# Patient Record
Sex: Male | Born: 1940
Health system: Southern US, Community
[De-identification: ages and names within clinical notes are randomized; demographics above are authoritative.]

## PROBLEM LIST (undated history)

## (undated) DIAGNOSIS — I219 Acute myocardial infarction, unspecified: Secondary | ICD-10-CM

## (undated) DIAGNOSIS — I639 Cerebral infarction, unspecified: Secondary | ICD-10-CM

## (undated) DIAGNOSIS — M109 Gout, unspecified: Secondary | ICD-10-CM

## (undated) DIAGNOSIS — N189 Chronic kidney disease, unspecified: Secondary | ICD-10-CM

## (undated) DIAGNOSIS — N3281 Overactive bladder: Secondary | ICD-10-CM

## (undated) DIAGNOSIS — G4730 Sleep apnea, unspecified: Secondary | ICD-10-CM

## (undated) DIAGNOSIS — I2510 Atherosclerotic heart disease of native coronary artery without angina pectoris: Secondary | ICD-10-CM

## (undated) DIAGNOSIS — I10 Essential (primary) hypertension: Secondary | ICD-10-CM

## (undated) DIAGNOSIS — E119 Type 2 diabetes mellitus without complications: Secondary | ICD-10-CM

## (undated) DIAGNOSIS — E785 Hyperlipidemia, unspecified: Secondary | ICD-10-CM

## (undated) DIAGNOSIS — M1990 Unspecified osteoarthritis, unspecified site: Secondary | ICD-10-CM

## (undated) DIAGNOSIS — I4892 Unspecified atrial flutter: Secondary | ICD-10-CM

## (undated) DIAGNOSIS — T8859XA Other complications of anesthesia, initial encounter: Secondary | ICD-10-CM

## (undated) DIAGNOSIS — F419 Anxiety disorder, unspecified: Secondary | ICD-10-CM

## (undated) DIAGNOSIS — C801 Malignant (primary) neoplasm, unspecified: Secondary | ICD-10-CM

## (undated) DIAGNOSIS — K598 Other specified functional intestinal disorders: Secondary | ICD-10-CM

## (undated) HISTORY — DX: Other specified functional intestinal disorders: K59.8

## (undated) HISTORY — PX: COLOSTOMY: SHX63

## (undated) HISTORY — DX: Overactive bladder: N32.81

## (undated) HISTORY — DX: Cerebral infarction, unspecified: I63.9

## (undated) HISTORY — DX: Essential (primary) hypertension: I10

## (undated) HISTORY — DX: Type 2 diabetes mellitus without complications: E11.9

## (undated) HISTORY — PX: CHOLECYSTECTOMY: SHX55

## (undated) HISTORY — DX: Hyperlipidemia, unspecified: E78.5

## (undated) HISTORY — DX: Sleep apnea, unspecified: G47.30

## (undated) HISTORY — DX: Unspecified osteoarthritis, unspecified site: M19.90

## (undated) HISTORY — DX: Gout, unspecified: M10.9

## (undated) HISTORY — DX: Malignant (primary) neoplasm, unspecified: C80.1

## (undated) HISTORY — DX: Anxiety disorder, unspecified: F41.9

## (undated) HISTORY — PX: PROSTATE SURGERY: SHX751

---

## 2016-03-05 DIAGNOSIS — M7061 Trochanteric bursitis, right hip: Secondary | ICD-10-CM | POA: Diagnosis not present

## 2016-03-05 DIAGNOSIS — M25551 Pain in right hip: Secondary | ICD-10-CM | POA: Diagnosis not present

## 2016-03-05 DIAGNOSIS — M25552 Pain in left hip: Secondary | ICD-10-CM | POA: Diagnosis not present

## 2016-03-05 DIAGNOSIS — Z96641 Presence of right artificial hip joint: Secondary | ICD-10-CM | POA: Diagnosis not present

## 2016-03-25 DIAGNOSIS — R05 Cough: Secondary | ICD-10-CM | POA: Diagnosis not present

## 2016-03-25 DIAGNOSIS — J209 Acute bronchitis, unspecified: Secondary | ICD-10-CM | POA: Diagnosis not present

## 2016-03-27 DIAGNOSIS — M25551 Pain in right hip: Secondary | ICD-10-CM | POA: Diagnosis not present

## 2016-03-27 DIAGNOSIS — M7062 Trochanteric bursitis, left hip: Secondary | ICD-10-CM | POA: Diagnosis not present

## 2016-03-27 DIAGNOSIS — M5441 Lumbago with sciatica, right side: Secondary | ICD-10-CM | POA: Diagnosis not present

## 2016-03-27 DIAGNOSIS — M25552 Pain in left hip: Secondary | ICD-10-CM | POA: Diagnosis not present

## 2016-04-10 DIAGNOSIS — E782 Mixed hyperlipidemia: Secondary | ICD-10-CM | POA: Diagnosis not present

## 2016-04-10 DIAGNOSIS — Z23 Encounter for immunization: Secondary | ICD-10-CM | POA: Diagnosis not present

## 2016-04-10 DIAGNOSIS — E119 Type 2 diabetes mellitus without complications: Secondary | ICD-10-CM | POA: Diagnosis not present

## 2016-04-10 DIAGNOSIS — M25552 Pain in left hip: Secondary | ICD-10-CM | POA: Diagnosis not present

## 2016-04-10 DIAGNOSIS — I10 Essential (primary) hypertension: Secondary | ICD-10-CM | POA: Diagnosis not present

## 2016-04-16 DIAGNOSIS — H2000 Unspecified acute and subacute iridocyclitis: Secondary | ICD-10-CM | POA: Diagnosis not present

## 2016-04-24 DIAGNOSIS — H2000 Unspecified acute and subacute iridocyclitis: Secondary | ICD-10-CM | POA: Diagnosis not present

## 2016-05-15 DIAGNOSIS — H2000 Unspecified acute and subacute iridocyclitis: Secondary | ICD-10-CM | POA: Diagnosis not present

## 2016-05-27 DIAGNOSIS — M1990 Unspecified osteoarthritis, unspecified site: Secondary | ICD-10-CM | POA: Diagnosis not present

## 2016-05-27 DIAGNOSIS — R609 Edema, unspecified: Secondary | ICD-10-CM | POA: Diagnosis not present

## 2016-05-28 DIAGNOSIS — R5383 Other fatigue: Secondary | ICD-10-CM | POA: Diagnosis not present

## 2016-05-28 DIAGNOSIS — D649 Anemia, unspecified: Secondary | ICD-10-CM | POA: Diagnosis not present

## 2016-05-29 DIAGNOSIS — H2000 Unspecified acute and subacute iridocyclitis: Secondary | ICD-10-CM | POA: Diagnosis not present

## 2016-06-04 DIAGNOSIS — M7061 Trochanteric bursitis, right hip: Secondary | ICD-10-CM | POA: Diagnosis not present

## 2016-06-04 DIAGNOSIS — M5441 Lumbago with sciatica, right side: Secondary | ICD-10-CM | POA: Diagnosis not present

## 2016-06-04 DIAGNOSIS — M25551 Pain in right hip: Secondary | ICD-10-CM | POA: Diagnosis not present

## 2016-06-04 DIAGNOSIS — G894 Chronic pain syndrome: Secondary | ICD-10-CM | POA: Diagnosis not present

## 2016-06-04 DIAGNOSIS — Z981 Arthrodesis status: Secondary | ICD-10-CM | POA: Diagnosis not present

## 2016-09-08 DIAGNOSIS — Z96641 Presence of right artificial hip joint: Secondary | ICD-10-CM | POA: Diagnosis not present

## 2016-09-08 DIAGNOSIS — M7062 Trochanteric bursitis, left hip: Secondary | ICD-10-CM | POA: Diagnosis not present

## 2016-09-08 DIAGNOSIS — Z96642 Presence of left artificial hip joint: Secondary | ICD-10-CM | POA: Diagnosis not present

## 2016-09-08 DIAGNOSIS — M25551 Pain in right hip: Secondary | ICD-10-CM | POA: Diagnosis not present

## 2016-09-08 DIAGNOSIS — M545 Low back pain: Secondary | ICD-10-CM | POA: Diagnosis not present

## 2016-09-08 DIAGNOSIS — G894 Chronic pain syndrome: Secondary | ICD-10-CM | POA: Diagnosis not present

## 2016-09-08 DIAGNOSIS — M25552 Pain in left hip: Secondary | ICD-10-CM | POA: Diagnosis not present

## 2016-09-08 DIAGNOSIS — M7061 Trochanteric bursitis, right hip: Secondary | ICD-10-CM | POA: Diagnosis not present

## 2016-10-10 DIAGNOSIS — M25552 Pain in left hip: Secondary | ICD-10-CM | POA: Diagnosis not present

## 2016-10-10 DIAGNOSIS — Z6830 Body mass index (BMI) 30.0-30.9, adult: Secondary | ICD-10-CM | POA: Diagnosis not present

## 2016-10-10 DIAGNOSIS — Z79899 Other long term (current) drug therapy: Secondary | ICD-10-CM | POA: Diagnosis not present

## 2016-10-10 DIAGNOSIS — M25551 Pain in right hip: Secondary | ICD-10-CM | POA: Diagnosis not present

## 2016-10-10 DIAGNOSIS — Z0000 Encounter for general adult medical examination without abnormal findings: Secondary | ICD-10-CM | POA: Diagnosis not present

## 2016-10-10 DIAGNOSIS — E876 Hypokalemia: Secondary | ICD-10-CM | POA: Diagnosis not present

## 2016-10-10 DIAGNOSIS — K219 Gastro-esophageal reflux disease without esophagitis: Secondary | ICD-10-CM | POA: Diagnosis not present

## 2016-10-10 DIAGNOSIS — M109 Gout, unspecified: Secondary | ICD-10-CM | POA: Diagnosis not present

## 2016-10-10 DIAGNOSIS — D508 Other iron deficiency anemias: Secondary | ICD-10-CM | POA: Diagnosis not present

## 2016-10-10 DIAGNOSIS — I10 Essential (primary) hypertension: Secondary | ICD-10-CM | POA: Diagnosis not present

## 2016-10-10 DIAGNOSIS — N3281 Overactive bladder: Secondary | ICD-10-CM | POA: Diagnosis not present

## 2016-10-10 DIAGNOSIS — Z125 Encounter for screening for malignant neoplasm of prostate: Secondary | ICD-10-CM | POA: Diagnosis not present

## 2016-10-10 DIAGNOSIS — E119 Type 2 diabetes mellitus without complications: Secondary | ICD-10-CM | POA: Diagnosis not present

## 2016-10-20 DIAGNOSIS — E785 Hyperlipidemia, unspecified: Secondary | ICD-10-CM | POA: Diagnosis not present

## 2016-10-20 DIAGNOSIS — R0602 Shortness of breath: Secondary | ICD-10-CM | POA: Diagnosis not present

## 2016-10-20 DIAGNOSIS — I10 Essential (primary) hypertension: Secondary | ICD-10-CM | POA: Diagnosis not present

## 2016-10-20 DIAGNOSIS — I6789 Other cerebrovascular disease: Secondary | ICD-10-CM | POA: Diagnosis not present

## 2016-11-04 DIAGNOSIS — Z933 Colostomy status: Secondary | ICD-10-CM | POA: Diagnosis not present

## 2016-11-04 DIAGNOSIS — Z1211 Encounter for screening for malignant neoplasm of colon: Secondary | ICD-10-CM | POA: Diagnosis not present

## 2016-11-04 DIAGNOSIS — R634 Abnormal weight loss: Secondary | ICD-10-CM | POA: Diagnosis not present

## 2016-11-04 DIAGNOSIS — I519 Heart disease, unspecified: Secondary | ICD-10-CM | POA: Diagnosis not present

## 2016-11-04 DIAGNOSIS — K219 Gastro-esophageal reflux disease without esophagitis: Secondary | ICD-10-CM | POA: Diagnosis not present

## 2016-11-04 DIAGNOSIS — Z8673 Personal history of transient ischemic attack (TIA), and cerebral infarction without residual deficits: Secondary | ICD-10-CM | POA: Diagnosis not present

## 2016-11-11 DIAGNOSIS — E785 Hyperlipidemia, unspecified: Secondary | ICD-10-CM | POA: Diagnosis not present

## 2016-11-11 DIAGNOSIS — Z01810 Encounter for preprocedural cardiovascular examination: Secondary | ICD-10-CM | POA: Diagnosis not present

## 2016-11-11 DIAGNOSIS — I6789 Other cerebrovascular disease: Secondary | ICD-10-CM | POA: Diagnosis not present

## 2016-11-11 DIAGNOSIS — I10 Essential (primary) hypertension: Secondary | ICD-10-CM | POA: Diagnosis not present

## 2016-11-18 DIAGNOSIS — Z79891 Long term (current) use of opiate analgesic: Secondary | ICD-10-CM | POA: Diagnosis not present

## 2016-11-25 DIAGNOSIS — I10 Essential (primary) hypertension: Secondary | ICD-10-CM | POA: Diagnosis not present

## 2016-11-25 DIAGNOSIS — E119 Type 2 diabetes mellitus without complications: Secondary | ICD-10-CM | POA: Diagnosis not present

## 2016-11-26 DIAGNOSIS — M4727 Other spondylosis with radiculopathy, lumbosacral region: Secondary | ICD-10-CM | POA: Diagnosis not present

## 2016-11-26 DIAGNOSIS — M48061 Spinal stenosis, lumbar region without neurogenic claudication: Secondary | ICD-10-CM | POA: Diagnosis not present

## 2016-11-26 DIAGNOSIS — M4316 Spondylolisthesis, lumbar region: Secondary | ICD-10-CM | POA: Diagnosis not present

## 2016-11-26 DIAGNOSIS — M4807 Spinal stenosis, lumbosacral region: Secondary | ICD-10-CM | POA: Diagnosis not present

## 2016-11-26 DIAGNOSIS — M5416 Radiculopathy, lumbar region: Secondary | ICD-10-CM | POA: Diagnosis not present

## 2016-11-26 DIAGNOSIS — M5127 Other intervertebral disc displacement, lumbosacral region: Secondary | ICD-10-CM | POA: Diagnosis not present

## 2016-11-28 DIAGNOSIS — Z23 Encounter for immunization: Secondary | ICD-10-CM | POA: Diagnosis not present

## 2016-12-09 DIAGNOSIS — C61 Malignant neoplasm of prostate: Secondary | ICD-10-CM | POA: Diagnosis not present

## 2016-12-09 DIAGNOSIS — M549 Dorsalgia, unspecified: Secondary | ICD-10-CM | POA: Diagnosis not present

## 2016-12-09 DIAGNOSIS — E119 Type 2 diabetes mellitus without complications: Secondary | ICD-10-CM | POA: Diagnosis not present

## 2016-12-09 DIAGNOSIS — M961 Postlaminectomy syndrome, not elsewhere classified: Secondary | ICD-10-CM | POA: Diagnosis not present

## 2016-12-09 DIAGNOSIS — M5416 Radiculopathy, lumbar region: Secondary | ICD-10-CM | POA: Diagnosis not present

## 2016-12-09 DIAGNOSIS — R030 Elevated blood-pressure reading, without diagnosis of hypertension: Secondary | ICD-10-CM | POA: Diagnosis not present

## 2016-12-11 DIAGNOSIS — I10 Essential (primary) hypertension: Secondary | ICD-10-CM | POA: Diagnosis not present

## 2016-12-11 DIAGNOSIS — E119 Type 2 diabetes mellitus without complications: Secondary | ICD-10-CM | POA: Diagnosis not present

## 2017-01-07 DIAGNOSIS — M4800 Spinal stenosis, site unspecified: Secondary | ICD-10-CM | POA: Diagnosis not present

## 2017-01-07 DIAGNOSIS — M47816 Spondylosis without myelopathy or radiculopathy, lumbar region: Secondary | ICD-10-CM | POA: Diagnosis not present

## 2017-01-07 DIAGNOSIS — Z981 Arthrodesis status: Secondary | ICD-10-CM | POA: Diagnosis not present

## 2017-01-12 DIAGNOSIS — E119 Type 2 diabetes mellitus without complications: Secondary | ICD-10-CM | POA: Diagnosis not present

## 2017-01-12 DIAGNOSIS — I10 Essential (primary) hypertension: Secondary | ICD-10-CM | POA: Diagnosis not present

## 2017-01-22 DIAGNOSIS — M25512 Pain in left shoulder: Secondary | ICD-10-CM | POA: Diagnosis not present

## 2017-01-22 DIAGNOSIS — F419 Anxiety disorder, unspecified: Secondary | ICD-10-CM | POA: Diagnosis not present

## 2017-01-22 DIAGNOSIS — R350 Frequency of micturition: Secondary | ICD-10-CM | POA: Diagnosis not present

## 2017-01-22 DIAGNOSIS — Z6830 Body mass index (BMI) 30.0-30.9, adult: Secondary | ICD-10-CM | POA: Diagnosis not present

## 2017-01-22 DIAGNOSIS — N3281 Overactive bladder: Secondary | ICD-10-CM | POA: Diagnosis not present

## 2017-01-22 DIAGNOSIS — M25531 Pain in right wrist: Secondary | ICD-10-CM | POA: Diagnosis not present

## 2017-01-22 DIAGNOSIS — M79602 Pain in left arm: Secondary | ICD-10-CM | POA: Diagnosis not present

## 2017-01-22 DIAGNOSIS — Y92410 Unspecified street and highway as the place of occurrence of the external cause: Secondary | ICD-10-CM | POA: Diagnosis not present

## 2017-01-23 DIAGNOSIS — E785 Hyperlipidemia, unspecified: Secondary | ICD-10-CM | POA: Diagnosis not present

## 2017-01-23 DIAGNOSIS — I10 Essential (primary) hypertension: Secondary | ICD-10-CM | POA: Diagnosis not present

## 2017-01-23 DIAGNOSIS — Z01810 Encounter for preprocedural cardiovascular examination: Secondary | ICD-10-CM | POA: Diagnosis not present

## 2017-01-23 DIAGNOSIS — I6789 Other cerebrovascular disease: Secondary | ICD-10-CM | POA: Diagnosis not present

## 2017-02-18 DIAGNOSIS — E119 Type 2 diabetes mellitus without complications: Secondary | ICD-10-CM | POA: Diagnosis not present

## 2017-02-18 DIAGNOSIS — I10 Essential (primary) hypertension: Secondary | ICD-10-CM | POA: Diagnosis not present

## 2017-03-17 DIAGNOSIS — R030 Elevated blood-pressure reading, without diagnosis of hypertension: Secondary | ICD-10-CM | POA: Diagnosis not present

## 2017-03-17 DIAGNOSIS — M47816 Spondylosis without myelopathy or radiculopathy, lumbar region: Secondary | ICD-10-CM | POA: Diagnosis not present

## 2017-03-17 DIAGNOSIS — E119 Type 2 diabetes mellitus without complications: Secondary | ICD-10-CM | POA: Diagnosis not present

## 2017-03-17 DIAGNOSIS — C61 Malignant neoplasm of prostate: Secondary | ICD-10-CM | POA: Diagnosis not present

## 2017-04-01 DIAGNOSIS — E119 Type 2 diabetes mellitus without complications: Secondary | ICD-10-CM | POA: Diagnosis not present

## 2017-04-01 DIAGNOSIS — I10 Essential (primary) hypertension: Secondary | ICD-10-CM | POA: Diagnosis not present

## 2017-04-02 DIAGNOSIS — M1712 Unilateral primary osteoarthritis, left knee: Secondary | ICD-10-CM | POA: Diagnosis not present

## 2017-04-02 DIAGNOSIS — M25561 Pain in right knee: Secondary | ICD-10-CM | POA: Diagnosis not present

## 2017-04-02 DIAGNOSIS — M47816 Spondylosis without myelopathy or radiculopathy, lumbar region: Secondary | ICD-10-CM | POA: Diagnosis not present

## 2017-04-02 DIAGNOSIS — I10 Essential (primary) hypertension: Secondary | ICD-10-CM | POA: Diagnosis not present

## 2017-04-02 DIAGNOSIS — E119 Type 2 diabetes mellitus without complications: Secondary | ICD-10-CM | POA: Diagnosis not present

## 2017-04-02 DIAGNOSIS — M1990 Unspecified osteoarthritis, unspecified site: Secondary | ICD-10-CM | POA: Diagnosis not present

## 2017-04-02 DIAGNOSIS — M25562 Pain in left knee: Secondary | ICD-10-CM | POA: Diagnosis not present

## 2017-04-16 DIAGNOSIS — E119 Type 2 diabetes mellitus without complications: Secondary | ICD-10-CM | POA: Diagnosis not present

## 2017-04-16 DIAGNOSIS — M1990 Unspecified osteoarthritis, unspecified site: Secondary | ICD-10-CM | POA: Diagnosis not present

## 2017-04-16 DIAGNOSIS — M47817 Spondylosis without myelopathy or radiculopathy, lumbosacral region: Secondary | ICD-10-CM | POA: Diagnosis not present

## 2017-04-16 DIAGNOSIS — I639 Cerebral infarction, unspecified: Secondary | ICD-10-CM | POA: Diagnosis not present

## 2017-04-16 DIAGNOSIS — M47816 Spondylosis without myelopathy or radiculopathy, lumbar region: Secondary | ICD-10-CM | POA: Diagnosis not present

## 2017-04-17 DIAGNOSIS — M1A09X0 Idiopathic chronic gout, multiple sites, without tophus (tophi): Secondary | ICD-10-CM | POA: Diagnosis not present

## 2017-04-17 DIAGNOSIS — K219 Gastro-esophageal reflux disease without esophagitis: Secondary | ICD-10-CM | POA: Diagnosis not present

## 2017-04-17 DIAGNOSIS — I7090 Unspecified atherosclerosis: Secondary | ICD-10-CM | POA: Diagnosis not present

## 2017-04-17 DIAGNOSIS — E782 Mixed hyperlipidemia: Secondary | ICD-10-CM | POA: Diagnosis not present

## 2017-04-17 DIAGNOSIS — Z7902 Long term (current) use of antithrombotics/antiplatelets: Secondary | ICD-10-CM | POA: Diagnosis not present

## 2017-04-17 DIAGNOSIS — D508 Other iron deficiency anemias: Secondary | ICD-10-CM | POA: Diagnosis not present

## 2017-04-17 DIAGNOSIS — G629 Polyneuropathy, unspecified: Secondary | ICD-10-CM | POA: Diagnosis not present

## 2017-04-17 DIAGNOSIS — Z6830 Body mass index (BMI) 30.0-30.9, adult: Secondary | ICD-10-CM | POA: Diagnosis not present

## 2017-04-17 DIAGNOSIS — I25118 Atherosclerotic heart disease of native coronary artery with other forms of angina pectoris: Secondary | ICD-10-CM | POA: Diagnosis not present

## 2017-04-17 DIAGNOSIS — E119 Type 2 diabetes mellitus without complications: Secondary | ICD-10-CM | POA: Diagnosis not present

## 2017-04-17 DIAGNOSIS — I10 Essential (primary) hypertension: Secondary | ICD-10-CM | POA: Diagnosis not present

## 2017-04-17 DIAGNOSIS — N3281 Overactive bladder: Secondary | ICD-10-CM | POA: Diagnosis not present

## 2017-04-22 DIAGNOSIS — E119 Type 2 diabetes mellitus without complications: Secondary | ICD-10-CM | POA: Diagnosis not present

## 2017-04-22 DIAGNOSIS — I10 Essential (primary) hypertension: Secondary | ICD-10-CM | POA: Diagnosis not present

## 2017-04-24 DIAGNOSIS — E1165 Type 2 diabetes mellitus with hyperglycemia: Secondary | ICD-10-CM | POA: Diagnosis not present

## 2017-04-24 DIAGNOSIS — I10 Essential (primary) hypertension: Secondary | ICD-10-CM | POA: Diagnosis not present

## 2017-04-24 DIAGNOSIS — I25118 Atherosclerotic heart disease of native coronary artery with other forms of angina pectoris: Secondary | ICD-10-CM | POA: Diagnosis not present

## 2017-04-24 DIAGNOSIS — D508 Other iron deficiency anemias: Secondary | ICD-10-CM | POA: Diagnosis not present

## 2017-04-24 DIAGNOSIS — E1142 Type 2 diabetes mellitus with diabetic polyneuropathy: Secondary | ICD-10-CM | POA: Diagnosis not present

## 2017-04-24 DIAGNOSIS — N182 Chronic kidney disease, stage 2 (mild): Secondary | ICD-10-CM | POA: Diagnosis not present

## 2017-04-24 DIAGNOSIS — Z13820 Encounter for screening for osteoporosis: Secondary | ICD-10-CM | POA: Diagnosis not present

## 2017-04-24 DIAGNOSIS — Z6831 Body mass index (BMI) 31.0-31.9, adult: Secondary | ICD-10-CM | POA: Diagnosis not present

## 2017-04-24 DIAGNOSIS — M109 Gout, unspecified: Secondary | ICD-10-CM | POA: Diagnosis not present

## 2017-04-24 DIAGNOSIS — E1159 Type 2 diabetes mellitus with other circulatory complications: Secondary | ICD-10-CM | POA: Diagnosis not present

## 2017-04-24 DIAGNOSIS — F419 Anxiety disorder, unspecified: Secondary | ICD-10-CM | POA: Diagnosis not present

## 2017-04-24 DIAGNOSIS — E1122 Type 2 diabetes mellitus with diabetic chronic kidney disease: Secondary | ICD-10-CM | POA: Diagnosis not present

## 2017-05-05 DIAGNOSIS — E1165 Type 2 diabetes mellitus with hyperglycemia: Secondary | ICD-10-CM | POA: Diagnosis not present

## 2017-05-06 DIAGNOSIS — Z96643 Presence of artificial hip joint, bilateral: Secondary | ICD-10-CM | POA: Diagnosis not present

## 2017-05-06 DIAGNOSIS — M25551 Pain in right hip: Secondary | ICD-10-CM | POA: Diagnosis not present

## 2017-05-06 DIAGNOSIS — M25552 Pain in left hip: Secondary | ICD-10-CM | POA: Diagnosis not present

## 2017-05-15 DIAGNOSIS — I10 Essential (primary) hypertension: Secondary | ICD-10-CM | POA: Diagnosis not present

## 2017-05-15 DIAGNOSIS — E119 Type 2 diabetes mellitus without complications: Secondary | ICD-10-CM | POA: Diagnosis not present

## 2017-05-20 DIAGNOSIS — M79642 Pain in left hand: Secondary | ICD-10-CM | POA: Diagnosis not present

## 2017-05-20 DIAGNOSIS — I7090 Unspecified atherosclerosis: Secondary | ICD-10-CM | POA: Diagnosis not present

## 2017-05-20 DIAGNOSIS — Z6831 Body mass index (BMI) 31.0-31.9, adult: Secondary | ICD-10-CM | POA: Diagnosis not present

## 2017-05-20 DIAGNOSIS — M79605 Pain in left leg: Secondary | ICD-10-CM | POA: Diagnosis not present

## 2017-05-20 DIAGNOSIS — Z96643 Presence of artificial hip joint, bilateral: Secondary | ICD-10-CM | POA: Diagnosis not present

## 2017-05-20 DIAGNOSIS — M79604 Pain in right leg: Secondary | ICD-10-CM | POA: Diagnosis not present

## 2017-05-20 DIAGNOSIS — R2243 Localized swelling, mass and lump, lower limb, bilateral: Secondary | ICD-10-CM | POA: Diagnosis not present

## 2017-05-20 DIAGNOSIS — E1122 Type 2 diabetes mellitus with diabetic chronic kidney disease: Secondary | ICD-10-CM | POA: Diagnosis not present

## 2017-06-01 DIAGNOSIS — R2243 Localized swelling, mass and lump, lower limb, bilateral: Secondary | ICD-10-CM | POA: Diagnosis not present

## 2017-06-05 DIAGNOSIS — M79662 Pain in left lower leg: Secondary | ICD-10-CM | POA: Diagnosis not present

## 2017-06-05 DIAGNOSIS — R2243 Localized swelling, mass and lump, lower limb, bilateral: Secondary | ICD-10-CM | POA: Diagnosis not present

## 2017-06-16 DIAGNOSIS — R600 Localized edema: Secondary | ICD-10-CM | POA: Diagnosis not present

## 2017-06-17 DIAGNOSIS — E119 Type 2 diabetes mellitus without complications: Secondary | ICD-10-CM | POA: Diagnosis not present

## 2017-06-17 DIAGNOSIS — I10 Essential (primary) hypertension: Secondary | ICD-10-CM | POA: Diagnosis not present

## 2017-06-18 DIAGNOSIS — R2243 Localized swelling, mass and lump, lower limb, bilateral: Secondary | ICD-10-CM | POA: Diagnosis not present

## 2017-06-18 DIAGNOSIS — M79662 Pain in left lower leg: Secondary | ICD-10-CM | POA: Diagnosis not present

## 2017-06-18 DIAGNOSIS — I872 Venous insufficiency (chronic) (peripheral): Secondary | ICD-10-CM | POA: Diagnosis not present

## 2017-06-22 ENCOUNTER — Encounter: Payer: Self-pay | Admitting: Physician Assistant

## 2017-06-22 DIAGNOSIS — Z135 Encounter for screening for eye and ear disorders: Secondary | ICD-10-CM | POA: Diagnosis not present

## 2017-06-22 DIAGNOSIS — E119 Type 2 diabetes mellitus without complications: Secondary | ICD-10-CM | POA: Diagnosis not present

## 2017-06-22 DIAGNOSIS — Z13820 Encounter for screening for osteoporosis: Secondary | ICD-10-CM | POA: Diagnosis not present

## 2017-06-30 DIAGNOSIS — M1711 Unilateral primary osteoarthritis, right knee: Secondary | ICD-10-CM | POA: Diagnosis not present

## 2017-07-01 DIAGNOSIS — Z96642 Presence of left artificial hip joint: Secondary | ICD-10-CM | POA: Diagnosis not present

## 2017-07-01 DIAGNOSIS — M25561 Pain in right knee: Secondary | ICD-10-CM | POA: Diagnosis not present

## 2017-07-01 DIAGNOSIS — M25562 Pain in left knee: Secondary | ICD-10-CM | POA: Diagnosis not present

## 2017-07-01 DIAGNOSIS — Z96641 Presence of right artificial hip joint: Secondary | ICD-10-CM | POA: Diagnosis not present

## 2017-07-07 DIAGNOSIS — M1712 Unilateral primary osteoarthritis, left knee: Secondary | ICD-10-CM | POA: Diagnosis not present

## 2017-07-14 DIAGNOSIS — M1711 Unilateral primary osteoarthritis, right knee: Secondary | ICD-10-CM | POA: Diagnosis not present

## 2017-07-21 DIAGNOSIS — E119 Type 2 diabetes mellitus without complications: Secondary | ICD-10-CM | POA: Diagnosis not present

## 2017-07-21 DIAGNOSIS — I10 Essential (primary) hypertension: Secondary | ICD-10-CM | POA: Diagnosis not present

## 2017-07-28 DIAGNOSIS — M1712 Unilateral primary osteoarthritis, left knee: Secondary | ICD-10-CM | POA: Diagnosis not present

## 2017-07-31 DIAGNOSIS — E785 Hyperlipidemia, unspecified: Secondary | ICD-10-CM | POA: Diagnosis not present

## 2017-07-31 DIAGNOSIS — I10 Essential (primary) hypertension: Secondary | ICD-10-CM | POA: Diagnosis not present

## 2017-07-31 DIAGNOSIS — I872 Venous insufficiency (chronic) (peripheral): Secondary | ICD-10-CM | POA: Diagnosis not present

## 2017-07-31 DIAGNOSIS — I6789 Other cerebrovascular disease: Secondary | ICD-10-CM | POA: Diagnosis not present

## 2017-08-04 DIAGNOSIS — M1711 Unilateral primary osteoarthritis, right knee: Secondary | ICD-10-CM | POA: Diagnosis not present

## 2017-08-17 DIAGNOSIS — M1712 Unilateral primary osteoarthritis, left knee: Secondary | ICD-10-CM | POA: Diagnosis not present

## 2017-08-18 DIAGNOSIS — I10 Essential (primary) hypertension: Secondary | ICD-10-CM | POA: Diagnosis not present

## 2017-08-18 DIAGNOSIS — E119 Type 2 diabetes mellitus without complications: Secondary | ICD-10-CM | POA: Diagnosis not present

## 2017-08-24 DIAGNOSIS — M1712 Unilateral primary osteoarthritis, left knee: Secondary | ICD-10-CM | POA: Diagnosis not present

## 2017-08-31 DIAGNOSIS — M1712 Unilateral primary osteoarthritis, left knee: Secondary | ICD-10-CM | POA: Diagnosis not present

## 2017-09-15 DIAGNOSIS — M1712 Unilateral primary osteoarthritis, left knee: Secondary | ICD-10-CM | POA: Diagnosis not present

## 2017-09-22 DIAGNOSIS — M1712 Unilateral primary osteoarthritis, left knee: Secondary | ICD-10-CM | POA: Diagnosis not present

## 2017-09-25 DIAGNOSIS — I10 Essential (primary) hypertension: Secondary | ICD-10-CM | POA: Diagnosis not present

## 2017-09-25 DIAGNOSIS — E119 Type 2 diabetes mellitus without complications: Secondary | ICD-10-CM | POA: Diagnosis not present

## 2017-10-08 DIAGNOSIS — E119 Type 2 diabetes mellitus without complications: Secondary | ICD-10-CM | POA: Diagnosis not present

## 2017-10-08 DIAGNOSIS — M5417 Radiculopathy, lumbosacral region: Secondary | ICD-10-CM | POA: Diagnosis not present

## 2017-10-08 DIAGNOSIS — M5416 Radiculopathy, lumbar region: Secondary | ICD-10-CM | POA: Diagnosis not present

## 2017-10-08 DIAGNOSIS — M961 Postlaminectomy syndrome, not elsewhere classified: Secondary | ICD-10-CM | POA: Diagnosis not present

## 2017-10-14 DIAGNOSIS — Z23 Encounter for immunization: Secondary | ICD-10-CM | POA: Diagnosis not present

## 2017-10-19 DIAGNOSIS — Z6830 Body mass index (BMI) 30.0-30.9, adult: Secondary | ICD-10-CM | POA: Diagnosis not present

## 2017-10-19 DIAGNOSIS — I10 Essential (primary) hypertension: Secondary | ICD-10-CM | POA: Diagnosis not present

## 2017-10-19 DIAGNOSIS — M62830 Muscle spasm of back: Secondary | ICD-10-CM | POA: Diagnosis not present

## 2017-10-19 DIAGNOSIS — Z0000 Encounter for general adult medical examination without abnormal findings: Secondary | ICD-10-CM | POA: Diagnosis not present

## 2017-10-19 DIAGNOSIS — M1A09X0 Idiopathic chronic gout, multiple sites, without tophus (tophi): Secondary | ICD-10-CM | POA: Diagnosis not present

## 2017-10-19 DIAGNOSIS — Z1211 Encounter for screening for malignant neoplasm of colon: Secondary | ICD-10-CM | POA: Diagnosis not present

## 2017-10-19 DIAGNOSIS — G629 Polyneuropathy, unspecified: Secondary | ICD-10-CM | POA: Diagnosis not present

## 2017-10-19 DIAGNOSIS — K219 Gastro-esophageal reflux disease without esophagitis: Secondary | ICD-10-CM | POA: Diagnosis not present

## 2017-10-19 DIAGNOSIS — Z79899 Other long term (current) drug therapy: Secondary | ICD-10-CM | POA: Diagnosis not present

## 2017-10-19 DIAGNOSIS — E1165 Type 2 diabetes mellitus with hyperglycemia: Secondary | ICD-10-CM | POA: Diagnosis not present

## 2017-10-19 DIAGNOSIS — D508 Other iron deficiency anemias: Secondary | ICD-10-CM | POA: Diagnosis not present

## 2017-10-19 DIAGNOSIS — E782 Mixed hyperlipidemia: Secondary | ICD-10-CM | POA: Diagnosis not present

## 2017-10-20 DIAGNOSIS — E669 Obesity, unspecified: Secondary | ICD-10-CM | POA: Diagnosis not present

## 2017-10-20 DIAGNOSIS — M533 Sacrococcygeal disorders, not elsewhere classified: Secondary | ICD-10-CM | POA: Diagnosis not present

## 2017-10-20 DIAGNOSIS — E119 Type 2 diabetes mellitus without complications: Secondary | ICD-10-CM | POA: Diagnosis not present

## 2017-10-20 DIAGNOSIS — M461 Sacroiliitis, not elsewhere classified: Secondary | ICD-10-CM | POA: Diagnosis not present

## 2017-10-30 DIAGNOSIS — I10 Essential (primary) hypertension: Secondary | ICD-10-CM | POA: Diagnosis not present

## 2017-10-30 DIAGNOSIS — E119 Type 2 diabetes mellitus without complications: Secondary | ICD-10-CM | POA: Diagnosis not present

## 2017-11-06 DIAGNOSIS — M47817 Spondylosis without myelopathy or radiculopathy, lumbosacral region: Secondary | ICD-10-CM | POA: Diagnosis not present

## 2017-11-06 DIAGNOSIS — M47816 Spondylosis without myelopathy or radiculopathy, lumbar region: Secondary | ICD-10-CM | POA: Diagnosis not present

## 2017-11-20 DIAGNOSIS — M47816 Spondylosis without myelopathy or radiculopathy, lumbar region: Secondary | ICD-10-CM | POA: Diagnosis not present

## 2017-11-20 DIAGNOSIS — M47817 Spondylosis without myelopathy or radiculopathy, lumbosacral region: Secondary | ICD-10-CM | POA: Diagnosis not present

## 2017-11-26 DIAGNOSIS — E119 Type 2 diabetes mellitus without complications: Secondary | ICD-10-CM | POA: Diagnosis not present

## 2017-11-26 DIAGNOSIS — I10 Essential (primary) hypertension: Secondary | ICD-10-CM | POA: Diagnosis not present

## 2017-12-15 ENCOUNTER — Ambulatory Visit (INDEPENDENT_AMBULATORY_CARE_PROVIDER_SITE_OTHER): Payer: Medicare Other

## 2017-12-15 ENCOUNTER — Encounter: Payer: Self-pay | Admitting: Sports Medicine

## 2017-12-15 ENCOUNTER — Ambulatory Visit (INDEPENDENT_AMBULATORY_CARE_PROVIDER_SITE_OTHER): Payer: Medicare Other | Admitting: Sports Medicine

## 2017-12-15 DIAGNOSIS — M48062 Spinal stenosis, lumbar region with neurogenic claudication: Secondary | ICD-10-CM | POA: Insufficient documentation

## 2017-12-15 DIAGNOSIS — I10 Essential (primary) hypertension: Secondary | ICD-10-CM | POA: Diagnosis not present

## 2017-12-15 DIAGNOSIS — Z8673 Personal history of transient ischemic attack (TIA), and cerebral infarction without residual deficits: Secondary | ICD-10-CM

## 2017-12-15 DIAGNOSIS — M109 Gout, unspecified: Secondary | ICD-10-CM | POA: Diagnosis not present

## 2017-12-15 DIAGNOSIS — E785 Hyperlipidemia, unspecified: Secondary | ICD-10-CM | POA: Diagnosis not present

## 2017-12-15 DIAGNOSIS — M545 Low back pain: Secondary | ICD-10-CM | POA: Diagnosis not present

## 2017-12-15 DIAGNOSIS — Z933 Colostomy status: Secondary | ICD-10-CM

## 2017-12-15 DIAGNOSIS — M48061 Spinal stenosis, lumbar region without neurogenic claudication: Secondary | ICD-10-CM | POA: Insufficient documentation

## 2017-12-15 DIAGNOSIS — E1159 Type 2 diabetes mellitus with other circulatory complications: Secondary | ICD-10-CM | POA: Insufficient documentation

## 2017-12-15 DIAGNOSIS — Z932 Ileostomy status: Secondary | ICD-10-CM | POA: Insufficient documentation

## 2017-12-15 DIAGNOSIS — I152 Hypertension secondary to endocrine disorders: Secondary | ICD-10-CM | POA: Insufficient documentation

## 2017-12-15 MED ORDER — PREDNISONE 50 MG PO TABS: ORAL_TABLET | ORAL | 0 refills | Status: DC

## 2017-12-15 MED ORDER — DIAZEPAM 5 MG PO TABS: ORAL_TABLET | ORAL | 0 refills | Status: DC

## 2017-12-15 NOTE — Assessment & Plan Note (Signed)
Referral to general surgery to discuss colostomy reversal. It sounds that this was due to diverticulitis with obstruction.

## 2017-12-15 NOTE — Progress Notes (Addendum)
Subjective:    CC: New patient leg weakness  HPI:  This is a pleasant 77 year old male, he has a very extensive [place], starting with an L4-L5 posterior fusion.  He did well but had progressive weakness, ultimately a new MRI showed severe stenosis from L2-L4.  I do not have any records of what procedure was performed but he sounds like he is continuing to have progressive weakness in his legs.  He has also had L2-L5 facet medial branch blocks with good temporary relief each time but has never proceeded with RFA.  Colostomy: History of diverticulitis with what sounds to be obstruction and thus colostomy, he has moved out of state from his original surgeon and needs a new general surgeon to discuss colostomy reversal.  I reviewed the past medical history, family history, social history, surgical history, and allergies today and no changes were needed.  Please see the problem list section below in epic for further details.  Past Medical History: Past Medical History:  Diagnosis Date  . Arthritis   . Cancer ([place])   . Diabetes mellitus without complication ([place])   . Gout   . Hyperlipidemia   . Hypertension   . Overactive bladder   . Sleep apnea   . Stroke [facility])    Past Surgical History: Past Surgical History:  Procedure Laterality Date  . CHOLECYSTECTOMY    . COLOSTOMY    . PROSTATE SURGERY     Social History: Social History   Socioeconomic History  . Marital status: Married    Spouse name: Not on file  . Number of children: Not on file  . Years of education: Not on file  . Highest education level: Not on file  Occupational History  . Not on file  Social Needs  . Financial resource strain: Not on file  . Food insecurity:    Worry: Not on file    Inability: Not on file  . Transportation needs:    Medical: Not on file    Non-medical: Not on file  Tobacco Use  . Smoking status: Former Research scientist (life sciences)  . Smokeless tobacco: Never Used  Substance and  Sexual Activity  . Alcohol use: Not Currently  . Drug use: Never  . Sexual activity: Not Currently  Lifestyle  . Physical activity:    Days per week: Not on file    Minutes per session: Not on file  . Stress: Not on file  Relationships  . Social connections:    Talks on phone: Not on file    Gets together: Not on file    Attends religious service: Not on file    Active member of club or organization: Not on file    Attends meetings of clubs or organizations: Not on file    Relationship status: Not on file  Other Topics Concern  . Not on file  Social History Narrative  . Not on file   Family History: Family History  Family history unknown: Yes   Allergies: Allergies  Allergen Reactions  . Aspirin Swelling  . Shellfish Allergy    Medications: See med rec.  Review of Systems: No headache, visual changes, nausea, vomiting, diarrhea, constipation, dizziness, abdominal pain, skin rash, fevers, chills, night sweats, swollen lymph nodes, weight loss, chest pain, body aches, joint swelling, muscle aches, shortness of breath, mood changes, visual or auditory hallucinations.  Objective:    General: Well Developed, well nourished, and in no acute distress.  Neuro: Alert and oriented x3, extra-ocular muscles  intact, sensation grossly intact.  HEENT: Normocephalic, atraumatic, pupils equal round reactive to light, neck supple, no masses, no lymphadenopathy, thyroid nonpalpable.  Skin: Warm and dry, no rashes noted.  Cardiac: Regular rate and rhythm, no murmurs rubs or gallops.  Respiratory: Clear to auscultation bilaterally. Not using accessory muscles, speaking in full sentences.  Abdominal: Soft, nontender, nondistended, positive bowel sounds, no masses, no organomegaly.  Musculoskeletal: Shoulder, elbow, wrist, hip, knee, ankle stable, and with full range of motion.  Weakness in the legs bilaterally.  Impression and Recommendations:    The patient was counselled, risk factors  were discussed, anticipatory guidance given.  Colostomy status ([place]) Referral to general surgery to discuss colostomy reversal. It sounds that this was due to diverticulitis with obstruction.  Lumbar spinal stenosis Severe lumbar spinal stenosis, the only records I have discussed an L4-L5 posterior lumbar fusion as well as severe central stenosis from L2-L4. He has also had bilateral L2-L5 medial branch blocks with good temporary relief but has not yet proceeded for radiofrequency ablation. We will continue 5 days of prednisone, continue walker, adding a new MRI with and without contrast for further evaluation of his spinal anatomy. I do anticipate facet radiofrequency ablation and referral for surgical decompression of the spinal stenosis. Continue tramadol for pain, I can refill this when needed, he should use MiraLAX to prevent the constipation that can be associated with it.  Patient requesting Valium for preprocedural anxiolysis.  Severe spinal stenosis at L2-L3 and L3-L4, considering weakness in the legs I do think he needs surgical intervention, referral to Dr. Lynann Bologna.  Gout Rechecking uric acid levels  History of CVA (cerebrovascular accident) Currently on Plavix, further evaluation of medical problems when he establishes with Nelson Chimes, PA-C. ___________________________________________ Gwen Her. Dianah Field, M.D., ABFM., CAQSM. Primary Care and Sports Medicine [facility]  Adjunct Professor of [place] [employer]

## 2017-12-15 NOTE — Addendum Note (Signed)
Addended by: Silverio Decamp on: 12/15/2017 04:31 PM   Modules accepted: Orders

## 2017-12-15 NOTE — Assessment & Plan Note (Signed)
Currently on Plavix, further evaluation of medical problems when he establishes with Nelson Chimes, PA-C.

## 2017-12-15 NOTE — Assessment & Plan Note (Signed)
Rechecking uric acid levels. 

## 2017-12-15 NOTE — Assessment & Plan Note (Addendum)
Severe lumbar spinal stenosis, the only records I have discussed an L4-L5 posterior lumbar fusion as well as severe central stenosis from L2-L4. He has also had bilateral L2-L5 medial branch blocks with good temporary relief but has not yet proceeded for radiofrequency ablation. We will continue 5 days of prednisone, continue walker, adding a new MRI with and without contrast for further evaluation of his spinal anatomy. I do anticipate facet radiofrequency ablation and referral for surgical decompression of the spinal stenosis. Continue tramadol for pain, I can refill this when needed, he should use MiraLAX to prevent the constipation that can be associated with it.  Patient requesting Valium for preprocedural anxiolysis.  Severe spinal stenosis at L2-L3 and L3-L4, considering weakness in the legs I do think he needs surgical intervention, referral to Dr. Lynann Bologna.

## 2017-12-16 LAB — CBC
HCT: 36.8 % — ABNORMAL LOW (ref 38.5–50.0)
Hemoglobin: 11.4 g/dL — ABNORMAL LOW (ref 13.2–17.1)
MCH: 23.9 pg — ABNORMAL LOW (ref 27.0–33.0)
MCHC: 31 g/dL — ABNORMAL LOW (ref 32.0–36.0)
MCV: 77.3 fL — ABNORMAL LOW (ref 80.0–100.0)
Platelets: 185 Thousand/uL (ref 140–400)
RBC: 4.76 Million/uL (ref 4.20–5.80)
RDW: 16.5 % — ABNORMAL HIGH (ref 11.0–15.0)
WBC: 8.6 Thousand/uL (ref 3.8–10.8)

## 2017-12-16 LAB — COMPREHENSIVE METABOLIC PANEL
AG Ratio: 1.6 (calc) (ref 1.0–2.5)
Albumin: 4 g/dL (ref 3.6–5.1)
BUN/Creatinine Ratio: 14 (calc) (ref 6–22)
BUN: 21 mg/dL (ref 7–25)
Chloride: 109 mmol/L (ref 98–110)
Creat: 1.5 mg/dL — ABNORMAL HIGH (ref 0.70–1.18)
Globulin: 2.5 g/dL (calc) (ref 1.9–3.7)
Glucose, Bld: 84 mg/dL (ref 65–99)
Potassium: 5.5 mmol/L — ABNORMAL HIGH (ref 3.5–5.3)
Sodium: 139 mmol/L (ref 135–146)
Total Protein: 6.5 g/dL (ref 6.1–8.1)

## 2017-12-16 LAB — COMPREHENSIVE METABOLIC PANEL WITH GFR
ALT: 14 U/L (ref 9–46)
AST: 16 U/L (ref 10–35)
Alkaline phosphatase (APISO): 122 U/L — ABNORMAL HIGH (ref 40–115)
CO2: 24 mmol/L (ref 20–32)
Calcium: 9.6 mg/dL (ref 8.6–10.3)
Total Bilirubin: 0.5 mg/dL (ref 0.2–1.2)

## 2017-12-16 LAB — LIPID PANEL W/REFLEX DIRECT LDL
Cholesterol: 117 mg/dL (ref <200)
HDL: 43 mg/dL (ref >40)
LDL Cholesterol (Calc): 61 mg/dL (calc)
Non-HDL Cholesterol (Calc): 74 mg/dL (calc) (ref <130)
Total CHOL/HDL Ratio: 2.7 (calc) (ref <5.0)
Triglycerides: 58 mg/dL (ref <150)

## 2017-12-16 LAB — TSH: TSH: 2.32 mIU/L (ref 0.40–4.50)

## 2017-12-16 LAB — URIC ACID: Uric Acid, Serum: 3.3 mg/dL — ABNORMAL LOW (ref 4.0–8.0)

## 2017-12-19 ENCOUNTER — Ambulatory Visit (HOSPITAL_BASED_OUTPATIENT_CLINIC_OR_DEPARTMENT_OTHER)
Admission: RE | Admit: 2017-12-19 | Discharge: 2017-12-19 | Disposition: A | Discharge status: Home / Self Care | Payer: Medicare Other | Source: Ambulatory Visit | Attending: Sports Medicine | Admitting: Sports Medicine

## 2017-12-19 DIAGNOSIS — M48061 Spinal stenosis, lumbar region without neurogenic claudication: Secondary | ICD-10-CM | POA: Diagnosis not present

## 2017-12-19 DIAGNOSIS — M48062 Spinal stenosis, lumbar region with neurogenic claudication: Secondary | ICD-10-CM | POA: Diagnosis not present

## 2017-12-19 DIAGNOSIS — C61 Malignant neoplasm of prostate: Secondary | ICD-10-CM | POA: Diagnosis not present

## 2017-12-19 DIAGNOSIS — Z981 Arthrodesis status: Secondary | ICD-10-CM | POA: Diagnosis not present

## 2017-12-19 DIAGNOSIS — M5126 Other intervertebral disc displacement, lumbar region: Secondary | ICD-10-CM | POA: Insufficient documentation

## 2017-12-19 DIAGNOSIS — M5127 Other intervertebral disc displacement, lumbosacral region: Secondary | ICD-10-CM | POA: Diagnosis not present

## 2017-12-19 MED ORDER — GADOBENATE DIMEGLUMINE 529 MG/ML IV SOLN
20.0000 mL | Freq: Once | INTRAVENOUS | Status: AC | PRN
  Administered 2017-12-19: 18 mL via INTRAVENOUS

## 2017-12-21 NOTE — Addendum Note (Signed)
Addended by: Silverio Decamp on: 12/21/2017 08:44 AM   Modules accepted: Orders

## 2017-12-29 ENCOUNTER — Ambulatory Visit (INDEPENDENT_AMBULATORY_CARE_PROVIDER_SITE_OTHER): Payer: Medicare Other | Admitting: Physician Assistant

## 2017-12-29 ENCOUNTER — Encounter: Payer: Self-pay | Admitting: Physician Assistant

## 2017-12-29 VITALS — BP 120/62 | HR 76 | Resp 14 | Ht 69.0 in | Wt 204.0 lb

## 2017-12-29 DIAGNOSIS — E1159 Type 2 diabetes mellitus with other circulatory complications: Secondary | ICD-10-CM

## 2017-12-29 DIAGNOSIS — F418 Other specified anxiety disorders: Secondary | ICD-10-CM

## 2017-12-29 DIAGNOSIS — Z8673 Personal history of transient ischemic attack (TIA), and cerebral infarction without residual deficits: Secondary | ICD-10-CM

## 2017-12-29 DIAGNOSIS — R7989 Other specified abnormal findings of blood chemistry: Secondary | ICD-10-CM | POA: Diagnosis not present

## 2017-12-29 DIAGNOSIS — N3281 Overactive bladder: Secondary | ICD-10-CM

## 2017-12-29 DIAGNOSIS — F321 Major depressive disorder, single episode, moderate: Secondary | ICD-10-CM | POA: Diagnosis not present

## 2017-12-29 DIAGNOSIS — R1013 Epigastric pain: Secondary | ICD-10-CM | POA: Diagnosis not present

## 2017-12-29 DIAGNOSIS — Z8546 Personal history of malignant neoplasm of prostate: Secondary | ICD-10-CM

## 2017-12-29 DIAGNOSIS — Z7689 Persons encountering health services in other specified circumstances: Secondary | ICD-10-CM

## 2017-12-29 DIAGNOSIS — I10 Essential (primary) hypertension: Secondary | ICD-10-CM

## 2017-12-29 DIAGNOSIS — E1169 Type 2 diabetes mellitus with other specified complication: Secondary | ICD-10-CM | POA: Diagnosis not present

## 2017-12-29 DIAGNOSIS — Z8739 Personal history of other diseases of the musculoskeletal system and connective tissue: Secondary | ICD-10-CM | POA: Diagnosis not present

## 2017-12-29 DIAGNOSIS — E1122 Type 2 diabetes mellitus with diabetic chronic kidney disease: Secondary | ICD-10-CM

## 2017-12-29 DIAGNOSIS — Z933 Colostomy status: Secondary | ICD-10-CM | POA: Diagnosis not present

## 2017-12-29 DIAGNOSIS — I152 Hypertension secondary to endocrine disorders: Secondary | ICD-10-CM

## 2017-12-29 DIAGNOSIS — E785 Hyperlipidemia, unspecified: Secondary | ICD-10-CM

## 2017-12-29 DIAGNOSIS — M48062 Spinal stenosis, lumbar region with neurogenic claudication: Secondary | ICD-10-CM

## 2017-12-29 DIAGNOSIS — E875 Hyperkalemia: Secondary | ICD-10-CM | POA: Diagnosis not present

## 2017-12-29 MED ORDER — ALLOPURINOL 300 MG PO TABS: 300.0000 mg | ORAL_TABLET | Freq: Every day | ORAL | 1 refills | Status: DC

## 2017-12-29 MED ORDER — AMLODIPINE BESYLATE 5 MG PO TABS: 5.0000 mg | ORAL_TABLET | Freq: Every day | ORAL | 1 refills | Status: DC

## 2017-12-29 MED ORDER — LINAGLIPTIN 5 MG PO TABS: 5.0000 mg | ORAL_TABLET | Freq: Every day | ORAL | 1 refills | Status: DC

## 2017-12-29 MED ORDER — OMEPRAZOLE 20 MG PO CPDR: 20.0000 mg | DELAYED_RELEASE_CAPSULE | Freq: Every day | ORAL | 1 refills | Status: DC

## 2017-12-29 MED ORDER — ATORVASTATIN CALCIUM 20 MG PO TABS: 20.0000 mg | ORAL_TABLET | Freq: Every day | ORAL | 1 refills | Status: DC

## 2017-12-29 MED ORDER — GLIMEPIRIDE 2 MG PO TABS: 2.0000 mg | ORAL_TABLET | Freq: Every day | ORAL | 1 refills | Status: DC

## 2017-12-29 MED ORDER — METOPROLOL TARTRATE 25 MG PO TABS: 25.0000 mg | ORAL_TABLET | Freq: Two times a day (BID) | ORAL | 1 refills | Status: DC

## 2017-12-29 MED ORDER — SERTRALINE HCL 50 MG PO TABS: ORAL_TABLET | ORAL | 3 refills | Status: DC

## 2017-12-29 MED ORDER — OXYBUTYNIN CHLORIDE 5 MG PO TABS: 5.0000 mg | ORAL_TABLET | Freq: Two times a day (BID) | ORAL | 1 refills | Status: DC

## 2017-12-29 MED ORDER — METHOCARBAMOL 500 MG PO TABS: 500.0000 mg | ORAL_TABLET | Freq: Two times a day (BID) | ORAL | 3 refills | Status: DC | PRN

## 2017-12-29 MED ORDER — VALSARTAN 160 MG PO TABS: 160.0000 mg | ORAL_TABLET | Freq: Every day | ORAL | 1 refills | Status: DC

## 2017-12-29 NOTE — Progress Notes (Signed)
HPI:                                                                Damon Acosta is a 77 y.o. male who presents to [facility]: Primary Care [facility] Medicine today to establish care   Current concerns: requesting medication refills  Recently relocated from [place] with his wife.  Reports 4-5 years ago he had a colostomy with surgeon Dr. Gwyndolyn Kaufman in [place], [place].  He really is not sure of the reason for his colostomy.  He thinks it may have been due to chronic constipation.  He denies any history of inflammatory bowel disease but really is not certain.  He reports today that he is really distressed by his colostomy.  He feels that this interferes with his quality of life.  He feels that if he had this reversed his mood would improve.  Requesting a referral to discuss reversal.   In speaking with him further, he confides that he has in fact felt depressed for some time now.  He really cites his multiple chronic health conditions as the reason for his depressed mood. He isn't able to do the things he used to enjoy. His wife also has some medical problems and he states he does not want to bother her by talking about his own problems.  Reports he often feels like "just giving up."  He admits to recurring thoughts of being better off dead.  However he states he is a man of faith and that he would never self-harm or attempt suicide.  Requesting for medication refills.  He also would like his losartan switch to an alternative medication due to the recall.  Fall Risk  12/29/2017  Falls in the past year? 1  Number falls in past yr: 1  Injury with Fall? 0  Risk for fall due to : Impaired balance/gait;History of fall(s);Impaired mobility     Depression screen PHQ 2/9 12/29/2017  Decreased Interest 3  Down, Depressed, Hopeless 3  PHQ - 2 Score 6  Altered sleeping 3  Tired, decreased energy 1  Change in appetite 0  Feeling bad or failure about yourself  3  Trouble  concentrating 0  Moving slowly or fidgety/restless 1  Suicidal thoughts 2  PHQ-9 Score 16  Difficult doing work/chores Somewhat difficult    GAD 7 : Generalized Anxiety Score 01/06/2018  Nervous, Anxious, on Edge 2  Control/stop worrying 3  Worry too much - different things 3  Trouble relaxing 3  Restless 2  Easily annoyed or irritable 3  Afraid - awful might happen 3  Total GAD 7 Score 19  Anxiety Difficulty Very difficult      Past Medical History:  Diagnosis Date  . Anxiety   . Arthritis   . Cancer ([place])   . Diabetes mellitus without complication ([place])   . Gout   . Hyperlipidemia   . Hypertension   . Overactive bladder   . Sleep apnea   . Stroke [facility])    Past Surgical History:  Procedure Laterality Date  . CHOLECYSTECTOMY    . COLOSTOMY    . PROSTATE SURGERY     Social History   Tobacco Use  . Smoking status: Former Research scientist (life sciences)  . Smokeless tobacco: Never Used  Substance Use Topics  . Alcohol use: Not Currently   Family history is unknown by patient.    ROS: Review of Systems  Respiratory: Positive for cough.   Musculoskeletal: Positive for back pain, falls, joint pain and myalgias.  Psychiatric/Behavioral: Positive for depression. The patient is nervous/anxious.      Medications: Current Outpatient Medications  Medication Sig Dispense Refill  . allopurinol (ZYLOPRIM) 300 MG tablet Take 1 tablet (300 mg total) by mouth daily. 90 tablet 1  . amLODipine (NORVASC) 5 MG tablet Take 1 tablet (5 mg total) by mouth daily. 90 tablet 1  . atorvastatin (LIPITOR) 20 MG tablet Take 1 tablet (20 mg total) by mouth at bedtime. 90 tablet 1  . clopidogrel (PLAVIX) 75 MG tablet Take 75 mg by mouth daily.    Marland Kitchen glimepiride (AMARYL) 2 MG tablet Take 1 tablet (2 mg total) by mouth daily with breakfast. 90 tablet 1  . linagliptin (TRADJENTA) 5 MG TABS tablet Take 1 tablet (5 mg total) by mouth daily. 90 tablet 1  . metoprolol tartrate (LOPRESSOR) 25 MG tablet Take 1 tablet  (25 mg total) by mouth 2 (two) times daily. 180 tablet 1  . nitroGLYCERIN (NITROSTAT) 0.6 MG SL tablet Place 0.6 mg under the tongue every 5 (five) minutes as needed for chest pain.    Marland Kitchen omeprazole (PRILOSEC) 20 MG capsule Take 1 capsule (20 mg total) by mouth daily. 90 capsule 1  . oxybutynin (DITROPAN) 5 MG tablet Take 1 tablet (5 mg total) by mouth 2 (two) times daily. 180 tablet 1  . potassium chloride (MICRO-K) 10 MEQ CR capsule Take 10 mEq by mouth daily.    . irbesartan (AVAPRO) 300 MG tablet Take 1 tablet (300 mg total) by mouth daily. 90 tablet 1  . methocarbamol (ROBAXIN) 500 MG tablet Take 1 tablet (500 mg total) by mouth 2 (two) times daily as needed for muscle spasms. 60 tablet 3  . sertraline (ZOLOFT) 50 MG tablet Take 0.5 tablets (25 mg total) by mouth at bedtime for 3 days, THEN 1 tablet (50 mg total) at bedtime for 27 days. 30 tablet 3   No current facility-administered medications for this visit.    Allergies  Allergen Reactions  . Aspirin Swelling  . Shellfish Allergy        Objective:  BP 120/62   Pulse 76   Wt 204 lb (92.5 kg)  Gen:  alert, not ill-appearing, no distress, appropriate for age 54: head normocephalic without obvious abnormality, conjunctiva and cornea clear, trachea midline Pulm: Normal work of breathing, normal phonation, clear to auscultation bilaterally, no wheezes, rales or rhonchi CV: Normal rate, regular rhythm, s1 and s2 distinct, no murmurs, clicks or rubs  Neuro: alert and oriented x 3, no tremor MSK: extremities atraumatic, slow gait, ambulating with standing walker Skin: intact, no rashes on exposed skin, no jaundice, no cyanosis Psych: well-groomed, cooperative, good eye contact, depressed mood, affect mood-congruent, speech is articulate, and thought processes clear and goal-directed  Lab Results  Component Value Date   CREATININE 1.24 (H) 12/29/2017   BUN 18 12/29/2017   NA 140 12/29/2017   K 5.0 12/29/2017   CL 108 12/29/2017    CO2 28 12/29/2017    Lab Results  Component Value Date   HGBA1C 6.3 (H) 12/29/2017   Lab Results  Component Value Date   CHOL 117 12/15/2017   HDL 43 12/15/2017   LDLCALC 61 12/15/2017   TRIG 58 12/15/2017   CHOLHDL 2.7 12/15/2017  Lab Results  Component Value Date   WBC 8.6 12/15/2017   HGB 11.4 (L) 12/15/2017   HCT 36.8 (L) 12/15/2017   MCV 77.3 (L) 12/15/2017   PLT 185 12/15/2017    No results found for this or any previous visit (from the past 72 hour(s)). No results found.    Assessment and Plan: 77 y.o. male with   .Damon Acosta was seen today for establish care.  Diagnoses and all orders for this visit:  Encounter to establish care  Elevated serum creatinine -     Renal Profile with Estimated GFR  Hyperkalemia -     Renal Profile with Estimated GFR  Type 2 diabetes mellitus with chronic kidney disease, without long-term current use of insulin, unspecified CKD stage ([name]) -     glimepiride (AMARYL) 2 MG tablet; Take 1 tablet (2 mg total) by mouth daily with breakfast. -     linagliptin (TRADJENTA) 5 MG TABS tablet; Take 1 tablet (5 mg total) by mouth daily. -     Hemoglobin A1c  Colostomy status ([place]) -     Ambulatory referral to General Surgery  Current moderate episode of major depressive disorder without prior episode ([name]) -     sertraline (ZOLOFT) 50 MG tablet; Take 0.5 tablets (25 mg total) by mouth at bedtime for 3 days, THEN 1 tablet (50 mg total) at bedtime for 27 days.  Spinal stenosis of lumbar region with neurogenic claudication -     methocarbamol (ROBAXIN) 500 MG tablet; Take 1 tablet (500 mg total) by mouth 2 (two) times daily as needed for muscle spasms.  History of gout -     allopurinol (ZYLOPRIM) 300 MG tablet; Take 1 tablet (300 mg total) by mouth daily.  Hypertension associated with diabetes ([place]) -     amLODipine (NORVASC) 5 MG tablet; Take 1 tablet (5 mg total) by mouth daily. -     metoprolol tartrate (LOPRESSOR) 25 MG  tablet; Take 1 tablet (25 mg total) by mouth 2 (two) times daily.  History of CVA (cerebrovascular accident) -     atorvastatin (LIPITOR) 20 MG tablet; Take 1 tablet (20 mg total) by mouth at bedtime.  Dyslipidemia associated with type 2 diabetes mellitus ([name]) -     atorvastatin (LIPITOR) 20 MG tablet; Take 1 tablet (20 mg total) by mouth at bedtime.  Dyspepsia -     omeprazole (PRILOSEC) 20 MG capsule; Take 1 capsule (20 mg total) by mouth daily.  OAB (overactive bladder) -     oxybutynin (DITROPAN) 5 MG tablet; Take 1 tablet (5 mg total) by mouth 2 (two) times daily.  History of prostate cancer  Anxiety about health  Other orders -     Discontinue: valsartan (DIOVAN) 160 MG tablet; Take 1 tablet (160 mg total) by mouth daily.   - Personally reviewed PMH, PSH, PFH, medications, allergies, HM - Personally reviewed labs ordered prior to this visit. Noted mild hyperkalemia. Recheck renal function today - Age-appropriate cancer screening: will not prostate cancer survivorship/surveillance - Immunizations unknown, awaiting records - Influenza UTD per patient - PHQ2 positive  Depression:  new diagnosis, PHQ9=16, no acute safety issues, patient was verbally contracted for safety Starting Sertraline Close follow-up in 1 month  Diabetes Well controlled LDL goal <70, cont Atorvastatin BP goal <140/90, cont Amlodipine, switching to valsartan at patient's request  Foot exam deferred today due to patient's other health concerns Counseled on routine preventive care including immunizations and annual eye exam  Patient education and anticipatory  guidance given Patient agrees with treatment plan Follow-up in 1 month or sooner as needed if symptoms worsen or fail to improve  Darlyne Russian PA-C

## 2017-12-29 NOTE — Patient Instructions (Signed)
Living With Depression Everyone experiences occasional disappointment, sadness, and loss in their lives. When you are feeling down, blue, or sad for at least 2 weeks in a row, it may mean that you have depression. Depression can affect your thoughts and feelings, relationships, daily activities, and physical health. It is caused by changes in the way your brain functions. If you receive a diagnosis of depression, your health care provider will tell you which type of depression you have and what treatment options are available to you. If you are living with depression, there are ways to help you recover from it and also ways to prevent it from coming back. How to cope with lifestyle changes Coping with stress Stress is your body's reaction to life changes and events, both good and bad. Stressful situations may include:  Getting married.  The death of a spouse.  Losing a job.  Retiring.  Having a baby.  Stress can last just a few hours or it can be ongoing. Stress can play a major role in depression, so it is important to learn both how to cope with stress and how to think about it differently. Talk with your health care provider or a counselor if you would like to learn more about stress reduction. He or she may suggest some stress reduction techniques, such as:  Music therapy. This can include creating music or listening to music. Choose music that you enjoy and that inspires you.  Mindfulness-based meditation. This kind of meditation can be done while sitting or walking. It involves being aware of your normal breaths, rather than trying to control your breathing.  Centering prayer. This is a kind of meditation that involves focusing on a spiritual word or phrase. Choose a word, phrase, or sacred image that is meaningful to you and that brings you peace.  Deep breathing. To do this, expand your stomach and inhale slowly through your nose. Hold your breath for 3-5 seconds, then exhale  slowly, allowing your stomach muscles to relax.  Muscle relaxation. This involves intentionally tensing muscles then relaxing them.  Choose a stress reduction technique that fits your lifestyle and personality. Stress reduction techniques take time and practice to develop. Set aside 5-15 minutes a day to do them. Therapists can offer training in these techniques. The training may be covered by some insurance plans. Other things you can do to manage stress include:  Keeping a stress diary. This can help you learn what triggers your stress and ways to control your response.  Understanding what your limits are and saying no to requests or events that lead to a schedule that is too full.  Thinking about how you respond to certain situations. You may not be able to control everything, but you can control how you react.  Adding humor to your life by watching funny films or TV shows.  Making time for activities that help you relax and not feeling guilty about spending your time this way.  Medicines Your health care provider may suggest certain medicines if he or she feels that they will help improve your condition. Avoid using alcohol and other substances that may prevent your medicines from working properly (may interact). It is also important to:  Talk with your pharmacist or health care provider about all the medicines that you take, their possible side effects, and what medicines are safe to take together.  Make it your goal to take part in all treatment decisions (shared decision-making). This includes giving input on the side   effects of medicines. It is best if shared decision-making with your health care provider is part of your total treatment plan.  If your health care provider prescribes a medicine, you may not notice the full benefits of it for 4-8 weeks. Most people who are treated for depression need to be on medicine for at least 6-12 months after they feel better. If you are taking  medicines as part of your treatment, do not stop taking medicines without first talking to your health care provider. You may need to have the medicine slowly decreased (tapered) over time to decrease the risk of harmful side effects. Relationships Your health care provider may suggest family therapy along with individual therapy and drug therapy. While there may not be family problems that are causing you to feel depressed, it is still important to make sure your family learns as much as they can about your mental health. Having your family's support can help make your treatment successful. How to recognize changes in your condition Everyone has a different response to treatment for depression. Recovery from major depression happens when you have not had signs of major depression for two months. This may mean that you will start to:  Have more interest in doing activities.  Feel less hopeless than you did 2 months ago.  Have more energy.  Overeat less often, or have better or improving appetite.  Have better concentration.  Your health care provider will work with you to decide the next steps in your recovery. It is also important to recognize when your condition is getting worse. Watch for these signs:  Having fatigue or low energy.  Eating too much or too little.  Sleeping too much or too little.  Feeling restless, agitated, or hopeless.  Having trouble concentrating or making decisions.  Having unexplained physical complaints.  Feeling irritable, angry, or aggressive.  Get help as soon as you or your family members notice these symptoms coming back. How to get support and help from others How to talk with friends and family members about your condition Talking to friends and family members about your condition can provide you with one way to get support and guidance. Reach out to trusted friends or family members, explain your symptoms to them, and let them know that you are  working with a health care provider to treat your depression. Financial resources Not all insurance plans cover mental health care, so it is important to check with your insurance carrier. If paying for co-pays or counseling services is a problem, search for a local or county mental health care center. They may be able to offer public mental health care services at low or no cost when you are not able to see a private health care provider. If you are taking medicine for depression, you may be able to get the generic form, which may be less expensive. Some makers of prescription medicines also offer help to patients who cannot afford the medicines they need. Follow these instructions at home:  Get the right amount and quality of sleep.  Cut down on using caffeine, tobacco, alcohol, and other potentially harmful substances.  Try to exercise, such as walking or lifting small weights.  Take over-the-counter and prescription medicines only as told by your health care provider.  Eat a healthy diet that includes plenty of vegetables, fruits, whole grains, low-fat dairy products, and lean protein. Do not eat a lot of foods that are high in solid fats, added sugars, or salt.    Keep all follow-up visits as told by your health care provider. This is important. Contact a health care provider if:  You stop taking your antidepressant medicines, and you have any of these symptoms: ? Nausea. ? Headache. ? Feeling lightheaded. ? Chills and body aches. ? Not being able to sleep (insomnia).  You or your friends and family think your depression is getting worse. Get help right away if:  You have thoughts of hurting yourself or others. If you ever feel like you may hurt yourself or others, or have thoughts about taking your own life, get help right away. You can go to your nearest emergency department or call:  Your local emergency services (911 in the [place]  A suicide crisis helpline, such as the  National Suicide Prevention Lifeline at 1-800-273-8255. This is open 24-hours a day.  Summary  If you are living with depression, there are ways to help you recover from it and also ways to prevent it from coming back.  Work with your health care team to create a management plan that includes counseling, stress management techniques, and healthy lifestyle habits. This information is not intended to replace advice given to you by your health care provider. Make sure you discuss any questions you have with your health care provider. Document Released: 12/31/2015 Document Revised: 12/31/2015 Document Reviewed: 12/31/2015 Elsevier Interactive Patient Education  2018 Elsevier Inc.  

## 2017-12-30 LAB — RENAL PROFILE WITH ESTIMATED GFR
ALBUMIN MSPROF: 3.8 g/dL (ref 3.6–5.1)
BUN / CREAT RATIO: 15 (calc) (ref 6–22)
BUN: 18 mg/dL (ref 7–25)
CALCIUM: 9.3 mg/dL (ref 8.6–10.3)
CO2: 28 mmol/L (ref 20–32)
CREATININE: 1.24 mg/dL — AB (ref 0.70–1.18)
Chloride: 108 mmol/L (ref 98–110)
GFR, Est African American: 64 mL/min/{1.73_m2} (ref >=60)
GFR, Est Non African American: 55 mL/min/{1.73_m2} — ABNORMAL LOW (ref >=60)
Glucose, Bld: 85 mg/dL (ref 65–99)
Phosphorus: 3.6 mg/dL (ref 2.1–4.3)
Potassium: 5 mmol/L (ref 3.5–5.3)
Sodium: 140 mmol/L (ref 135–146)

## 2017-12-30 LAB — HEMOGLOBIN A1C
Hgb A1c MFr Bld: 6.3 % of total Hgb — ABNORMAL HIGH (ref <5.7)
MEAN PLASMA GLUCOSE: 134 (calc)
eAG (mmol/L): 7.4 (calc)

## 2018-01-01 DIAGNOSIS — M4807 Spinal stenosis, lumbosacral region: Secondary | ICD-10-CM | POA: Diagnosis not present

## 2018-01-05 ENCOUNTER — Other Ambulatory Visit: Payer: Self-pay | Admitting: Physician Assistant

## 2018-01-05 DIAGNOSIS — E1159 Type 2 diabetes mellitus with other circulatory complications: Secondary | ICD-10-CM

## 2018-01-05 DIAGNOSIS — I152 Hypertension secondary to endocrine disorders: Secondary | ICD-10-CM

## 2018-01-05 DIAGNOSIS — I10 Essential (primary) hypertension: Principal | ICD-10-CM

## 2018-01-05 MED ORDER — IRBESARTAN 300 MG PO TABS: 300.0000 mg | ORAL_TABLET | Freq: Every day | ORAL | 1 refills | Status: DC

## 2018-01-06 ENCOUNTER — Encounter: Payer: Self-pay | Admitting: Physician Assistant

## 2018-01-06 DIAGNOSIS — E875 Hyperkalemia: Secondary | ICD-10-CM | POA: Insufficient documentation

## 2018-01-06 DIAGNOSIS — N3281 Overactive bladder: Secondary | ICD-10-CM | POA: Insufficient documentation

## 2018-01-06 DIAGNOSIS — F321 Major depressive disorder, single episode, moderate: Secondary | ICD-10-CM | POA: Insufficient documentation

## 2018-01-06 DIAGNOSIS — R1013 Epigastric pain: Secondary | ICD-10-CM | POA: Insufficient documentation

## 2018-01-06 DIAGNOSIS — E1169 Type 2 diabetes mellitus with other specified complication: Secondary | ICD-10-CM | POA: Insufficient documentation

## 2018-01-06 DIAGNOSIS — F418 Other specified anxiety disorders: Secondary | ICD-10-CM | POA: Insufficient documentation

## 2018-01-06 DIAGNOSIS — E785 Hyperlipidemia, unspecified: Secondary | ICD-10-CM

## 2018-01-06 DIAGNOSIS — Z8739 Personal history of other diseases of the musculoskeletal system and connective tissue: Secondary | ICD-10-CM | POA: Insufficient documentation

## 2018-01-06 DIAGNOSIS — R7989 Other specified abnormal findings of blood chemistry: Secondary | ICD-10-CM | POA: Insufficient documentation

## 2018-01-06 DIAGNOSIS — E1122 Type 2 diabetes mellitus with diabetic chronic kidney disease: Secondary | ICD-10-CM | POA: Insufficient documentation

## 2018-01-06 DIAGNOSIS — Z8546 Personal history of malignant neoplasm of prostate: Secondary | ICD-10-CM | POA: Insufficient documentation

## 2018-01-12 ENCOUNTER — Encounter: Payer: Self-pay | Admitting: Physician Assistant

## 2018-01-12 DIAGNOSIS — K598 Other specified functional intestinal disorders: Secondary | ICD-10-CM

## 2018-01-12 DIAGNOSIS — K5981 Ogilvie syndrome: Secondary | ICD-10-CM

## 2018-01-12 DIAGNOSIS — K5989 Other specified functional intestinal disorders: Secondary | ICD-10-CM | POA: Insufficient documentation

## 2018-01-12 HISTORY — DX: Ogilvie syndrome: K59.81

## 2018-01-12 HISTORY — DX: Other specified functional intestinal disorders: K59.89

## 2018-01-20 ENCOUNTER — Other Ambulatory Visit: Payer: Self-pay | Admitting: Physician Assistant

## 2018-01-20 DIAGNOSIS — F321 Major depressive disorder, single episode, moderate: Secondary | ICD-10-CM

## 2018-01-26 ENCOUNTER — Ambulatory Visit (INDEPENDENT_AMBULATORY_CARE_PROVIDER_SITE_OTHER): Payer: Medicare Other | Admitting: Physician Assistant

## 2018-01-26 ENCOUNTER — Encounter: Payer: Self-pay | Admitting: Physician Assistant

## 2018-01-26 VITALS — BP 109/61 | HR 72 | Wt 205.0 lb

## 2018-01-26 DIAGNOSIS — Z23 Encounter for immunization: Secondary | ICD-10-CM

## 2018-01-26 DIAGNOSIS — F5105 Insomnia due to other mental disorder: Secondary | ICD-10-CM

## 2018-01-26 DIAGNOSIS — L84 Corns and callosities: Secondary | ICD-10-CM | POA: Insufficient documentation

## 2018-01-26 DIAGNOSIS — F321 Major depressive disorder, single episode, moderate: Secondary | ICD-10-CM | POA: Diagnosis not present

## 2018-01-26 DIAGNOSIS — E119 Type 2 diabetes mellitus without complications: Secondary | ICD-10-CM | POA: Diagnosis not present

## 2018-01-26 DIAGNOSIS — F409 Phobic anxiety disorder, unspecified: Secondary | ICD-10-CM

## 2018-01-26 DIAGNOSIS — Z0100 Encounter for examination of eyes and vision without abnormal findings: Secondary | ICD-10-CM | POA: Diagnosis not present

## 2018-01-26 MED ORDER — DOXEPIN HCL 25 MG PO CAPS: 25.0000 mg | ORAL_CAPSULE | Freq: Every day | ORAL | 0 refills | Status: DC

## 2018-01-26 NOTE — Progress Notes (Signed)
HPI:                                                                Damon Acosta is a 77 y.o. male who presents to [facility]: [facility] today for depression/anxiety follow-up  Depression/Anxiety: he has been taking Sertraline 50 mg for about 1 month.Has noted improvement in his mood. Still not sleeping well through the night. Endorses multiple nighttime awakening. Unfortunately he has noted increase in watery stool and having to change colostomy bag every 2-3 hours. Denies symptoms of mania/hypomania. Denies suicidal thinking. Denies auditory/visual hallucinations.    Depression screen [facility] 2/9 01/26/2018 12/29/2017  Decreased Interest 1 3  Down, Depressed, Hopeless 1 3  PHQ - 2 Score 2 6  Altered sleeping 3 3  Tired, decreased energy 2 1  Change in appetite 2 0  Feeling bad or failure about yourself  1 3  Trouble concentrating 0 0  Moving slowly or fidgety/restless 0 1  Suicidal thoughts 0 2  PHQ-9 Score 10 16  Difficult doing work/chores Not difficult at all Somewhat difficult    GAD 7 : Generalized Anxiety Score 01/26/2018 01/06/2018  Nervous, Anxious, on Edge 3 2  Control/stop worrying 2 3  Worry too much - different things 2 3  Trouble relaxing 1 3  Restless 2 2  Easily annoyed or irritable 2 3  Afraid - awful might happen 2 3  Total GAD 7 Score 14 19  Anxiety Difficulty Somewhat difficult Very difficult      Past Medical History:  Diagnosis Date  . Anxiety   . Arthritis   . Cancer ([place])   . 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 Diabetes mellitus without complication ([place])   . Gout   . Hyperlipidemia   . Hypertension   . 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 Overactive bladder   . Sleep apnea   . Stroke [facility])    Past Surgical History:  Procedure Laterality Date  . CHOLECYSTECTOMY    . COLOSTOMY    . PROSTATE SURGERY     Social History   Tobacco Use  . Smoking status: Former Research scientist (life sciences)  . Smokeless  tobacco: Never Used  Substance Use Topics  . Alcohol use: Not Currently   Family history is unknown by patient.    ROS: negative except as noted in the HPI  Medications: Current Outpatient Medications  Medication Sig Dispense Refill  . allopurinol (ZYLOPRIM) 300 MG tablet Take 1 tablet (300 mg total) by mouth daily. 90 tablet 1  . amLODipine (NORVASC) 5 MG tablet Take 1 tablet (5 mg total) by mouth daily. 90 tablet 1  . atorvastatin (LIPITOR) 20 MG tablet Take 1 tablet (20 mg total) by mouth at bedtime. 90 tablet 1  . clopidogrel (PLAVIX) 75 MG tablet Take 75 mg by mouth daily.    Marland Kitchen glimepiride (AMARYL) 2 MG tablet Take 1 tablet (2 mg total) by mouth daily with breakfast. 90 tablet 1  . irbesartan (AVAPRO) 300 MG tablet Take 1 tablet (300 mg total) by mouth daily. 90 tablet 1  . linagliptin (TRADJENTA) 5 MG TABS tablet Take 1 tablet (5 mg total) by mouth daily. 90 tablet 1  . methocarbamol (ROBAXIN) 500 MG tablet Take 1 tablet (500 mg total)  by mouth 2 (two) times daily as needed for muscle spasms. 60 tablet 3  . metoprolol tartrate (LOPRESSOR) 25 MG tablet Take 1 tablet (25 mg total) by mouth 2 (two) times daily. 180 tablet 1  . nitroGLYCERIN (NITROSTAT) 0.6 MG SL tablet Place 0.6 mg under the tongue every 5 (five) minutes as needed for chest pain.    Marland Kitchen omeprazole (PRILOSEC) 20 MG capsule Take 1 capsule (20 mg total) by mouth daily. 90 capsule 1  . oxybutynin (DITROPAN) 5 MG tablet Take 1 tablet (5 mg total) by mouth 2 (two) times daily. 180 tablet 1  . potassium chloride (MICRO-K) 10 MEQ CR capsule Take 10 mEq by mouth daily.    Marland Kitchen doxepin (SINEQUAN) 25 MG capsule Take 1 capsule (25 mg total) by mouth at bedtime. 90 capsule 0   No current facility-administered medications for this visit.    Allergies  Allergen Reactions  . Aspirin Swelling  . Shellfish Allergy   . Sertraline Diarrhea       Objective:  BP 109/61   Pulse 72   Wt 205 lb (93 kg)   BMI 30.27 kg/m  Gen:   alert, not ill-appearing, no distress, appropriate for age 42: head normocephalic without obvious abnormality, conjunctiva and cornea clear, trachea midline Pulm: Normal work of breathing, normal phonation, clear to auscultation bilaterally, no wheezes, rales or rhonchi CV: Normal rate, regular rhythm, s1 and s2 distinct, no murmurs, clicks or rubs  Neuro: alert and oriented x 3, no tremor Psych: well-groomed, cooperative, good eye contact, depressed mood, affect mood-congruent, speech is articulate, insight fair  Diabetic Foot Exam - Simple   Simple Foot Form Diabetic Foot exam was performed with the following findings:  Yes 01/26/2018 11:40 AM  Visual Inspection See comments:  Yes Sensation Testing See comments:  Yes Pulse Check Posterior Tibialis and Dorsalis pulse intact bilaterally:  Yes Comments Bilateral onychomycosis Corn of left 5th MTP, plantar aspect Sensation absent at 1,2,3,4,5,6 bilaterally      No results found for this or any previous visit (from the past 72 hour(s)). No results found.    Assessment and Plan: 77 y.o. male with   .Damon Acosta was seen today for anxiety.  Diagnoses and all orders for this visit:  Current moderate episode of major depressive disorder without prior episode ([name]) -     doxepin (SINEQUAN) 25 MG capsule; Take 1 capsule (25 mg total) by mouth at bedtime.  Insomnia due to anxiety and fear -     doxepin (SINEQUAN) 25 MG capsule; Take 1 capsule (25 mg total) by mouth at bedtime.  Diabetic eye exam ([place]) -     Ambulatory referral to Ophthalmology  Encounter for diabetic foot exam ([place])  Corn of left foot, plantar fifth MTP  Other orders -     Pneumococcal polysaccharide vaccine 23-valent greater than or equal to 2yo subcutaneous/IM   PHQ9=10, no acute safety issues GAD7=partial response to Sertraline Unfortunately intolerance to Sertraline (loose frequent stooling) Switching to Doxepin for depression/anxiety/insomnia and  possible IBS Counseled on sleep hygiene  Reviewed health maintenance Diabetic foot exam performed today Referral placed to Ophth. Pneumovax given today  Corn of left foot Sports Medicine consulted for removal (see note)  Patient education and anticipatory guidance given Patient agrees with treatment plan Follow-up in 1 month or sooner as needed if symptoms worsen or fail to improve  Darlyne Russian PA-C

## 2018-01-26 NOTE — Progress Notes (Signed)
Subjective:    CC: Left foot pain  HPI: For months this pleasant 77 year old male with diabetes has had pain that he localizes on the plantar/lateral aspect of his left fifth MTP, localized without radiation.  Worse with weightbearing.  Severe.  I reviewed the past medical history, family history, social history, surgical history, and allergies today and no changes were needed.  Please see the problem list section below in epic for further details.  Past Medical History: Past Medical History:  Diagnosis Date  . Anxiety   . Arthritis   . Cancer ([place])   . 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 Diabetes mellitus without complication ([place])   . Gout   . Hyperlipidemia   . Hypertension   . 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 Overactive bladder   . Sleep apnea   . Stroke [facility])    Past Surgical History: Past Surgical History:  Procedure Laterality Date  . CHOLECYSTECTOMY    . COLOSTOMY    . PROSTATE SURGERY     Social History: Social History   Socioeconomic History  . Marital status: Married    Spouse name: Not on file  . Number of children: Not on file  . Years of education: Not on file  . Highest education level: Not on file  Occupational History  . Not on file  Social Needs  . Financial resource strain: Not on file  . Food insecurity:    Worry: Not on file    Inability: Not on file  . Transportation needs:    Medical: Not on file    Non-medical: Not on file  Tobacco Use  . Smoking status: Former Research scientist (life sciences)  . Smokeless tobacco: Never Used  Substance and Sexual Activity  . Alcohol use: Not Currently  . Drug use: Never  . Sexual activity: Not Currently  Lifestyle  . Physical activity:    Days per week: Not on file    Minutes per session: Not on file  . Stress: Not on file  Relationships  . Social connections:    Talks on phone: Not on file    Gets together: Not on file    Attends religious service: Not on file    Active member of club or  organization: Not on file    Attends meetings of clubs or organizations: Not on file    Relationship status: Not on file  Other Topics Concern  . Not on file  Social History Narrative  . Not on file   Family History: Family History  Family history unknown: Yes   Allergies: Allergies  Allergen Reactions  . Aspirin Swelling  . Shellfish Allergy   . Sertraline Diarrhea   Medications: See med rec.  Review of Systems: No fevers, chills, night sweats, weight loss, chest pain, or shortness of breath.   Objective:    General: Well Developed, well nourished, and in no acute distress.  Neuro: Alert and oriented x3, extra-ocular muscles intact, sensation grossly intact.  HEENT: Normocephalic, atraumatic, pupils equal round reactive to light, neck supple, no masses, no lymphadenopathy, thyroid nonpalpable.  Skin: Warm and dry, no rashes. Cardiac: Regular rate and rhythm, no murmurs rubs or gallops, no lower extremity edema.  Respiratory: Clear to auscultation bilaterally. Not using accessory muscles, speaking in full sentences. Left foot: No visible erythema or swelling. Range of motion is full in all directions. Strength is 5/5 in all directions. No hallux valgus. No pes cavus or pes planus. There is a corn and abnormal callus on the plantar  fifth MTP, exquisitely tender to palpation. No pain over the navicular prominence, or base of fifth metatarsal. No tenderness to palpation of the calcaneal insertion of plantar fascia. No pain at the Achilles insertion. No pain over the calcaneal bursa. No pain of the retrocalcaneal bursa. No tenderness to palpation over the tarsals, metatarsals, or phalanges. No hallux rigidus or limitus. No tenderness palpation over interphalangeal joints. No pain with compression of the metatarsal heads. Neurovascularly intact distally.  Procedure:  Excision of left fifth plantar MTP corn Risks, benefits, and alternatives explained and consent  obtained. Time out conducted. Surface prepped with alcohol. 3cc lidocaine with epinephine infiltrated in a field block. Adequate anesthesia ensured. Area prepped and draped in a sterile fashion. Excision performed with: Using a #11 blade I made a core excision, I then used hyfrecation for hemostasis. Hemostasis achieved. Pt stable.  Impression and Recommendations:    Corn of left foot, plantar fifth MTP Surgical core excision as above, hyfrecation to achieve hemostasis, return to see me in 3 to 4 weeks. ___________________________________________ Gwen Her. Dianah Field, M.D., ABFM., CAQSM. Primary Care and Sports Medicine [facility]  Adjunct Professor of [employer]

## 2018-01-26 NOTE — Patient Instructions (Addendum)
Stop Sertraline Start Doxepin 1 capsule at bedtime Practice sleep hygiene Follow-up in 1 month  Sleep Hygiene . Limiting daytime naps to 30 minutes . Napping does not make up for inadequate nighttime sleep. However, a short nap of 20-30 minutes can help to improve mood, alertness and performance.  . Avoiding stimulants such as  caffeine and nicotine close to bedtime.  And when it comes to alcohol, moderation is key 4. While alcohol is well-known to help you fall asleep faster, too much close to bedtime can disrupt sleep in the second half of the night as the body begins to process the alcohol.    . Exercising to promote good quality sleep.  As little as 10 minutes of aerobic exercise, such as walking or cycling, can drastically improve nighttime sleep quality.  For the best night's sleep, most people should avoid strenuous workouts close to bedtime. However, the effect of intense nighttime exercise on sleep differs from person to person, so find out what works best for you.   . Steering clear of food that can be disruptive right before sleep.   Heavy or rich foods, fatty or fried meals, spicy dishes, citrus fruits, and carbonated drinks can trigger indigestion for some people. When this occurs close to bedtime, it can lead to painful heartburn that disrupts sleep. . Ensuring adequate exposure to natural light.  This is particularly important for individuals who may not venture outside frequently. Exposure to sunlight during the day, as well as darkness at night, helps to maintain a healthy sleep-wake cycle . Marland Kitchen Establishing a regular relaxing bedtime routine.  A regular nightly routine helps the body recognize that it is bedtime. This could include taking warm shower or bath, reading a book, or light stretches. When possible, try to avoid emotionally upsetting conversations and activities before attempting to sleep. . Making sure that the sleep environment is pleasant.  Mattress and pillows should be  comfortable. The bedroom should be cool - between 60 and 67 degrees - for optimal sleep. Bright light from lamps, cell phone and TV screens can make it difficult to fall asleep4, so turn those light off or adjust them when possible. Consider using blackout curtains, eye shades, ear plugs, "white noise" machines, humidifiers, fans and other devices that can make the bedroom more relaxing. . Meditation. YouTube [address]. There are many smartphone apps as well

## 2018-01-26 NOTE — Assessment & Plan Note (Signed)
Surgical core excision as above, hyfrecation to achieve hemostasis, return to see me in 3 to 4 weeks.

## 2018-01-27 ENCOUNTER — Telehealth: Payer: Self-pay

## 2018-01-27 NOTE — Telephone Encounter (Signed)
Called pharmacy. They confirmed that Rx of doxepin was picked up on 01/26/18.  Left vm for pt advising him what was told to me by pharmacy. -EH/RMA

## 2018-01-27 NOTE — Telephone Encounter (Signed)
PT came in the office today stating that he was supposed to have medication sent to the pharmacy yesterday and it hasn't been sent in yet. He states that one of his medications (he's not sure which one) was going to be changed. Please call pt to advise.

## 2018-01-28 ENCOUNTER — Encounter: Payer: Self-pay | Admitting: Physician Assistant

## 2018-01-29 ENCOUNTER — Telehealth: Payer: Self-pay

## 2018-01-29 DIAGNOSIS — M48061 Spinal stenosis, lumbar region without neurogenic claudication: Secondary | ICD-10-CM | POA: Diagnosis not present

## 2018-01-29 NOTE — Telephone Encounter (Signed)
Branon's wife called and states patient needs something for his toe pain. She states he would like something he can apply to the area.

## 2018-02-01 NOTE — Telephone Encounter (Signed)
Patient advised.

## 2018-02-01 NOTE — Telephone Encounter (Signed)
I don't want him putting anything in or on it other than dressings and maybe neosporin.  Because the procedure is often very painful would he be ok with hydrocodone?

## 2018-02-05 MED ORDER — HYDROCODONE-ACETAMINOPHEN 5-325 MG PO TABS: 1.0000 | ORAL_TABLET | Freq: Three times a day (TID) | ORAL | 0 refills | Status: DC | PRN

## 2018-02-05 NOTE — Addendum Note (Signed)
Addended by: Silverio Decamp on: 02/05/2018 10:57 AM   Modules accepted: Orders

## 2018-02-05 NOTE — Telephone Encounter (Addendum)
I looked at his wound today during the appointment with his wife. Looks good, healing well but still very painful. Hydrocodone sent in.

## 2018-02-09 DIAGNOSIS — Z961 Presence of intraocular lens: Secondary | ICD-10-CM | POA: Diagnosis not present

## 2018-02-09 DIAGNOSIS — H527 Unspecified disorder of refraction: Secondary | ICD-10-CM | POA: Diagnosis not present

## 2018-02-09 DIAGNOSIS — H02839 Dermatochalasis of unspecified eye, unspecified eyelid: Secondary | ICD-10-CM | POA: Diagnosis not present

## 2018-02-09 DIAGNOSIS — H26493 Other secondary cataract, bilateral: Secondary | ICD-10-CM | POA: Diagnosis not present

## 2018-02-09 DIAGNOSIS — E119 Type 2 diabetes mellitus without complications: Secondary | ICD-10-CM | POA: Diagnosis not present

## 2018-02-09 LAB — HM DIABETES EYE EXAM

## 2018-02-17 ENCOUNTER — Other Ambulatory Visit: Payer: Self-pay | Admitting: Physician Assistant

## 2018-02-17 ENCOUNTER — Ambulatory Visit (INDEPENDENT_AMBULATORY_CARE_PROVIDER_SITE_OTHER): Payer: Medicare Other

## 2018-02-17 ENCOUNTER — Ambulatory Visit (INDEPENDENT_AMBULATORY_CARE_PROVIDER_SITE_OTHER): Payer: Medicare Other | Admitting: Sports Medicine

## 2018-02-17 DIAGNOSIS — M170 Bilateral primary osteoarthritis of knee: Secondary | ICD-10-CM

## 2018-02-17 DIAGNOSIS — L84 Corns and callosities: Secondary | ICD-10-CM | POA: Diagnosis not present

## 2018-02-17 DIAGNOSIS — M48062 Spinal stenosis, lumbar region with neurogenic claudication: Secondary | ICD-10-CM | POA: Diagnosis not present

## 2018-02-17 DIAGNOSIS — F321 Major depressive disorder, single episode, moderate: Secondary | ICD-10-CM

## 2018-02-17 DIAGNOSIS — M25861 Other specified joint disorders, right knee: Secondary | ICD-10-CM | POA: Diagnosis not present

## 2018-02-17 DIAGNOSIS — M25562 Pain in left knee: Secondary | ICD-10-CM | POA: Diagnosis not present

## 2018-02-17 MED ORDER — DOXYCYCLINE HYCLATE 100 MG PO TABS: 100.0000 mg | ORAL_TABLET | Freq: Two times a day (BID) | ORAL | 0 refills | Status: AC

## 2018-02-17 MED ORDER — CIPROFLOXACIN HCL 750 MG PO TABS: 750.0000 mg | ORAL_TABLET | Freq: Two times a day (BID) | ORAL | 0 refills | Status: DC

## 2018-02-17 MED ORDER — METRONIDAZOLE 500 MG PO TABS: 500.0000 mg | ORAL_TABLET | Freq: Two times a day (BID) | ORAL | 0 refills | Status: DC

## 2018-02-17 NOTE — Assessment & Plan Note (Addendum)
Surgical excision 3 weeks ago, continues to do well. Still with some pain, adding some doxycycline, Cipro, Flagyl as the standard diabetic below the diaphragm triple coverage, the surgical site is closed but does feel a bit hot and warm.

## 2018-02-17 NOTE — Assessment & Plan Note (Signed)
Injections, x-rays. Rehab exercises given. Return to see me in 1 month.

## 2018-02-17 NOTE — Progress Notes (Signed)
Subjective:    CC: Bilateral knee pain  HPI: This is a very pleasant 78 year old male, he has bilateral knee pain, present for years, localized under the kneecap and at the medial joint line with significant gelling.  Oral analgesics are not helping and he is desiring interventional treatment today.  I excised a left foot corn a few weeks ago, it still hurts but overall doing okay.  Lumbar spinal stenosis: L2 to sacral fusion planned, he is going to get an injection with Dr. Mina Marble but if in sufficient relief Dr. Lynann Bologna will proceed with multilevel fusion and decompression.  I reviewed the past medical history, family history, social history, surgical history, and allergies today and no changes were needed.  Please see the problem list section below in epic for further details.  Past Medical History: Past Medical History:  Diagnosis Date  . Anxiety   . Arthritis   . Cancer ([place])   . 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 Diabetes mellitus without complication ([place])   . Gout   . Hyperlipidemia   . Hypertension   . 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 Overactive bladder   . Sleep apnea   . Stroke [facility]    Past Surgical History: Past Surgical History:  Procedure Laterality Date  . CHOLECYSTECTOMY    . COLOSTOMY    . PROSTATE SURGERY     Social History: Social History   Socioeconomic History  . Marital status: Married    Spouse name: Not on file  . Number of children: Not on file  . Years of education: Not on file  . Highest education level: Not on file  Occupational History  . Not on file  Social Needs  . Financial resource strain: Not on file  . Food insecurity:    Worry: Not on file    Inability: Not on file  . Transportation needs:    Medical: Not on file    Non-medical: Not on file  Tobacco Use  . Smoking status: Former Research scientist (life sciences)  . Smokeless tobacco: Never Used  Substance and Sexual Activity  . Alcohol use: Not Currently  . Drug use: Never  .  Sexual activity: Not Currently  Lifestyle  . Physical activity:    Days per week: Not on file    Minutes per session: Not on file  . Stress: Not on file  Relationships  . Social connections:    Talks on phone: Not on file    Gets together: Not on file    Attends religious service: Not on file    Active member of club or organization: Not on file    Attends meetings of clubs or organizations: Not on file    Relationship status: Not on file  Other Topics Concern  . Not on file  Social History Narrative  . Not on file   Family History: Family History  Family history unknown: Yes   Allergies: Allergies  Allergen Reactions  . Aspirin Swelling  . Shellfish Allergy   . Sertraline Diarrhea   Medications: See med rec.  Review of Systems: No fevers, chills, night sweats, weight loss, chest pain, or shortness of breath.   Objective:    General: Well Developed, well nourished, and in no acute distress.  Neuro: Alert and oriented x3, extra-ocular muscles intact, sensation grossly intact.  HEENT: Normocephalic, atraumatic, pupils equal round reactive to light, neck supple, no masses, no lymphadenopathy, thyroid nonpalpable.  Skin: Warm and dry, no rashes. Cardiac: Regular rate and rhythm, no murmurs rubs  or gallops, no lower extremity edema.  Respiratory: Clear to auscultation bilaterally. Not using accessory muscles, speaking in full sentences. Bilateral knees: Normal to inspection with no erythema or effusion or obvious bony abnormalities. Tender to palpation under the patellar facets and the medial joint lines ROM normal in flexion and extension and lower leg rotation. Ligaments with solid consistent endpoints including ACL, PCL, LCL, MCL. Negative Mcmurray's and provocative meniscal tests. Non painful patellar compression. Patellar and quadriceps tendons unremarkable. Hamstring and quadriceps strength is normal.  Procedure: Real-time Ultrasound Guided Injection of left  knee Device: GE Logiq E  Verbal informed consent obtained.  Time-out conducted.  Noted no overlying erythema, induration, or other signs of local infection.  Skin prepped in a sterile fashion.  Local anesthesia: Topical Ethyl chloride.  With sterile technique and under real time ultrasound guidance: 1 cc Kenalog 40, 2 cc lidocaine, 2 cc bupivacaine injected easily Completed without difficulty  Pain immediately resolved suggesting accurate placement of the medication.  Advised to call if fevers/chills, erythema, induration, drainage, or persistent bleeding.  Images permanently stored and available for review in the ultrasound unit.  Impression: Technically successful ultrasound guided injection.  Procedure: Real-time Ultrasound Guided Injection of right knee Device: GE Logiq E  Verbal informed consent obtained.  Time-out conducted.  Noted no overlying erythema, induration, or other signs of local infection.  Skin prepped in a sterile fashion.  Local anesthesia: Topical Ethyl chloride.  With sterile technique and under real time ultrasound guidance: 1 cc Kenalog 40, 2 cc lidocaine, 2 cc bupivacaine injected easily Completed without difficulty  Pain immediately resolved suggesting accurate placement of the medication.  Advised to call if fevers/chills, erythema, induration, drainage, or persistent bleeding.  Images permanently stored and available for review in the ultrasound unit.  Impression: Technically successful ultrasound guided injection.  Impression and Recommendations:    Primary osteoarthritis of both knees Injections, x-rays. Rehab exercises given. Return to see me in 1 month.  Lumbar spinal stenosis There are spinal stenosis at multiple levels. Currently under the care of Dr. Lynann Bologna. He is going to be getting some epidurals with Dr. Mina Marble, but if insufficient relief Dr. Lynann Bologna plans an L2 to pelvic fusion.  Corn of left foot, plantar fifth MTP Surgical excision 3  weeks ago, continues to do well. Still with some pain, adding some doxycycline, Cipro, Flagyl as the standard diabetic below the diaphragm triple coverage, the surgical site is closed but does feel a bit hot and warm. ___________________________________________ Gwen Her. Dianah Field, M.D., ABFM., CAQSM. Primary Care and Sports Medicine [facility]  Adjunct Professor of [employer]

## 2018-02-17 NOTE — Assessment & Plan Note (Signed)
There are spinal stenosis at multiple levels. Currently under the care of Dr. Lynann Bologna. He is going to be getting some epidurals with Dr. Mina Marble, but if insufficient relief Dr. Lynann Bologna plans an L2 to pelvic fusion.

## 2018-02-19 ENCOUNTER — Encounter: Payer: Self-pay | Admitting: Physician Assistant

## 2018-02-23 ENCOUNTER — Encounter: Payer: Self-pay | Admitting: Physician Assistant

## 2018-02-23 ENCOUNTER — Ambulatory Visit (INDEPENDENT_AMBULATORY_CARE_PROVIDER_SITE_OTHER): Payer: Medicare Other | Admitting: Physician Assistant

## 2018-02-23 VITALS — BP 119/60 | HR 72 | Wt 210.0 lb

## 2018-02-23 DIAGNOSIS — E11628 Type 2 diabetes mellitus with other skin complications: Secondary | ICD-10-CM | POA: Diagnosis not present

## 2018-02-23 DIAGNOSIS — L089 Local infection of the skin and subcutaneous tissue, unspecified: Secondary | ICD-10-CM | POA: Diagnosis not present

## 2018-02-23 DIAGNOSIS — F321 Major depressive disorder, single episode, moderate: Secondary | ICD-10-CM

## 2018-02-23 DIAGNOSIS — F409 Phobic anxiety disorder, unspecified: Secondary | ICD-10-CM

## 2018-02-23 DIAGNOSIS — F5105 Insomnia due to other mental disorder: Secondary | ICD-10-CM | POA: Diagnosis not present

## 2018-02-23 MED ORDER — DOXEPIN HCL 25 MG PO CAPS: 50.0000 mg | ORAL_CAPSULE | Freq: Every day | ORAL | 2 refills | Status: DC

## 2018-02-23 NOTE — Patient Instructions (Signed)
Increase Doxepin to 50 mg at bedtime Cut Sertraline tablets in half for 1 week then discontinue Do not combine with any alcohol or other sedating medications (such as Hydrocodone)  Sleep Hygiene . Limiting daytime naps to 30 minutes . Napping does not make up for inadequate nighttime sleep. However, a short nap of 20-30 minutes can help to improve mood, alertness and performance.  . Avoiding stimulants such as  caffeine and nicotine close to bedtime.  And when it comes to alcohol, moderation is key 4. While alcohol is well-known to help you fall asleep faster, too much close to bedtime can disrupt sleep in the second half of the night as the body begins to process the alcohol.    . Exercising to promote good quality sleep.  As little as 10 minutes of aerobic exercise, such as walking or cycling, can drastically improve nighttime sleep quality.  For the best night's sleep, most people should avoid strenuous workouts close to bedtime. However, the effect of intense nighttime exercise on sleep differs from person to person, so find out what works best for you.   . Steering clear of food that can be disruptive right before sleep.   Heavy or rich foods, fatty or fried meals, spicy dishes, citrus fruits, and carbonated drinks can trigger indigestion for some people. When this occurs close to bedtime, it can lead to painful heartburn that disrupts sleep. . Ensuring adequate exposure to natural light.  This is particularly important for individuals who may not venture outside frequently. Exposure to sunlight during the day, as well as darkness at night, helps to maintain a healthy sleep-wake cycle . Marland Kitchen Establishing a regular relaxing bedtime routine.  A regular nightly routine helps the body recognize that it is bedtime. This could include taking warm shower or bath, reading a book, or light stretches. When possible, try to avoid emotionally upsetting conversations and activities before attempting to  sleep. . Making sure that the sleep environment is pleasant.  Mattress and pillows should be comfortable. The bedroom should be cool - between 60 and 67 degrees - for optimal sleep. Bright light from lamps, cell phone and TV screens can make it difficult to fall asleep4, so turn those light off or adjust them when possible. Consider using blackout curtains, eye shades, ear plugs, "white noise" machines, humidifiers, fans and other devices that can make the bedroom more relaxing. . Meditation. YouTube [address]. There are many smartphone apps as well

## 2018-02-23 NOTE — Progress Notes (Signed)
HPI:                                                                Damon Acosta is a 78 y.o. male who presents to Damon Acosta: Damon Acosta today for depression/anxiety follow-up  Since last OV - switched from Sertraline due to loose stools. He states that the pharmacy gave him a refill of Sertraline and he thinks maybe he is still taking this - he started Doxepin 25 mg in the evening and states he is sleeping much better - he overall feels his mood has improved compared to 2 months ago - continues to endorses feeling like he would be better off dead, but denies suicidal thoughts or self harm  On 02/17/2018 sports medicine started him on triple antibiotic therapy for possible diabetic foot infection related to corn removal. Corn was excised from left forefoot on 01/26/2018. He continues to complain of persistent left foot pain at the sight of the corn removal. It is worse with weight bearing. Pain does not wake him up at night. There has been no drainage from the wound.  Depression screen [facility] 2/9 02/23/2018 01/26/2018 12/29/2017  Decreased Interest 2 1 3   Down, Depressed, Hopeless 1 1 3   PHQ - 2 Score 3 2 6   Altered sleeping 2 3 3   Tired, decreased energy 2 2 1   Change in appetite 1 2 0  Feeling bad or failure about yourself  2 1 3   Trouble concentrating 1 0 0  Moving slowly or fidgety/restless 1 0 1  Suicidal thoughts 2 0 2  PHQ-9 Score 14 10 16   Difficult doing work/chores Not difficult at all Not difficult at all Somewhat difficult    GAD 7 : Generalized Anxiety Score 02/23/2018 01/26/2018 01/06/2018  Nervous, Anxious, on Edge 1 3 2   Control/stop worrying 1 2 3   Worry too much - different things 1 2 3   Trouble relaxing 2 1 3   Restless 1 2 2   Easily annoyed or irritable 2 2 3   Afraid - awful might happen 2 2 3   Total GAD 7 Score 10 14 19   Anxiety Difficulty Somewhat difficult Somewhat difficult Very difficult      Past Medical  History:  Diagnosis Date  . Anxiety   . Arthritis   . Cancer ([place])   . 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 Diabetes mellitus without complication ([place])   . Gout   . Hyperlipidemia   . Hypertension   . 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 Overactive bladder   . Sleep apnea   . Stroke [facility])    Past Surgical History:  Procedure Laterality Date  . CHOLECYSTECTOMY    . COLOSTOMY    . PROSTATE SURGERY     Social History   Tobacco Use  . Smoking status: Former Research scientist (life sciences)  . Smokeless tobacco: Never Used  Substance Use Topics  . Alcohol use: Not Currently   Family history is unknown by patient.    ROS: negative except as noted in the HPI  Medications: Current Outpatient Medications  Medication Sig Dispense Refill  . allopurinol (ZYLOPRIM) 300 MG tablet Take 1 tablet (300 mg total) by mouth daily. 90 tablet 1  . amLODipine (NORVASC) 5 MG tablet Take 1 tablet (5 mg  total) by mouth daily. 90 tablet 1  . atorvastatin (LIPITOR) 20 MG tablet Take 1 tablet (20 mg total) by mouth at bedtime. 90 tablet 1  . clopidogrel (PLAVIX) 75 MG tablet Take 75 mg by mouth daily.    Marland Kitchen doxepin (SINEQUAN) 25 MG capsule Take 2 capsules (50 mg total) by mouth at bedtime. 60 capsule 2  . doxycycline (VIBRA-TABS) 100 MG tablet Take 1 tablet (100 mg total) by mouth 2 (two) times daily for 7 days. 14 tablet 0  . glimepiride (AMARYL) 2 MG tablet Take 1 tablet (2 mg total) by mouth daily with breakfast. 90 tablet 1  . HYDROcodone-acetaminophen (NORCO/VICODIN) 5-325 MG tablet Take 1 tablet by mouth every 8 (eight) hours as needed for moderate pain. 15 tablet 0  . irbesartan (AVAPRO) 300 MG tablet Take 1 tablet (300 mg total) by mouth daily. 90 tablet 1  . linagliptin (TRADJENTA) 5 MG TABS tablet Take 1 tablet (5 mg total) by mouth daily. 90 tablet 1  . methocarbamol (ROBAXIN) 500 MG tablet Take 1 tablet (500 mg total) by mouth 2 (two) times daily as needed for muscle spasms. 60 tablet 3  . metoprolol  tartrate (LOPRESSOR) 25 MG tablet Take 1 tablet (25 mg total) by mouth 2 (two) times daily. 180 tablet 1  . nitroGLYCERIN (NITROSTAT) 0.6 MG SL tablet Place 0.6 mg under the tongue every 5 (five) minutes as needed for chest pain.    Marland Kitchen omeprazole (PRILOSEC) 20 MG capsule Take 1 capsule (20 mg total) by mouth daily. 90 capsule 1  . oxybutynin (DITROPAN) 5 MG tablet Take 1 tablet (5 mg total) by mouth 2 (two) times daily. 180 tablet 1  . potassium chloride (MICRO-K) 10 MEQ CR capsule Take 10 mEq by mouth daily.     No current facility-administered medications for this visit.    Allergies  Allergen Reactions  . Aspirin Swelling  . Shellfish Allergy   . Sertraline Diarrhea       Objective:  BP 119/60   Pulse 72   Wt 210 lb (95.3 kg)   BMI 31.01 kg/m  Gen:  alert, not ill-appearing, no distress, appropriate for age 1: head normocephalic without obvious abnormality, conjunctiva and cornea clear, trachea midline Pulm: Normal work of breathing, normal phonation Neuro: alert and oriented x 3, no tremor MSK: extremities atraumatic, antalgic gait, ambulating with upright walker Skin: thickened callused skin of the plantar aspect of the left forefoot, appears to be a hematoma at the corn excision site, no surrounding redness, no ulceration or drainage Psych: well-groomed, cooperative, good eye contact, depressed mood, affect full range, speech is articulate, and thought processes clear and goal-directed, no active SI    No results found for this or any previous visit (from the past 61 hour(s)). No results found.    Assessment and Plan: 78 y.o. male with   .[name] was seen today for follow-up.  Diagnoses and all orders for this visit:  Diabetic infection of left foot ([place]) -     Ambulatory referral to Podiatry  Current moderate episode of major depressive disorder without prior episode ([name]) -     doxepin (SINEQUAN) 25 MG capsule; Take 2 capsules (50 mg total) by mouth at  bedtime.  Insomnia due to anxiety and fear -     doxepin (SINEQUAN) 25 MG capsule; Take 2 capsules (50 mg total) by mouth at bedtime.   Depression/Insomnia PHQ9=14, no acute safety issues We contacted the pharmacy and they state Doxepin was dispensed and  Sertraline was not I advised patient to doublecheck his prescription bottles and if he has been taking Sertraline he should cut his dose in half for 1 week before discontinuing Increasing Doxepin to 50 mg QHS Counseled on sleep hygiene  Diabetic foot No evidence of infection on exam, I think pain is due to a small hematoma De-escalating antibiotic therapy. D/c Clindamycin and Flagyl. Finish Doxycycline Provided with cushion for offloading and instructed to use OTC corn pads as needed for offloading Referring to podiatry for further management  Patient education and anticipatory guidance given Patient agrees with treatment plan Follow-up in 8 weeks or sooner as needed if symptoms worsen or fail to improve  Darlyne Russian PA-C

## 2018-03-03 DIAGNOSIS — M48061 Spinal stenosis, lumbar region without neurogenic claudication: Secondary | ICD-10-CM | POA: Diagnosis not present

## 2018-03-15 DIAGNOSIS — M79672 Pain in left foot: Secondary | ICD-10-CM | POA: Diagnosis not present

## 2018-03-15 DIAGNOSIS — E1142 Type 2 diabetes mellitus with diabetic polyneuropathy: Secondary | ICD-10-CM | POA: Diagnosis not present

## 2018-03-15 DIAGNOSIS — L97522 Non-pressure chronic ulcer of other part of left foot with fat layer exposed: Secondary | ICD-10-CM | POA: Diagnosis not present

## 2018-03-17 ENCOUNTER — Encounter: Payer: Self-pay | Admitting: Sports Medicine

## 2018-03-17 ENCOUNTER — Ambulatory Visit (INDEPENDENT_AMBULATORY_CARE_PROVIDER_SITE_OTHER): Payer: Medicare Other | Admitting: Sports Medicine

## 2018-03-17 DIAGNOSIS — L84 Corns and callosities: Secondary | ICD-10-CM

## 2018-03-17 DIAGNOSIS — M170 Bilateral primary osteoarthritis of knee: Secondary | ICD-10-CM

## 2018-03-17 MED ORDER — TRAMADOL HCL 50 MG PO TABS: 50.0000 mg | ORAL_TABLET | Freq: Two times a day (BID) | ORAL | 3 refills | Status: DC

## 2018-03-17 NOTE — Assessment & Plan Note (Signed)
Persistent pain after corn excision about a month and a half ago. The wound has closed completely. We did doxy, Cipro, Flagyl at the last visit. Because he does have some pain at the MTP itself I am going to proceed with an MRI to evaluate for osteomyelitis. X-rays negative at the podiatrist office recently. We may also consider ABIs if no evidence of osteomyelitis.

## 2018-03-17 NOTE — Progress Notes (Addendum)
Subjective:    CC: Follow-up  HPI: Bilateral knee pain: Improved after injections at the last visit.  Foot pain: We excised a corn at the plantar left fifth MTP about 6 weeks ago.  He is slowly improving, he did see the podiatrist who did some Betadine wound care.  Overall improving albeit slowly.  He still has significant pain on the plantar aspect, and there is a question and concern for osteomyelitis.  I reviewed the past medical history, family history, social history, surgical history, and allergies today and no changes were needed.  Please see the problem list section below in epic for further details.  Past Medical History: Past Medical History:  Diagnosis Date  . Anxiety   . Arthritis   . Cancer ([place])   . 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 Diabetes mellitus without complication ([place])   . Gout   . Hyperlipidemia   . Hypertension   . 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 Overactive bladder   . Sleep apnea   . Stroke [facility])    Past Surgical History: Past Surgical History:  Procedure Laterality Date  . CHOLECYSTECTOMY    . COLOSTOMY    . PROSTATE SURGERY     Social History: Social History   Socioeconomic History  . Marital status: Married    Spouse name: Not on file  . Number of children: Not on file  . Years of education: Not on file  . Highest education level: Not on file  Occupational History  . Not on file  Social Needs  . Financial resource strain: Not on file  . Food insecurity:    Worry: Not on file    Inability: Not on file  . Transportation needs:    Medical: Not on file    Non-medical: Not on file  Tobacco Use  . Smoking status: Former Research scientist (life sciences)  . Smokeless tobacco: Never Used  Substance and Sexual Activity  . Alcohol use: Not Currently  . Drug use: Never  . Sexual activity: Not Currently  Lifestyle  . Physical activity:    Days per week: Not on file    Minutes per session: Not on file  . Stress: Not on file  Relationships  .  Social connections:    Talks on phone: Not on file    Gets together: Not on file    Attends religious service: Not on file    Active member of club or organization: Not on file    Attends meetings of clubs or organizations: Not on file    Relationship status: Not on file  Other Topics Concern  . Not on file  Social History Narrative  . Not on file   Family History: Family History  Family history unknown: Yes   Allergies: Allergies  Allergen Reactions  . Aspirin Swelling  . Shellfish Allergy   . Sertraline Diarrhea   Medications: See med rec.  Review of Systems: No fevers, chills, night sweats, weight loss, chest pain, or shortness of breath.   Objective:    General: Well Developed, well nourished, and in no acute distress.  Neuro: Alert and oriented x3, extra-ocular muscles intact, sensation grossly intact.  HEENT: Normocephalic, atraumatic, pupils equal round reactive to light, neck supple, no masses, no lymphadenopathy, thyroid nonpalpable.  Skin: Warm and dry, no rashes. Cardiac: Regular rate and rhythm, no murmurs rubs or gallops, no lower extremity edema.  Respiratory: Clear to auscultation bilaterally. Not using accessory muscles, speaking in full sentences. Left foot: Incision is clean, dry, intact  with there is still exquisite tenderness on the plantar fifth MTP.  Impression and Recommendations:    Primary osteoarthritis of both knees Pain-free now after bilateral injections.  Corn of left foot, plantar fifth MTP Persistent pain after corn excision about a month and a half ago. The wound has closed completely. We did doxy, Cipro, Flagyl at the last visit. Because he does have some pain at the MTP itself I am going to proceed with an MRI to evaluate for osteomyelitis. X-rays negative at the podiatrist office recently. We may also consider ABIs if no evidence of osteomyelitis.  ___________________________________________ Gwen Her. Dianah Field, M.D., ABFM.,  CAQSM. Primary Care and Sports Medicine [facility]  Adjunct Professor of [employer]

## 2018-03-17 NOTE — Assessment & Plan Note (Signed)
Pain-free now after bilateral injections.

## 2018-03-18 LAB — BASIC METABOLIC PANEL
BUN/Creatinine Ratio: 17 (calc) (ref 6–22)
BUN: 25 mg/dL (ref 7–25)
CO2: 19 mmol/L — ABNORMAL LOW (ref 20–32)
Calcium: 9 mg/dL (ref 8.6–10.3)
Chloride: 112 mmol/L — ABNORMAL HIGH (ref 98–110)
Creat: 1.45 mg/dL — ABNORMAL HIGH (ref 0.70–1.18)
Glucose, Bld: 69 mg/dL (ref 65–99)
Potassium: 5.7 mmol/L — ABNORMAL HIGH (ref 3.5–5.3)

## 2018-03-18 LAB — SEDIMENTATION RATE: Sed Rate: 11 mm/h (ref 0–20)

## 2018-03-18 LAB — BASIC METABOLIC PANEL WITH GFR: Sodium: 139 mmol/L (ref 135–146)

## 2018-03-19 ENCOUNTER — Ambulatory Visit: Payer: Medicare Other | Admitting: Sports Medicine

## 2018-03-20 ENCOUNTER — Encounter (HOSPITAL_BASED_OUTPATIENT_CLINIC_OR_DEPARTMENT_OTHER): Payer: Self-pay

## 2018-03-20 ENCOUNTER — Ambulatory Visit (HOSPITAL_BASED_OUTPATIENT_CLINIC_OR_DEPARTMENT_OTHER): Admission: RE | Admit: 2018-03-20 | Payer: Medicare Other | Source: Ambulatory Visit

## 2018-03-22 ENCOUNTER — Telehealth: Payer: Self-pay | Admitting: Sports Medicine

## 2018-03-22 MED ORDER — DIAZEPAM 5 MG PO TABS: ORAL_TABLET | ORAL | 0 refills | Status: DC

## 2018-03-22 NOTE — Telephone Encounter (Signed)
Damon Acosta is having complications getting a CT scan for his foot. They told him, "he needs to get medication to calm him down while the scan is going on"   Damon Acosta came in our office requesting the medication. He also wants a conformation by phone when it is sent in.  Please Advise.

## 2018-03-22 NOTE — Telephone Encounter (Signed)
Adding Valium for preprocedural anxiolysis, he needs a driver if he is going to take the Valium.

## 2018-03-22 NOTE — Addendum Note (Signed)
Addended by: Silverio Decamp on: 03/22/2018 11:28 AM   Modules accepted: Orders

## 2018-03-23 ENCOUNTER — Telehealth: Payer: Self-pay | Admitting: Sports Medicine

## 2018-03-23 NOTE — Telephone Encounter (Signed)
Rx was sent  

## 2018-03-23 NOTE — Telephone Encounter (Signed)
-----   Message from Katha Hamming sent at 03/22/2018 10:34 AM EST ----- Regarding: Damon Acosta could not go through his MRI this past Saturday, 03/20/18.  He has requested that some claustrophobia medication be called into his pharmacy.  He has been r/s to 03/27/18 at 12 pm.  Thanks, Hoyle Sauer

## 2018-03-27 ENCOUNTER — Encounter (HOSPITAL_BASED_OUTPATIENT_CLINIC_OR_DEPARTMENT_OTHER): Payer: Self-pay

## 2018-03-27 ENCOUNTER — Ambulatory Visit (HOSPITAL_BASED_OUTPATIENT_CLINIC_OR_DEPARTMENT_OTHER): Admission: RE | Admit: 2018-03-27 | Payer: Medicare Other | Source: Ambulatory Visit

## 2018-03-28 ENCOUNTER — Other Ambulatory Visit: Payer: Self-pay | Admitting: Physician Assistant

## 2018-03-28 DIAGNOSIS — F321 Major depressive disorder, single episode, moderate: Secondary | ICD-10-CM

## 2018-03-28 DIAGNOSIS — F409 Phobic anxiety disorder, unspecified: Secondary | ICD-10-CM

## 2018-03-28 DIAGNOSIS — F5105 Insomnia due to other mental disorder: Secondary | ICD-10-CM

## 2018-03-29 DIAGNOSIS — M48061 Spinal stenosis, lumbar region without neurogenic claudication: Secondary | ICD-10-CM | POA: Diagnosis not present

## 2018-03-30 ENCOUNTER — Telehealth: Payer: Self-pay

## 2018-03-30 NOTE — Telephone Encounter (Signed)
Medication problem

## 2018-03-30 NOTE — Telephone Encounter (Signed)
Patient advised that the new medication for blood pressure is at the pharmacy.

## 2018-04-02 DIAGNOSIS — B351 Tinea unguium: Secondary | ICD-10-CM | POA: Diagnosis not present

## 2018-04-02 DIAGNOSIS — M2041 Other hammer toe(s) (acquired), right foot: Secondary | ICD-10-CM | POA: Diagnosis not present

## 2018-04-02 DIAGNOSIS — L851 Acquired keratosis [keratoderma] palmaris et plantaris: Secondary | ICD-10-CM | POA: Diagnosis not present

## 2018-04-02 DIAGNOSIS — E1142 Type 2 diabetes mellitus with diabetic polyneuropathy: Secondary | ICD-10-CM | POA: Diagnosis not present

## 2018-04-02 DIAGNOSIS — M2042 Other hammer toe(s) (acquired), left foot: Secondary | ICD-10-CM | POA: Diagnosis not present

## 2018-04-02 DIAGNOSIS — M216X9 Other acquired deformities of unspecified foot: Secondary | ICD-10-CM | POA: Diagnosis not present

## 2018-04-12 ENCOUNTER — Telehealth: Payer: Self-pay | Admitting: Physician Assistant

## 2018-04-13 ENCOUNTER — Ambulatory Visit: Payer: Medicare Other | Admitting: Sports Medicine

## 2018-04-13 ENCOUNTER — Other Ambulatory Visit: Payer: Self-pay

## 2018-04-13 MED ORDER — CLOPIDOGREL BISULFATE 75 MG PO TABS: 75.0000 mg | ORAL_TABLET | Freq: Every day | ORAL | 1 refills | Status: DC

## 2018-04-13 NOTE — Telephone Encounter (Signed)
Refill sent -EH/RMA

## 2018-04-14 ENCOUNTER — Encounter: Payer: Self-pay | Admitting: Sports Medicine

## 2018-04-14 ENCOUNTER — Ambulatory Visit (INDEPENDENT_AMBULATORY_CARE_PROVIDER_SITE_OTHER): Payer: Medicare Other | Admitting: Sports Medicine

## 2018-04-14 DIAGNOSIS — L84 Corns and callosities: Secondary | ICD-10-CM

## 2018-04-14 DIAGNOSIS — M170 Bilateral primary osteoarthritis of knee: Secondary | ICD-10-CM | POA: Diagnosis not present

## 2018-04-14 MED ORDER — TRAMADOL HCL 50 MG PO TABS: 50.0000 mg | ORAL_TABLET | Freq: Three times a day (TID) | ORAL | 0 refills | Status: DC | PRN

## 2018-04-14 NOTE — Assessment & Plan Note (Signed)
Partial and temporary improvement after bilateral knee injections 2 months ago. Having a recurrence of pain, adding tramadol, and I am going get him approved for Orthovisc, return to start injections when approved. We will get him approved for both.

## 2018-04-14 NOTE — Assessment & Plan Note (Signed)
He is now pain-free with regards to the corn excision on his foot.

## 2018-04-14 NOTE — Progress Notes (Signed)
Subjective:    CC: Knee pain  HPI: Damon Acosta returns, he is a pleasant 78 year old male, he is now having worsening of knee pain.  I injected him bilaterally about 2 months ago.  We have not yet got him approved for Orthovisc.  Pain is moderate, worsening, localized at the joint lines without radiation, left worse than right.  No mechanical symptoms, no trauma.  Worsening over the past few weeks.  I reviewed the past medical history, family history, social history, surgical history, and allergies today and no changes were needed.  Please see the problem list section below in epic for further details.  Past Medical History: Past Medical History:  Diagnosis Date  . Anxiety   . Arthritis   . Cancer ([place])   . 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 Diabetes mellitus without complication ([place])   . Gout   . Hyperlipidemia   . Hypertension   . 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 Overactive bladder   . Sleep apnea   . Stroke [facility])    Past Surgical History: Past Surgical History:  Procedure Laterality Date  . CHOLECYSTECTOMY    . COLOSTOMY    . PROSTATE SURGERY     Social History: Social History   Socioeconomic History  . Marital status: Married    Spouse name: Not on file  . Number of children: Not on file  . Years of education: Not on file  . Highest education level: Not on file  Occupational History  . Not on file  Social Needs  . Financial resource strain: Not on file  . Food insecurity:    Worry: Not on file    Inability: Not on file  . Transportation needs:    Medical: Not on file    Non-medical: Not on file  Tobacco Use  . Smoking status: Former Research scientist (life sciences)  . Smokeless tobacco: Never Used  Substance and Sexual Activity  . Alcohol use: Not Currently  . Drug use: Never  . Sexual activity: Not Currently  Lifestyle  . Physical activity:    Days per week: Not on file    Minutes per session: Not on file  . Stress: Not on file  Relationships  . Social  connections:    Talks on phone: Not on file    Gets together: Not on file    Attends religious service: Not on file    Active member of club or organization: Not on file    Attends meetings of clubs or organizations: Not on file    Relationship status: Not on file  Other Topics Concern  . Not on file  Social History Narrative  . Not on file   Family History: Family History  Family history unknown: Yes   Allergies: Allergies  Allergen Reactions  . Aspirin Swelling  . Shellfish Allergy   . Sertraline Diarrhea   Medications: See med rec.  Review of Systems: No fevers, chills, night sweats, weight loss, chest pain, or shortness of breath.   Objective:    General: Well Developed, well nourished, and in no acute distress.  Neuro: Alert and oriented x3, extra-ocular muscles intact, sensation grossly intact.  HEENT: Normocephalic, atraumatic, pupils equal round reactive to light, neck supple, no masses, no lymphadenopathy, thyroid nonpalpable.  Skin: Warm and dry, no rashes. Cardiac: Regular rate and rhythm, no murmurs rubs or gallops, no lower extremity edema.  Respiratory: Clear to auscultation bilaterally. Not using accessory muscles, speaking in full sentences. Left knee: Mild swelling, tender to palpation at the  joint lines. ROM normal in flexion and extension and lower leg rotation. Ligaments with solid consistent endpoints including ACL, PCL, LCL, MCL. Negative Mcmurray's and provocative meniscal tests. Non painful patellar compression. Patellar and quadriceps tendons unremarkable. Hamstring and quadriceps strength is normal.  Impression and Recommendations:    Primary osteoarthritis of both knees Partial and temporary improvement after bilateral knee injections 2 months ago. Having a recurrence of pain, adding tramadol, and I am going get him approved for Orthovisc, return to start injections when approved. We will get him approved for both.  Corn of left foot,  plantar fifth MTP He is now pain-free with regards to the corn excision on his foot. ___________________________________________ Gwen Her. Dianah Field, M.D., ABFM., CAQSM. Primary Care and Sports Medicine  [facility]  Adjunct Professor of [place] of [facility] [employer]

## 2018-04-15 ENCOUNTER — Telehealth: Payer: Self-pay | Admitting: Sports Medicine

## 2018-04-15 NOTE — Telephone Encounter (Signed)
-----   Message from Tasia Catchings, [place] sent at 04/14/2018  1:17 PM EST ----- Information has been submitted to Orthovisc and awaiting determination.   ----- Message ----- From: Silverio Decamp, MD Sent: 04/14/2018   9:48 AM EST To: Tasia Catchings, CMA  Orthovisc approval please, both knees ___________________________________________ Gwen Her. Dianah Field, M.D., ABFM., CAQSM. Primary Care and Sports Medicine [place] [facility]  Adjunct Professor of [place] of [facility] of Medicine

## 2018-04-15 NOTE — Telephone Encounter (Signed)
Received a fax from [place] that both knees were covered under the patient plan and patient would have to pay 20% of his insurance and patient would only want to do the left knee at this time. I have made the first appointment for the left knee injection. Patient will think about doing the right knee at a later time. No PA needed for the injections. Patient was agreeable to the estimated cost per insurance was advised that it was just an estimate. I advised him we would bill to insurance and patient did not have any further questions.

## 2018-04-19 ENCOUNTER — Other Ambulatory Visit: Payer: Self-pay | Admitting: Physician Assistant

## 2018-04-19 DIAGNOSIS — F321 Major depressive disorder, single episode, moderate: Secondary | ICD-10-CM

## 2018-04-19 DIAGNOSIS — F409 Phobic anxiety disorder, unspecified: Secondary | ICD-10-CM

## 2018-04-19 DIAGNOSIS — F5105 Insomnia due to other mental disorder: Secondary | ICD-10-CM

## 2018-04-20 ENCOUNTER — Ambulatory Visit (INDEPENDENT_AMBULATORY_CARE_PROVIDER_SITE_OTHER): Payer: Medicare Other | Admitting: Physician Assistant

## 2018-04-20 ENCOUNTER — Encounter: Payer: Self-pay | Admitting: Physician Assistant

## 2018-04-20 VITALS — BP 131/69 | HR 76 | Wt 213.0 lb

## 2018-04-20 DIAGNOSIS — F5105 Insomnia due to other mental disorder: Secondary | ICD-10-CM

## 2018-04-20 DIAGNOSIS — M170 Bilateral primary osteoarthritis of knee: Secondary | ICD-10-CM | POA: Diagnosis not present

## 2018-04-20 DIAGNOSIS — D509 Iron deficiency anemia, unspecified: Secondary | ICD-10-CM

## 2018-04-20 DIAGNOSIS — M48062 Spinal stenosis, lumbar region with neurogenic claudication: Secondary | ICD-10-CM

## 2018-04-20 DIAGNOSIS — F409 Phobic anxiety disorder, unspecified: Secondary | ICD-10-CM

## 2018-04-20 DIAGNOSIS — F418 Other specified anxiety disorders: Secondary | ICD-10-CM

## 2018-04-20 DIAGNOSIS — G3184 Mild cognitive impairment, so stated: Secondary | ICD-10-CM

## 2018-04-20 MED ORDER — TRAMADOL HCL 50 MG PO TABS: 50.0000 mg | ORAL_TABLET | Freq: Four times a day (QID) | ORAL | 0 refills | Status: DC | PRN

## 2018-04-20 MED ORDER — TRAMADOL HCL 50 MG PO TABS: 50.0000 mg | ORAL_TABLET | Freq: Three times a day (TID) | ORAL | 0 refills | Status: DC | PRN

## 2018-04-20 MED ORDER — KETOROLAC TROMETHAMINE 30 MG/ML IJ SOLN: 30.0000 mg | Freq: Once | INTRAMUSCULAR | Status: DC

## 2018-04-20 MED ORDER — ACETAMINOPHEN ER 650 MG PO TBCR: 650.0000 mg | EXTENDED_RELEASE_TABLET | Freq: Three times a day (TID) | ORAL | 3 refills | Status: DC | PRN

## 2018-04-20 MED ORDER — KETOROLAC TROMETHAMINE 15 MG/ML IJ SOLN
15.0000 mg | Freq: Once | INTRAMUSCULAR | Status: AC
  Administered 2018-04-20: 15 mg via INTRAMUSCULAR

## 2018-04-20 NOTE — Progress Notes (Signed)
HPI:                                                                Damon Acosta is a 78 y.o. male who presents to [place]: [place] today for follow-up  Interval Hx 04/20/2018 - he was switched from Sertraline to Doxepin. He is experiencing dry mouth since increasing his dose to 50 mg. He would like to discontinue the medication because the dry mouth is very bothersome  -Reports a fall at [employer] approx 1 week ago. He lost his balance inside the store. Denies any injury or LOC. Remembers the fall. History of frequent falls - he is having a lot of back pain today. Reports he took Tramadol 50 mg x 1 today, but it has not been helpful - reports he accidentally missed his gen surg appt to discuss colostomy reversal. He would like me to re-schedule this for him - states wife is concerned that he has memory difficulties. Would like to be screened for dementia - he was seen by Podiatry on 04/02/18 for diabetic foot care and diabetic shoes  02/23/2018 - switched from Sertraline due to loose stools. He states that the pharmacy gave him a refill of Sertraline and he thinks maybe he is still taking this - he started Doxepin 25 mg in the evening and states he is sleeping much better - he overall feels his mood has improved compared to 2 months ago - continues to endorses feeling like he would be better off dead, but denies suicidal thoughts or self harm  On 02/17/2018 sports medicine started him on triple antibiotic therapy for possible diabetic foot infection related to corn removal. Corn was excised from left forefoot on 01/26/2018. He continues to complain of persistent left foot pain at the sight of the corn removal. It is worse with weight bearing. Pain does not wake him up at night. There has been no drainage from the wound. Depression screen [facility] 2/9 02/23/2018 01/26/2018 12/29/2017  Decreased Interest '2 1 3  '$ Down, Depressed, Hopeless '1 1 3  '$ PHQ - 2  Score '3 2 6  '$ Altered sleeping '2 3 3  '$ Tired, decreased energy '2 2 1  '$ Change in appetite 1 2 0  Feeling bad or failure about yourself  '2 1 3  '$ Trouble concentrating 1 0 0  Moving slowly or fidgety/restless 1 0 1  Suicidal thoughts 2 0 2  PHQ-9 Score '14 10 16  '$ Difficult doing work/chores Not difficult at all Not difficult at all Somewhat difficult    GAD 7 : Generalized Anxiety Score 02/23/2018 01/26/2018 01/06/2018  Nervous, Anxious, on Edge '1 3 2  '$ Control/stop worrying '1 2 3  '$ Worry too much - different things '1 2 3  '$ Trouble relaxing '2 1 3  '$ Restless '1 2 2  '$ Easily annoyed or irritable '2 2 3  '$ Afraid - awful might happen '2 2 3  '$ Total GAD 7 Score '10 14 19  '$ Anxiety Difficulty Somewhat difficult Somewhat difficult Very difficult      Past Medical History:  Diagnosis Date  . Anxiety   . Arthritis   . Cancer ([place])   . 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 Diabetes mellitus without complication ([place])   . Gout   .  Hyperlipidemia   . Hypertension   . 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 Overactive bladder   . Sleep apnea   . Stroke [facility])    Past Surgical History:  Procedure Laterality Date  . CHOLECYSTECTOMY    . COLOSTOMY    . PROSTATE SURGERY     Social History   Tobacco Use  . Smoking status: Former Research scientist (life sciences)  . Smokeless tobacco: Never Used  Substance Use Topics  . Alcohol use: Not Currently   Family history is unknown by patient.    Review of Systems  Musculoskeletal: Positive for back pain and gait problem.     Medications: Current Outpatient Medications  Medication Sig Dispense Refill  . acetaminophen (TYLENOL) 650 MG CR tablet Take 1-2 tablets (650-1,300 mg total) by mouth every 8 (eight) hours as needed for pain. 90 tablet 3  . allopurinol (ZYLOPRIM) 300 MG tablet Take 1 tablet (300 mg total) by mouth daily. 90 tablet 1  . amLODipine (NORVASC) 5 MG tablet Take 1 tablet (5 mg total) by mouth daily. 90 tablet 1  . atorvastatin (LIPITOR) 20 MG tablet  Take 1 tablet (20 mg total) by mouth at bedtime. 90 tablet 1  . clopidogrel (PLAVIX) 75 MG tablet Take 1 tablet (75 mg total) by mouth daily. 90 tablet 1  . glimepiride (AMARYL) 2 MG tablet Take 1 tablet (2 mg total) by mouth daily with breakfast. 90 tablet 1  . irbesartan (AVAPRO) 300 MG tablet Take 1 tablet (300 mg total) by mouth daily. 90 tablet 1  . linagliptin (TRADJENTA) 5 MG TABS tablet Take 1 tablet (5 mg total) by mouth daily. 90 tablet 1  . methocarbamol (ROBAXIN) 500 MG tablet Take 1 tablet (500 mg total) by mouth 2 (two) times daily as needed for muscle spasms. 60 tablet 3  . metoprolol tartrate (LOPRESSOR) 25 MG tablet Take 1 tablet (25 mg total) by mouth 2 (two) times daily. 180 tablet 1  . nitroGLYCERIN (NITROSTAT) 0.6 MG SL tablet Place 0.6 mg under the tongue every 5 (five) minutes as needed for chest pain.    Marland Kitchen omeprazole (PRILOSEC) 20 MG capsule Take 1 capsule (20 mg total) by mouth daily. 90 capsule 1  . oxybutynin (DITROPAN) 5 MG tablet Take 1 tablet (5 mg total) by mouth 2 (two) times daily. 180 tablet 1  . potassium chloride (MICRO-K) 10 MEQ CR capsule Take 10 mEq by mouth daily.    . traMADol (ULTRAM) 50 MG tablet Take 1-1.5 tablets (50-75 mg total) by mouth every 6 (six) hours as needed for moderate pain or severe pain. Maximum 6 tabs per day. 90 tablet 0   No current facility-administered medications for this visit.    Allergies  Allergen Reactions  . Aspirin Swelling  . Shellfish Allergy   . Doxepin Other (See Comments)    Dry mouth  . Sertraline Diarrhea       Objective:  BP 131/69   Pulse 76   Wt 213 lb (96.6 kg)   BMI 31.45 kg/m  Gen:  alert, not ill-appearing, no distress, appropriate for age, obese male HEENT: head normocephalic without obvious abnormality, conjunctiva and cornea clear, trachea midline Pulm: Normal work of breathing, normal phonation Neuro: alert and oriented x 3, no tremor MSK: extremities atraumatic, slowed antalgic gait,  ambulating with upright walker  Psych: well-groomed, cooperative, good eye contact, euthymic mood, affect mood-congruent, speech is articulate, and thought processes clear and goal-directed   Lab Results  Component Value Date   CREATININE 1.45 (H)  03/17/2018   BUN 25 03/17/2018   NA 139 03/17/2018   K 5.7 (H) 03/17/2018   CL 112 (H) 03/17/2018   CO2 19 (L) 03/17/2018   Lab Results  Component Value Date   ALT 14 12/15/2017   AST 16 12/15/2017   BILITOT 0.5 12/15/2017   Lab Results  Component Value Date   WBC 8.6 12/15/2017   HGB 11.4 (L) 12/15/2017   HCT 36.8 (L) 12/15/2017   MCV 77.3 (L) 12/15/2017   PLT 185 12/15/2017    Montreal Cognitive Assessment  04/20/2018  Visuospatial/ Executive (0/5) 2  Naming (0/3) 2  Attention: Read list of digits (0/2) 2  Attention: Read list of letters (0/1) 1  Attention: Serial 7 subtraction starting at 100 (0/3) 3  Language: Repeat phrase (0/2) 2  Language : Fluency (0/1) 1  Abstraction (0/2) 2  Delayed Recall (0/5) 3  Orientation (0/6) 6  Total 24  Adjusted Score (based on education) 24     Assessment and Plan: 78 y.o. male with   .Zalman was seen today for medication management.  Diagnoses and all orders for this visit:  Mild cognitive impairment Comments: 04/20/18 - MOCA=24 Orders: -     Fe+TIBC+Fer -     CBC with Differential/Platelet -     RPR -     B12 and Folate Panel  Spinal stenosis of lumbar region with neurogenic claudication -     Discontinue: ketorolac (TORADOL) 30 MG/ML injection 30 mg -     Discontinue: traMADol (ULTRAM) 50 MG tablet; Take 1-1.5 tablets (50-75 mg total) by mouth every 8 (eight) hours as needed for moderate pain or severe pain. Maximum 6 tabs per day. -     acetaminophen (TYLENOL) 650 MG CR tablet; Take 1-2 tablets (650-1,300 mg total) by mouth every 8 (eight) hours as needed for pain. -     traMADol (ULTRAM) 50 MG tablet; Take 1-1.5 tablets (50-75 mg total) by mouth every 6 (six) hours as  needed for moderate pain or severe pain. Maximum 6 tabs per day. -     ketorolac (TORADOL) 15 MG/ML injection 15 mg  Primary osteoarthritis of both knees -     Discontinue: traMADol (ULTRAM) 50 MG tablet; Take 1-1.5 tablets (50-75 mg total) by mouth every 8 (eight) hours as needed for moderate pain or severe pain. Maximum 6 tabs per day. -     acetaminophen (TYLENOL) 650 MG CR tablet; Take 1-2 tablets (650-1,300 mg total) by mouth every 8 (eight) hours as needed for pain. -     traMADol (ULTRAM) 50 MG tablet; Take 1-1.5 tablets (50-75 mg total) by mouth every 6 (six) hours as needed for moderate pain or severe pain. Maximum 6 tabs per day.  Insomnia due to anxiety and fear  Anxiety about health  Microcytic anemia -     Fe+TIBC+Fer -     CBC with Differential/Platelet -     B12 and Folate Panel     Anxiety/Insomnia - discontinuing Doxepin due to severe dry mouth (likely compounded by Oxybutynin use). Sertraline was also not tolerated due to loose stools. Discussed option to trial a third medication versus active surveillance. Will try to avoid CNS depressants due to polypharmacy, age, and high fall risk. Patient opted to monitor and consider medication if symptoms worsen   Mild Cognitive Impairment - MOCA score=24 today (no baseline for comparison), namely struggled with visuospatial/executive functions - normal ESR, TSH, BMP - Microcytic anemia (Hgb 11.4) noted 4 months ago, repeat  iron panel and CBC today. Will also add RPR  Microcytic anemia - FOBT stool cards provided. If he is unable to perform home stool test, we will do a POC test in the office - iron panel pending  Back pain - Toradol 15 mg IM given in office today - instructed to increase Tramadol to 1.5 tabs (75 mg) every 6 hours as needed - also instructed to start Tylenol arthritis [phone] mg Q8H prn - avoid NSAIDs due to renal insufficiency and Plavix use  Patient education and anticipatory guidance given Patient  agrees with treatment plan Follow-up in 3 months or sooner as needed if symptoms worsen or fail to improve  Darlyne Russian PA-C

## 2018-04-20 NOTE — Patient Instructions (Addendum)
[facility] at [phone] to re-schedule your appointment to discuss colostomy reversal surgery  You can stop Doxepin at bedtime. If you are taking 2 capsules you should decrease to 1 capsule for 1 week and then stop  You can increase your Tramadol to 1.5 tablets every 6 hours as needed for pain You can take Tylenol arthritis strength [phone] mg every 8 hours as needed for pain Keep you follow-up appointment with Dr. Darene Lamer

## 2018-04-21 ENCOUNTER — Ambulatory Visit (INDEPENDENT_AMBULATORY_CARE_PROVIDER_SITE_OTHER): Payer: Medicare Other | Admitting: Sports Medicine

## 2018-04-21 ENCOUNTER — Other Ambulatory Visit: Payer: Self-pay

## 2018-04-21 DIAGNOSIS — G3184 Mild cognitive impairment, so stated: Secondary | ICD-10-CM | POA: Diagnosis not present

## 2018-04-21 DIAGNOSIS — D509 Iron deficiency anemia, unspecified: Secondary | ICD-10-CM | POA: Diagnosis not present

## 2018-04-21 DIAGNOSIS — M170 Bilateral primary osteoarthritis of knee: Secondary | ICD-10-CM | POA: Diagnosis not present

## 2018-04-21 NOTE — Progress Notes (Signed)
   Procedure: Real-time Ultrasound Guided injection of the left knee Device: GE Logiq E  Verbal informed consent obtained.  Time-out conducted.  Noted no overlying erythema, induration, or other signs of local infection.  Skin prepped in a sterile fashion.  Local anesthesia: Topical Ethyl chloride.  With sterile technique and under real time ultrasound guidance:  30 mg/2 mL of OrthoVisc (sodium hyaluronate) in a prefilled syringe was injected easily into the knee through a 22-gauge needle. Completed without difficulty  Pain immediately resolved suggesting accurate placement of the medication.  Advised to call if fevers/chills, erythema, induration, drainage, or persistent bleeding.  Images permanently stored and available for review in the ultrasound unit.  Impression: Technically successful ultrasound guided injection. 

## 2018-04-21 NOTE — Assessment & Plan Note (Signed)
Orthovisc injection #1 of 4 into the left knee, return in 1 week for #2 of 4

## 2018-04-22 LAB — BASIC METABOLIC PANEL WITH GFR
BUN/Creatinine Ratio: 13 (calc) (ref 6–22)
BUN: 23 mg/dL (ref 7–25)
CO2: 16 mmol/L — ABNORMAL LOW (ref 20–32)
Calcium: 9.3 mg/dL (ref 8.6–10.3)
Chloride: 110 mmol/L (ref 98–110)
Creat: 1.72 mg/dL — ABNORMAL HIGH (ref 0.70–1.18)
GFR, Est African American: 43 mL/min/{1.73_m2} — ABNORMAL LOW (ref >=60)
GFR, Est Non African American: 37 mL/min/{1.73_m2} — ABNORMAL LOW (ref >=60)
Glucose, Bld: 93 mg/dL (ref 65–99)
Potassium: 4.8 mmol/L (ref 3.5–5.3)
SODIUM: 140 mmol/L (ref 135–146)

## 2018-04-22 LAB — IRON,TIBC AND FERRITIN PANEL
%SAT: 38 % (ref 20–48)
Ferritin: 234 ng/mL (ref 24–380)
Iron: 76 ug/dL (ref 50–180)
TIBC: 201 mcg/dL (calc) — ABNORMAL LOW (ref 250–425)

## 2018-04-22 LAB — B12 AND FOLATE PANEL
Folate: 15.3 ng/mL
Vitamin B-12: 619 pg/mL (ref 200–1100)

## 2018-04-22 LAB — CBC WITH DIFFERENTIAL/PLATELET
Absolute Monocytes: 581 cells/uL (ref 200–950)
BASOS ABS: 33 {cells}/uL (ref 0–200)
Basophils Relative: 0.5 %
EOS ABS: 290 {cells}/uL (ref 15–500)
Eosinophils Relative: 4.4 %
HCT: 34 % — ABNORMAL LOW (ref 38.5–50.0)
Hemoglobin: 10.4 g/dL — ABNORMAL LOW (ref 13.2–17.1)
Lymphs Abs: 1795 cells/uL (ref 850–3900)
MCH: 24.1 pg — AB (ref 27.0–33.0)
MCHC: 30.6 g/dL — ABNORMAL LOW (ref 32.0–36.0)
MCV: 78.7 fL — ABNORMAL LOW (ref 80.0–100.0)
Monocytes Relative: 8.8 %
Neutro Abs: 3901 cells/uL (ref 1500–7800)
Neutrophils Relative %: 59.1 %
Platelets: 199 10*3/uL (ref 140–400)
RBC: 4.32 10*6/uL (ref 4.20–5.80)
RDW: 15.7 % — ABNORMAL HIGH (ref 11.0–15.0)
Total Lymphocyte: 27.2 %
WBC: 6.6 10*3/uL (ref 3.8–10.8)

## 2018-04-22 LAB — RPR: RPR Ser Ql: NONREACTIVE

## 2018-04-22 LAB — TEST AUTHORIZATION

## 2018-04-23 ENCOUNTER — Other Ambulatory Visit: Payer: Self-pay | Admitting: Physician Assistant

## 2018-04-23 DIAGNOSIS — N183 Chronic kidney disease, stage 3 (moderate): Principal | ICD-10-CM

## 2018-04-23 DIAGNOSIS — N179 Acute kidney failure, unspecified: Secondary | ICD-10-CM

## 2018-04-23 NOTE — Progress Notes (Signed)
Please schedule for appointment next week Anemia has worsened. He will need to return his stool cards to make sure he is not bleeding from his GI tract He should stop his Clopidogrel (Plavix) We will also need to do some additional lab tests Kidney function has also decreased. I am going to refer to Nephrology for this

## 2018-04-27 ENCOUNTER — Encounter: Payer: Self-pay | Admitting: Physician Assistant

## 2018-04-27 ENCOUNTER — Ambulatory Visit (INDEPENDENT_AMBULATORY_CARE_PROVIDER_SITE_OTHER): Payer: Medicare Other | Admitting: Physician Assistant

## 2018-04-27 ENCOUNTER — Other Ambulatory Visit: Payer: Self-pay

## 2018-04-27 VITALS — BP 128/69 | HR 90 | Temp 98.1°F | Resp 14 | Wt 211.0 lb

## 2018-04-27 DIAGNOSIS — E1159 Type 2 diabetes mellitus with other circulatory complications: Secondary | ICD-10-CM | POA: Diagnosis not present

## 2018-04-27 DIAGNOSIS — K921 Melena: Secondary | ICD-10-CM | POA: Diagnosis not present

## 2018-04-27 DIAGNOSIS — D509 Iron deficiency anemia, unspecified: Secondary | ICD-10-CM | POA: Diagnosis not present

## 2018-04-27 DIAGNOSIS — I10 Essential (primary) hypertension: Secondary | ICD-10-CM | POA: Diagnosis not present

## 2018-04-27 DIAGNOSIS — R7989 Other specified abnormal findings of blood chemistry: Secondary | ICD-10-CM

## 2018-04-27 DIAGNOSIS — R1013 Epigastric pain: Secondary | ICD-10-CM

## 2018-04-27 DIAGNOSIS — N183 Chronic kidney disease, stage 3 unspecified: Secondary | ICD-10-CM | POA: Insufficient documentation

## 2018-04-27 DIAGNOSIS — E1122 Type 2 diabetes mellitus with diabetic chronic kidney disease: Secondary | ICD-10-CM | POA: Diagnosis not present

## 2018-04-27 DIAGNOSIS — I152 Hypertension secondary to endocrine disorders: Secondary | ICD-10-CM

## 2018-04-27 LAB — HEMOCCULT GUIAC POC 1CARD (OFFICE)
Card #2 Fecal Occult Blod, POC: NEGATIVE
FECAL OCCULT BLD: NEGATIVE
Fecal Occult Blood, POC: NEGATIVE

## 2018-04-27 MED ORDER — LINAGLIPTIN 5 MG PO TABS: 5.0000 mg | ORAL_TABLET | Freq: Every day | ORAL | 1 refills | Status: DC

## 2018-04-27 MED ORDER — METOPROLOL TARTRATE 25 MG PO TABS: 25.0000 mg | ORAL_TABLET | Freq: Two times a day (BID) | ORAL | 1 refills | Status: DC

## 2018-04-27 MED ORDER — OMEPRAZOLE 20 MG PO CPDR: 20.0000 mg | DELAYED_RELEASE_CAPSULE | Freq: Two times a day (BID) | ORAL | 0 refills | Status: DC

## 2018-04-27 NOTE — Patient Instructions (Addendum)
Continue to avoid taking your Plavix Avoid OTC pain relievers like Ibuprofen, Aleve, Aspirin (Tylenol is OKAY) Increase Omeprazole to 20 mg twice a day  Hemoglobinopathy Evaluation Test Why am I having this test? A hemoglobinopathy evaluation test is a blood test to check for certain blood disorders that are passed down through families (hemoglobinopathies). You may need this test if you:  Are at risk because of your ethnic background or family history.  Want to know if you may pass a gene for a blood disorder onto your child.  Have unexplained symptoms such as weakness, fatigue, yellow skin or eyes (jaundice), or pale skin, and your health care provider suspects a hemoglobinopathy.  Have abnormal results on a complete blood test (CBC) or a blood test that looks at blood cells under a microscope (peripheral smear). This test is also included in newborn screening tests that are done to look for hemoglobin disorders in newborns. What is being tested? Hemoglobin (Hgb) is a type of protein in the blood that carries oxygen. This test measures the amount of normal Hgb in your blood and checks for abnormal forms of Hgb (variants). Normal Hgb includes:  Hgb A1.  Hgb A2.  Hgb F. Common Hgb variants are:  Hgb S.  Hgb C.  Hgb E. Less common variants are:  Hgb F.  Hgb H.  Hgb [place].  Hgb D.  Hgb G.  Hgb J.  Hgb M.  Hgb [place].  Mixed variants. The hemoglobinopathy evaluation test may involve several methods to check a blood sample for hemoglobin abnormalities and variants. These may include:  A hemoglobin solubility test to check for hemoglobin S.  Hemoglobin electrophoresis.  Hemoglobin isoelectric focusing.  High performance liquid chromatography.  Capillary zone electrophoresis.  Mass spectrometry. The method used varies by lab and depends on the type of hemoglobinopathy that you are being tested for. What kind of sample is taken?     A blood  sample is required for this test. It is usually collected by inserting a needle into a blood vessel. In newborns, a blood sample may be collected with a heel stick. Tell a health care provider about:  Any blood transfusions you may have had in the past few months.  Any allergies you have.  All medicines you are taking, including vitamins, herbs, eye drops, creams, and over-the-counter medicines.  Any surgeries you have had.  Any blood disorders you have.  Whether you are pregnant or may be pregnant. How are the results reported? Your test results will report the amounts of normal and abnormal Hgb by percentage. What do the results mean? Normal Hgb results include:  Hgb A1: 95-98%.  Hgb A2: 2-3%.  Hgb F: 2% or less. A hemoglobin evaluation test may show abnormal forms, combinations, or elevations in the percentage of certain types of Hgb that are associated with certain medical conditions. These may include:  Sickle cell disease. Majority increase in Hgb S, no Hgb A1, and increased Hgb F.  Sickle cell trait. Slight decrease in Hgb A1 and about 40% increase in Hgb S.  Hemoglobin C disease. Majority increase in Hgb C and no Hgb A1.  Beta thalassemia major. Majority increase in Hgb F and little or no Hgb A1.  Beta thalassemia minor. Majority increase in Hgb A1, 4-8% increase in Hgb A2, and slightly increased Hgb F.  Hemoglobin H disease (also called alpha thalassemia). Majority increase in Hgb A1 and slight increase in Hgb H.  Hemoglobin E disease. Majority increase in  Hgb E. To diagnose a hemoglobinopathy, your test results will be considered along with other tests including CBC, blood smear, reticulocyte count (the number of immature red blood cells), iron levels, and genetic testing. Your health care provider may refer you to a hematology specialist to discuss what your individual results mean. Questions to ask your health care provider: Ask your health care provider, or the  department that is doing the test:  When will my results be ready?  How will I get my results?  What other tests do I need?  What are my treatment options?  What are my next steps? Summary  A hemoglobinopathy evaluation test is a blood test to check for certain blood disorders that are passed down through families (hemoglobinopathies).  Hemoglobin (Hgb) is a type of protein in the blood that carries oxygen. This test measures the amount of normal Hgb in your blood and checks for abnormal forms of Hgb (variants).  You may need this test if you are at risk of a hemoglobinopathy or if you have unexplained symptoms and your health care provider suspects a hemoglobinopathy. This information is not intended to replace advice given to you by your health care provider. Make sure you discuss any questions you have with your health care provider. Document Released: 03/01/2004 Document Revised: 02/06/2017 Document Reviewed: 02/06/2017 Elsevier Interactive Patient Education  2019 [employer]   Anemia  Anemia is a condition in which you do not have enough red blood cells or hemoglobin. Hemoglobin is a substance in red blood cells that carries oxygen. When you do not have enough red blood cells or hemoglobin (are anemic), your body cannot get enough oxygen and your organs may not work properly. As a result, you may feel very tired or have other problems. What are the causes? Common causes of anemia include:  Excessive bleeding. Anemia can be caused by excessive bleeding inside or outside the body, including bleeding from the intestine or from periods in women.  Poor nutrition.  Long-lasting (chronic) kidney, thyroid, and liver disease.  Bone marrow disorders.  Cancer and treatments for cancer.  HIV (human immunodeficiency virus) and AIDS (acquired immunodeficiency syndrome).  Treatments for HIV and AIDS.  Spleen problems.  Blood disorders.  Infections, medicines, and autoimmune  disorders that destroy red blood cells. What are the signs or symptoms? Symptoms of this condition include:  Minor weakness.  Dizziness.  Headache.  Feeling heartbeats that are irregular or faster than normal (palpitations).  Shortness of breath, especially with exercise.  Paleness.  Cold sensitivity.  Indigestion.  Nausea.  Difficulty sleeping.  Difficulty concentrating. Symptoms may occur suddenly or develop slowly. If your anemia is mild, you may not have symptoms. How is this diagnosed? This condition is diagnosed based on:  Blood tests.  Your medical history.  A physical exam.  Bone marrow biopsy. Your health care provider may also check your stool (feces) for blood and may do additional testing to look for the cause of your bleeding. You may also have other tests, including:  Imaging tests, such as a CT scan or MRI.  Endoscopy.  Colonoscopy. How is this treated? Treatment for this condition depends on the cause. If you continue to lose a lot of blood, you may need to be treated at a hospital. Treatment may include:  Taking supplements of iron, vitamin H41, or folic acid.  Taking a hormone medicine (erythropoietin) that can help to stimulate red blood cell growth.  Having a blood transfusion. This may be needed  if you lose a lot of blood.  Making changes to your diet.  Having surgery to remove your spleen. Follow these instructions at home:  Take over-the-counter and prescription medicines only as told by your health care provider.  Take supplements only as told by your health care provider.  Follow any diet instructions that you were given.  Keep all follow-up visits as told by your health care provider. This is important. Contact a health care provider if:  You develop new bleeding anywhere in the body. Get help right away if:  You are very weak.  You are short of breath.  You have pain in your abdomen or chest.  You are dizzy or feel  faint.  You have trouble concentrating.  You have bloody or black, tarry stools.  You vomit repeatedly or you vomit up blood. Summary  Anemia is a condition in which you do not have enough red blood cells or enough of a substance in your red blood cells that carries oxygen (hemoglobin).  Symptoms may occur suddenly or develop slowly.  If your anemia is mild, you may not have symptoms.  This condition is diagnosed with blood tests as well as a medical history and physical exam. Other tests may be needed.  Treatment for this condition depends on the cause of the anemia. This information is not intended to replace advice given to you by your health care provider. Make sure you discuss any questions you have with your health care provider. Document Released: 03/06/2004 Document Revised: 02/29/2016 Document Reviewed: 02/29/2016 Elsevier Interactive Patient Education  2019 [employer].

## 2018-04-27 NOTE — Progress Notes (Signed)
HPI:                                                                Damon Acosta is a 78 y.o. male who presents to [facility]: [place] today for follow-up anemia  At last OV, noted a 1.0 drop in hemoglobin over a 4 month period. MCV was decreased at 78.7, normal B12, folate, and iron indices. Normal WBC and platelets. Plavix was held He completed FOBT x 3 at home   Reports he has felt "a little more winded" and fatigued than usual. Endorses recurrent melena, intermittent, last episode was 2 weeks ago. Denies hx of anemia but in reviewing his records from prior PCP Hgb was 11.1, MCV 78, ferritin 205 and iron 48 (10/20/2017)  He was also found to have worsening renal function, GFR 39, and referred to Nephrology. Baseline Scr 1.27-1.42, GFR 69-70 12/15/17 Scr 1.50 03/17/18 Scr 1.45 04/21/18 Scr 1.72, GFR 43  Past Medical History:  Diagnosis Date  . Anxiety   . Arthritis   . Cancer ([place])   . 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 Diabetes mellitus without complication ([place])   . Gout   . Hyperlipidemia   . Hypertension   . 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 Overactive bladder   . Sleep apnea   . Stroke [facility])    Past Surgical History:  Procedure Laterality Date  . CHOLECYSTECTOMY    . COLOSTOMY    . PROSTATE SURGERY     Social History   Tobacco Use  . Smoking status: Former Research scientist (life sciences)  . Smokeless tobacco: Never Used  Substance Use Topics  . Alcohol use: Not Currently   Family history is unknown by patient.    ROS: negative except as noted in the HPI  Medications: Current Outpatient Medications  Medication Sig Dispense Refill  . acetaminophen (TYLENOL) 650 MG CR tablet Take 1-2 tablets (650-1,300 mg total) by mouth every 8 (eight) hours as needed for pain. 90 tablet 3  . allopurinol (ZYLOPRIM) 300 MG tablet Take 1 tablet (300 mg total) by mouth daily. 90 tablet 1  . amLODipine (NORVASC) 5 MG tablet Take 1 tablet (5 mg  total) by mouth daily. 90 tablet 1  . atorvastatin (LIPITOR) 20 MG tablet Take 1 tablet (20 mg total) by mouth at bedtime. 90 tablet 1  . glimepiride (AMARYL) 2 MG tablet Take 1 tablet (2 mg total) by mouth daily with breakfast. 90 tablet 1  . irbesartan (AVAPRO) 300 MG tablet Take 1 tablet (300 mg total) by mouth daily. 90 tablet 1  . linagliptin (TRADJENTA) 5 MG TABS tablet Take 1 tablet (5 mg total) by mouth daily. 90 tablet 1  . methocarbamol (ROBAXIN) 500 MG tablet Take 1 tablet (500 mg total) by mouth 2 (two) times daily as needed for muscle spasms. 60 tablet 3  . metoprolol tartrate (LOPRESSOR) 25 MG tablet Take 1 tablet (25 mg total) by mouth 2 (two) times daily. 180 tablet 1  . nitroGLYCERIN (NITROSTAT) 0.6 MG SL tablet Place 0.6 mg under the tongue every 5 (five) minutes as needed for chest pain.    Marland Kitchen omeprazole (PRILOSEC) 20 MG capsule Take 1 capsule (20 mg total) by mouth 2 (two) times daily. 180 capsule 0  .  oxybutynin (DITROPAN) 5 MG tablet Take 1 tablet (5 mg total) by mouth 2 (two) times daily. 180 tablet 1  . potassium chloride (MICRO-K) 10 MEQ CR capsule Take 10 mEq by mouth daily.    . traMADol (ULTRAM) 50 MG tablet Take 1-1.5 tablets (50-75 mg total) by mouth every 6 (six) hours as needed for moderate pain or severe pain. Maximum 6 tabs per day. 90 tablet 0   No current facility-administered medications for this visit.     Allergies  Allergen Reactions  . Aspirin Swelling  . Shellfish Allergy   . Doxepin Other (See Comments)    Dry mouth  . Sertraline Diarrhea       Objective:  BP 128/69   Pulse 90   Temp 98.1 F (36.7 C)   Resp 14   Wt 211 lb (95.7 kg)   SpO2 99%   BMI 31.16 kg/m  Gen:  alert, not ill-appearing, no distress, appropriate for age HEENT: head normocephalic without obvious abnormality, conjunctiva and cornea clear, trachea midline Pulm: Normal work of breathing, normal phonation Neuro: alert and oriented x 3, no tremor MSK: extremities  atraumatic, ambulating in upright walker Skin: intact, no rashes on exposed skin, no jaundice, no cyanosis  Lab Results  Component Value Date   WBC 9.2 04/27/2018   HGB 10.7 (L) 04/27/2018   HCT 33.9 (L) 04/27/2018   MCV 78.1 (L) 04/27/2018   PLT 192 04/27/2018   Lab Results  Component Value Date   IRON 76 04/21/2018   TIBC 201 (L) 04/21/2018   FERRITIN 234 04/21/2018   Lab Results  Component Value Date   CREATININE 1.72 (H) 04/21/2018   BUN 23 04/21/2018   NA 140 04/21/2018   K 4.8 04/21/2018   CL 110 04/21/2018   CO2 16 (L) 04/21/2018   Estimated Creatinine Clearance: 39.8 mL/min (A) (by C-G formula based on SCr of 1.72 mg/dL (H)).   Results for orders placed or performed in visit on 04/27/18 (from the past 72 hour(s))  Reticulocytes     Status: None   Collection Time: 04/27/18 10:37 AM  Result Value Ref Range   Retic Ct Pct 1.0 %   ABS Retic 43,400 25,000 - 9,000 cells/uL  CBC     Status: Abnormal   Collection Time: 04/27/18 10:37 AM  Result Value Ref Range   WBC 9.2 3.8 - 10.8 Thousand/uL   RBC 4.34 4.20 - 5.80 Million/uL   Hemoglobin 10.7 (L) 13.2 - 17.1 g/dL   HCT 33.9 (L) 38.5 - 50.0 %   MCV 78.1 (L) 80.0 - 100.0 fL   MCH 24.7 (L) 27.0 - 33.0 pg   MCHC 31.6 (L) 32.0 - 36.0 g/dL   RDW 15.8 (H) 11.0 - 15.0 %   Platelets 192 140 - 400 Thousand/uL   MPV  7.5 - 12.5 fL    Comment: Due to platelet or RBC variability in size or shape the result cannot be reported accurately.   POCT occult blood stool     Status: Normal   Collection Time: 04/27/18  4:30 PM  Result Value Ref Range   Fecal Occult Blood, POC Negative Negative   Card #1 Date 04/23/2018    Card #2 Fecal Occult Blod, POC Negative    Card #2 Date 04/24/2018    Card #3 Fecal Occult Blood, POC Negative    Card #3 Date 04/25/2018    No results found.    Assessment and Plan: 78 y.o. male with   .Damon Acosta was  seen today for follow-up.  Diagnoses and all orders for this visit:  Microcytic  anemia -     Reticulocytes -     Hemoglobinopathy Evaluation -     CBC -     Cancel: POC Hemoccult Bld/Stl (3-Cd Home Screen); Future -     Cancel: POC Hemoccult Bld/Stl (1-Cd Office Dx) -     Cancel: POCT occult blood stool -     Cancel: POC Hemoccult Bld/Stl (3-Cd Home Screen); Future -     Ambulatory referral to Gastroenterology -     POCT occult blood stool  Melena -     Ambulatory referral to Gastroenterology -     omeprazole (PRILOSEC) 20 MG capsule; Take 1 capsule (20 mg total) by mouth 2 (two) times daily.  Dyspepsia -     omeprazole (PRILOSEC) 20 MG capsule; Take 1 capsule (20 mg total) by mouth 2 (two) times daily.  Type 2 diabetes mellitus with chronic kidney disease, without long-term current use of insulin, unspecified CKD stage ([name]) -     linagliptin (TRADJENTA) 5 MG TABS tablet; Take 1 tablet (5 mg total) by mouth daily.  Hypertension associated with diabetes ([place]) -     metoprolol tartrate (LOPRESSOR) 25 MG tablet; Take 1 tablet (25 mg total) by mouth 2 (two) times daily.  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 No nephrotoxins and no medications that require renal dosing apart from Allopurinol Cont Avapro Follow-up with Nephro  Patient education and anticipatory guidance given Patient agrees with treatment plan Follow-up based on lab results or sooner as needed if symptoms worsen or fail to  improve  Darlyne Russian PA-C

## 2018-04-28 ENCOUNTER — Other Ambulatory Visit: Payer: Self-pay

## 2018-04-28 ENCOUNTER — Ambulatory Visit (INDEPENDENT_AMBULATORY_CARE_PROVIDER_SITE_OTHER): Payer: Medicare Other | Admitting: Sports Medicine

## 2018-04-28 DIAGNOSIS — D509 Iron deficiency anemia, unspecified: Secondary | ICD-10-CM | POA: Diagnosis not present

## 2018-04-28 DIAGNOSIS — N183 Chronic kidney disease, stage 3 (moderate): Secondary | ICD-10-CM | POA: Diagnosis not present

## 2018-04-28 DIAGNOSIS — M170 Bilateral primary osteoarthritis of knee: Secondary | ICD-10-CM

## 2018-04-28 NOTE — Progress Notes (Signed)
   Procedure: Real-time Ultrasound Guided injection of the left knee Device: GE Logiq E  Verbal informed consent obtained.  Time-out conducted.  Noted no overlying erythema, induration, or other signs of local infection.  Skin prepped in a sterile fashion.  Local anesthesia: Topical Ethyl chloride.  With sterile technique and under real time ultrasound guidance:  30 mg/2 mL of OrthoVisc (sodium hyaluronate) in a prefilled syringe was injected easily into the knee through a 22-gauge needle. Completed without difficulty  Pain immediately resolved suggesting accurate placement of the medication.  Advised to call if fevers/chills, erythema, induration, drainage, or persistent bleeding.  Images permanently stored and available for review in the ultrasound unit.  Impression: Technically successful ultrasound guided injection. 

## 2018-04-28 NOTE — Assessment & Plan Note (Signed)
Orthovisc injection #2 of 4 into the left knee. Return in 1 week for #3 of 4.

## 2018-04-30 LAB — CBC
HCT: 33.9 % — ABNORMAL LOW (ref 38.5–50.0)
HEMOGLOBIN: 10.7 g/dL — AB (ref 13.2–17.1)
MCH: 24.7 pg — ABNORMAL LOW (ref 27.0–33.0)
MCHC: 31.6 g/dL — ABNORMAL LOW (ref 32.0–36.0)
MCV: 78.1 fL — ABNORMAL LOW (ref 80.0–100.0)
Platelets: 192 10*3/uL (ref 140–400)
RBC: 4.34 10*6/uL (ref 4.20–5.80)
RDW: 15.8 % — ABNORMAL HIGH (ref 11.0–15.0)
WBC: 9.2 10*3/uL (ref 3.8–10.8)

## 2018-04-30 LAB — HEMOGLOBINOPATHY EVALUATION
Fetal Hemoglobin Testing: <1 % (ref 0.0–1.9)
HEMATOCRIT: 35.6 % — AB (ref 38.5–50.0)
Hemoglobin A2 - HGBRFX: 2.4 % (ref 1.8–3.5)
Hemoglobin: 10.8 g/dL — ABNORMAL LOW (ref 13.2–17.1)
Hgb A: 96.6 % (ref >96.0)
MCH: 24 pg — ABNORMAL LOW (ref 27.0–33.0)
MCV: 79.1 fL — ABNORMAL LOW (ref 80.0–100.0)
RBC: 4.5 10*6/uL (ref 4.20–5.80)
RDW: 16.3 % — ABNORMAL HIGH (ref 11.0–15.0)

## 2018-04-30 LAB — RETICULOCYTES
ABS Retic: 43400 cells/uL (ref 25000–9000)
Retic Ct Pct: 1 %

## 2018-05-05 ENCOUNTER — Other Ambulatory Visit: Payer: Self-pay

## 2018-05-05 ENCOUNTER — Ambulatory Visit (INDEPENDENT_AMBULATORY_CARE_PROVIDER_SITE_OTHER): Payer: Medicare Other | Admitting: Sports Medicine

## 2018-05-05 DIAGNOSIS — M170 Bilateral primary osteoarthritis of knee: Secondary | ICD-10-CM

## 2018-05-05 NOTE — Progress Notes (Signed)
    Procedure: Real-time Ultrasound Guided injection of the left knee Device: GE Logiq E  Verbal informed consent obtained.  Time-out conducted.  Noted no overlying erythema, induration, or other signs of local infection.  Skin prepped in a sterile fashion.  Local anesthesia: Topical Ethyl chloride.  With sterile technique and under real time ultrasound guidance:  30 mg/2 mL of OrthoVisc (sodium hyaluronate) in a prefilled syringe was injected easily into the knee through a 22-gauge needle. Completed without difficulty  Pain immediately resolved suggesting accurate placement of the medication.  Advised to call if fevers/chills, erythema, induration, drainage, or persistent bleeding.  Images permanently stored and available for review in the ultrasound unit.  Impression: Technically successful ultrasound guided injection. 

## 2018-05-05 NOTE — Assessment & Plan Note (Signed)
Orthovisc No. 3 of 4 into the left knee, return in 1 week for #4 of 4. 

## 2018-05-12 ENCOUNTER — Ambulatory Visit (INDEPENDENT_AMBULATORY_CARE_PROVIDER_SITE_OTHER): Payer: Medicare Other | Admitting: Sports Medicine

## 2018-05-12 DIAGNOSIS — M170 Bilateral primary osteoarthritis of knee: Secondary | ICD-10-CM

## 2018-05-12 NOTE — Assessment & Plan Note (Addendum)
Orthovisc No. 4 of 4 into the left knee. We started Orthovisc in the right knee as well, 1 of 4. He needs to return in a week for Orthovisc No. 2 of 4 into the right knee.

## 2018-05-12 NOTE — Progress Notes (Signed)
   Procedure: Real-time Ultrasound Guided injection of the left knee Device: GE Logiq E  Verbal informed consent obtained.  Time-out conducted.  Noted no overlying erythema, induration, or other signs of local infection.  Skin prepped in a sterile fashion.  Local anesthesia: Topical Ethyl chloride.  With sterile technique and under real time ultrasound guidance:  30 mg/2 mL of OrthoVisc (sodium hyaluronate) in a prefilled syringe was injected easily into the knee through a 22-gauge needle. Completed without difficulty  Pain immediately resolved suggesting accurate placement of the medication.  Advised to call if fevers/chills, erythema, induration, drainage, or persistent bleeding.  Images permanently stored and available for review in the ultrasound unit.  Impression: Technically successful ultrasound guided injection.  Procedure: Real-time Ultrasound Guided injection of the right knee Device: GE Logiq E  Verbal informed consent obtained.  Time-out conducted.  Noted no overlying erythema, induration, or other signs of local infection.  Skin prepped in a sterile fashion.  Local anesthesia: Topical Ethyl chloride.  With sterile technique and under real time ultrasound guidance:  30 mg/2 mL of OrthoVisc (sodium hyaluronate) in a prefilled syringe was injected easily into the knee through a 22-gauge needle. Completed without difficulty  Pain immediately resolved suggesting accurate placement of the medication.  Advised to call if fevers/chills, erythema, induration, drainage, or persistent bleeding.  Images permanently stored and available for review in the ultrasound unit.  Impression: Technically successful ultrasound guided injection.

## 2018-05-14 DIAGNOSIS — N183 Chronic kidney disease, stage 3 (moderate): Secondary | ICD-10-CM | POA: Diagnosis not present

## 2018-05-14 DIAGNOSIS — D649 Anemia, unspecified: Secondary | ICD-10-CM | POA: Diagnosis not present

## 2018-05-14 DIAGNOSIS — N3281 Overactive bladder: Secondary | ICD-10-CM | POA: Diagnosis not present

## 2018-05-14 DIAGNOSIS — I129 Hypertensive chronic kidney disease with stage 1 through stage 4 chronic kidney disease, or unspecified chronic kidney disease: Secondary | ICD-10-CM | POA: Diagnosis not present

## 2018-05-14 DIAGNOSIS — M109 Gout, unspecified: Secondary | ICD-10-CM | POA: Diagnosis not present

## 2018-05-14 DIAGNOSIS — E1122 Type 2 diabetes mellitus with diabetic chronic kidney disease: Secondary | ICD-10-CM | POA: Diagnosis not present

## 2018-05-19 ENCOUNTER — Ambulatory Visit (INDEPENDENT_AMBULATORY_CARE_PROVIDER_SITE_OTHER): Payer: Medicare Other | Admitting: Sports Medicine

## 2018-05-19 ENCOUNTER — Other Ambulatory Visit: Payer: Self-pay

## 2018-05-19 DIAGNOSIS — M170 Bilateral primary osteoarthritis of knee: Secondary | ICD-10-CM | POA: Diagnosis not present

## 2018-05-19 NOTE — Assessment & Plan Note (Signed)
Left knee is doing well post Orthovisc, only has a bit of discomfort in the medial joint line. We did the right knee Orthovisc injection #2 of 4 today. Return in 1 week for #3 of 4.

## 2018-05-19 NOTE — Progress Notes (Signed)
   Procedure: Real-time Ultrasound Guided injection of the right knee Device: GE Logiq E  Verbal informed consent obtained.  Time-out conducted.  Noted no overlying erythema, induration, or other signs of local infection.  Skin prepped in a sterile fashion.  Local anesthesia: Topical Ethyl chloride.  With sterile technique and under real time ultrasound guidance:  30 mg/2 mL of OrthoVisc (sodium hyaluronate) in a prefilled syringe was injected easily into the knee through a 22-gauge needle. Completed without difficulty  Pain immediately resolved suggesting accurate placement of the medication.  Advised to call if fevers/chills, erythema, induration, drainage, or persistent bleeding.  Images permanently stored and available for review in the ultrasound unit.  Impression: Technically successful ultrasound guided injection.

## 2018-05-25 ENCOUNTER — Other Ambulatory Visit: Payer: Self-pay | Admitting: Internal Medicine

## 2018-05-25 DIAGNOSIS — N183 Chronic kidney disease, stage 3 unspecified: Secondary | ICD-10-CM

## 2018-05-26 ENCOUNTER — Ambulatory Visit (INDEPENDENT_AMBULATORY_CARE_PROVIDER_SITE_OTHER): Payer: Medicare Other

## 2018-05-26 ENCOUNTER — Other Ambulatory Visit: Payer: Self-pay

## 2018-05-26 ENCOUNTER — Ambulatory Visit (INDEPENDENT_AMBULATORY_CARE_PROVIDER_SITE_OTHER): Payer: Medicare Other | Admitting: Sports Medicine

## 2018-05-26 DIAGNOSIS — M170 Bilateral primary osteoarthritis of knee: Secondary | ICD-10-CM

## 2018-05-26 DIAGNOSIS — N183 Chronic kidney disease, stage 3 unspecified: Secondary | ICD-10-CM

## 2018-05-26 DIAGNOSIS — N189 Chronic kidney disease, unspecified: Secondary | ICD-10-CM | POA: Diagnosis not present

## 2018-05-26 DIAGNOSIS — Z87448 Personal history of other diseases of urinary system: Secondary | ICD-10-CM | POA: Diagnosis not present

## 2018-05-26 NOTE — Progress Notes (Signed)
   Procedure: Real-time Ultrasound Guided injection of the right knee Device: GE Logiq E  Verbal informed consent obtained.  Time-out conducted.  Noted no overlying erythema, induration, or other signs of local infection.  Skin prepped in a sterile fashion.  Local anesthesia: Topical Ethyl chloride.  With sterile technique and under real time ultrasound guidance:  30 mg/2 mL of OrthoVisc (sodium hyaluronate) in a prefilled syringe was injected easily into the knee through a 22-gauge needle. Completed without difficulty  Pain immediately resolved suggesting accurate placement of the medication.  Advised to call if fevers/chills, erythema, induration, drainage, or persistent bleeding.  Images permanently stored and available for review in the ultrasound unit.  Impression: Technically successful ultrasound guided injection.

## 2018-05-26 NOTE — Assessment & Plan Note (Signed)
Orthovisc injection #3 of 4 into the right knee. Return in 1 week for #4 of 4

## 2018-06-02 ENCOUNTER — Ambulatory Visit (INDEPENDENT_AMBULATORY_CARE_PROVIDER_SITE_OTHER): Payer: Medicare Other | Admitting: Sports Medicine

## 2018-06-02 DIAGNOSIS — M170 Bilateral primary osteoarthritis of knee: Secondary | ICD-10-CM

## 2018-06-02 DIAGNOSIS — M48062 Spinal stenosis, lumbar region with neurogenic claudication: Secondary | ICD-10-CM

## 2018-06-02 MED ORDER — HYDROCODONE-ACETAMINOPHEN 5-325 MG PO TABS: 1.0000 | ORAL_TABLET | Freq: Three times a day (TID) | ORAL | 0 refills | Status: DC | PRN

## 2018-06-02 NOTE — Assessment & Plan Note (Signed)
Orthovisc No. 4 of 4 into the right knee, both knees are doing well now post Orthovisc.

## 2018-06-02 NOTE — Assessment & Plan Note (Signed)
Currently under the care of Dr. Lynann Bologna, the plan was epidurals with Dr. Mina Marble but this has not yet been done, plan is L2 to pelvic fusion, essentially extending his L4-L5 fusion. I am going to go ahead and set him up with L2-L3 interlaminar epidural with Dr. Francesco Runner, he has severe stenosis at L2-L4. It sounds as though Dr. Mina Marble is not able to do epidurals just yet.

## 2018-06-02 NOTE — Progress Notes (Signed)
Subjective:    CC: Several issues  HPI: Right knee osteoarthritis: Doing well post Orthovisc series in the left knee, he is here for Orthovisc No. 4 of 4 into his right knee, doing well.  Back pain: Post L4-L5 posterior fusion, severe spinal stenosis from L2-L4 as well as L5-S1.  Epidurals have helped in the past.  Pain is in the back, radiating down the back of the right leg, moderate, persistent, no bowel or bladder dysfunction, saddle numbness, constitutional symptoms.  Worsening.  I reviewed the past medical history, family history, social history, surgical history, and allergies today and no changes were needed.  Please see the problem list section below in epic for further details.  Past Medical History: Past Medical History:  Diagnosis Date  . Anxiety   . Arthritis   . Cancer ([place])   . 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 Diabetes mellitus without complication ([place])   . Gout   . Hyperlipidemia   . Hypertension   . 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 Overactive bladder   . Sleep apnea   . Stroke [facility])    Past Surgical History: Past Surgical History:  Procedure Laterality Date  . CHOLECYSTECTOMY    . COLOSTOMY    . PROSTATE SURGERY     Social History: Social History   Socioeconomic History  . Marital status: Married    Spouse name: Not on file  . Number of children: Not on file  . Years of education: Not on file  . Highest education level: Not on file  Occupational History  . Not on file  Social Needs  . Financial resource strain: Not on file  . Food insecurity:    Worry: Not on file    Inability: Not on file  . Transportation needs:    Medical: Not on file    Non-medical: Not on file  Tobacco Use  . Smoking status: Former Research scientist (life sciences)  . Smokeless tobacco: Never Used  Substance and Sexual Activity  . Alcohol use: Not Currently  . Drug use: Never  . Sexual activity: Not Currently  Lifestyle  . Physical activity:    Days per week: Not on file    Minutes per session: Not on file  . Stress: Not on file  Relationships  . Social connections:    Talks on phone: Not on file    Gets together: Not on file    Attends religious service: Not on file    Active member of club or organization: Not on file    Attends meetings of clubs or organizations: Not on file    Relationship status: Not on file  Other Topics Concern  . Not on file  Social History Narrative  . Not on file   Family History: Family History  Family history unknown: Yes   Allergies: Allergies  Allergen Reactions  . Aspirin Swelling  . Shellfish Allergy   . Doxepin Other (See Comments)    Dry mouth  . Sertraline Diarrhea   Medications: See med rec.  Review of Systems: No fevers, chills, night sweats, weight loss, chest pain, or shortness of breath.   Objective:    General: Well Developed, well nourished, and in no acute distress.  Neuro: Alert and oriented x3, extra-ocular muscles intact, sensation grossly intact.  HEENT: Normocephalic, atraumatic, pupils equal round reactive to light, neck supple, no masses, no lymphadenopathy, thyroid nonpalpable.  Skin: Warm and dry, no rashes. Cardiac: Regular rate and rhythm, no murmurs rubs or gallops, no lower extremity edema.  Respiratory: Clear to auscultation bilaterally. Not using accessory muscles, speaking in full sentences.  Procedure: Real-time Ultrasound Guided injection of the right knee Device: GE Logiq E  Verbal informed consent obtained.  Time-out conducted.  Noted no overlying erythema, induration, or other signs of local infection.  Skin prepped in a sterile fashion.  Local anesthesia: Topical Ethyl chloride.  With sterile technique and under real time ultrasound guidance:  30 mg/2 mL of OrthoVisc (sodium hyaluronate) in a prefilled syringe was injected easily into the knee through a 22-gauge needle. Completed without difficulty  Pain immediately resolved suggesting accurate placement of the  medication.  Advised to call if fevers/chills, erythema, induration, drainage, or persistent bleeding.  Images permanently stored and available for review in the ultrasound unit.  Impression: Technically successful ultrasound guided injection.  Impression and Recommendations:    Primary osteoarthritis of both knees Orthovisc No. 4 of 4 into the right knee, both knees are doing well now post Orthovisc.  Lumbar spinal stenosis Currently under the care of Dr. Lynann Bologna, the plan was epidurals with Dr. Mina Marble but this has not yet been done, plan is L2 to pelvic fusion, essentially extending his L4-L5 fusion. I am going to go ahead and set him up with L2-L3 interlaminar epidural with Dr. Francesco Runner, he has severe stenosis at L2-L4. It sounds as though Dr. Mina Marble is not able to do epidurals just yet.   ___________________________________________ Gwen Her. Dianah Field, M.D., ABFM., CAQSM. Primary Care and Sports Medicine [facility]  Adjunct Professor of [employer]

## 2018-06-03 ENCOUNTER — Other Ambulatory Visit: Payer: Self-pay | Admitting: Physician Assistant

## 2018-06-15 ENCOUNTER — Other Ambulatory Visit: Payer: Self-pay

## 2018-06-15 DIAGNOSIS — R1013 Epigastric pain: Secondary | ICD-10-CM

## 2018-06-15 DIAGNOSIS — E1122 Type 2 diabetes mellitus with diabetic chronic kidney disease: Secondary | ICD-10-CM

## 2018-06-15 DIAGNOSIS — K921 Melena: Secondary | ICD-10-CM

## 2018-06-15 MED ORDER — LINAGLIPTIN 5 MG PO TABS: 5.0000 mg | ORAL_TABLET | Freq: Every day | ORAL | 0 refills | Status: DC

## 2018-06-15 MED ORDER — OMEPRAZOLE 20 MG PO CPDR: 20.0000 mg | DELAYED_RELEASE_CAPSULE | Freq: Two times a day (BID) | ORAL | 0 refills | Status: DC

## 2018-06-15 MED ORDER — POTASSIUM CHLORIDE ER 10 MEQ PO CPCR: 10.0000 meq | ORAL_CAPSULE | Freq: Every day | ORAL | 0 refills | Status: DC

## 2018-06-25 ENCOUNTER — Other Ambulatory Visit: Payer: Self-pay | Admitting: Physician Assistant

## 2018-06-25 DIAGNOSIS — E1159 Type 2 diabetes mellitus with other circulatory complications: Secondary | ICD-10-CM

## 2018-06-25 DIAGNOSIS — I152 Hypertension secondary to endocrine disorders: Secondary | ICD-10-CM

## 2018-06-28 ENCOUNTER — Ambulatory Visit (INDEPENDENT_AMBULATORY_CARE_PROVIDER_SITE_OTHER): Payer: Medicare Other | Admitting: Sports Medicine

## 2018-06-28 ENCOUNTER — Encounter: Payer: Self-pay | Admitting: Sports Medicine

## 2018-06-28 DIAGNOSIS — M48062 Spinal stenosis, lumbar region with neurogenic claudication: Secondary | ICD-10-CM

## 2018-06-28 DIAGNOSIS — M170 Bilateral primary osteoarthritis of knee: Secondary | ICD-10-CM

## 2018-06-28 MED ORDER — HYDROCODONE-ACETAMINOPHEN 5-325 MG PO TABS: 1.0000 | ORAL_TABLET | Freq: Three times a day (TID) | ORAL | 0 refills | Status: DC | PRN

## 2018-06-28 NOTE — Assessment & Plan Note (Signed)
Completely pain-free after Orthovisc in both knees, return as needed for this.

## 2018-06-28 NOTE — Progress Notes (Signed)
Subjective:    CC: Follow-up  HPI: Knee osteoarthritis: Resolved after Orthovisc series.  Back pain: Better with pain medication, hopefully we can hold off on intervention for now.  I reviewed the past medical history, family history, social history, surgical history, and allergies today and no changes were needed.  Please see the problem list section below in epic for further details.  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   Overactive bladder    Sleep apnea    Stroke [facility])    Past Surgical History: Past Surgical History:  Procedure Laterality Date   CHOLECYSTECTOMY     COLOSTOMY     PROSTATE SURGERY     Social History: Social History   Socioeconomic History   Marital status: Married    Spouse name: Not on file   Number of children: Not on file   Years of education: Not on file   Highest education level: Not on file  Occupational History   Not on file  Social Needs   Financial resource strain: Not on file   Food insecurity:    Worry: Not on file    Inability: Not on file   Transportation needs:    Medical: Not on file    Non-medical: Not on file  Tobacco Use   Smoking status: Former Smoker   Smokeless tobacco: Never Used  Substance and Sexual Activity   Alcohol use: Not Currently   Drug use: Never   Sexual activity: Not Currently  Lifestyle   Physical activity:    Days per week: Not on file    Minutes per session: Not on file   Stress: Not on file  Relationships   Social connections:    Talks on phone: Not on file    Gets together: Not on file    Attends religious service: Not on file    Active member of club or organization: Not on file    Attends meetings of clubs or organizations:  Not on file    Relationship status: Not on file  Other Topics Concern   Not on file  Social History Narrative   Not on file   Family History: Family History  Family history unknown: Yes   Allergies: Allergies  Allergen Reactions   Aspirin Swelling   Shellfish Allergy    Doxepin Other (See Comments)    Dry mouth   Sertraline Diarrhea   Medications: See med rec.  Review of Systems: No fevers, chills, night sweats, weight loss, chest pain, or shortness of breath.   Objective:    General: Well Developed, well nourished, and in no acute distress.  Neuro: Alert and oriented x3, extra-ocular muscles intact, sensation grossly intact.  HEENT: Normocephalic, atraumatic, pupils equal round reactive to light, neck supple, no masses, no lymphadenopathy, thyroid nonpalpable.  Skin: Warm and dry, no rashes. Cardiac: Regular rate and rhythm, no murmurs rubs or gallops, no lower extremity edema.  Respiratory: Clear to auscultation bilaterally. Not using accessory muscles, speaking in full sentences.  Impression and Recommendations:    Primary osteoarthritis of both knees Completely pain-free after Orthovisc in both knees, return as needed for this.  Lumbar spinal stenosis Currently under the care of Dr. Lynann Bologna, the plan was epidurals, and if  this fails then an L2 to pelvic fusion. This would be essentially extending his L4-L5 fusion. He did get contacted by Dr. Francesco Runner for an L2-L3 interlaminar epidural but he was not having pain, he has severe stenosis from L2-L4. He is doing okay now, he just needs a refill on the pain medication, he feels as though he can avoid an injection if he uses the medication intermittently.   ___________________________________________ Gwen Her. Dianah Field, M.D., ABFM., CAQSM. Primary Care and Sports Medicine [facility]  Adjunct Professor of [employer]

## 2018-06-28 NOTE — Assessment & Plan Note (Signed)
Currently under the care of Dr. Lynann Bologna, the plan was epidurals, and if this fails then an L2 to pelvic fusion. This would be essentially extending his L4-L5 fusion. He did get contacted by Dr. Francesco Runner for an L2-L3 interlaminar epidural but he was not having pain, he has severe stenosis from L2-L4. He is doing okay now, he just needs a refill on the pain medication, he feels as though he can avoid an injection if he uses the medication intermittently.

## 2018-06-28 NOTE — Patient Instructions (Signed)
Dr. Rayvon Char. Francesco Runner, [facility] [address], [place], [place] [address] Phone: [phone]

## 2018-06-29 ENCOUNTER — Ambulatory Visit: Payer: Medicare Other | Admitting: Sports Medicine

## 2018-06-29 ENCOUNTER — Other Ambulatory Visit: Payer: Self-pay | Admitting: Physician Assistant

## 2018-06-29 DIAGNOSIS — Z8739 Personal history of other diseases of the musculoskeletal system and connective tissue: Secondary | ICD-10-CM

## 2018-07-12 DIAGNOSIS — E1142 Type 2 diabetes mellitus with diabetic polyneuropathy: Secondary | ICD-10-CM | POA: Diagnosis not present

## 2018-07-12 DIAGNOSIS — B351 Tinea unguium: Secondary | ICD-10-CM | POA: Diagnosis not present

## 2018-07-21 ENCOUNTER — Ambulatory Visit: Payer: Medicare Other | Admitting: Physician Assistant

## 2018-07-27 ENCOUNTER — Encounter: Payer: Self-pay | Admitting: Physician Assistant

## 2018-07-27 ENCOUNTER — Ambulatory Visit (INDEPENDENT_AMBULATORY_CARE_PROVIDER_SITE_OTHER): Payer: Medicare Other | Admitting: Physician Assistant

## 2018-07-27 VITALS — BP 126/66 | HR 72 | Temp 98.1°F | Wt 210.0 lb

## 2018-07-27 DIAGNOSIS — N183 Chronic kidney disease, stage 3 unspecified: Secondary | ICD-10-CM

## 2018-07-27 DIAGNOSIS — Z8673 Personal history of transient ischemic attack (TIA), and cerebral infarction without residual deficits: Secondary | ICD-10-CM

## 2018-07-27 DIAGNOSIS — I493 Ventricular premature depolarization: Secondary | ICD-10-CM

## 2018-07-27 DIAGNOSIS — Z8719 Personal history of other diseases of the digestive system: Secondary | ICD-10-CM | POA: Diagnosis not present

## 2018-07-27 DIAGNOSIS — E1159 Type 2 diabetes mellitus with other circulatory complications: Secondary | ICD-10-CM

## 2018-07-27 DIAGNOSIS — I10 Essential (primary) hypertension: Secondary | ICD-10-CM

## 2018-07-27 DIAGNOSIS — R1013 Epigastric pain: Secondary | ICD-10-CM

## 2018-07-27 DIAGNOSIS — I499 Cardiac arrhythmia, unspecified: Secondary | ICD-10-CM

## 2018-07-27 DIAGNOSIS — E1122 Type 2 diabetes mellitus with diabetic chronic kidney disease: Secondary | ICD-10-CM

## 2018-07-27 DIAGNOSIS — I152 Hypertension secondary to endocrine disorders: Secondary | ICD-10-CM

## 2018-07-27 LAB — POCT GLYCOSYLATED HEMOGLOBIN (HGB A1C): HbA1c, POC (prediabetic range): 6 % (ref 5.7–6.4)

## 2018-07-27 MED ORDER — IRBESARTAN 150 MG PO TABS: 150.0000 mg | ORAL_TABLET | Freq: Every day | ORAL | 1 refills | Status: DC

## 2018-07-27 MED ORDER — CLOPIDOGREL BISULFATE 75 MG PO TABS: 75.0000 mg | ORAL_TABLET | Freq: Every day | ORAL | 2 refills | Status: DC

## 2018-07-27 MED ORDER — OMEPRAZOLE 20 MG PO CPDR: 20.0000 mg | DELAYED_RELEASE_CAPSULE | Freq: Every day | ORAL | 0 refills | Status: DC

## 2018-07-27 NOTE — Patient Instructions (Addendum)
MEDICATION CHANGES: Stop Glimeperide (green pill) Reduce your dose of Irbesartan in half from 300 mg to 150 mg (you can take 1/2 tablet of your current pill until you run out and then start the new prescription for the 150 mg tablet OR you can throw away the 300 mg tablets and just start the new prescription) Re-start your Plavix (Clopidogrel ) daily Reduce your Omeprazole dose back to 20 mg ONCE a day  FOR YOUR DIABETES Correct low blood sugar using 15/15 rule - eat or drink 15g of sugar, wait 15 minutes to recheck, if blood sugar is still <70 have another serving Repeat process until glucose>70 If glucose is not coming up, seek medical attention    Food Basics for Chronic Kidney Disease When your kidneys are not working well, they cannot remove waste and excess substances from your blood as effectively as they did before. This can lead to a buildup and imbalance of these substances, which can worsen kidney damage and affect how your body functions. Certain foods lead to a buildup of these substances in the body. By changing your diet as recommended by your diet and nutrition specialist (dietitian) or health care provider, you could help prevent further kidney damage and delay or prevent the need for dialysis. What are tips for following this plan? General instructions   Work with your health care provider and dietitian to develop a meal plan that is right for you. Foods you can eat, limit, or avoid will be different for each person depending on the stage of kidney disease and any other existing health conditions.  Talk with your health care provider about whether you should take a vitamin and mineral supplement.  Use standard measuring cups and spoons to measure servings of foods. Use a kitchen scale to measure portions of protein foods.  If directed by your health care provider, avoid drinking too much fluid. Measure and count all liquids, including water, ice, soups, flavored gelatin,  and frozen desserts such as popsicles or ice cream. Reading food labels  Check the amount of sodium in foods. Choose foods that have less than 300 milligrams (mg) per serving.  Check the ingredient list for phosphorus or potassium-based additives or preservatives.  Check the amount of saturated and trans fat. Limit or avoid these fats as told by your dietitian. Shopping  Avoid buying foods that are: ? Processed, frozen, or prepackaged. ? Calcium-enriched or fortified.  Do not buy foods that have salt or sodium listed among the first five ingredients.  Do not buy canned vegetables. Cooking  Replace animal proteins, such as meat, fish, eggs, or dairy, with plant proteins from beans, nuts, and soy. ? Use soy milk instead of cow's milk. ? Add beans or tofu to soups, casseroles, or pasta dishes instead of meat.  Soak vegetables, such as potatoes, before cooking to reduce potassium. To do this: ? Peel and cut into small pieces. ? Soak in warm water for at least 2 hours. For every 1 cup of vegetables, use 10 cups of water. ? Drain and rinse with warm water. ? Boil for at least 5 minutes. Meal planning  Limit the amount of protein from plant and animal sources you eat each day.  Do not add salt to food when cooking or before eating.  Eat meals and snacks at around the same time each day. If you have diabetes:  If you have diabetes (diabetes mellitus) and chronic kidney disease, it is important to keep your blood glucose in  the target range recommended by your health care provider. Follow your diabetes management plan. This may include: ? Checking your blood glucose regularly. ? Taking oral medicines, insulin, or both. ? Exercising for at least 30 minutes on 5 or more days each week, or as told by your health care provider. ? Tracking how many servings of carbohydrates you eat at each meal.  You may be given specific guidelines on how much of certain foods and nutrients you may eat,  depending on your stage of kidney disease and whether you have high blood pressure (hypertension). Follow your meal plan as told by your dietitian. What nutrients should be limited? The items listed are not a complete list. Talk with your dietitian about what dietary choices are best for you. Potassium Potassium affects how steadily your heart beats. If too much potassium builds up in your blood, it can cause an irregular heartbeat or even a heart attack. You may need to eat less potassium, depending on your blood potassium levels and the stage of kidney disease. Talk to your dietitian about how much potassium you may have each day. You may need to limit or avoid foods that are high in potassium, such as:  Milk and soy milk.  Fruits, such as bananas, papaya, apricots, nectarines, melon, prunes, raisins, kiwi, and oranges.  Vegetables, such as potatoes, sweet potatoes, yams, tomatoes, leafy greens, beets, okra, avocado, pumpkin, and winter squash.  White and lima beans. Phosphorus Phosphorus is a mineral found in your bones. A balance between calcium and phosphorous is needed to build and maintain healthy bones. Too much phosphorus pulls calcium from your bones. This can make your bones weak and more likely to break. Too much phosphorus can also make your skin itch. You may need to eat less phosphorus depending on your blood phosphorus levels and the stage of kidney disease. Talk to your dietitian about how much potassium you may have each day. You may need to take medicine to lower your blood phosphorus levels if diet changes do not help. You may need to limit or avoid foods that are high in phosphorus, such as:  Milk and dairy products.  Dried beans and peas.  Tofu, soy milk, and other soy-based meat replacements.  Colas.  Nuts and peanut butter.  Meat, poultry, and fish.  Bran cereals and oatmeals. Protein Protein helps you to make and keep muscle. It also helps in the repair of  your body's cells and tissues. One of the natural breakdown products of protein is a waste product called urea. When your kidneys are not working properly, they cannot remove wastes, such as urea, like they did before you developed chronic kidney disease. Reducing how much protein you eat can help prevent a buildup of urea in your blood. Depending on your stage of kidney disease, you may need to limit foods that are high in protein. Sources of animal protein include:  Meat (all types).  Fish and seafood.  Poultry.  Eggs.  Dairy. Other protein foods include:  Beans and legumes.  Nuts and nut butter.  Soy and tofu. Sodium Sodium, which is found in salt, helps maintain a healthy balance of fluids in your body. Too much sodium can increase your blood pressure and have a negative effect on the function of your heart and lungs. Too much sodium can also cause your body to retain too much fluid, making your kidneys work harder. Most people should have less than 2,300 milligrams (mg) of sodium each day. If  you have hypertension, you may need to limit your sodium to 1,500 mg each day. Talk to your dietitian about how much sodium you may have each day. You may need to limit or avoid foods that are high in sodium, such as:  Salt seasonings.  Soy sauce.  Cured and processed meats.  Salted crackers and snack foods.  Fast food.  Canned soups and most canned foods.  Pickled foods.  Vegetable juice.  Boxed mixes or ready-to-eat boxed meals and side dishes.  Bottled dressings, sauces, and marinades. Summary  Chronic kidney disease can lead to a buildup and imbalance of waste and excess substances in the body. Certain foods lead to a buildup of these substances. By adjusting your intake of these foods, you could help prevent more kidney damage and delay or prevent the need for dialysis.  Food adjustments are different for each person with chronic kidney disease. Work with a dietitian to  set up nutrient goals and a meal plan that is right for you.  If you have diabetes and chronic kidney disease, it is important to keep your blood glucose in the target range recommended by your health care provider. This information is not intended to replace advice given to you by your health care provider. Make sure you discuss any questions you have with your health care provider. Document Released: 04/19/2002 Document Revised: 01/23/2016 Document Reviewed: 01/23/2016 Elsevier Interactive Patient Education  2019 [employer].

## 2018-07-27 NOTE — Progress Notes (Signed)
HPI:                                                                Damon Acosta is a 78 y.o. male who presents to [facility] today for follow-up anemia  At last OV, noted a 1.0 drop in hemoglobin (11.4--> 10.4) over a 4 month period. MCV was decreased at 78.7, normal B12, folate, and iron indices. Normal WBC and platelets. in reviewing his records from prior PCP Hgb was 11.1, MCV 78, ferritin 205 and iron 48 (10/20/2017) Patient endorsed several episodes of dark tarry stool. Plavix was held. He completed FOBT x 3 at home, which were negative.  He followed up with GI on 04/28/18. Per GI, they did not feel anemia was due to GI bleed or that EGD would be high yield. They instructed patient to continue Prilosec and f/u if he has an overt GI bleeding/hematochezia.  Reports he has felt "a little more winded" and fatigued than usual. Endorses recurrent melena, intermittent, last episode was 2 weeks ago. Denies hx of anemia but   He was also found to have worsening renal function, GFR 39, and referred to Nephrology. Baseline Scr 1.27-1.42, GFR 69-70 12/15/17 Scr 1.50 03/17/18 Scr 1.45 04/21/18 Scr 1.72, GFR 43  He was evaluated by nephrology on 05/14/2018 and dianosed with CKD stage 3 felt to be due to age-related renal decline and arterionephrosclerosis. Recommended home BP monitoring and possibly reducing antihypertensive medication. Recommended avoiding NSAIDs and maintaining euvolemia  He states morning fasting glucose has been on the low side this week (70's-80's). He has not had symptoms or self-corrected.  He endorses dyspnea on mild exertion. Denies chest pain, palpitations, near-syncope.  Past Medical History:  Diagnosis Date  . Anxiety   . Arthritis   . Cancer ([place])   . 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 Diabetes mellitus without complication ([place])   . Gout   . Hyperlipidemia   . Hypertension   . Ogilvie's  syndrome 01/12/2018  . Overactive bladder   . Sleep apnea   . Stroke [facility])    Past Surgical History:  Procedure Laterality Date  . CHOLECYSTECTOMY    . COLOSTOMY    . PROSTATE SURGERY     Social History   Tobacco Use  . Smoking status: Former Research scientist (life sciences)  . Smokeless tobacco: Never Used  Substance Use Topics  . Alcohol use: Not Currently   Family history is unknown by patient.    ROS: negative except as noted in the HPI  Medications: Current Outpatient Medications  Medication Sig Dispense Refill  . acetaminophen (TYLENOL) 650 MG CR tablet Take 1-2 tablets (650-1,300 mg total) by mouth every 8 (eight) hours as needed for pain. 90 tablet 3  . allopurinol (ZYLOPRIM) 300 MG tablet TAKE 1 TABLET BY MOUTH EVERY DAY 90 tablet 1  . HYDROcodone-acetaminophen (NORCO/VICODIN) 5-325 MG tablet Take 1 tablet by mouth every 8 (eight) hours as needed for moderate pain. 90 tablet 0  . irbesartan (AVAPRO) 150 MG tablet Take 1 tablet (150 mg total) by mouth daily. 90 tablet 1  . linagliptin (TRADJENTA) 5 MG TABS tablet Take 1 tablet (5 mg total) by mouth daily. 90 tablet 0  . methocarbamol (ROBAXIN) 500 MG tablet Take  1 tablet (500 mg total) by mouth 2 (two) times daily as needed for muscle spasms. 60 tablet 3  . metoprolol tartrate (LOPRESSOR) 25 MG tablet Take 1 tablet (25 mg total) by mouth 2 (two) times daily. 180 tablet 1  . nitroGLYCERIN (NITROSTAT) 0.6 MG SL tablet Place 0.6 mg under the tongue every 5 (five) minutes as needed for chest pain.    Marland Kitchen omeprazole (PRILOSEC) 20 MG capsule Take 1 capsule (20 mg total) by mouth daily. 180 capsule 0  . oxybutynin (DITROPAN-XL) 10 MG 24 hr tablet Take 10 mg by mouth daily.    . potassium chloride (MICRO-K) 10 MEQ CR capsule Take 1 capsule (10 mEq total) by mouth daily. 90 capsule 0  . traMADol (ULTRAM) 50 MG tablet Take 1-1.5 tablets (50-75 mg total) by mouth every 6 (six) hours as needed for moderate pain or severe pain. Maximum 6 tabs per day. 90 tablet  0  . amLODipine (NORVASC) 2.5 MG tablet Take 1 tablet (2.5 mg total) by mouth daily. 30 tablet 1  . atorvastatin (LIPITOR) 20 MG tablet TAKE 1 TABLET BY MOUTH EVERYDAY AT BEDTIME 90 tablet 0  . clopidogrel (PLAVIX) 75 MG tablet Take 1 tablet (75 mg total) by mouth daily. 90 tablet 2  . mupirocin ointment (BACTROBAN) 2 % Apply 1 application topically 3 (three) times daily. 15 g 0  . oxybutynin (DITROPAN) 5 MG tablet TAKE 1 TABLET BY MOUTH TWICE A DAY 180 tablet 0   No current facility-administered medications for this visit.     Allergies  Allergen Reactions  . Aspirin Swelling  . Shellfish Allergy   . Doxepin Other (See Comments)    Dry mouth  . Sertraline Diarrhea       Objective:  BP 126/66   Pulse 72   Temp 98.1 F (36.7 C) (Oral)   Wt 210 lb (95.3 kg)   BMI 31.01 kg/m  Gen:  alert, not ill-appearing, no distress, appropriate for age HEENT: head normocephalic without obvious abnormality, conjunctiva and cornea clear, trachea midline Pulm: Normal work of breathing, normal phonation CV: rate normal, irregular rhythm, s1 and s2 distinct Neuro: alert and oriented x 3, no tremor MSK: extremities atraumatic, trace peripheral edema, ambulating in upright walker Skin: intact, no rashes on exposed skin, no jaundice, no cyanosis  ECG 07/27/2018 Vent rate 73 bpm PR-I 178 ms QRS 74 ms QT/QTc 392/431 ms Sinus rhythm with occasional premature ventricular complexes  BP Readings from Last 3 Encounters:  08/10/18 (!) 125/40  08/04/18 113/69  07/27/18 126/66   Wt Readings from Last 3 Encounters:  08/10/18 209 lb (94.8 kg)  08/04/18 210 lb (95.3 kg)  07/27/18 210 lb (95.3 kg)    Lab Results  Component Value Date   CREATININE 1.33 (H) 07/27/2018   BUN 23 07/27/2018   NA 144 07/27/2018   K 4.9 07/27/2018   CL 114 (H) 07/27/2018   CO2 23 07/27/2018    Lab Results  Component Value Date   WBC 8.9 07/27/2018   HGB 11.1 (L) 07/27/2018   HCT 35.8 (L) 07/27/2018   MCV 79.9  (L) 07/27/2018   PLT 197 07/27/2018   Lab Results  Component Value Date   IRON 76 04/21/2018   TIBC 201 (L) 04/21/2018   FERRITIN 234 04/21/2018   Lab Results  Component Value Date   CREATININE 1.33 (H) 07/27/2018   BUN 23 07/27/2018   NA 144 07/27/2018   K 4.9 07/27/2018   CL 114 (H) 07/27/2018  CO2 23 07/27/2018   Estimated Creatinine Clearance: 51.3 mL/min (A) (by C-G formula based on SCr of 1.33 mg/dL (H)).   No results found for this or any previous visit (from the past 72 hour(s)). No results found.    Assessment and Plan: 78 y.o. male with   .[place] was seen today for medication management.  Diagnoses and all orders for this visit:  Hypertension associated with diabetes ([place]) -     irbesartan (AVAPRO) 150 MG tablet; Take 1 tablet (150 mg total) by mouth daily.  Type 2 diabetes mellitus with stage 3 chronic kidney disease, without long-term current use of insulin ([name]) -     POCT HgB A1C  CKD (chronic kidney disease) stage 3, GFR 30-59 ml/min ([name]) -     Renal Profile with Estimated GFR -     CBC  Irregular heart rhythm -     EKG 12-Lead  Asymptomatic PVCs  Dyspepsia -     omeprazole (PRILOSEC) 20 MG capsule; Take 1 capsule (20 mg total) by mouth daily.  History of melena -     omeprazole (PRILOSEC) 20 MG capsule; Take 1 capsule (20 mg total) by mouth daily.  History of CVA (cerebrovascular accident) -     clopidogrel (PLAVIX) 75 MG tablet; Take 1 tablet (75 mg total) by mouth daily.    DM2 Lab Results  Component Value Date   HGBA1C 6.0 07/27/2018  well controlled Hypoglycemia in the 70's-80's at home D/c Glimeperide Cont Tradjenta 5 mg Reviewed how to correct for hypoglycemia Follow-up in 2 weeks  Microcytic anemia 2/2 CKD Repeat CBC pending to trend Hgb  HTN, CKD stage 3 On auscultation, rhythm was noted to be irregular. ECG was performed in office and reviewed by myself and supervising physician, Dr. Emeterio Reeve. ECG showed  NSR with occ PVC. Patient is asymptomatic during PVC BP goal <140/90 Reducing Irbesartan from 300 to 150 mg QD  Hx of CVA Resume Plavix 75 mg daily   Patient education and anticipatory guidance given Patient agrees with treatment plan Follow-up in 2 weeks or sooner as needed if symptoms worsen or fail to improve  Darlyne Russian PA-C

## 2018-07-28 LAB — CBC
HCT: 35.8 % — ABNORMAL LOW (ref 38.5–50.0)
Hemoglobin: 11.1 g/dL — ABNORMAL LOW (ref 13.2–17.1)
MCH: 24.8 pg — ABNORMAL LOW (ref 27.0–33.0)
MCHC: 31 g/dL — ABNORMAL LOW (ref 32.0–36.0)
MCV: 79.9 fL — ABNORMAL LOW (ref 80.0–100.0)
Platelets: 197 10*3/uL (ref 140–400)
RBC: 4.48 10*6/uL (ref 4.20–5.80)
RDW: 16.8 % — ABNORMAL HIGH (ref 11.0–15.0)
WBC: 8.9 10*3/uL (ref 3.8–10.8)

## 2018-07-28 LAB — RENAL PROFILE WITH ESTIMATED GFR
Albumin: 4.1 g/dL (ref 3.6–5.1)
BUN/Creatinine Ratio: 17 (calc) (ref 6–22)
BUN: 23 mg/dL (ref 7–25)
CO2: 23 mmol/L (ref 20–32)
Calcium: 9.1 mg/dL (ref 8.6–10.3)
Chloride: 114 mmol/L — ABNORMAL HIGH (ref 98–110)
Creat: 1.33 mg/dL — ABNORMAL HIGH (ref 0.70–1.18)
GFR, Est African American: 59 mL/min/{1.73_m2} — ABNORMAL LOW (ref >=60)
GFR, Est Non African American: 50 mL/min/{1.73_m2} — ABNORMAL LOW (ref >=60)
Glucose, Bld: 75 mg/dL (ref 65–99)
Phosphorus: 3.8 mg/dL (ref 2.1–4.3)
Potassium: 4.9 mmol/L (ref 3.5–5.3)
Sodium: 144 mmol/L (ref 135–146)

## 2018-08-04 ENCOUNTER — Encounter: Payer: Self-pay | Admitting: Emergency Medicine

## 2018-08-04 ENCOUNTER — Other Ambulatory Visit: Payer: Self-pay

## 2018-08-04 ENCOUNTER — Emergency Department (INDEPENDENT_AMBULATORY_CARE_PROVIDER_SITE_OTHER)
Admission: EM | Admit: 2018-08-04 | Discharge: 2018-08-04 | Disposition: A | Discharge status: Home / Self Care | Payer: Medicare Other | Source: Home / Self Care | Attending: Family Medicine | Admitting: Family Medicine

## 2018-08-04 DIAGNOSIS — S60312A Abrasion of left thumb, initial encounter: Secondary | ICD-10-CM | POA: Diagnosis not present

## 2018-08-04 MED ORDER — MUPIROCIN 2 % EX OINT: 1.0000 "application " | TOPICAL_OINTMENT | Freq: Three times a day (TID) | CUTANEOUS | 0 refills | Status: DC

## 2018-08-04 NOTE — Discharge Instructions (Addendum)
Change bandage daily and apply antibiotic ointment until healed.  Keep bandage clean and dry.

## 2018-08-04 NOTE — ED Triage Notes (Signed)
Patient has underlying conditions that make him fall frequently; here with walker today; fell 4 days ago and injured left thumb and right knee. Would like them evaluated. He has not travelled in past 4 weeks.

## 2018-08-04 NOTE — ED Provider Notes (Signed)
[name]    CSN: [id number] Arrival date & time: 08/04/18  1708     History   Chief Complaint Chief Complaint  Patient presents with  . Fall    HPI Damon Acosta is a 78 y.o. male.   Patient states that he fell four days ago, striking his right knee and abrading his left thumb.  He now has no swelling, pain, or skin abrasions on his knee.  His left thumb has a loose flap of skin distally.  Although there is no pain, he is concerned that he may be developing infection in his thumb.     Past Medical History:  Diagnosis Date  . Anxiety   . Arthritis   . Cancer ([place])   . 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 Diabetes mellitus without complication ([place])   . Gout   . Hyperlipidemia   . Hypertension   . 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 Overactive bladder   . Sleep apnea   . Stroke [facility])     Patient Active Problem List   Diagnosis Date Noted  . Asymptomatic PVCs 07/27/2018  . Melena 04/27/2018  . CKD (chronic kidney disease) stage 3, GFR 30-59 ml/min ([name]) 04/27/2018  . Mild cognitive impairment 04/20/2018  . Microcytic anemia 04/20/2018  . Primary osteoarthritis of both knees 02/17/2018  . Insomnia due to anxiety and fear 01/26/2018  . Corn of left foot, plantar fifth MTP 01/26/2018  . 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 Type 2 diabetes mellitus with chronic kidney disease, without long-term current use of insulin ([place]) 01/06/2018  . Hyperkalemia 01/06/2018  . Elevated serum creatinine 01/06/2018  . History of gout 01/06/2018  . Dyslipidemia associated with type 2 diabetes mellitus ([place]) 01/06/2018  . Dyspepsia 01/06/2018  . OAB (overactive bladder) 01/06/2018  . History of prostate cancer 01/06/2018  . Current moderate episode of major depressive disorder without prior episode ([place]) 01/06/2018  . Anxiety about health 01/06/2018  . Lumbar spinal stenosis 12/15/2017  . Colostomy status  ([place]) 12/15/2017  . Hyperlipidemia 12/15/2017  . Hypertension associated with diabetes ([place]) 12/15/2017  . Gout 12/15/2017  . History of CVA (cerebrovascular accident) 12/15/2017    Past Surgical History:  Procedure Laterality Date  . CHOLECYSTECTOMY    . COLOSTOMY    . PROSTATE SURGERY         Home Medications    Prior to Admission medications   Medication Sig Start Date End Date Taking? Authorizing Provider  acetaminophen (TYLENOL) 650 MG CR tablet Take 1-2 tablets (650-1,300 mg total) by mouth every 8 (eight) hours as needed for pain. 04/20/18   Trixie Dredge, PA-C  allopurinol (ZYLOPRIM) 300 MG tablet TAKE 1 TABLET BY MOUTH EVERY DAY 06/29/18   Trixie Dredge, PA-C  amLODipine (NORVASC) 5 MG tablet Take 1 tablet (5 mg total) by mouth daily. 12/29/17   Trixie Dredge, PA-C  atorvastatin (LIPITOR) 20 MG tablet Take 1 tablet (20 mg total) by mouth at bedtime. 12/29/17   Trixie Dredge, PA-C  clopidogrel (PLAVIX) 75 MG tablet Take 1 tablet (75 mg total) by mouth daily. 07/27/18   Trixie Dredge, PA-C  HYDROcodone-acetaminophen (NORCO/VICODIN) 5-325 MG tablet Take 1 tablet by mouth every 8 (eight) hours as needed for moderate pain. 06/28/18   Silverio Decamp, MD  irbesartan (AVAPRO) 150 MG tablet Take 1 tablet (150 mg total) by mouth daily. 07/27/18   Trixie Dredge, PA-C  linagliptin (TRADJENTA) 5 MG  TABS tablet Take 1 tablet (5 mg total) by mouth daily. 06/15/18   Trixie Dredge, PA-C  methocarbamol (ROBAXIN) 500 MG tablet Take 1 tablet (500 mg total) by mouth 2 (two) times daily as needed for muscle spasms. 12/29/17   Trixie Dredge, PA-C  metoprolol tartrate (LOPRESSOR) 25 MG tablet Take 1 tablet (25 mg total) by mouth 2 (two) times daily. 04/27/18   Trixie Dredge, PA-C  mupirocin ointment (BACTROBAN) 2 % Apply 1 application topically 3 (three) times daily. 08/04/18    Kandra Nicolas, MD  nitroGLYCERIN (NITROSTAT) 0.6 MG SL tablet Place 0.6 mg under the tongue every 5 (five) minutes as needed for chest pain.    [provider]  omeprazole (PRILOSEC) 20 MG capsule Take 1 capsule (20 mg total) by mouth daily. 07/27/18   Trixie Dredge, PA-C  oxybutynin (DITROPAN-XL) 10 MG 24 hr tablet Take 10 mg by mouth daily. 05/14/18   [provider]  potassium chloride (MICRO-K) 10 MEQ CR capsule Take 1 capsule (10 mEq total) by mouth daily. 06/15/18   Trixie Dredge, PA-C  traMADol (ULTRAM) 50 MG tablet Take 1-1.5 tablets (50-75 mg total) by mouth every 6 (six) hours as needed for moderate pain or severe pain. Maximum 6 tabs per day. 04/20/18   Trixie Dredge, PA-C    Family History Family History  Family history unknown: Yes    Social History Social History   Tobacco Use  . Smoking status: Former Research scientist (life sciences)  . Smokeless tobacco: Never Used  Substance Use Topics  . Alcohol use: Not Currently  . Drug use: Never     Allergies   Aspirin, Shellfish allergy, Doxepin, and Sertraline   Review of Systems Review of Systems  Constitutional: Negative.   HENT: Negative.   Eyes: Negative.   Respiratory: Negative.   Cardiovascular: Negative.   Gastrointestinal: Negative.   Genitourinary: Negative.   Musculoskeletal: Negative.   Skin: Positive for color change and wound.  Neurological: Negative for headaches.     Physical Exam Triage Vital Signs ED Triage Vitals  Enc Vitals Group     BP 08/04/18 1753 113/69     Pulse Rate 08/04/18 1753 66     Resp 08/04/18 1753 18     Temp 08/04/18 1753 98.3 F (36.8 C)     Temp Source 08/04/18 1753 Oral     SpO2 08/04/18 1753 98 %     Weight 08/04/18 1754 210 lb (95.3 kg)     Height 08/04/18 1754 5\' 9"  (1.753 m)     Head Circumference --      Peak Flow --      Pain Score 08/04/18 1754 6     Pain Loc --      Pain Edu? --      Excl. in [place]? --    No data found.   Updated Vital Signs BP 113/69 (BP Location: Right Arm)   Pulse 66   Temp 98.3 F (36.8 C) (Oral)   Resp 18   Ht 5\' 9"  (1.753 m)   Wt 95.3 kg   SpO2 98%   BMI 31.01 kg/m   Visual Acuity Right Eye Distance:   Left Eye Distance:   Bilateral Distance:    Right Eye Near:   Left Eye Near:    Bilateral Near:     Physical Exam Vitals signs and nursing note reviewed.  Constitutional:      General: He is not in acute distress. HENT:  Head: Normocephalic.     Right Ear: External ear normal.     Left Ear: External ear normal.     Nose: Nose normal.     Mouth/Throat:     Pharynx: Oropharynx is clear.  Eyes:     Conjunctiva/sclera: Conjunctivae normal.     Pupils: Pupils are equal, round, and reactive to light.  Neck:     Musculoskeletal: Normal range of motion.  Cardiovascular:     Rate and Rhythm: Normal rate.  Pulmonary:     Effort: Pulmonary effort is normal.  Musculoskeletal:        General: No tenderness.     Right knee: He exhibits normal range of motion, no swelling, no effusion, no ecchymosis, no deformity and no laceration. No tenderness found.     Right hand: He exhibits normal range of motion, no tenderness, no bony tenderness and no swelling.       Hands:     Comments: Right thumb tip has an 27mm diameter skin avulsion with normal healing tissue beneath.  No swelling, erythema, drainage or tenderness.  Skin:    General: Skin is warm and dry.  Neurological:     Mental Status: He is alert.      UC Treatments / Results  Labs (all labs ordered are listed, but only abnormal results are displayed) Labs Reviewed - No data to display  EKG None  Radiology No results found.  Procedures Procedures (including critical care time)  Medications Ordered in UC Medications - No data to display  Initial Impression / Assessment and Plan / UC Course  I have reviewed the triage vital signs and the nursing notes.  Pertinent labs & imaging results that were  available during my care of the patient were reviewed by me and considered in my medical decision making (see chart for details).    Patient reassured; skin abrasion without evidence cellulitis. As a precaution will Rx Bactroban ointment. Followup with Family Doctor if not improved in one week.    Final Clinical Impressions(s) / UC Diagnoses   Final diagnoses:  Abrasion of left thumb, initial encounter     Discharge Instructions     Change bandage daily and apply antibiotic ointment until healed.  Keep bandage clean and dry.    ED Prescriptions    Medication Sig Dispense Auth. Provider   mupirocin ointment (BACTROBAN) 2 % Apply 1 application topically 3 (three) times daily. 15 g Kandra Nicolas, MD        Kandra Nicolas, MD 08/07/18 [phone]

## 2018-08-10 ENCOUNTER — Ambulatory Visit (INDEPENDENT_AMBULATORY_CARE_PROVIDER_SITE_OTHER): Payer: Medicare Other | Admitting: Physician Assistant

## 2018-08-10 ENCOUNTER — Other Ambulatory Visit: Payer: Self-pay | Admitting: Physician Assistant

## 2018-08-10 VITALS — BP 125/40 | HR 80 | Wt 209.0 lb

## 2018-08-10 DIAGNOSIS — I152 Hypertension secondary to endocrine disorders: Secondary | ICD-10-CM

## 2018-08-10 DIAGNOSIS — I10 Essential (primary) hypertension: Secondary | ICD-10-CM

## 2018-08-10 DIAGNOSIS — Z8673 Personal history of transient ischemic attack (TIA), and cerebral infarction without residual deficits: Secondary | ICD-10-CM

## 2018-08-10 DIAGNOSIS — E1159 Type 2 diabetes mellitus with other circulatory complications: Secondary | ICD-10-CM

## 2018-08-10 DIAGNOSIS — E1169 Type 2 diabetes mellitus with other specified complication: Secondary | ICD-10-CM

## 2018-08-10 DIAGNOSIS — N3281 Overactive bladder: Secondary | ICD-10-CM

## 2018-08-10 MED ORDER — AMLODIPINE BESYLATE 2.5 MG PO TABS: 2.5000 mg | ORAL_TABLET | Freq: Every day | ORAL | 1 refills | Status: DC

## 2018-08-10 NOTE — Patient Instructions (Signed)
Continue the Metoprolol 25 mg twice daily. Take the Irbesartan 150 mg every morning. A new prescription for Amlodipine 2.5 mg has been sent to the pharmacy. Follow up in 2 weeks for a nurse visit blood pressure check. Bring your blood pressure monitor and all your medications.

## 2018-08-10 NOTE — Progress Notes (Signed)
Established Patient Office Visit  Subjective:  Patient ID: Damon Acosta, male    DOB: 12-Jul-1940  Age: 78 y.o. MRN: [id number]  CC:  Chief Complaint  Patient presents with  . Hypertension  . Diabetes    HPI Damon Acosta presents for blood pressure check. Denies chest pain, shortness of breath, dizziness or headaches. He did bring in his glucose and blood pressure log. It will be scanned into the chart.   Fasting Glucose Range 6/17-6/30: 90-137  Home BP range SBP 111-171 DBP 40-89  Past Medical History:  Diagnosis Date  . Anxiety   . Arthritis   . Cancer ([place])   . 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 Diabetes mellitus without complication ([place])   . Gout   . Hyperlipidemia   . Hypertension   . 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 Overactive bladder   . Sleep apnea   . Stroke [facility])     Past Surgical History:  Procedure Laterality Date  . CHOLECYSTECTOMY    . COLOSTOMY    . PROSTATE SURGERY      Family History  Family history unknown: Yes    Social History   Socioeconomic History  . Marital status: Married    Spouse name: Not on file  . Number of children: Not on file  . Years of education: Not on file  . Highest education level: Not on file  Occupational History  . Not on file  Social Needs  . Financial resource strain: Not on file  . Food insecurity    Worry: Not on file    Inability: Not on file  . Transportation needs    Medical: Not on file    Non-medical: Not on file  Tobacco Use  . Smoking status: Former Research scientist (life sciences)  . Smokeless tobacco: Never Used  Substance and Sexual Activity  . Alcohol use: Not Currently  . Drug use: Never  . Sexual activity: Not Currently  Lifestyle  . Physical activity    Days per week: Not on file    Minutes per session: Not on file  . Stress: Not on file  Relationships  . Social Herbalist on phone: Not on file    Gets together: Not on file    Attends religious service: Not on file   Active member of club or organization: Not on file    Attends meetings of clubs or organizations: Not on file    Relationship status: Not on file  . Intimate partner violence    Fear of current or ex partner: Not on file    Emotionally abused: Not on file    Physically abused: Not on file    Forced sexual activity: Not on file  Other Topics Concern  . Not on file  Social History Narrative  . Not on file    Outpatient Medications Prior to Visit  Medication Sig Dispense Refill  . acetaminophen (TYLENOL) 650 MG CR tablet Take 1-2 tablets (650-1,300 mg total) by mouth every 8 (eight) hours as needed for pain. 90 tablet 3  . allopurinol (ZYLOPRIM) 300 MG tablet TAKE 1 TABLET BY MOUTH EVERY DAY 90 tablet 1  . atorvastatin (LIPITOR) 20 MG tablet TAKE 1 TABLET BY MOUTH EVERYDAY AT BEDTIME 90 tablet 0  . clopidogrel (PLAVIX) 75 MG tablet Take 1 tablet (75 mg total) by mouth daily. 90 tablet 2  . HYDROcodone-acetaminophen (NORCO/VICODIN) 5-325 MG tablet Take 1 tablet by mouth every 8 (eight) hours as needed for moderate  pain. 90 tablet 0  . irbesartan (AVAPRO) 150 MG tablet Take 1 tablet (150 mg total) by mouth daily. 90 tablet 1  . linagliptin (TRADJENTA) 5 MG TABS tablet Take 1 tablet (5 mg total) by mouth daily. 90 tablet 0  . methocarbamol (ROBAXIN) 500 MG tablet Take 1 tablet (500 mg total) by mouth 2 (two) times daily as needed for muscle spasms. 60 tablet 3  . metoprolol tartrate (LOPRESSOR) 25 MG tablet Take 1 tablet (25 mg total) by mouth 2 (two) times daily. 180 tablet 1  . mupirocin ointment (BACTROBAN) 2 % Apply 1 application topically 3 (three) times daily. 15 g 0  . nitroGLYCERIN (NITROSTAT) 0.6 MG SL tablet Place 0.6 mg under the tongue every 5 (five) minutes as needed for chest pain.    Marland Kitchen omeprazole (PRILOSEC) 20 MG capsule Take 1 capsule (20 mg total) by mouth daily. 180 capsule 0  . oxybutynin (DITROPAN) 5 MG tablet TAKE 1 TABLET BY MOUTH TWICE A DAY 180 tablet 0  . oxybutynin  (DITROPAN-XL) 10 MG 24 hr tablet Take 10 mg by mouth daily.    . potassium chloride (MICRO-K) 10 MEQ CR capsule Take 1 capsule (10 mEq total) by mouth daily. 90 capsule 0  . traMADol (ULTRAM) 50 MG tablet Take 1-1.5 tablets (50-75 mg total) by mouth every 6 (six) hours as needed for moderate pain or severe pain. Maximum 6 tabs per day. 90 tablet 0  . amLODipine (NORVASC) 5 MG tablet Take 1 tablet (5 mg total) by mouth daily. 90 tablet 1   No facility-administered medications prior to visit.     Allergies  Allergen Reactions  . Aspirin Swelling  . Shellfish Allergy   . Doxepin Other (See Comments)    Dry mouth  . Sertraline Diarrhea    ROS Review of Systems    Objective:    Physical Exam  BP (!) 125/40   Pulse 80   Wt 209 lb (94.8 kg)   SpO2 99%   BMI 30.86 kg/m  Wt Readings from Last 3 Encounters:  08/10/18 209 lb (94.8 kg)  08/04/18 210 lb (95.3 kg)  07/27/18 210 lb (95.3 kg)     There are no preventive care reminders to display for this patient.  There are no preventive care reminders to display for this patient.  Lab Results  Component Value Date   TSH 2.32 12/15/2017   Lab Results  Component Value Date   WBC 8.9 07/27/2018   HGB 11.1 (L) 07/27/2018   HCT 35.8 (L) 07/27/2018   MCV 79.9 (L) 07/27/2018   PLT 197 07/27/2018   Lab Results  Component Value Date   NA 144 07/27/2018   K 4.9 07/27/2018   CO2 23 07/27/2018   GLUCOSE 75 07/27/2018   BUN 23 07/27/2018   CREATININE 1.33 (H) 07/27/2018   BILITOT 0.5 12/15/2017   AST 16 12/15/2017   ALT 14 12/15/2017   PROT 6.5 12/15/2017   CALCIUM 9.1 07/27/2018   Lab Results  Component Value Date   CHOL 117 12/15/2017   Lab Results  Component Value Date   HDL 43 12/15/2017   Lab Results  Component Value Date   LDLCALC 61 12/15/2017   Lab Results  Component Value Date   TRIG 58 12/15/2017   Lab Results  Component Value Date   CHOLHDL 2.7 12/15/2017   Lab Results  Component Value Date    HGBA1C 6.0 07/27/2018      Assessment & Plan:  Blood pressure  check - Per Evlyn Clines, Reduce Amlodipine to 2.5 mg QHS. Continue the Metoprolol 25 mg twice daily. Take the Irbesartan 150 mg every morning. A new prescription for Amlodipine 2.5 mg has been sent to the pharmacy. Follow up in 2 weeks for a nurse visit blood pressure check. Bring your blood pressure monitor and all your medications.   Problem List Items Addressed This Visit    Hypertension associated with diabetes ([place]) - Primary   Relevant Medications   amLODipine (NORVASC) 2.5 MG tablet      Meds ordered this encounter  Medications  . amLODipine (NORVASC) 2.5 MG tablet    Sig: Take 1 tablet (2.5 mg total) by mouth daily.    Dispense:  30 tablet    Refill:  1    Follow-up: Return in about 2 weeks (around 08/24/2018) for nurse blood pressure check. Durene Romans, Monico Blitz, [place]

## 2018-08-15 ENCOUNTER — Encounter: Payer: Self-pay | Admitting: Physician Assistant

## 2018-08-24 ENCOUNTER — Ambulatory Visit (INDEPENDENT_AMBULATORY_CARE_PROVIDER_SITE_OTHER): Payer: Medicare Other | Admitting: Physician Assistant

## 2018-08-24 ENCOUNTER — Other Ambulatory Visit: Payer: Self-pay

## 2018-08-24 DIAGNOSIS — I10 Essential (primary) hypertension: Secondary | ICD-10-CM

## 2018-08-24 DIAGNOSIS — E1159 Type 2 diabetes mellitus with other circulatory complications: Secondary | ICD-10-CM

## 2018-08-24 DIAGNOSIS — I152 Hypertension secondary to endocrine disorders: Secondary | ICD-10-CM

## 2018-08-24 MED ORDER — IRBESARTAN 75 MG PO TABS: 75.0000 mg | ORAL_TABLET | Freq: Every day | ORAL | 0 refills | Status: DC

## 2018-08-24 MED ORDER — AMBULATORY NON FORMULARY MEDICATION: 0 refills | Status: DC

## 2018-08-24 NOTE — Progress Notes (Signed)
Patient presents to office for blood pressure readings. At last OV on 08-10-18 his BP was 125/40.  Today, our machine measure BP at 102/54. Patient brought the machine he has been using and it read 127/69.   RX was printed for patient to take to pharmacy for new blood pressure monitor.  PCP advised to decrease dose of Irbesartan to 75 mg, RX sent.    Pt educated of medication changes and print out of updated med list was given.

## 2018-09-01 ENCOUNTER — Other Ambulatory Visit: Payer: Self-pay | Admitting: Physician Assistant

## 2018-09-10 ENCOUNTER — Other Ambulatory Visit: Payer: Self-pay | Admitting: Physician Assistant

## 2018-09-10 DIAGNOSIS — Z8719 Personal history of other diseases of the digestive system: Secondary | ICD-10-CM

## 2018-09-10 DIAGNOSIS — R1013 Epigastric pain: Secondary | ICD-10-CM

## 2018-09-16 ENCOUNTER — Other Ambulatory Visit: Payer: Self-pay | Admitting: Physician Assistant

## 2018-09-16 DIAGNOSIS — E1159 Type 2 diabetes mellitus with other circulatory complications: Secondary | ICD-10-CM

## 2018-09-16 DIAGNOSIS — I152 Hypertension secondary to endocrine disorders: Secondary | ICD-10-CM

## 2018-09-21 ENCOUNTER — Ambulatory Visit: Payer: Medicare Other

## 2018-09-21 ENCOUNTER — Other Ambulatory Visit: Payer: Self-pay

## 2018-09-21 ENCOUNTER — Ambulatory Visit (INDEPENDENT_AMBULATORY_CARE_PROVIDER_SITE_OTHER): Payer: Medicare Other | Admitting: Sports Medicine

## 2018-09-21 DIAGNOSIS — M48062 Spinal stenosis, lumbar region with neurogenic claudication: Secondary | ICD-10-CM

## 2018-09-21 MED ORDER — PREDNISONE 50 MG PO TABS: ORAL_TABLET | ORAL | 0 refills | Status: DC

## 2018-09-21 NOTE — Assessment & Plan Note (Addendum)
Currently under the care of Dr. Lynann Bologna, the plan was for epidurals, and if this fails an L2 to pelvic fusion, extending his L4-L5 fusion. It does not sound like he had his L2-L3 interlaminar epidural. I am going to reorder his epidurals, bilateral L2-L3 transforaminal. I am also going to go ahead and give him a burst of prednisone. He will need to stop Plavix 5 days before his injection

## 2018-09-21 NOTE — Progress Notes (Signed)
Subjective:    CC: Follow-up  HPI: This is a pleasant 78 year old male with severe L2-L4 spinal stenosis, he has been managed extensively by spine surgery, the current plan was lumbar epidurals and if ineffective extending the fusion from L4-L5 to L2-pelvis.  He is back with severe pain low back, no bowel or bladder dysfunction, no saddle numbness, no constitutional symptoms.  I reviewed the past medical history, family history, social history, surgical history, and allergies today and no changes were needed.  Please see the problem list section below in epic for further details.  Past Medical History: Past Medical History:  Diagnosis Date  . Anxiety   . Arthritis   . Cancer ([place])   . 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 Diabetes mellitus without complication ([place])   . Gout   . Hyperlipidemia   . Hypertension   . 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 Overactive bladder   . Sleep apnea   . Stroke [facility])    Past Surgical History: Past Surgical History:  Procedure Laterality Date  . CHOLECYSTECTOMY    . COLOSTOMY    . PROSTATE SURGERY     Social History: Social History   Socioeconomic History  . Marital status: Married    Spouse name: Not on file  . Number of children: Not on file  . Years of education: Not on file  . Highest education level: Not on file  Occupational History  . Not on file  Social Needs  . Financial resource strain: Not on file  . Food insecurity    Worry: Not on file    Inability: Not on file  . Transportation needs    Medical: Not on file    Non-medical: Not on file  Tobacco Use  . Smoking status: Former Research scientist (life sciences)  . Smokeless tobacco: Never Used  Substance and Sexual Activity  . Alcohol use: Not Currently  . Drug use: Never  . Sexual activity: Not Currently  Lifestyle  . Physical activity    Days per week: Not on file    Minutes per session: Not on file  . Stress: Not on file  Relationships  . Social Herbalist on  phone: Not on file    Gets together: Not on file    Attends religious service: Not on file    Active member of club or organization: Not on file    Attends meetings of clubs or organizations: Not on file    Relationship status: Not on file  Other Topics Concern  . Not on file  Social History Narrative  . Not on file   Family History: Family History  Family history unknown: Yes   Allergies: Allergies  Allergen Reactions  . Aspirin Swelling  . Shellfish Allergy   . Doxepin Other (See Comments)    Dry mouth  . Sertraline Diarrhea   Medications: See med rec.  Review of Systems: No fevers, chills, night sweats, weight loss, chest pain, or shortness of breath.   Objective:    General: Well Developed, well nourished, and in no acute distress.  Neuro: Alert and oriented x3, extra-ocular muscles intact, sensation grossly intact.  HEENT: Normocephalic, atraumatic, pupils equal round reactive to light, neck supple, no masses, no lymphadenopathy, thyroid nonpalpable.  Skin: Warm and dry, no rashes. Cardiac: Regular rate and rhythm, no murmurs rubs or gallops, no lower extremity edema.  Respiratory: Clear to auscultation bilaterally. Not using accessory muscles, speaking in full sentences.  Impression and Recommendations:    Lumbar  spinal stenosis Currently under the care of Dr. Lynann Bologna, the plan was for epidurals, and if this fails an L2 to pelvic fusion, extending his L4-L5 fusion. It does not sound like he had his L2-L3 interlaminar epidural. I am going to reorder his epidurals, bilateral L2-L3 transforaminal. I am also going to go ahead and give him a burst of prednisone. He will need to stop Plavix 5 days before his injection  I spent 25 minutes with this patient, greater than 50% was face-to-face time counseling regarding the above diagnoses.  ___________________________________________ Gwen Her. Dianah Field, M.D., ABFM., CAQSM. Primary Care and Sports Medicine [facility]  Adjunct Professor of [place] [employer] [facility] [employer]

## 2018-09-23 ENCOUNTER — Other Ambulatory Visit: Payer: Self-pay | Admitting: Physician Assistant

## 2018-09-23 DIAGNOSIS — E1159 Type 2 diabetes mellitus with other circulatory complications: Secondary | ICD-10-CM

## 2018-09-23 DIAGNOSIS — I152 Hypertension secondary to endocrine disorders: Secondary | ICD-10-CM

## 2018-10-01 ENCOUNTER — Other Ambulatory Visit: Payer: Self-pay

## 2018-10-01 ENCOUNTER — Ambulatory Visit
Admission: RE | Admit: 2018-10-01 | Discharge: 2018-10-01 | Disposition: A | Discharge status: Home / Self Care | Payer: Medicare Other | Source: Ambulatory Visit | Attending: Sports Medicine | Admitting: Sports Medicine

## 2018-10-01 DIAGNOSIS — M48061 Spinal stenosis, lumbar region without neurogenic claudication: Secondary | ICD-10-CM | POA: Diagnosis not present

## 2018-10-01 MED ORDER — METHYLPREDNISOLONE ACETATE 40 MG/ML INJ SUSP (RADIOLOG
120.0000 mg | Freq: Once | INTRAMUSCULAR | Status: AC
  Administered 2018-10-01: 120 mg via EPIDURAL

## 2018-10-01 MED ORDER — IOPAMIDOL (ISOVUE-M 200) INJECTION 41%
1.0000 mL | Freq: Once | INTRAMUSCULAR | Status: AC
  Administered 2018-10-01: 1 mL via EPIDURAL

## 2018-10-01 NOTE — Discharge Instructions (Signed)

## 2018-10-06 ENCOUNTER — Telehealth: Payer: Self-pay | Admitting: Physician Assistant

## 2018-10-06 NOTE — Telephone Encounter (Signed)
PCP has dropped off a letter from [place] about a lower cost medication. I have left a message for patient to call back.

## 2018-10-07 NOTE — Telephone Encounter (Addendum)
Left brief VM for patient to call back about Doxepin and let me know if he is still taking the medication per PCP recommendation.

## 2018-10-08 NOTE — Telephone Encounter (Signed)
Did not receive a call back. Closing encounter.  ?

## 2018-10-26 ENCOUNTER — Encounter: Payer: Self-pay | Admitting: Sports Medicine

## 2018-10-26 ENCOUNTER — Ambulatory Visit (INDEPENDENT_AMBULATORY_CARE_PROVIDER_SITE_OTHER): Payer: Medicare Other | Admitting: Sports Medicine

## 2018-10-26 ENCOUNTER — Other Ambulatory Visit: Payer: Self-pay

## 2018-10-26 DIAGNOSIS — M48062 Spinal stenosis, lumbar region with neurogenic claudication: Secondary | ICD-10-CM

## 2018-10-26 DIAGNOSIS — Z23 Encounter for immunization: Secondary | ICD-10-CM | POA: Diagnosis not present

## 2018-10-26 NOTE — Progress Notes (Signed)
Subjective:    CC: Follow-up  HPI: Damon Acosta is severe spinal stenosis, he did extremely well with a right L2-L3 transforaminal epidural, still having a slight recurrence of pain.  I reviewed the past medical history, family history, social history, surgical history, and allergies today and no changes were needed.  Please see the problem list section below in epic for further details.  Past Medical History: Past Medical History:  Diagnosis Date  . Anxiety   . Arthritis   . Cancer ([place])   . 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 Diabetes mellitus without complication ([place])   . Gout   . Hyperlipidemia   . Hypertension   . 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 Overactive bladder   . Sleep apnea   . Stroke [facility])    Past Surgical History: Past Surgical History:  Procedure Laterality Date  . CHOLECYSTECTOMY    . COLOSTOMY    . PROSTATE SURGERY     Social History: Social History   Socioeconomic History  . Marital status: Married    Spouse name: Not on file  . Number of children: Not on file  . Years of education: Not on file  . Highest education level: Not on file  Occupational History  . Not on file  Social Needs  . Financial resource strain: Not on file  . Food insecurity    Worry: Not on file    Inability: Not on file  . Transportation needs    Medical: Not on file    Non-medical: Not on file  Tobacco Use  . Smoking status: Former Research scientist (life sciences)  . Smokeless tobacco: Never Used  Substance and Sexual Activity  . Alcohol use: Not Currently  . Drug use: Never  . Sexual activity: Not Currently  Lifestyle  . Physical activity    Days per week: Not on file    Minutes per session: Not on file  . Stress: Not on file  Relationships  . Social Herbalist on phone: Not on file    Gets together: Not on file    Attends religious service: Not on file    Active member of club or organization: Not on file    Attends meetings of clubs or organizations: Not on  file    Relationship status: Not on file  Other Topics Concern  . Not on file  Social History Narrative  . Not on file   Family History: Family History  Family history unknown: Yes   Allergies: Allergies  Allergen Reactions  . Aspirin Swelling  . Shellfish Allergy   . Doxepin Other (See Comments)    Dry mouth  . Sertraline Diarrhea   Medications: See med rec.  Review of Systems: No fevers, chills, night sweats, weight loss, chest pain, or shortness of breath.   Objective:    General: Well Developed, well nourished, and in no acute distress.  Neuro: Alert and oriented x3, extra-ocular muscles intact, sensation grossly intact.  HEENT: Normocephalic, atraumatic, pupils equal round reactive to light, neck supple, no masses, no lymphadenopathy, thyroid nonpalpable.  Skin: Warm and dry, no rashes. Cardiac: Regular rate and rhythm, no murmurs rubs or gallops, no lower extremity edema.  Respiratory: Clear to auscultation bilaterally. Not using accessory muscles, speaking in full sentences.  Impression and Recommendations:    Lumbar spinal stenosis Co-managed with Dr. Lynann Bologna, the plan was L2 to pelvic fusion extending his L4-L5 fusion if epidurals failed however he did have a fantastic response from a right L2-L3 transforaminal epidural.  Still has some discomfort so we are going to order epidural #2 and #3, right L2-L3 transforaminal. Return to see me in about 3 months.   ___________________________________________ Gwen Her. Dianah Field, M.D., ABFM., CAQSM. Primary Care and Sports Medicine [facility]  Adjunct Professor of [place] [employer] [facility] [employer] [facility] [employer]

## 2018-10-26 NOTE — Assessment & Plan Note (Signed)
Co-managed with Dr. Lynann Bologna, the plan was L2 to pelvic fusion extending his L4-L5 fusion if epidurals failed however he did have a fantastic response from a right L2-L3 transforaminal epidural. Still has some discomfort so we are going to order epidural #2 and #3, right L2-L3 transforaminal. Return to see me in about 3 months.

## 2018-11-02 DIAGNOSIS — E1142 Type 2 diabetes mellitus with diabetic polyneuropathy: Secondary | ICD-10-CM | POA: Diagnosis not present

## 2018-11-02 DIAGNOSIS — B351 Tinea unguium: Secondary | ICD-10-CM | POA: Diagnosis not present

## 2018-11-03 ENCOUNTER — Other Ambulatory Visit: Payer: Self-pay | Admitting: Physician Assistant

## 2018-11-03 DIAGNOSIS — E1169 Type 2 diabetes mellitus with other specified complication: Secondary | ICD-10-CM

## 2018-11-03 DIAGNOSIS — N3281 Overactive bladder: Secondary | ICD-10-CM

## 2018-11-03 DIAGNOSIS — Z8673 Personal history of transient ischemic attack (TIA), and cerebral infarction without residual deficits: Secondary | ICD-10-CM

## 2018-11-03 DIAGNOSIS — E785 Hyperlipidemia, unspecified: Secondary | ICD-10-CM

## 2018-11-05 ENCOUNTER — Ambulatory Visit
Admission: RE | Admit: 2018-11-05 | Discharge: 2018-11-05 | Disposition: A | Discharge status: Home / Self Care | Payer: Medicare Other | Source: Ambulatory Visit | Attending: Sports Medicine | Admitting: Sports Medicine

## 2018-11-05 ENCOUNTER — Other Ambulatory Visit: Payer: Self-pay

## 2018-11-05 DIAGNOSIS — M5126 Other intervertebral disc displacement, lumbar region: Secondary | ICD-10-CM | POA: Diagnosis not present

## 2018-11-05 DIAGNOSIS — M48061 Spinal stenosis, lumbar region without neurogenic claudication: Secondary | ICD-10-CM | POA: Diagnosis not present

## 2018-11-05 DIAGNOSIS — M48062 Spinal stenosis, lumbar region with neurogenic claudication: Secondary | ICD-10-CM

## 2018-11-05 MED ORDER — IOPAMIDOL (ISOVUE-M 200) INJECTION 41%
1.0000 mL | Freq: Once | INTRAMUSCULAR | Status: AC
  Administered 2018-11-05: 1 mL via EPIDURAL

## 2018-11-05 MED ORDER — METHYLPREDNISOLONE ACETATE 40 MG/ML INJ SUSP (RADIOLOG
120.0000 mg | Freq: Once | INTRAMUSCULAR | Status: AC
  Administered 2018-11-05: 12:00:00 120 mg via EPIDURAL

## 2018-11-05 NOTE — Discharge Instructions (Signed)

## 2018-11-17 ENCOUNTER — Other Ambulatory Visit: Payer: Self-pay

## 2018-11-17 DIAGNOSIS — E1122 Type 2 diabetes mellitus with diabetic chronic kidney disease: Secondary | ICD-10-CM

## 2018-11-17 MED ORDER — LINAGLIPTIN 5 MG PO TABS: 5.0000 mg | ORAL_TABLET | Freq: Every day | ORAL | 0 refills | Status: DC

## 2018-11-17 NOTE — Telephone Encounter (Signed)
Damon Acosta needs a refill of Tradjenta. He will call back to make an appointment with Dr Sheppard Coil in the near future.

## 2018-12-08 ENCOUNTER — Ambulatory Visit (INDEPENDENT_AMBULATORY_CARE_PROVIDER_SITE_OTHER): Payer: Medicare Other | Admitting: Osteopathic Medicine

## 2018-12-08 ENCOUNTER — Encounter: Payer: Self-pay | Admitting: Osteopathic Medicine

## 2018-12-08 ENCOUNTER — Other Ambulatory Visit: Payer: Self-pay

## 2018-12-08 VITALS — BP 131/79 | HR 91 | Temp 97.8°F | Wt 213.0 lb

## 2018-12-08 DIAGNOSIS — I499 Cardiac arrhythmia, unspecified: Secondary | ICD-10-CM | POA: Diagnosis not present

## 2018-12-08 DIAGNOSIS — D649 Anemia, unspecified: Secondary | ICD-10-CM | POA: Diagnosis not present

## 2018-12-08 DIAGNOSIS — E1121 Type 2 diabetes mellitus with diabetic nephropathy: Secondary | ICD-10-CM | POA: Diagnosis not present

## 2018-12-08 DIAGNOSIS — M48062 Spinal stenosis, lumbar region with neurogenic claudication: Secondary | ICD-10-CM | POA: Diagnosis not present

## 2018-12-08 DIAGNOSIS — N1831 Chronic kidney disease, stage 3a: Secondary | ICD-10-CM

## 2018-12-08 LAB — POCT GLYCOSYLATED HEMOGLOBIN (HGB A1C): Hemoglobin A1C: 6.3 % — AB (ref 4.0–5.6)

## 2018-12-08 MED ORDER — AMLODIPINE BESYLATE 2.5 MG PO TABS: 2.5000 mg | ORAL_TABLET | Freq: Every day | ORAL | 3 refills | Status: DC

## 2018-12-08 MED ORDER — METHOCARBAMOL 500 MG PO TABS: 500.0000 mg | ORAL_TABLET | Freq: Two times a day (BID) | ORAL | 1 refills | Status: DC | PRN

## 2018-12-08 MED ORDER — OXYBUTYNIN CHLORIDE ER 10 MG PO TB24: 10.0000 mg | ORAL_TABLET | Freq: Every day | ORAL | 3 refills | Status: DC

## 2018-12-08 NOTE — Progress Notes (Signed)
HPI: Damon Acosta is a 78 y.o. male who  has a past medical history of Anxiety, Arthritis, Cancer ([place]), Chronic intestinal pseudo-obstruction (01/12/2018), Diabetes mellitus without complication ([place]), Gout, Hyperlipidemia, Hypertension, Ogilvie's syndrome (01/12/2018), Overactive bladder, Sleep apnea, and Stroke ([place]).  he presents to [facility] today, 12/08/18,  for chief complaint of:  DM2 CKD   DIABETES SCREENING/PREVENTIVE CARE: A1C past 3-6 mos: Yes  controlled? Yes   07/27/2018: 6.0  Today 12/08/18:   BP goal <130/80: Yes   BP Readings from Last 3 Encounters:  12/08/18 131/79  11/05/18 (!) 141/94  10/26/18 105/60   LDL goal <70: yes, was 61 in 12/2017   Eye exam annually: done 2019, importance discussed with patient Foot exam: Yes    Microalbuminuria:n/a on ARB   Metformin: No  ACE/ARB: Yes  Antiplatelet if ASCVD Risk >10%: on Plavix Statin: Yes but lower potency - ok for age   Pneumovax: Yes  Immunization History  Administered Date(s) Administered  . Influenza,inj,Quad PF,6+ Mos 10/25/2018  . Pneumococcal Polysaccharide-23 01/26/2018      CKD3 with associated anemia: Last metabolic panel 99991111 showed creatinine 1.72 Last CBC 04/27/2018 with hemoglobin 10.7, hematocrit 33.9, MCV 78 -FOBT x3 was negative, iron studies okay, negative hemoglobinopathy evaluation    At today's visit 12/10/18 ... PMH, PSH, FH reviewed and updated as needed.  Current medication list and allergy/intolerance hx reviewed and updated as needed. (See remainder of HPI, ROS, Phys Exam below)   No results found.  Results for orders placed or performed in visit on 12/08/18 (from the past 72 hour(s))  POCT HgB A1C     Status: Abnormal   Collection Time: 12/08/18  4:49 PM  Result Value Ref Range   Hemoglobin A1C 6.3 (A) 4.0 - 5.6 %   HbA1c POC (<> result, manual entry)     HbA1c, POC (prediabetic range)     HbA1c, POC (controlled diabetic  range)      EKG shows PVCs, no concerns for A. fib      ASSESSMENT/PLAN: The primary encounter diagnosis was Type 2 diabetes mellitus with stage 3a chronic kidney disease, without long-term current use of insulin ([place]). Diagnoses of Irregular cardiac rhythm, Spinal stenosis of lumbar region with neurogenic claudication, and Anemia, unspecified type were also pertinent to this visit.   Patient was very upset when I suggested blood draw today, apparently there was some issues with the lab and billing downstairs.  He declines blood draw at this time, I advised him that we may be missing potential deteriorations in function particularly kidney function which can be asymptomatic.  He voices understanding of risks versus benefits but would like to hold off on getting labs until January  Orders Placed This Encounter  Procedures  . COMPLETE METABOLIC PANEL WITH GFR  . CBC  . POCT HgB A1C  . EKG 12-Lead     Meds ordered this encounter  Medications  . oxybutynin (DITROPAN-XL) 10 MG 24 hr tablet    Sig: Take 1 tablet (10 mg total) by mouth daily.    Dispense:  90 tablet    Refill:  3    Cancel 5 mg thanks  . methocarbamol (ROBAXIN) 500 MG tablet    Sig: Take 1 tablet (500 mg total) by mouth 2 (two) times daily as needed for muscle spasms.    Dispense:  180 tablet    Refill:  1  . amLODipine (NORVASC) 2.5 MG tablet    Sig: Take 1  tablet (2.5 mg total) by mouth daily.    Dispense:  90 tablet    Refill:  3    Patient Instructions  Plan:  Everything looks pretty good today!   Your heart beat sounds a little irregular but it is due to something called Premature Contractions which is when the heart occasionally beats a bit early. This is not a big deal, but we might do EKG's a few times per year to monitor. If you feel heart flutters or chest pain, please let us know!   Sugars are under good control! No problems. I think since sugars and blood pressure have been good, we can monitor  blood work every 4 monthsor so.   I refilled medications as requested.       Follow-up plan: Return in about 3 months (around 03/10/2019) for labs and A1C.                                                 ################################################# ################################################# ################################################# #################################################    Current Meds  Medication Sig  . acetaminophen (TYLENOL) 650 MG CR tablet Take 1-2 tablets (650-1,300 mg total) by mouth every 8 (eight) hours as needed for pain.  Marland Kitchen allopurinol (ZYLOPRIM) 300 MG tablet TAKE 1 TABLET BY MOUTH EVERY DAY  . AMBULATORY NON FORMULARY MEDICATION Blood Pressure Monitor- use to check blood pressures 1-2 times daily  . amLODipine (NORVASC) 2.5 MG tablet Take 1 tablet (2.5 mg total) by mouth daily.  Marland Kitchen atorvastatin (LIPITOR) 20 MG tablet TAKE 1 TABLET BY MOUTH EVERYDAY AT BEDTIME  . clopidogrel (PLAVIX) 75 MG tablet Take 1 tablet (75 mg total) by mouth daily.  Marland Kitchen HYDROcodone-acetaminophen (NORCO/VICODIN) 5-325 MG tablet Take 1 tablet by mouth every 8 (eight) hours as needed for moderate pain.  Marland Kitchen irbesartan (AVAPRO) 75 MG tablet TAKE 1 TABLET BY MOUTH EVERY DAY  . linagliptin (TRADJENTA) 5 MG TABS tablet Take 1 tablet (5 mg total) by mouth daily.  . methocarbamol (ROBAXIN) 500 MG tablet Take 1 tablet (500 mg total) by mouth 2 (two) times daily as needed for muscle spasms.  . metoprolol tartrate (LOPRESSOR) 25 MG tablet Take 1 tablet (25 mg total) by mouth 2 (two) times daily.  . mupirocin ointment (BACTROBAN) 2 % Apply 1 application topically 3 (three) times daily.  . nitroGLYCERIN (NITROSTAT) 0.6 MG SL tablet Place 0.6 mg under the tongue every 5 (five) minutes as needed for chest pain.  Marland Kitchen omeprazole (PRILOSEC) 20 MG capsule TAKE 1 CAPSULE BY MOUTH TWICE A DAY  . oxybutynin (DITROPAN-XL) 10 MG 24 hr tablet Take 1  tablet (10 mg total) by mouth daily.  . potassium chloride (MICRO-K) 10 MEQ CR capsule TAKE 1 CAPSULE BY MOUTH EVERY DAY  . traMADol (ULTRAM) 50 MG tablet Take 1-1.5 tablets (50-75 mg total) by mouth every 6 (six) hours as needed for moderate pain or severe pain. Maximum 6 tabs per day.  . [DISCONTINUED] amLODipine (NORVASC) 2.5 MG tablet TAKE 1 TABLET BY MOUTH EVERY DAY  . [DISCONTINUED] methocarbamol (ROBAXIN) 500 MG tablet Take 1 tablet (500 mg total) by mouth 2 (two) times daily as needed for muscle spasms.  . [DISCONTINUED] oxybutynin (DITROPAN) 5 MG tablet TAKE 1 TABLET BY MOUTH TWICE A DAY  . [DISCONTINUED] oxybutynin (DITROPAN-XL) 10 MG 24 hr tablet Take 10 mg by mouth daily.    Allergies  Allergen Reactions  . Aspirin Swelling  . Shellfish Allergy   . Doxepin Other (See Comments)    Dry mouth  . Sertraline Diarrhea       Review of Systems:  Constitutional: No recent illness  HEENT: No  headache, no vision change  Cardiac: No  chest pain, No  pressure, No palpitations  Respiratory:  No  shortness of breath. No  Cough  Musculoskeletal: No new myalgia/arthralgia  Skin: No  Rash  Neurologic: No  weakness, No  Dizziness  Psychiatric: No  concerns with depression, No  concerns with anxiety  Exam:  BP 131/79 (BP Location: Right Arm, Patient Position: Sitting, Cuff Size: Normal)   Pulse 91   Temp 97.8 F (36.6 C) (Oral)   Wt 213 lb (96.6 kg)   BMI 31.45 kg/m   Constitutional: VS see above. General Appearance: alert, well-developed, well-nourished, NAD  Eyes: Normal lids and conjunctive, non-icteric sclera  Ears, Nose, Mouth, Throat: MMM, Normal external inspection ears/nares/mouth/lips/gums.  Neck: No masses, trachea midline.   Respiratory: Normal respiratory effort. no wheeze, no rhonchi, no rales  Cardiovascular: S1/S2 normal, irreg rhythm ?afib versus sinus arrhythmia vs pvc no rub/gallop auscultated. RRR.   Abdominal: non-tender, non-distended, no  appreciable organomegaly, neg Murphy's, BS WNLx4  Neurological: Normal balance/coordination. No tremor.  Skin: warm, dry, intact.   Psychiatric: Normal judgment/insight. Normal mood and affect. Oriented x3.       Visit summary with medication list and pertinent instructions was printed for patient to review, patient was advised to alert [name] if any updates are needed. All questions at time of visit were answered - patient instructed to contact office with any additional concerns. ER/RTC precautions were reviewed with the patient and understanding verbalized.   Note: Total time spent 25 minutes, greater than 50% of the visit was spent face-to-face counseling and coordinating care for the following: The primary encounter diagnosis was Type 2 diabetes mellitus with stage 3a chronic kidney disease, without long-term current use of insulin ([place]). Diagnoses of Irregular cardiac rhythm, Spinal stenosis of lumbar region with neurogenic claudication, and Anemia, unspecified type were also pertinent to this visit.Marland Kitchen  Please note: voice recognition software was used to produce this document, and typos may escape review. Please contact Dr. Sheppard Coil for any needed clarifications.    Follow up plan: Return in about 3 months (around 03/10/2019) for labs and A1C.

## 2018-12-08 NOTE — Patient Instructions (Signed)
Plan:  Everything looks pretty good today!   Your heart beat sounds a little irregular but it is due to something called Premature Contractions which is when the heart occasionally beats a bit early. This is not a big deal, but we might do EKG's a few times per year to monitor. If you feel heart flutters or chest pain, please let us know!   Sugars are under good control! No problems. I think since sugars and blood pressure have been good, we can monitor blood work every 4 monthsor so.   I refilled medications as requested.

## 2018-12-10 ENCOUNTER — Telehealth: Payer: Self-pay | Admitting: *Deleted

## 2018-12-10 DIAGNOSIS — M48062 Spinal stenosis, lumbar region with neurogenic claudication: Secondary | ICD-10-CM

## 2018-12-10 NOTE — Telephone Encounter (Signed)
Pt left vm stating that he needs another epidural.  He tried to schedule it himself but he needs the order placed.

## 2018-12-10 NOTE — Telephone Encounter (Signed)
Done

## 2018-12-13 NOTE — Telephone Encounter (Signed)
Damon Acosta at [facility] notified of order.

## 2018-12-21 ENCOUNTER — Other Ambulatory Visit: Payer: Self-pay | Admitting: Physician Assistant

## 2018-12-21 DIAGNOSIS — F321 Major depressive disorder, single episode, moderate: Secondary | ICD-10-CM

## 2018-12-21 DIAGNOSIS — F5105 Insomnia due to other mental disorder: Secondary | ICD-10-CM

## 2018-12-21 DIAGNOSIS — F409 Phobic anxiety disorder, unspecified: Secondary | ICD-10-CM

## 2018-12-21 DIAGNOSIS — Z8739 Personal history of other diseases of the musculoskeletal system and connective tissue: Secondary | ICD-10-CM

## 2018-12-24 ENCOUNTER — Ambulatory Visit
Admission: RE | Admit: 2018-12-24 | Discharge: 2018-12-24 | Disposition: A | Discharge status: Home / Self Care | Payer: Medicare Other | Source: Ambulatory Visit | Attending: Sports Medicine | Admitting: Sports Medicine

## 2018-12-24 ENCOUNTER — Other Ambulatory Visit: Payer: Self-pay

## 2018-12-24 DIAGNOSIS — M48061 Spinal stenosis, lumbar region without neurogenic claudication: Secondary | ICD-10-CM | POA: Diagnosis not present

## 2018-12-24 MED ORDER — METHYLPREDNISOLONE ACETATE 40 MG/ML INJ SUSP (RADIOLOG
120.0000 mg | Freq: Once | INTRAMUSCULAR | Status: AC
  Administered 2018-12-24: 120 mg via EPIDURAL

## 2018-12-24 MED ORDER — IOPAMIDOL (ISOVUE-M 200) INJECTION 41%
1.0000 mL | Freq: Once | INTRAMUSCULAR | Status: AC
  Administered 2018-12-24: 1 mL via EPIDURAL

## 2018-12-24 NOTE — Discharge Instructions (Signed)

## 2018-12-27 ENCOUNTER — Telehealth: Payer: Self-pay

## 2018-12-27 NOTE — Telephone Encounter (Signed)
Patient's wife called to let us know he isn't feeling any better after his LNRB #3 on 12/24/18.  I did confirm that he may use a heating pad and take any medications he has that they think might help make him more comfortable.  I did explain that the steroid may take up to a week to be absorbed and show any benefit, so maybe he will feel better in another day or two.  I encouraged her to call Dr. Dianah Field if his pain continues, as we have no more orders for any treatments for him.

## 2019-01-17 ENCOUNTER — Other Ambulatory Visit: Payer: Self-pay

## 2019-01-17 DIAGNOSIS — E785 Hyperlipidemia, unspecified: Secondary | ICD-10-CM

## 2019-01-17 DIAGNOSIS — Z8673 Personal history of transient ischemic attack (TIA), and cerebral infarction without residual deficits: Secondary | ICD-10-CM

## 2019-01-17 DIAGNOSIS — E1169 Type 2 diabetes mellitus with other specified complication: Secondary | ICD-10-CM

## 2019-01-17 MED ORDER — POTASSIUM CHLORIDE ER 10 MEQ PO CPCR: 10.0000 meq | ORAL_CAPSULE | Freq: Every day | ORAL | 0 refills | Status: DC

## 2019-01-17 MED ORDER — ATORVASTATIN CALCIUM 20 MG PO TABS: 20.0000 mg | ORAL_TABLET | Freq: Every day | ORAL | 0 refills | Status: DC

## 2019-01-17 NOTE — Telephone Encounter (Signed)
[facility] requests a refill on Atorvastatin and Potassium. Never prescribed by Dr Sheppard Coil.

## 2019-02-13 ENCOUNTER — Other Ambulatory Visit: Payer: Self-pay | Admitting: Physician Assistant

## 2019-02-13 DIAGNOSIS — I152 Hypertension secondary to endocrine disorders: Secondary | ICD-10-CM

## 2019-02-13 DIAGNOSIS — E1159 Type 2 diabetes mellitus with other circulatory complications: Secondary | ICD-10-CM

## 2019-03-10 ENCOUNTER — Ambulatory Visit (INDEPENDENT_AMBULATORY_CARE_PROVIDER_SITE_OTHER): Payer: Medicare Other | Admitting: Osteopathic Medicine

## 2019-03-10 ENCOUNTER — Encounter: Payer: Self-pay | Admitting: Osteopathic Medicine

## 2019-03-10 VITALS — BP 146/69 | HR 74 | Temp 98.4°F | Wt 211.0 lb

## 2019-03-10 DIAGNOSIS — Z8673 Personal history of transient ischemic attack (TIA), and cerebral infarction without residual deficits: Secondary | ICD-10-CM

## 2019-03-10 DIAGNOSIS — M48062 Spinal stenosis, lumbar region with neurogenic claudication: Secondary | ICD-10-CM

## 2019-03-10 DIAGNOSIS — M170 Bilateral primary osteoarthritis of knee: Secondary | ICD-10-CM | POA: Diagnosis not present

## 2019-03-10 DIAGNOSIS — Z8739 Personal history of other diseases of the musculoskeletal system and connective tissue: Secondary | ICD-10-CM | POA: Diagnosis not present

## 2019-03-10 DIAGNOSIS — E1169 Type 2 diabetes mellitus with other specified complication: Secondary | ICD-10-CM

## 2019-03-10 DIAGNOSIS — E1122 Type 2 diabetes mellitus with diabetic chronic kidney disease: Secondary | ICD-10-CM

## 2019-03-10 DIAGNOSIS — N1831 Chronic kidney disease, stage 3a: Secondary | ICD-10-CM

## 2019-03-10 DIAGNOSIS — E1121 Type 2 diabetes mellitus with diabetic nephropathy: Secondary | ICD-10-CM | POA: Diagnosis not present

## 2019-03-10 DIAGNOSIS — E785 Hyperlipidemia, unspecified: Secondary | ICD-10-CM

## 2019-03-10 DIAGNOSIS — R1013 Epigastric pain: Secondary | ICD-10-CM

## 2019-03-10 DIAGNOSIS — E1159 Type 2 diabetes mellitus with other circulatory complications: Secondary | ICD-10-CM

## 2019-03-10 DIAGNOSIS — J302 Other seasonal allergic rhinitis: Secondary | ICD-10-CM | POA: Diagnosis not present

## 2019-03-10 DIAGNOSIS — I10 Essential (primary) hypertension: Secondary | ICD-10-CM

## 2019-03-10 DIAGNOSIS — I152 Hypertension secondary to endocrine disorders: Secondary | ICD-10-CM

## 2019-03-10 LAB — POCT GLYCOSYLATED HEMOGLOBIN (HGB A1C): Hemoglobin A1C: 6 % — AB (ref 4.0–5.6)

## 2019-03-10 MED ORDER — ATORVASTATIN CALCIUM 20 MG PO TABS: 20.0000 mg | ORAL_TABLET | Freq: Every day | ORAL | 0 refills | Status: DC

## 2019-03-10 MED ORDER — LINAGLIPTIN 5 MG PO TABS: 5.0000 mg | ORAL_TABLET | Freq: Every day | ORAL | 0 refills | Status: DC

## 2019-03-10 MED ORDER — ALLOPURINOL 300 MG PO TABS: 300.0000 mg | ORAL_TABLET | Freq: Every day | ORAL | 3 refills | Status: DC

## 2019-03-10 MED ORDER — FLUTICASONE PROPIONATE 50 MCG/ACT NA SUSP: 2.0000 | Freq: Every day | NASAL | 3 refills | Status: DC

## 2019-03-10 MED ORDER — CLOPIDOGREL BISULFATE 75 MG PO TABS: 75.0000 mg | ORAL_TABLET | Freq: Every day | ORAL | 2 refills | Status: DC

## 2019-03-10 MED ORDER — OMEPRAZOLE 20 MG PO CPDR: 20.0000 mg | DELAYED_RELEASE_CAPSULE | Freq: Two times a day (BID) | ORAL | 1 refills | Status: DC

## 2019-03-10 MED ORDER — ACETAMINOPHEN ER 650 MG PO TBCR: 650.0000 mg | EXTENDED_RELEASE_TABLET | Freq: Three times a day (TID) | ORAL | 3 refills | Status: DC | PRN

## 2019-03-10 MED ORDER — POTASSIUM CHLORIDE ER 10 MEQ PO CPCR: 10.0000 meq | ORAL_CAPSULE | Freq: Every day | ORAL | 0 refills | Status: DC

## 2019-03-10 MED ORDER — IRBESARTAN 75 MG PO TABS: 75.0000 mg | ORAL_TABLET | Freq: Every day | ORAL | 1 refills | Status: DC

## 2019-03-10 MED ORDER — BLOOD GLUCOSE MONITOR KIT: PACK | 99 refills | Status: DC

## 2019-03-10 NOTE — Patient Instructions (Signed)
Even fairly severe allergies can typically be treated successfully with over-the-counter medications. I usually will recommend a combination of: . steroid nasal spray such as Flonase or Nasonex or one of their generic equivalents, PLUS... . an antihistamine such as Allegra, Zyrtec, or Claritin or one of their generic equivalents.  . Can also add a decongestant, either Sudafed on its own OR any of the antihistamines with the "-D" in the name that he has to show your ID to the pharmacist to buy.  . I recommend avoiding Afrin nasal spray unless absolutely needed, and then using it only for 2 days to prevent rebound congestion when the medicine is stopped.  If the combination isn't helping after about 2 weeks, let me know and I can send in a prescription antihistamine nasal spray to use with the oral medications as well. The above medicines will work for most people, though.    COVID vaccine:   Our providers are recommending the vaccine to all our patients, especially if you are a patient who has medical conditions that make you high-risk for serious COVID disease. As of right now there is NO known contraindication to getting the COVID vaccine other than allergy to the vaccine, which we would only know after your first dose! Meaning: everyone should be able to get this vaccine, even if they have other medical conditions or are pregnant. Side effects are always possible, but as with all vaccines, we believe the benefits outweigh the risks.   Important to remember: when people are having "reactions" to the vaccine, these reactions are almost always NORMAL immune system responses. OK to take Benadryl/diphenhydramine or Motrin/ibuprofen for these symptoms. If having any trouble breathing (signs of anaphylaxis) go to UC or ED. Very few people are having immune responses so strong they need to go to ED, but many people are not feeling well for 1 to 2 days after the vaccine is given.   We currently do not have  any information about whether our office will be distributing the COVID vaccine. You can contact your local Health Department or go online to  [email or web address] For questions related to vaccine distribution or appointments, please email [email or web address] or call [phone].

## 2019-03-10 NOTE — Progress Notes (Signed)
Damon Acosta is a 79 y.o. male who presents to  [place] [facility]  today, 03/10/19, seeking care for the following:  The primary encounter diagnosis was Type 2 diabetes mellitus with stage 3a chronic kidney disease, without long-term current use of insulin ([place]). Diagnoses of Spinal stenosis of lumbar region with neurogenic claudication, Primary osteoarthritis of both knees, History of gout, History of CVA (cerebrovascular accident), Dyslipidemia associated with type 2 diabetes mellitus ([place]), Hypertension associated with diabetes ([place]), Type 2 diabetes mellitus with chronic kidney disease, without long-term current use of insulin, unspecified CKD stage ([place]), and Dyspepsia were also pertinent to this visit.    ASSESSMENT & PLAN with other pertinent history/findings:  1. Type 2 diabetes mellitus with stage 3a chronic kidney disease, without long-term current use of insulin ([name])  DIABETES SCREENING/PREVENTIVE CARE: A1C past 3-6 mos: Yes  controlled? Yes   07/27/2018: 6.0  12/08/18: 6.3  Today 03/10/19: 6.0  BP goal <130/80: No       BP Readings  03/10/19  146/69  12/08/18 131/79  11/05/18 (!) 141/94  10/26/18 105/60   LDL goal <70: was 61 in 12/2017   Eye exam annually: done 2019, importance discussed with patient Foot exam: Yes    Microalbuminuria:n/a on ARB   Metformin: No  ACE/ARB: Yes  Antiplatelet if ASCVD Risk >10%: on Plavix Statin: Yes but lower potency - ok for age   Pneumovax: Yes      Immunization History  Administered Date(s) Administered  . Influenza,inj,Quad PF,6+ Mos 10/25/2018  . Pneumococcal Polysaccharide-23 01/26/2018      2. Spinal stenosis of lumbar region with neurogenic claudication 3. Primary osteoarthritis of both knees Follow w/ sports med / ortho as directed  4. History of gout continue allopurinol   5. History of CVA (cerebrovascular accident) Plavix, has declined  statin   6. Dyslipidemia associated with type 2 diabetes mellitus ([name]) Checking lipids   7. Hypertension associated with diabetes ([place]) Not at goal, pt reports home BP 130/80 or less  BP Readings from Last 3 Encounters:  03/10/19 (!) 146/69  12/24/18 (!) 136/101  12/08/18 131/79   8. Seasonal allergies See pt instructions        Orders Placed This Encounter  Procedures  . POCT HgB A1C    Meds ordered this encounter  Medications  . acetaminophen (TYLENOL) 650 MG CR tablet    Sig: Take 1-2 tablets (650-1,300 mg total) by mouth every 8 (eight) hours as needed for pain.    Dispense:  90 tablet    Refill:  3  . allopurinol (ZYLOPRIM) 300 MG tablet    Sig: Take 1 tablet (300 mg total) by mouth daily.    Dispense:  90 tablet    Refill:  3  . atorvastatin (LIPITOR) 20 MG tablet    Sig: Take 1 tablet (20 mg total) by mouth daily at 6 PM.    Dispense:  90 tablet    Refill:  0  . clopidogrel (PLAVIX) 75 MG tablet    Sig: Take 1 tablet (75 mg total) by mouth daily.    Dispense:  90 tablet    Refill:  2  . irbesartan (AVAPRO) 75 MG tablet    Sig: Take 1 tablet (75 mg total) by mouth daily.    Dispense:  90 tablet    Refill:  1  . linagliptin (TRADJENTA) 5 MG TABS tablet    Sig: Take 1 tablet (5 mg total) by  mouth daily.    Dispense:  90 tablet    Refill:  0  . omeprazole (PRILOSEC) 20 MG capsule    Sig: Take 1 capsule (20 mg total) by mouth 2 (two) times daily.    Dispense:  180 capsule    Refill:  1  . potassium chloride (MICRO-K) 10 MEQ CR capsule    Sig: Take 1 capsule (10 mEq total) by mouth daily.    Dispense:  90 capsule    Refill:  0  . blood glucose meter kit and supplies KIT    Sig: Dispense based on patient and insurance preference. Use up to four times daily as directed. Please include lancets, test strips, control solution.    Dispense:  1 each    Refill:  99    Order Specific Question:   Number of strips    Answer:   100    Order Specific Question:    Number of lancets    Answer:   100  . fluticasone (FLONASE) 50 MCG/ACT nasal spray    Sig: Place 2 sprays into both nostrils daily.    Dispense:  48 g    Refill:  3    Patient Instructions  Even fairly severe allergies can typically be treated successfully with over-the-counter medications. I usually will recommend a combination of: . steroid nasal spray such as Flonase or Nasonex or one of their generic equivalents, PLUS... . an antihistamine such as Allegra, Zyrtec, or Claritin or one of their generic equivalents.  . Can also add a decongestant, either Sudafed on its own OR any of the antihistamines with the "-D" in the name that he has to show your ID to the pharmacist to buy.  . I recommend avoiding Afrin nasal spray unless absolutely needed, and then using it only for 2 days to prevent rebound congestion when the medicine is stopped.  If the combination isn't helping after about 2 weeks, let me know and I can send in a prescription antihistamine nasal spray to use with the oral medications as well. The above medicines will work for most people, though.    COVID vaccine:   Our providers are recommending the vaccine to all our patients, especially if you are a patient who has medical conditions that make you high-risk for serious COVID disease. As of right now there is NO known contraindication to getting the COVID vaccine other than allergy to the vaccine, which we would only know after your first dose! Meaning: everyone should be able to get this vaccine, even if they have other medical conditions or are pregnant. Side effects are always possible, but as with all vaccines, we believe the benefits outweigh the risks.   Important to remember: when people are having "reactions" to the vaccine, these reactions are almost always NORMAL immune system responses. OK to take Benadryl/diphenhydramine or Motrin/ibuprofen for these symptoms. If having any trouble breathing (signs of anaphylaxis) go to  UC or ED. Very few people are having immune responses so strong they need to go to ED, but many people are not feeling well for 1 to 2 days after the vaccine is given.   We currently do not have any information about whether our office will be distributing the COVID vaccine. You can contact your local Health Department or go online to  [email or web address] For questions related to vaccine distribution or appointments, please email [email or web address] or call [phone].       Follow-up instructions: Return in about 4 months (around 07/08/2019)  for recheck A1C and BP .        There were no vitals taken for this visit.  No outpatient medications have been marked as taking for the 03/10/19 encounter (Office Visit) with Emeterio Reeve, DO.    No results found for this or any previous visit (from the past 72 hour(s)).  No results found.  Depression screen [facility] 2/9 02/23/2018 01/26/2018 12/29/2017  Decreased Interest '2 1 3  '$ Down, Depressed, Hopeless '1 1 3  '$ PHQ - 2 Score '3 2 6  '$ Altered sleeping '2 3 3  '$ Tired, decreased energy '2 2 1  '$ Change in appetite 1 2 0  Feeling bad or failure about yourself  '2 1 3  '$ Trouble concentrating 1 0 0  Moving slowly or fidgety/restless 1 0 1  Suicidal thoughts 2 0 2  PHQ-9 Score '14 10 16  '$ Difficult doing work/chores Not difficult at all Not difficult at all Somewhat difficult    GAD 7 : Generalized Anxiety Score 02/23/2018 01/26/2018 01/06/2018  Nervous, Anxious, on Edge '1 3 2  '$ Control/stop worrying '1 2 3  '$ Worry too much - different things '1 2 3  '$ Trouble relaxing '2 1 3  '$ Restless '1 2 2  '$ Easily annoyed or irritable '2 2 3  '$ Afraid - awful might happen '2 2 3  '$ Total GAD 7 Score '10 14 19  '$ Anxiety Difficulty Somewhat difficult Somewhat difficult Very difficult      All questions at time of visit were answered - patient instructed to contact office with any additional concerns or updates.   ER/RTC precautions were reviewed with the patient.  Please note: voice recognition software was used to produce this document, and typos may escape review. Please contact Dr. Sheppard Coil for any needed clarifications.

## 2019-03-10 NOTE — Telephone Encounter (Signed)
This encounter was created in error - please disregard.

## 2019-03-10 NOTE — Telephone Encounter (Signed)
Pt states he was told during his OV today that a Rx nasal spray was going to be sent in for him. I told him that the office note states he should try Flonase or Nasonex that is available OTC. Pt states he wants it sent in as a Rx to see if his insurance will pay for it.

## 2019-03-11 LAB — CBC
HCT: 41.4 % (ref 38.5–50.0)
Hemoglobin: 12.7 g/dL — ABNORMAL LOW (ref 13.2–17.1)
MCH: 24.9 pg — ABNORMAL LOW (ref 27.0–33.0)
MCHC: 30.7 g/dL — ABNORMAL LOW (ref 32.0–36.0)
MCV: 81 fL (ref 80.0–100.0)
Platelets: 188 10*3/uL (ref 140–400)
RBC: 5.11 10*6/uL (ref 4.20–5.80)
RDW: 15.6 % — ABNORMAL HIGH (ref 11.0–15.0)
WBC: 8.9 10*3/uL (ref 3.8–10.8)

## 2019-03-11 LAB — COMPLETE METABOLIC PANEL WITH GFR
AG Ratio: 1.4 (calc) (ref 1.0–2.5)
ALT: 11 U/L (ref 9–46)
AST: 15 U/L (ref 10–35)
Albumin: 3.9 g/dL (ref 3.6–5.1)
Alkaline phosphatase (APISO): 121 U/L (ref 35–144)
BUN/Creatinine Ratio: 11 (calc) (ref 6–22)
BUN: 15 mg/dL (ref 7–25)
CO2: 23 mmol/L (ref 20–32)
Calcium: 9.5 mg/dL (ref 8.6–10.3)
Chloride: 109 mmol/L (ref 98–110)
Creat: 1.33 mg/dL — ABNORMAL HIGH (ref 0.70–1.11)
GFR, Est African American: 58 mL/min/{1.73_m2} — ABNORMAL LOW (ref >=60)
GFR, Est Non African American: 50 mL/min/{1.73_m2} — ABNORMAL LOW (ref >=60)
Globulin: 2.8 g/dL (calc) (ref 1.9–3.7)
Glucose, Bld: 102 mg/dL — ABNORMAL HIGH (ref 65–99)
Potassium: 4.5 mmol/L (ref 3.5–5.3)
Sodium: 141 mmol/L (ref 135–146)
Total Bilirubin: 0.7 mg/dL (ref 0.2–1.2)
Total Protein: 6.7 g/dL (ref 6.1–8.1)

## 2019-03-11 LAB — LIPID PANEL
Cholesterol: 114 mg/dL (ref <200)
HDL: 42 mg/dL (ref >=40)
LDL Cholesterol (Calc): 58 mg/dL (calc)
Non-HDL Cholesterol (Calc): 72 mg/dL (calc) (ref <130)
Total CHOL/HDL Ratio: 2.7 (calc) (ref <5.0)
Triglycerides: 64 mg/dL (ref <150)

## 2019-03-22 DIAGNOSIS — E1142 Type 2 diabetes mellitus with diabetic polyneuropathy: Secondary | ICD-10-CM | POA: Diagnosis not present

## 2019-03-22 DIAGNOSIS — B351 Tinea unguium: Secondary | ICD-10-CM | POA: Diagnosis not present

## 2019-03-25 DIAGNOSIS — Z23 Encounter for immunization: Secondary | ICD-10-CM | POA: Diagnosis not present

## 2019-04-14 ENCOUNTER — Other Ambulatory Visit: Payer: Self-pay

## 2019-04-14 ENCOUNTER — Ambulatory Visit (INDEPENDENT_AMBULATORY_CARE_PROVIDER_SITE_OTHER): Payer: Medicare Other | Admitting: Sports Medicine

## 2019-04-14 ENCOUNTER — Encounter: Payer: Self-pay | Admitting: Sports Medicine

## 2019-04-14 DIAGNOSIS — M170 Bilateral primary osteoarthritis of knee: Secondary | ICD-10-CM

## 2019-04-14 MED ORDER — TRAMADOL HCL 50 MG PO TABS: 50.0000 mg | ORAL_TABLET | Freq: Three times a day (TID) | ORAL | 1 refills | Status: DC | PRN

## 2019-04-14 NOTE — Progress Notes (Signed)
    Procedures performed today:    None.  Independent interpretation of notes and tests performed by another provider:   None.  Impression and Recommendations:    Primary osteoarthritis of both knees Damon Acosta did extremely well after Orthovisc done approximately a year ago. He is having recurrence of pain. Adding tramadol, I like to see him back 1 month after his second COVID-19 injection and we can do steroid injections and start Orthovisc as well, it looks like he has red-white and blue Medicare so we should not need prior approval.    ___________________________________________ Gwen Her. Dianah Field, M.D., ABFM., CAQSM. Primary Care and [place] Instructor of [place] of [facility] of Medicine

## 2019-04-14 NOTE — Assessment & Plan Note (Addendum)
Damon Acosta did extremely well after Orthovisc done approximately a year ago. He is having recurrence of pain. Adding tramadol, I like to see him back 1 month after his second COVID-19 injection and we can do steroid injections and start Orthovisc as well, it looks like he has red-white and blue Medicare so we should not need prior approval.

## 2019-04-19 ENCOUNTER — Other Ambulatory Visit: Payer: Self-pay

## 2019-04-19 DIAGNOSIS — E1122 Type 2 diabetes mellitus with diabetic chronic kidney disease: Secondary | ICD-10-CM

## 2019-04-19 MED ORDER — POTASSIUM CHLORIDE ER 10 MEQ PO CPCR: 10.0000 meq | ORAL_CAPSULE | Freq: Every day | ORAL | 0 refills | Status: DC

## 2019-04-19 MED ORDER — LINAGLIPTIN 5 MG PO TABS: 5.0000 mg | ORAL_TABLET | Freq: Every day | ORAL | 0 refills | Status: DC

## 2019-04-22 ENCOUNTER — Other Ambulatory Visit: Payer: Self-pay

## 2019-04-22 DIAGNOSIS — Z8673 Personal history of transient ischemic attack (TIA), and cerebral infarction without residual deficits: Secondary | ICD-10-CM

## 2019-04-22 DIAGNOSIS — E1169 Type 2 diabetes mellitus with other specified complication: Secondary | ICD-10-CM

## 2019-04-22 MED ORDER — ATORVASTATIN CALCIUM 20 MG PO TABS: 20.0000 mg | ORAL_TABLET | Freq: Every day | ORAL | 0 refills | Status: DC

## 2019-04-23 DIAGNOSIS — Z23 Encounter for immunization: Secondary | ICD-10-CM | POA: Diagnosis not present

## 2019-04-27 DIAGNOSIS — Z03818 Encounter for observation for suspected exposure to other biological agents ruled out: Secondary | ICD-10-CM | POA: Diagnosis not present

## 2019-04-27 DIAGNOSIS — Z20828 Contact with and (suspected) exposure to other viral communicable diseases: Secondary | ICD-10-CM | POA: Diagnosis not present

## 2019-05-26 ENCOUNTER — Other Ambulatory Visit: Payer: Self-pay | Admitting: Osteopathic Medicine

## 2019-05-26 ENCOUNTER — Other Ambulatory Visit: Payer: Self-pay

## 2019-05-26 ENCOUNTER — Ambulatory Visit (INDEPENDENT_AMBULATORY_CARE_PROVIDER_SITE_OTHER): Payer: Medicare Other | Admitting: Sports Medicine

## 2019-05-26 DIAGNOSIS — M170 Bilateral primary osteoarthritis of knee: Secondary | ICD-10-CM

## 2019-05-26 DIAGNOSIS — M48062 Spinal stenosis, lumbar region with neurogenic claudication: Secondary | ICD-10-CM

## 2019-05-26 NOTE — Assessment & Plan Note (Signed)
Koichi returns, his spine is comanaged with Dr. Lynann Bologna, the initial plan was an L2 to pelvic fusion extending his prior L4-L5 fusion if epidurals failed but he initially had a good response to a right L2-L3 transforaminal epidural. His last epidural was approximately 7 months ago, so we are going to proceed with another epidural, right L2-L3 transforaminal, we can discuss his response at a future visit.

## 2019-05-26 NOTE — Assessment & Plan Note (Signed)
Savonte returns, he did extremely well after Orthovisc about a year ago, today we are starting the series again, we did steroid and Orthovisc injections into both knees, return in 1 week for Orthovisc No. 2 of 4 into both knees. He does have red-white and blue Medicare.

## 2019-05-26 NOTE — Progress Notes (Signed)
    Procedures performed today:    Procedure: Real-time Ultrasound Guided injection of the left knee Device: Samsung HS60  Verbal informed consent obtained.  Time-out conducted.  Noted no overlying erythema, induration, or other signs of local infection.  Skin prepped in a sterile fashion.  Local anesthesia: Topical Ethyl chloride.  With sterile technique and under real time ultrasound guidance:  30 mg/2 mL of OrthoVisc (sodium hyaluronate) in a prefilled syringe was injected easily into the knee through a 22-gauge needle, syringe switched and 1 cc Kenalog 40, 2 cc lidocaine, 2 cc bupivacaine injected easily Completed without difficulty  Pain immediately resolved suggesting accurate placement of the medication.  Advised to call if fevers/chills, erythema, induration, drainage, or persistent bleeding.  Images permanently stored and available for review in the ultrasound unit.  Impression: Technically successful ultrasound guided injection.  Procedure: Real-time Ultrasound Guided injection of the right knee Device: Samsung HS60  Verbal informed consent obtained.  Time-out conducted.  Noted no overlying erythema, induration, or other signs of local infection.  Skin prepped in a sterile fashion.  Local anesthesia: Topical Ethyl chloride.  With sterile technique and under real time ultrasound guidance:  30 mg/2 mL of OrthoVisc (sodium hyaluronate) in a prefilled syringe was injected easily into the knee through a 22-gauge needle, syringe switched and 1 cc Kenalog 40, 2 cc lidocaine, 2 cc bupivacaine injected easily Completed without difficulty  Pain immediately resolved suggesting accurate placement of the medication.  Advised to call if fevers/chills, erythema, induration, drainage, or persistent bleeding.  Images permanently stored and available for review in the ultrasound unit.  Impression: Technically successful ultrasound guided injection.  Independent interpretation of notes and  tests performed by another provider:   None.  Brief History, Exam, Impression, and Recommendations:    Primary osteoarthritis of both knees Anquan returns, he did extremely well after Orthovisc about a year ago, today we are starting the series again, we did steroid and Orthovisc injections into both knees, return in 1 week for Orthovisc No. 2 of 4 into both knees. He does have red-white and blue Medicare.  Lumbar spinal stenosis Kaiea returns, his spine is comanaged with Dr. Lynann Bologna, the initial plan was an L2 to pelvic fusion extending his prior L4-L5 fusion if epidurals failed but he initially had a good response to a right L2-L3 transforaminal epidural. His last epidural was approximately 7 months ago, so we are going to proceed with another epidural, right L2-L3 transforaminal, we can discuss his response at a future visit.    ___________________________________________ Gwen Her. Dianah Field, M.D., ABFM., CAQSM. Primary Care and [place] Instructor of [place] of [facility] of Medicine

## 2019-05-27 ENCOUNTER — Other Ambulatory Visit: Payer: Self-pay | Admitting: Sports Medicine

## 2019-05-27 DIAGNOSIS — M48062 Spinal stenosis, lumbar region with neurogenic claudication: Secondary | ICD-10-CM

## 2019-06-02 ENCOUNTER — Ambulatory Visit (INDEPENDENT_AMBULATORY_CARE_PROVIDER_SITE_OTHER): Payer: Medicare Other | Admitting: Sports Medicine

## 2019-06-02 ENCOUNTER — Encounter: Payer: Self-pay | Admitting: Sports Medicine

## 2019-06-02 ENCOUNTER — Other Ambulatory Visit: Payer: Self-pay

## 2019-06-02 DIAGNOSIS — M170 Bilateral primary osteoarthritis of knee: Secondary | ICD-10-CM | POA: Diagnosis not present

## 2019-06-02 NOTE — Assessment & Plan Note (Signed)
Orthovisc No. 2 of 4 into both knees, return in 1 week for #3 of 4. 

## 2019-06-02 NOTE — Progress Notes (Signed)
    Procedures performed today:   ° °Procedure: Real-time Ultrasound Guided injection of the left knee °Device: Samsung HS60  °Verbal informed consent obtained.  °Time-out conducted.  °Noted no overlying erythema, induration, or other signs of local infection.  °Skin prepped in a sterile fashion.  °Local anesthesia: Topical Ethyl chloride.  °With sterile technique and under real time ultrasound guidance:  30 mg/2 mL of OrthoVisc (sodium hyaluronate) in a prefilled syringe was injected easily into the knee through a 22-gauge needle.  Completed without difficulty  °Pain immediately resolved suggesting accurate placement of the medication.  °Advised to call if fevers/chills, erythema, induration, drainage, or persistent bleeding.  °Images permanently stored and available for review in the ultrasound unit.  °Impression: Technically successful ultrasound guided injection. °  °Procedure: Real-time Ultrasound Guided injection of the right knee °Device: Samsung HS60  °Verbal informed consent obtained.  °Time-out conducted.  °Noted no overlying erythema, induration, or other signs of local infection.  °Skin prepped in a sterile fashion.  °Local anesthesia: Topical Ethyl chloride.  °With sterile technique and under real time ultrasound guidance:  30 mg/2 mL of OrthoVisc (sodium hyaluronate) in a prefilled syringe was injected easily into the knee through a 22-gauge needle.  Completed without difficulty  °Pain immediately resolved suggesting accurate placement of the medication.  °Advised to call if fevers/chills, erythema, induration, drainage, or persistent bleeding.  °Images permanently stored and available for review in the ultrasound unit.  °Impression: Technically successful ultrasound guided injection. ° °Independent interpretation of notes and tests performed by another provider:  ° °None. ° °Brief History, Exam, Impression, and Recommendations:   ° °Primary osteoarthritis of both knees °Orthovisc No. 2 of 4 into  both knees, return in 1 week for #3 of 4. ° ° ° °___________________________________________ °Samya Siciliano J. Colleene Swarthout, M.D., ABFM., CAQSM. °Primary Care and Sports Medicine °[facility] ° °Adjunct Instructor of Family Medicine  °[employer] °

## 2019-06-07 ENCOUNTER — Other Ambulatory Visit: Payer: Self-pay | Admitting: Osteopathic Medicine

## 2019-06-07 DIAGNOSIS — E1159 Type 2 diabetes mellitus with other circulatory complications: Secondary | ICD-10-CM

## 2019-06-07 DIAGNOSIS — I152 Hypertension secondary to endocrine disorders: Secondary | ICD-10-CM

## 2019-06-09 ENCOUNTER — Other Ambulatory Visit: Payer: Self-pay

## 2019-06-09 ENCOUNTER — Ambulatory Visit (INDEPENDENT_AMBULATORY_CARE_PROVIDER_SITE_OTHER): Payer: Medicare Other | Admitting: Sports Medicine

## 2019-06-09 DIAGNOSIS — M170 Bilateral primary osteoarthritis of knee: Secondary | ICD-10-CM | POA: Diagnosis not present

## 2019-06-09 NOTE — Progress Notes (Signed)
    Procedures performed today:    Procedure: Real-time Ultrasound Guidedinjection of the left knee Device: Samsung HS60  Verbal informed consent obtained.  Time-out conducted.  Noted no overlying erythema, induration, or other signs of local infection.  Skin prepped in a sterile fashion.  Local anesthesia: Topical Ethyl chloride.  With sterile technique and under real time ultrasound guidance: 30 mg/2 mL of OrthoVisc (sodium hyaluronate) in a prefilled syringe was injected easily into the knee through a 22-gauge needle.   Completed without difficulty  Pain immediately resolved suggesting accurate placement of the medication.  Advised to call if fevers/chills, erythema, induration, drainage, or persistent bleeding.  Images permanently stored and available for review in the ultrasound unit.  Impression: Technically successful ultrasound guided injection.  Procedure: Real-time Ultrasound Guidedinjection of the right knee Device: Samsung HS60  Verbal informed consent obtained.  Time-out conducted.  Noted no overlying erythema, induration, or other signs of local infection.  Skin prepped in a sterile fashion.  Local anesthesia: Topical Ethyl chloride.  With sterile technique and under real time ultrasound guidance: 30 mg/2 mL of OrthoVisc (sodium hyaluronate) in a prefilled syringe was injected easily into the knee through a 22-gauge needle.   Completed without difficulty  Pain immediately resolved suggesting accurate placement of the medication.  Advised to call if fevers/chills, erythema, induration, drainage, or persistent bleeding.  Images permanently stored and available for review in the ultrasound unit.  Impression: Technically successful ultrasound guided injection.  Independent interpretation of notes and tests performed by another provider:   None.  Brief History, Exam, Impression, and Recommendations:    Primary osteoarthritis of both knees Orthovisc No. 3 of 4 into  both knees, return in 1 week for Orthovisc No. 4 of 4. Knees are doing well.    ___________________________________________ Gwen Her. Dianah Field, M.D., ABFM., CAQSM. Primary Care and [place] Instructor of [place] of [facility] of Medicine

## 2019-06-09 NOTE — Assessment & Plan Note (Signed)
Orthovisc No. 3 of 4 into both knees, return in 1 week for Orthovisc No. 4 of 4. Knees are doing well.

## 2019-06-10 ENCOUNTER — Ambulatory Visit
Admission: RE | Admit: 2019-06-10 | Discharge: 2019-06-10 | Disposition: A | Discharge status: Home / Self Care | Payer: Medicare Other | Source: Ambulatory Visit | Attending: Sports Medicine | Admitting: Sports Medicine

## 2019-06-10 ENCOUNTER — Other Ambulatory Visit: Payer: Medicare Other

## 2019-06-10 DIAGNOSIS — M48062 Spinal stenosis, lumbar region with neurogenic claudication: Secondary | ICD-10-CM

## 2019-06-10 DIAGNOSIS — M5136 Other intervertebral disc degeneration, lumbar region: Secondary | ICD-10-CM | POA: Diagnosis not present

## 2019-06-10 MED ORDER — IOPAMIDOL (ISOVUE-M 200) INJECTION 41%
1.0000 mL | Freq: Once | INTRAMUSCULAR | Status: AC
  Administered 2019-06-10: 1 mL via EPIDURAL

## 2019-06-10 MED ORDER — METHYLPREDNISOLONE ACETATE 40 MG/ML INJ SUSP (RADIOLOG
120.0000 mg | Freq: Once | INTRAMUSCULAR | Status: AC
  Administered 2019-06-10: 120 mg via EPIDURAL

## 2019-06-10 NOTE — Discharge Instructions (Signed)

## 2019-06-16 ENCOUNTER — Other Ambulatory Visit: Payer: Self-pay

## 2019-06-16 ENCOUNTER — Ambulatory Visit (INDEPENDENT_AMBULATORY_CARE_PROVIDER_SITE_OTHER): Payer: Medicare Other | Admitting: Sports Medicine

## 2019-06-16 DIAGNOSIS — M170 Bilateral primary osteoarthritis of knee: Secondary | ICD-10-CM | POA: Diagnosis not present

## 2019-06-16 NOTE — Progress Notes (Signed)
    Procedures performed today:    Procedure: Real-time Ultrasound Guidedinjection of theleft knee Device: Samsung HS60  Verbal informed consent obtained.  Time-out conducted.  Noted no overlying erythema, induration, or other signs of local infection.  Skin prepped in a sterile fashion.  Local anesthesia: Topical Ethyl chloride.  With sterile technique and under real time ultrasound guidance:30 mg/2 mL of OrthoVisc (sodium hyaluronate) in a prefilled syringe was injected easily into the knee through a 22-gauge needle.  Completed without difficulty  Pain immediately resolved suggesting accurate placement of the medication.  Advised to call if fevers/chills, erythema, induration, drainage, or persistent bleeding.  Images permanently stored and available for review in the ultrasound unit.  Impression: Technically successful ultrasound guided injection.  Procedure: Real-time Ultrasound Guidedinjection of therightknee Device: Samsung HS60  Verbal informed consent obtained.  Time-out conducted.  Noted no overlying erythema, induration, or other signs of local infection.  Skin prepped in a sterile fashion.  Local anesthesia: Topical Ethyl chloride.  With sterile technique and under real time ultrasound guidance:30 mg/2 mL of OrthoVisc (sodium hyaluronate) in a prefilled syringe was injected easily into the knee through a 22-gauge needle.  Completed without difficulty  Pain immediately resolved suggesting accurate placement of the medication.  Advised to call if fevers/chills, erythema, induration, drainage, or persistent bleeding.  Images permanently stored and available for review in the ultrasound unit.  Impression: Technically successful ultrasound guided injection.  Independent interpretation of notes and tests performed by another provider:   None.  Brief History, Exam, Impression, and Recommendations:    Primary osteoarthritis of both knees Damon Acosta returns, today we  did Orthovisc No. 4 of 4 into both knees.    ___________________________________________ Gwen Her. Dianah Field, M.D., ABFM., CAQSM. Primary Care and [place] Instructor of [place] of [facility] of Medicine

## 2019-06-16 NOTE — Assessment & Plan Note (Signed)
Azlaan returns, today we did Orthovisc No. 4 of 4 into both knees.

## 2019-07-12 ENCOUNTER — Ambulatory Visit: Payer: Medicare Other | Admitting: Osteopathic Medicine

## 2019-07-18 ENCOUNTER — Encounter: Payer: Self-pay | Admitting: Osteopathic Medicine

## 2019-07-18 ENCOUNTER — Ambulatory Visit (INDEPENDENT_AMBULATORY_CARE_PROVIDER_SITE_OTHER): Payer: Medicare Other | Admitting: Osteopathic Medicine

## 2019-07-18 VITALS — BP 115/72 | HR 99 | Temp 98.1°F | Wt 203.1 lb

## 2019-07-18 DIAGNOSIS — N1831 Chronic kidney disease, stage 3a: Secondary | ICD-10-CM | POA: Diagnosis not present

## 2019-07-18 DIAGNOSIS — E1122 Type 2 diabetes mellitus with diabetic chronic kidney disease: Secondary | ICD-10-CM

## 2019-07-18 DIAGNOSIS — Z933 Colostomy status: Secondary | ICD-10-CM

## 2019-07-18 DIAGNOSIS — E1121 Type 2 diabetes mellitus with diabetic nephropathy: Secondary | ICD-10-CM | POA: Diagnosis not present

## 2019-07-18 DIAGNOSIS — R197 Diarrhea, unspecified: Secondary | ICD-10-CM | POA: Diagnosis not present

## 2019-07-18 DIAGNOSIS — K529 Noninfective gastroenteritis and colitis, unspecified: Secondary | ICD-10-CM

## 2019-07-18 LAB — POCT GLYCOSYLATED HEMOGLOBIN (HGB A1C): Hemoglobin A1C: 6.2 % — AB (ref 4.0–5.6)

## 2019-07-18 NOTE — Progress Notes (Signed)
Damon Acosta is a 79 y.o. male who presents to  [place] at [facility]  today, 07/18/19, seeking care for the following:  The encounter diagnosis was Type 2 diabetes mellitus with stage 3a chronic kidney disease, without long-term current use of insulin ([place]).    ASSESSMENT & PLAN with other pertinent history/findings:  Type 2 diabetes mellitus with stage 3a chronic kidney disease, without long-term current use of insulin ([name])  DIABETES SCREENING/PREVENTIVE CARE: A1C past 3-6 mos: Yes  controlled? Yes   07/27/2018: 6.0  12/08/18: 6.3  03/10/19: 6.0  Today 07/18/19: 6.2  BP goal <130/80:        BP Readings  07/18/19  115/72  03/10/19  146/69  12/08/18 131/79  11/05/18 (!) 141/94  10/26/18 105/60   LDL goal <70: was 58 in 02/2019   Eye exam annually: done 2019, importance discussed with patient Foot exam: Yes    Microalbuminuria:n/a on ARB  CKD3 stable   Metformin: No  ACE/ARB: Yes  Antiplatelet if ASCVD Risk >10%: on Plavix Statin: Yes but lower potency - ok for age   Pneumovax: Yes 2019    Colostomy, diarrhea Patient reports about 3 weeks of dark green liquid stool from colostomy and from rectum.  Unsure of any antibiotics within the past 3 months.  No abdominal pain, fever, weight change.  Stool studies as below.  Follow-up based on results  Orders Placed This Encounter  Procedures  . Stool Culture  . Stool, WBC/Lactoferrin  . C. difficile GDH and Toxin A/B  . POCT HgB A1C     Spinal stenosis of lumbar region with neurogenic claudication Primary osteoarthritis of both knees Follow w/ sports med / ortho as directed  History of gout continue allopurinol   History of CVA (cerebrovascular accident) Plavix, statin   Dyslipidemia associated with type 2 diabetes mellitus ([place]) Lipids looking good!   Hypertension associated with diabetes ([place]) Pt reports home BP 130/80 or less  BP Readings  from Last 3 Encounters:  07/18/19 115/72  06/10/19 123/72  03/10/19 (!) 146/69          Orders Placed This Encounter  Procedures  . POCT HgB A1C    No orders of the defined types were placed in this encounter.   There are no Patient Instructions on file for this visit.    Follow-up instructions: Return in about 4 months (around 11/17/2019) for monitor A1C, otherwise depending on stool results! .        There were no vitals taken for this visit.  Current Meds  Medication Sig  . acetaminophen (TYLENOL) 650 MG CR tablet Take 1-2 tablets (650-1,300 mg total) by mouth every 8 (eight) hours as needed for pain.  Marland Kitchen allopurinol (ZYLOPRIM) 300 MG tablet Take 1 tablet (300 mg total) by mouth daily.  . AMBULATORY NON FORMULARY MEDICATION Blood Pressure Monitor- use to check blood pressures 1-2 times daily  . amLODipine (NORVASC) 2.5 MG tablet Take 1 tablet (2.5 mg total) by mouth daily.  Marland Kitchen atorvastatin (LIPITOR) 20 MG tablet Take 1 tablet (20 mg total) by mouth daily at 6 PM.  . blood glucose meter kit and supplies KIT Dispense based on patient and insurance preference. Use up to four times daily as directed. Please include lancets, test strips, control solution.  . clopidogrel (PLAVIX) 75 MG tablet Take 1 tablet (75 mg total) by mouth daily.  . fluticasone (FLONASE) 50 MCG/ACT nasal spray Place 2 sprays into both nostrils daily.  Marland Kitchen  irbesartan (AVAPRO) 75 MG tablet Take 1 tablet (75 mg total) by mouth daily.  Marland Kitchen linagliptin (TRADJENTA) 5 MG TABS tablet Take 1 tablet (5 mg total) by mouth daily.  . metoprolol tartrate (LOPRESSOR) 25 MG tablet TAKE 1 TABLET BY MOUTH TWICE A DAY  . nitroGLYCERIN (NITROSTAT) 0.6 MG SL tablet Place 0.6 mg under the tongue every 5 (five) minutes as needed for chest pain.  Marland Kitchen omeprazole (PRILOSEC) 20 MG capsule Take 1 capsule (20 mg total) by mouth 2 (two) times daily.  Marland Kitchen oxybutynin (DITROPAN-XL) 10 MG 24 hr tablet Take 1 tablet (10 mg total) by mouth  daily.  . potassium chloride (MICRO-K) 10 MEQ CR capsule Take 1 capsule (10 mEq total) by mouth daily.  . traMADol (ULTRAM) 50 MG tablet Take 1-2 tablets (50-100 mg total) by mouth every 8 (eight) hours as needed for moderate pain. Maximum 6 tabs per day.    No results found for this or any previous visit (from the past 72 hour(s)).  No results found.   All questions at time of visit were answered - patient instructed to contact office with any additional concerns or updates.  ER/RTC precautions were reviewed with the patient.  Please note: voice recognition software was used to produce this document, and typos may escape review. Please contact Dr. Sheppard Coil for any needed clarifications.

## 2019-07-18 NOTE — Patient Instructions (Signed)
Plan: Stool testing - lab can tell you how to bring back a specimen or collect one here - let them know you have a colostomy bag!

## 2019-07-25 LAB — C. DIFFICILE GDH AND TOXIN A/B
GDH ANTIGEN: NOT DETECTED
MICRO NUMBER:: 10578785
SPECIMEN QUALITY:: ADEQUATE
TOXIN A AND B: NOT DETECTED

## 2019-07-25 LAB — STOOL CULTURE
MICRO NUMBER:: 10577704
MICRO NUMBER:: 10577705
MICRO NUMBER:: 10577706
SHIGA RESULT:: NOT DETECTED
SPECIMEN QUALITY:: ADEQUATE
SPECIMEN QUALITY:: ADEQUATE
SPECIMEN QUALITY:: ADEQUATE

## 2019-07-25 LAB — FECAL LACTOFERRIN, QUANT
Fecal Lactoferrin: NEGATIVE
MICRO NUMBER:: 10578783
SPECIMEN QUALITY:: ADEQUATE

## 2019-07-25 NOTE — Addendum Note (Signed)
Addended by: Maryla Morrow on: 07/25/2019 08:23 AM   Modules accepted: Orders

## 2019-08-12 ENCOUNTER — Ambulatory Visit (INDEPENDENT_AMBULATORY_CARE_PROVIDER_SITE_OTHER): Payer: Medicare Other | Admitting: Osteopathic Medicine

## 2019-08-12 ENCOUNTER — Encounter: Payer: Self-pay | Admitting: Osteopathic Medicine

## 2019-08-12 ENCOUNTER — Other Ambulatory Visit: Payer: Self-pay

## 2019-08-12 VITALS — BP 128/83 | HR 86 | Wt 202.0 lb

## 2019-08-12 DIAGNOSIS — I152 Hypertension secondary to endocrine disorders: Secondary | ICD-10-CM

## 2019-08-12 DIAGNOSIS — N1831 Chronic kidney disease, stage 3a: Secondary | ICD-10-CM

## 2019-08-12 DIAGNOSIS — I10 Essential (primary) hypertension: Secondary | ICD-10-CM

## 2019-08-12 DIAGNOSIS — R413 Other amnesia: Secondary | ICD-10-CM | POA: Diagnosis not present

## 2019-08-12 DIAGNOSIS — E1122 Type 2 diabetes mellitus with diabetic chronic kidney disease: Secondary | ICD-10-CM

## 2019-08-12 DIAGNOSIS — E1121 Type 2 diabetes mellitus with diabetic nephropathy: Secondary | ICD-10-CM

## 2019-08-12 DIAGNOSIS — R4586 Emotional lability: Secondary | ICD-10-CM | POA: Diagnosis not present

## 2019-08-12 DIAGNOSIS — E1159 Type 2 diabetes mellitus with other circulatory complications: Secondary | ICD-10-CM

## 2019-08-12 MED ORDER — FLUOXETINE HCL 20 MG/5ML PO SOLN: ORAL | 0 refills | Status: DC

## 2019-08-12 NOTE — Patient Instructions (Addendum)
Plan: Will start antidepressant medication to help mental health I think this will improve memory and overall function See below for facilities which can provide immediate mental health services Will work on finding alternate GI office to help with colostomy/other issues   For immediate mental health services:  [place], [address] [place], [place] [address], [facility], [address], [place], [place] [address], [phone]  Any emergency room  [employer] [place], [phone]

## 2019-08-12 NOTE — Progress Notes (Signed)
Damon Acosta is a 79 y.o. male who presents to  [place] at [facility]  today, 08/12/19, seeking care for the following:  Marland Kitchen Memory and mood changes, wife reports he will be fine and then will "lash out," she's noticing he has been more forgetful. Ongoing about 3 weeks. Pt frustrated w/ other medical conditions. Felt suicidal after fight with his daughter.      ASSESSMENT & PLAN with other pertinent findings:  The primary encounter diagnosis was Memory changes. Diagnoses of Mood changes, Type 2 diabetes mellitus with stage 3a chronic kidney disease, without long-term current use of insulin ([place]), and Hypertension associated with diabetes ([place]) were also pertinent to this visit.   He has good insight into his issues and wants help. Good family support. GI issues are also causing mental distress.    Patient Instructions  Plan: Will start antidepressant medication to help mental health I think this will improve memory and overall function See below for facilities which can provide immediate mental health services Will work on finding alternate GI office to help with colostomy/other issues   For immediate mental health services:  [place], [address], [place], [place] [address], [facility], [address] [place], [place] [address], [phone]  Any emergency room  National Suicide Prevention [place], [phone]          No orders of the defined types were placed in this encounter.  Liquid d/t pt concern that some pills are coming into his colostomy bag whole, concern for absorption   Meds ordered this encounter  Medications  . FLUoxetine (PROZAC) 20 MG/5ML solution    Sig: Take 2.5 mLs (10 mg total) by mouth daily for 7 days, THEN 5 mLs (20 mg total) daily.    Dispense:  360 mL    Refill:  0       Follow-up instructions: Return in about 1  week (around 08/19/2019).                                         BP 128/83 (BP Location: Left Arm, Patient Position: Sitting)   Pulse 86   Wt 202 lb (91.6 kg)   SpO2 97%   BMI 29.83 kg/m   No outpatient medications have been marked as taking for the 08/12/19 encounter (Office Visit) with Emeterio Reeve, DO.    No results found for this or any previous visit (from the past 72 hour(s)).  No results found.     All questions at time of visit were answered - patient instructed to contact office with any additional concerns or updates.  ER/RTC precautions were reviewed with the patient as applicable.   Please note: voice recognition software was used to produce this document, and typos may escape review. Please contact Dr. Sheppard Coil for any needed clarifications.   Total encounter time: 30 minutes.

## 2019-08-19 ENCOUNTER — Telehealth: Payer: Self-pay | Admitting: Osteopathic Medicine

## 2019-08-19 ENCOUNTER — Other Ambulatory Visit: Payer: Self-pay

## 2019-08-19 ENCOUNTER — Encounter: Payer: Self-pay | Admitting: Osteopathic Medicine

## 2019-08-19 ENCOUNTER — Ambulatory Visit (INDEPENDENT_AMBULATORY_CARE_PROVIDER_SITE_OTHER): Payer: Medicare Other | Admitting: Osteopathic Medicine

## 2019-08-19 VITALS — BP 145/76 | HR 78 | Wt 200.0 lb

## 2019-08-19 DIAGNOSIS — R4586 Emotional lability: Secondary | ICD-10-CM | POA: Diagnosis not present

## 2019-08-19 DIAGNOSIS — F332 Major depressive disorder, recurrent severe without psychotic features: Secondary | ICD-10-CM | POA: Diagnosis not present

## 2019-08-19 NOTE — Progress Notes (Signed)
Damon Acosta is a 79 y.o. male who presents to  [place] at [facility]  today, 08/19/19, seeking care for the following:  . Follow-up mental health. Seen last week for concern with new recurrence of significant depression. Hx MDD. We started Prozac, he's tolerating this well and noting some improvement in mood, wife agrees. He went up on dose today to Prozac 20 mg, no ill effects so far.      ASSESSMENT & PLAN with other pertinent findings:  The primary encounter diagnosis was Severe episode of recurrent major depressive disorder, without psychotic features - improved from previous week ([place]). A diagnosis of Mood changes was also pertinent to this visit.   Continue with current plan Message sent to staff to try to get him into GI ASAP  Crisis resources were reviewed w/ patient and wife  I do not have any concerns right now for his safety to himself. He reports passive death wish, occasional thoughts of hurting himself but says he would not act on these. Family is supportive and is present to supervise him.   Depression screen [facility] 2/9 08/19/2019 08/12/2019 02/23/2018  Decreased Interest _0 Down, Depressed, Hopeless _1 PHQ - 2 Score _2 Altered sleeping _3 Tired, decreased energy _4 Change in appetite _5 Feeling bad or failure about yourself  _6 Trouble concentrating _7 Moving slowly or fidgety/restless _8 Suicidal thoughts _9 PHQ-9 Score _10 Difficult doing work/chores - - Not difficult at all   GAD 7 : Generalized Anxiety Score 08/19/2019 08/12/2019 02/23/2018 01/26/2018  Nervous, Anxious, on Edge _11 Control/stop worrying _12 Worry too much - different things _13 Trouble relaxing _14 Restless _15 Easily annoyed or irritable _16 Afraid - awful might happen _17 Total GAD 7 Score _18 Anxiety Difficulty Somewhat difficult - Somewhat difficult Somewhat  difficult      Patient Instructions  [facility] phone: [phone]  Main office: [phone]  Will stay on this dose Will work on getting into Dr Reesa Chew for GI or someone else ASAP   For immediate mental health services if needed:  [place], [address], [place], [place] [address], [facility], [address] [place], [place] [address], [phone]  Any emergency room or [place], [phone]      No orders of the defined types were placed in this encounter.   No orders of the defined types were placed in this encounter.      Follow-up instructions: Return in about 3 weeks (around 09/09/2019) for FOLLOW-UP MENTAL HEALTH - SEE [name] SOONER IF NEEDED .                                         BP (!) 145/76 (BP Location: Right Arm, Patient Position: Sitting)   Pulse 78   Wt 200 lb (90.7 kg)   SpO2 99%   BMI 29.53 kg/m   Current Meds  Medication Sig  . acetaminophen (TYLENOL) 650 MG CR tablet  Take 1-2 tablets (650-1,300 mg total) by mouth every 8 (eight) hours as needed for pain.  Marland Kitchen allopurinol (ZYLOPRIM) 300 MG tablet Take 1 tablet (300 mg total) by mouth daily.  . AMBULATORY NON FORMULARY MEDICATION Blood Pressure Monitor- use to check blood pressures 1-2 times daily  . amLODipine (NORVASC) 2.5 MG tablet Take 1 tablet (2.5 mg total) by mouth daily.  Marland Kitchen atorvastatin (LIPITOR) 20 MG tablet Take 1 tablet (20 mg total) by mouth daily at 6 PM.  . blood glucose meter kit and supplies KIT Dispense based on patient and insurance preference. Use up to four times daily as directed. Please include lancets, test strips, control solution.  . clopidogrel (PLAVIX) 75 MG tablet Take 1 tablet (75 mg total) by mouth daily.  Marland Kitchen FLUoxetine (PROZAC) 20 MG/5ML solution Take 2.5 mLs (10 mg total) by mouth daily for 7 days, THEN 5 mLs (20 mg total)  daily.  . fluticasone (FLONASE) 50 MCG/ACT nasal spray Place 2 sprays into both nostrils daily.  . irbesartan (AVAPRO) 75 MG tablet Take 1 tablet (75 mg total) by mouth daily.  Marland Kitchen linagliptin (TRADJENTA) 5 MG TABS tablet Take 1 tablet (5 mg total) by mouth daily.  . metoprolol tartrate (LOPRESSOR) 25 MG tablet TAKE 1 TABLET BY MOUTH TWICE A DAY  . nitroGLYCERIN (NITROSTAT) 0.6 MG SL tablet Place 0.6 mg under the tongue every 5 (five) minutes as needed for chest pain.  Marland Kitchen omeprazole (PRILOSEC) 20 MG capsule Take 1 capsule (20 mg total) by mouth 2 (two) times daily.  Marland Kitchen oxybutynin (DITROPAN-XL) 10 MG 24 hr tablet Take 1 tablet (10 mg total) by mouth daily.  . potassium chloride (MICRO-K) 10 MEQ CR capsule Take 1 capsule (10 mEq total) by mouth daily.  . traMADol (ULTRAM) 50 MG tablet Take 1-2 tablets (50-100 mg total) by mouth every 8 (eight) hours as needed for moderate pain. Maximum 6 tabs per day.    No results found for this or any previous visit (from the past 72 hour(s)).  No results found.     All questions at time of visit were answered - patient instructed to contact office with any additional concerns or updates.  ER/RTC precautions were reviewed with the patient as applicable.   Please note: voice recognition software was used to produce this document, and typos may escape review. Please contact Dr. Sheppard Coil for any needed clarifications.   Total encounter time: 20 minutes.

## 2019-08-19 NOTE — Patient Instructions (Signed)
[facility] phone: [phone]  Main office: [phone]  Will stay on this dose Will work on getting into Dr Reesa Chew for GI or someone else ASAP   For immediate mental health services if needed:  [place], [address] [place], [place] [address], [facility], [address] [place], [place] [address], [phone]  Any emergency room or [place], [phone]

## 2019-08-19 NOTE — Telephone Encounter (Signed)
Can we see if we can move up his GI appt? [facility] offered him something in August but he declined it.... Can we call around locally and see if someone can see him sooner? Persistent diarrhea in colostomy and rectal leakage as well causing him significant mental distress. (Asn't sure who was doing referrals today, sent to triage, Jenny Reichmann and Delsa Sale)

## 2019-08-22 NOTE — Telephone Encounter (Signed)
Sending to [place], she did referrals on day this was sent and I believe also doing them today. Thanks!

## 2019-08-22 NOTE — Telephone Encounter (Signed)
Sent this referral to [facility] at Fax [phone] and Ph is [phone] and requested they schedule with patient for appt time. Hoping they can get this patient in sooner that [facility] offered August

## 2019-09-03 ENCOUNTER — Other Ambulatory Visit: Payer: Self-pay | Admitting: Osteopathic Medicine

## 2019-09-03 DIAGNOSIS — I152 Hypertension secondary to endocrine disorders: Secondary | ICD-10-CM

## 2019-09-03 DIAGNOSIS — F332 Major depressive disorder, recurrent severe without psychotic features: Secondary | ICD-10-CM

## 2019-09-03 DIAGNOSIS — E1159 Type 2 diabetes mellitus with other circulatory complications: Secondary | ICD-10-CM

## 2019-09-05 NOTE — Telephone Encounter (Signed)
Medication Refill Last Ov- 08/19/2019 Last refill-08/12/2019

## 2019-09-07 ENCOUNTER — Ambulatory Visit (INDEPENDENT_AMBULATORY_CARE_PROVIDER_SITE_OTHER): Payer: Medicare Other | Admitting: Sports Medicine

## 2019-09-07 DIAGNOSIS — M48062 Spinal stenosis, lumbar region with neurogenic claudication: Secondary | ICD-10-CM

## 2019-09-07 MED ORDER — PREDNISONE 50 MG PO TABS: ORAL_TABLET | ORAL | 0 refills | Status: DC

## 2019-09-07 NOTE — Progress Notes (Signed)
    Procedures performed today:    None.  Independent interpretation of notes and tests performed by another provider:   None.  Brief History, Exam, Impression, and Recommendations:    Lumbar spinal stenosis Damon Acosta returns, we were seeing his wife and he asked me to see him as well, his spine is currently comanaged with Dr. Lynann Bologna, the initial plan was an L2 to pelvic fusion extending his prior L4-L5 fusion due to severe spinal stenosis if epidurals failed, he did have initial good response to L2-L3 right-sided transforaminal epidurals, his last epidural was in April and he tells me it really did not provide much relief. At this point I do not think there is any further role for epidurals and he really needs to follow this up with Dr. Lynann Bologna for further consideration of extending his fusion. I am going to add 5 days of prednisone, he will see me back next week to see determine response, and if insufficient improvement we will discuss surgery versus trial of additional medications.    ___________________________________________ Gwen Her. Dianah Field, M.D., ABFM., CAQSM. Primary Care and [place] Instructor of [place] [employer] [facility] [employer]

## 2019-09-07 NOTE — Assessment & Plan Note (Signed)
Damon Acosta returns, we were seeing his wife and he asked me to see him as well, his spine is currently comanaged with Dr. Lynann Bologna, the initial plan was an L2 to pelvic fusion extending his prior L4-L5 fusion due to severe spinal stenosis if epidurals failed, he did have initial good response to L2-L3 right-sided transforaminal epidurals, his last epidural was in April and he tells me it really did not provide much relief. At this point I do not think there is any further role for epidurals and he really needs to follow this up with Dr. Lynann Bologna for further consideration of extending his fusion. I am going to add 5 days of prednisone, he will see me back next week to see determine response, and if insufficient improvement we will discuss surgery versus trial of additional medications.

## 2019-09-09 ENCOUNTER — Ambulatory Visit: Payer: Medicare Other | Admitting: Osteopathic Medicine

## 2019-09-13 ENCOUNTER — Other Ambulatory Visit: Payer: Self-pay | Admitting: Osteopathic Medicine

## 2019-09-13 DIAGNOSIS — I152 Hypertension secondary to endocrine disorders: Secondary | ICD-10-CM

## 2019-09-13 DIAGNOSIS — E1159 Type 2 diabetes mellitus with other circulatory complications: Secondary | ICD-10-CM

## 2019-09-14 ENCOUNTER — Ambulatory Visit: Payer: Medicare Other | Admitting: Sports Medicine

## 2019-09-15 ENCOUNTER — Emergency Department (INDEPENDENT_AMBULATORY_CARE_PROVIDER_SITE_OTHER)
Admission: EM | Admit: 2019-09-15 | Discharge: 2019-09-15 | Disposition: A | Discharge status: Home / Self Care | Payer: Medicare Other | Source: Home / Self Care

## 2019-09-15 ENCOUNTER — Other Ambulatory Visit: Payer: Self-pay

## 2019-09-15 ENCOUNTER — Encounter: Payer: Self-pay | Admitting: Emergency Medicine

## 2019-09-15 DIAGNOSIS — I080 Rheumatic disorders of both mitral and aortic valves: Secondary | ICD-10-CM | POA: Diagnosis not present

## 2019-09-15 DIAGNOSIS — F332 Major depressive disorder, recurrent severe without psychotic features: Secondary | ICD-10-CM | POA: Insufficient documentation

## 2019-09-15 DIAGNOSIS — F3341 Major depressive disorder, recurrent, in partial remission: Secondary | ICD-10-CM | POA: Insufficient documentation

## 2019-09-15 DIAGNOSIS — Z8673 Personal history of transient ischemic attack (TIA), and cerebral infarction without residual deficits: Secondary | ICD-10-CM | POA: Diagnosis not present

## 2019-09-15 DIAGNOSIS — I4892 Unspecified atrial flutter: Secondary | ICD-10-CM | POA: Diagnosis not present

## 2019-09-15 DIAGNOSIS — R079 Chest pain, unspecified: Secondary | ICD-10-CM | POA: Diagnosis not present

## 2019-09-15 DIAGNOSIS — R0602 Shortness of breath: Secondary | ICD-10-CM

## 2019-09-15 DIAGNOSIS — N179 Acute kidney failure, unspecified: Secondary | ICD-10-CM | POA: Diagnosis not present

## 2019-09-15 DIAGNOSIS — I4891 Unspecified atrial fibrillation: Secondary | ICD-10-CM

## 2019-09-15 DIAGNOSIS — R0789 Other chest pain: Secondary | ICD-10-CM | POA: Diagnosis not present

## 2019-09-15 DIAGNOSIS — R000 Tachycardia, unspecified: Secondary | ICD-10-CM | POA: Diagnosis not present

## 2019-09-15 DIAGNOSIS — R778 Other specified abnormalities of plasma proteins: Secondary | ICD-10-CM | POA: Diagnosis not present

## 2019-09-15 DIAGNOSIS — N1831 Chronic kidney disease, stage 3a: Secondary | ICD-10-CM | POA: Diagnosis not present

## 2019-09-15 DIAGNOSIS — I214 Non-ST elevation (NSTEMI) myocardial infarction: Secondary | ICD-10-CM | POA: Insufficient documentation

## 2019-09-15 DIAGNOSIS — I10 Essential (primary) hypertension: Secondary | ICD-10-CM | POA: Diagnosis not present

## 2019-09-15 DIAGNOSIS — I484 Atypical atrial flutter: Secondary | ICD-10-CM | POA: Diagnosis not present

## 2019-09-15 DIAGNOSIS — R0600 Dyspnea, unspecified: Secondary | ICD-10-CM | POA: Diagnosis not present

## 2019-09-15 DIAGNOSIS — I499 Cardiac arrhythmia, unspecified: Secondary | ICD-10-CM | POA: Diagnosis not present

## 2019-09-15 DIAGNOSIS — I25118 Atherosclerotic heart disease of native coronary artery with other forms of angina pectoris: Secondary | ICD-10-CM | POA: Insufficient documentation

## 2019-09-15 DIAGNOSIS — R531 Weakness: Secondary | ICD-10-CM

## 2019-09-15 DIAGNOSIS — R42 Dizziness and giddiness: Secondary | ICD-10-CM

## 2019-09-15 DIAGNOSIS — I444 Left anterior fascicular block: Secondary | ICD-10-CM | POA: Diagnosis not present

## 2019-09-15 DIAGNOSIS — R7989 Other specified abnormal findings of blood chemistry: Secondary | ICD-10-CM | POA: Diagnosis not present

## 2019-09-15 DIAGNOSIS — I517 Cardiomegaly: Secondary | ICD-10-CM | POA: Diagnosis not present

## 2019-09-15 DIAGNOSIS — R45851 Suicidal ideations: Secondary | ICD-10-CM | POA: Diagnosis not present

## 2019-09-15 DIAGNOSIS — E1122 Type 2 diabetes mellitus with diabetic chronic kidney disease: Secondary | ICD-10-CM | POA: Diagnosis not present

## 2019-09-15 NOTE — ED Triage Notes (Signed)
Patient here with spouse; uses walker for steadiness; reports 4 days of general aches and specifically across shoulders bilaterally, dizziness, vomiting day 1 and 2. Breathing rapidly. Took acetaminophen 30 minutes ago. Has had covid vaccinations.

## 2019-09-15 NOTE — ED Notes (Signed)
.  uctoed

## 2019-09-15 NOTE — ED Notes (Signed)
Call for EMS per Leeroy Cha.

## 2019-09-15 NOTE — ED Provider Notes (Signed)
Damon Ac[facility] CARE    CSN: [id number] Arrival date & time: 09/15/19  1219      History   Chief Complaint Chief Complaint  Patient presents with  . Generalized Body Aches  . Dizziness  . Shoulder Pain    HPI BRITON SELLMAN is a 79 y.o. male.   HPI KERRIGAN GLENDENING is a 79 y.o. male presenting to UC with his wife with c/o 4 days worsening generalized body aches, worse across his shoulders, dizziness, mild SOB, and vomited twice the first day symptoms started.  Denies fever, chills, cough, or congestion. He has received the Covid-19 vaccine.  Denies sick contacts or recent travel. Denies chest pain. Pt is on Plavix for prior strokes, no hx of afib that he recalls.    Past Medical History:  Diagnosis Date  . Anxiety   . Arthritis   . Cancer ([name])   . 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 Diabetes mellitus without complication ([name])   . Gout   . Hyperlipidemia   . Hypertension   . 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 Overactive bladder   . Sleep apnea   . Stroke [facility])     Patient Active Problem List   Diagnosis Date Noted  . Asymptomatic PVCs 07/27/2018  . Melena 04/27/2018  . CKD (chronic kidney disease) stage 3, GFR 30-59 ml/min 04/27/2018  . Mild cognitive impairment 04/20/2018  . Microcytic anemia 04/20/2018  . Primary osteoarthritis of both knees 02/17/2018  . Insomnia due to anxiety and fear 01/26/2018  . Corn of left foot, plantar fifth MTP 01/26/2018  . 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 Type 2 diabetes mellitus with chronic kidney disease, without long-term current use of insulin ([name]) 01/06/2018  . Hyperkalemia 01/06/2018  . Elevated serum creatinine 01/06/2018  . History of gout 01/06/2018  . Dyslipidemia associated with type 2 diabetes mellitus ([name]) 01/06/2018  . Dyspepsia 01/06/2018  . OAB (overactive bladder) 01/06/2018  . History of prostate cancer 01/06/2018  . Current moderate  episode of major depressive disorder without prior episode ([name]) 01/06/2018  . Anxiety about health 01/06/2018  . Lumbar spinal stenosis 12/15/2017  . Colostomy status ([name]) 12/15/2017  . Hyperlipidemia 12/15/2017  . Hypertension associated with diabetes ([name]) 12/15/2017  . Gout 12/15/2017  . History of CVA (cerebrovascular accident) 12/15/2017    Past Surgical History:  Procedure Laterality Date  . CHOLECYSTECTOMY    . COLOSTOMY    . PROSTATE SURGERY         Home Medications    Prior to Admission medications   Medication Sig Start Date End Date Taking? Authorizing Provider  FLUoxetine (PROZAC) 20 MG/5ML solution TAKE 2.5 MLS (10 MG TOTAL) BY MOUTH DAILY FOR 7 DAYS, THEN 5 MLS (20 MG TOTAL) DAILY. 09/05/19 10/12/19  Sunnie Nielsen, DO  acetaminophen (TYLENOL) 650 MG CR tablet Take 1-2 tablets (650-1,300 mg total) by mouth every 8 (eight) hours as needed for pain. 03/10/19   Sunnie Nielsen, DO  allopurinol (ZYLOPRIM) 300 MG tablet Take 1 tablet (300 mg total) by mouth daily. 03/10/19   Sunnie Nielsen, DO  AMBULATORY NON FORMULARY MEDICATION Blood Pressure Monitor- use to check blood pressures 1-2 times daily 08/24/18   Carlis Stable, PA-C  amLODipine (NORVASC) 2.5 MG tablet Take 1 tablet (2.5 mg total) by mouth daily. 12/08/18   Sunnie Nielsen, DO  atorvastatin (LIPITOR) 20 MG tablet Take 1 tablet (20 mg total) by mouth daily at 6 PM. 04/22/19   Lyn Hollingshead,  Natalie, DO  blood glucose meter kit and supplies KIT Dispense based on patient and insurance preference. Use up to four times daily as directed. Please include lancets, test strips, control solution. 03/10/19   Emeterio Reeve, DO  clopidogrel (PLAVIX) 75 MG tablet Take 1 tablet (75 mg total) by mouth daily. 03/10/19   Emeterio Reeve, DO  fluticasone [facility]) 50 MCG/ACT nasal spray Place 2 sprays into both nostrils daily. 03/10/19 03/09/20  Emeterio Reeve, DO  irbesartan (AVAPRO) 75 MG tablet Take 1  tablet (75 mg total) by mouth daily. 03/10/19   Emeterio Reeve, DO  linagliptin (TRADJENTA) 5 MG TABS tablet Take 1 tablet (5 mg total) by mouth daily. 04/19/19   Emeterio Reeve, DO  metoprolol tartrate (LOPRESSOR) 25 MG tablet TAKE 1 TABLET BY MOUTH TWICE A DAY 09/14/19   Emeterio Reeve, DO  nitroGLYCERIN (NITROSTAT) 0.6 MG SL tablet Place 0.6 mg under the tongue every 5 (five) minutes as needed for chest pain.    [provider]  omeprazole (PRILOSEC) 20 MG capsule Take 1 capsule (20 mg total) by mouth 2 (two) times daily. 03/10/19   Emeterio Reeve, DO  oxybutynin (DITROPAN-XL) 10 MG 24 hr tablet Take 1 tablet (10 mg total) by mouth daily. 12/08/18   Emeterio Reeve, DO  potassium chloride (MICRO-K) 10 MEQ CR capsule Take 1 capsule (10 mEq total) by mouth daily. 04/19/19   Emeterio Reeve, DO  predniSONE (DELTASONE) 50 MG tablet One tab PO daily for 5 days. 09/07/19   Silverio Decamp, MD  traMADol (ULTRAM) 50 MG tablet Take 1-2 tablets (50-100 mg total) by mouth every 8 (eight) hours as needed for moderate pain. Maximum 6 tabs per day. 04/14/19   Silverio Decamp, MD    Family History Family History  Family history unknown: Yes    Social History Social History   Tobacco Use  . Smoking status: Former Research scientist (life sciences)  . Smokeless tobacco: Never Used  Substance Use Topics  . Alcohol use: Not Currently  . Drug use: Never     Allergies   Aspirin, Shellfish allergy, Doxepin, and Sertraline   Review of Systems Review of Systems  Constitutional: Positive for fatigue. Negative for chills and fever.  HENT: Negative for congestion, ear pain, sore throat, trouble swallowing and voice change.   Respiratory: Positive for shortness of breath. Negative for cough.   Cardiovascular: Negative for chest pain and palpitations.  Gastrointestinal: Positive for vomiting. Negative for abdominal pain, diarrhea and nausea.  Musculoskeletal: Positive for back pain. Negative for  arthralgias and myalgias.  Skin: Negative for rash.  Neurological: Positive for weakness (generalized).  All other systems reviewed and are negative.    Physical Exam Triage Vital Signs ED Triage Vitals  Enc Vitals Group     BP 09/15/19 1253 102/61     Pulse Rate 09/15/19 1253 88     Resp 09/15/19 1253 (!) 26     Temp 09/15/19 1253 98 F (36.7 C)     Temp Source 09/15/19 1253 Oral     SpO2 09/15/19 1253 100 %     Weight 09/15/19 1254 188 lb (85.3 kg)     Height 09/15/19 1254 '5\' 9"'$  (1.753 m)     Head Circumference --      Peak Flow --      Pain Score 09/15/19 1254 8     Pain Loc --      Pain Edu? --      Excl. in GC? --    No  data found.  Updated Vital Signs BP 102/61 (BP Location: Right Arm)   Pulse 88   Temp 98 F (36.7 C) (Oral)   Resp (!) 26   Ht '5\' 9"'$  (1.753 m)   Wt 188 lb (85.3 kg)   SpO2 100%   BMI 27.76 kg/m   Visual Acuity Right Eye Distance:   Left Eye Distance:   Bilateral Distance:    Right Eye Near:   Left Eye Near:    Bilateral Near:     Physical Exam Vitals and nursing note reviewed.  Constitutional:      Appearance: Normal appearance. He is well-developed.     Comments: Sitting in exam chair, appears mildly SOB but is alert and cooperative during exam.  HENT:     Head: Normocephalic and atraumatic.     Right Ear: Tympanic membrane and ear canal normal.     Left Ear: Tympanic membrane and ear canal normal.     Nose: Nose normal.     Mouth/Throat:     Lips: Pink.     Mouth: Mucous membranes are moist.     Pharynx: Oropharynx is clear. Uvula midline.  Cardiovascular:     Rate and Rhythm: Normal rate. Rhythm irregular.  Pulmonary:     Effort: Pulmonary effort is normal. Tachypnea present.     Breath sounds: Normal breath sounds.  Musculoskeletal:        General: Normal range of motion.     Cervical back: Normal range of motion.  Skin:    General: Skin is warm and dry.  Neurological:     Mental Status: He is alert and oriented to  person, place, and time.  Psychiatric:        Behavior: Behavior normal.      UC Treatments / Results  Labs (all labs ordered are listed, but only abnormal results are displayed) Labs Reviewed - No data to display  EKG Date/Time:09/15/2019   13:14:58 Ventricular Rate: 104 PR Interval: * QRS Duration: 68 QT Interval: 332 QTC Calculation: 436 P-R-T Axes: *   29   109 Text Interpretation: Atrial fibrillation with rapid ventricular response with premature ventricular or aberrantly conducted complexes.  Low voltage QRS. Nonspecific ST and T wave abnormality.  Abnormal ECG    Radiology No results found.  Procedures Procedures (including critical care time)  Medications Ordered in UC Medications - No data to display  Initial Impression / Assessment and Plan / UC Course  I have reviewed the triage vital signs and the nursing notes.  Pertinent labs & imaging results that were available during my care of the patient were reviewed by me and considered in my medical decision making (see chart for details).     Pt denies hx of afib Pt and wife agreeable for EMS to transport him to the hospital for further evaluation and treatment of his afib with RVR Pt discharged under the care of EMS in stable condition.   Final Clinical Impressions(s) / UC Diagnoses   Final diagnoses:  Atrial fibrillation, unspecified type ([name])  Dizziness  Shortness of breath  Generalized weakness   Discharge Instructions   None    ED Prescriptions    None     PDMP not reviewed this encounter.   Noe Gens, [place] 09/15/19 [phone]

## 2019-09-16 DIAGNOSIS — M25511 Pain in right shoulder: Secondary | ICD-10-CM | POA: Diagnosis present

## 2019-09-16 DIAGNOSIS — R45851 Suicidal ideations: Secondary | ICD-10-CM | POA: Diagnosis present

## 2019-09-16 DIAGNOSIS — R195 Other fecal abnormalities: Secondary | ICD-10-CM | POA: Diagnosis not present

## 2019-09-16 DIAGNOSIS — R7989 Other specified abnormal findings of blood chemistry: Secondary | ICD-10-CM | POA: Diagnosis present

## 2019-09-16 DIAGNOSIS — M109 Gout, unspecified: Secondary | ICD-10-CM | POA: Diagnosis present

## 2019-09-16 DIAGNOSIS — I480 Paroxysmal atrial fibrillation: Secondary | ICD-10-CM | POA: Insufficient documentation

## 2019-09-16 DIAGNOSIS — I4892 Unspecified atrial flutter: Secondary | ICD-10-CM | POA: Diagnosis not present

## 2019-09-16 DIAGNOSIS — Z8546 Personal history of malignant neoplasm of prostate: Secondary | ICD-10-CM | POA: Diagnosis not present

## 2019-09-16 DIAGNOSIS — G4730 Sleep apnea, unspecified: Secondary | ICD-10-CM | POA: Diagnosis present

## 2019-09-16 DIAGNOSIS — N179 Acute kidney failure, unspecified: Secondary | ICD-10-CM | POA: Diagnosis not present

## 2019-09-16 DIAGNOSIS — I444 Left anterior fascicular block: Secondary | ICD-10-CM | POA: Diagnosis not present

## 2019-09-16 DIAGNOSIS — I2510 Atherosclerotic heart disease of native coronary artery without angina pectoris: Secondary | ICD-10-CM | POA: Diagnosis not present

## 2019-09-16 DIAGNOSIS — F064 Anxiety disorder due to known physiological condition: Secondary | ICD-10-CM | POA: Diagnosis present

## 2019-09-16 DIAGNOSIS — I10 Essential (primary) hypertension: Secondary | ICD-10-CM | POA: Diagnosis not present

## 2019-09-16 DIAGNOSIS — M1990 Unspecified osteoarthritis, unspecified site: Secondary | ICD-10-CM | POA: Diagnosis present

## 2019-09-16 DIAGNOSIS — E1122 Type 2 diabetes mellitus with diabetic chronic kidney disease: Secondary | ICD-10-CM | POA: Diagnosis not present

## 2019-09-16 DIAGNOSIS — F5105 Insomnia due to other mental disorder: Secondary | ICD-10-CM | POA: Diagnosis present

## 2019-09-16 DIAGNOSIS — D72829 Elevated white blood cell count, unspecified: Secondary | ICD-10-CM | POA: Diagnosis present

## 2019-09-16 DIAGNOSIS — F332 Major depressive disorder, recurrent severe without psychotic features: Secondary | ICD-10-CM | POA: Diagnosis present

## 2019-09-16 DIAGNOSIS — I214 Non-ST elevation (NSTEMI) myocardial infarction: Secondary | ICD-10-CM | POA: Diagnosis not present

## 2019-09-16 DIAGNOSIS — I129 Hypertensive chronic kidney disease with stage 1 through stage 4 chronic kidney disease, or unspecified chronic kidney disease: Secondary | ICD-10-CM | POA: Diagnosis present

## 2019-09-16 DIAGNOSIS — F3341 Major depressive disorder, recurrent, in partial remission: Secondary | ICD-10-CM | POA: Diagnosis not present

## 2019-09-16 DIAGNOSIS — I080 Rheumatic disorders of both mitral and aortic valves: Secondary | ICD-10-CM | POA: Diagnosis not present

## 2019-09-16 DIAGNOSIS — Z8673 Personal history of transient ischemic attack (TIA), and cerebral infarction without residual deficits: Secondary | ICD-10-CM | POA: Diagnosis not present

## 2019-09-16 DIAGNOSIS — I484 Atypical atrial flutter: Secondary | ICD-10-CM | POA: Diagnosis not present

## 2019-09-16 DIAGNOSIS — E785 Hyperlipidemia, unspecified: Secondary | ICD-10-CM | POA: Diagnosis present

## 2019-09-16 DIAGNOSIS — N3281 Overactive bladder: Secondary | ICD-10-CM | POA: Diagnosis present

## 2019-09-16 DIAGNOSIS — I517 Cardiomegaly: Secondary | ICD-10-CM | POA: Diagnosis not present

## 2019-09-16 DIAGNOSIS — M25512 Pain in left shoulder: Secondary | ICD-10-CM | POA: Diagnosis present

## 2019-09-16 DIAGNOSIS — E875 Hyperkalemia: Secondary | ICD-10-CM | POA: Diagnosis present

## 2019-09-16 DIAGNOSIS — M546 Pain in thoracic spine: Secondary | ICD-10-CM | POA: Diagnosis present

## 2019-09-16 DIAGNOSIS — N1831 Chronic kidney disease, stage 3a: Secondary | ICD-10-CM | POA: Diagnosis not present

## 2019-09-21 DIAGNOSIS — I129 Hypertensive chronic kidney disease with stage 1 through stage 4 chronic kidney disease, or unspecified chronic kidney disease: Secondary | ICD-10-CM | POA: Diagnosis not present

## 2019-09-21 DIAGNOSIS — Z933 Colostomy status: Secondary | ICD-10-CM | POA: Diagnosis not present

## 2019-09-21 DIAGNOSIS — K5981 Ogilvie syndrome: Secondary | ICD-10-CM | POA: Diagnosis not present

## 2019-09-21 DIAGNOSIS — M1990 Unspecified osteoarthritis, unspecified site: Secondary | ICD-10-CM | POA: Diagnosis not present

## 2019-09-21 DIAGNOSIS — N1831 Chronic kidney disease, stage 3a: Secondary | ICD-10-CM | POA: Diagnosis not present

## 2019-09-21 DIAGNOSIS — E785 Hyperlipidemia, unspecified: Secondary | ICD-10-CM | POA: Diagnosis not present

## 2019-09-21 DIAGNOSIS — Z8546 Personal history of malignant neoplasm of prostate: Secondary | ICD-10-CM | POA: Diagnosis not present

## 2019-09-21 DIAGNOSIS — I2510 Atherosclerotic heart disease of native coronary artery without angina pectoris: Secondary | ICD-10-CM | POA: Diagnosis not present

## 2019-09-21 DIAGNOSIS — Z8673 Personal history of transient ischemic attack (TIA), and cerebral infarction without residual deficits: Secondary | ICD-10-CM | POA: Diagnosis not present

## 2019-09-21 DIAGNOSIS — E875 Hyperkalemia: Secondary | ICD-10-CM | POA: Diagnosis not present

## 2019-09-21 DIAGNOSIS — M1030 Gout due to renal impairment, unspecified site: Secondary | ICD-10-CM | POA: Diagnosis not present

## 2019-09-21 DIAGNOSIS — F418 Other specified anxiety disorders: Secondary | ICD-10-CM | POA: Diagnosis not present

## 2019-09-21 DIAGNOSIS — Z7984 Long term (current) use of oral hypoglycemic drugs: Secondary | ICD-10-CM | POA: Diagnosis not present

## 2019-09-21 DIAGNOSIS — I480 Paroxysmal atrial fibrillation: Secondary | ICD-10-CM | POA: Diagnosis not present

## 2019-09-21 DIAGNOSIS — G3184 Mild cognitive impairment, so stated: Secondary | ICD-10-CM | POA: Diagnosis not present

## 2019-09-21 DIAGNOSIS — Z87891 Personal history of nicotine dependence: Secondary | ICD-10-CM | POA: Diagnosis not present

## 2019-09-21 DIAGNOSIS — I484 Atypical atrial flutter: Secondary | ICD-10-CM | POA: Diagnosis not present

## 2019-09-21 DIAGNOSIS — G4730 Sleep apnea, unspecified: Secondary | ICD-10-CM | POA: Diagnosis not present

## 2019-09-21 DIAGNOSIS — N3281 Overactive bladder: Secondary | ICD-10-CM | POA: Diagnosis not present

## 2019-09-21 DIAGNOSIS — F322 Major depressive disorder, single episode, severe without psychotic features: Secondary | ICD-10-CM | POA: Diagnosis not present

## 2019-09-21 DIAGNOSIS — I214 Non-ST elevation (NSTEMI) myocardial infarction: Secondary | ICD-10-CM | POA: Diagnosis not present

## 2019-09-21 DIAGNOSIS — F5105 Insomnia due to other mental disorder: Secondary | ICD-10-CM | POA: Diagnosis not present

## 2019-09-21 DIAGNOSIS — D509 Iron deficiency anemia, unspecified: Secondary | ICD-10-CM | POA: Diagnosis not present

## 2019-09-21 DIAGNOSIS — E1122 Type 2 diabetes mellitus with diabetic chronic kidney disease: Secondary | ICD-10-CM | POA: Diagnosis not present

## 2019-09-21 DIAGNOSIS — Z7902 Long term (current) use of antithrombotics/antiplatelets: Secondary | ICD-10-CM | POA: Diagnosis not present

## 2019-09-22 DIAGNOSIS — Z20828 Contact with and (suspected) exposure to other viral communicable diseases: Secondary | ICD-10-CM | POA: Diagnosis not present

## 2019-09-26 DIAGNOSIS — I129 Hypertensive chronic kidney disease with stage 1 through stage 4 chronic kidney disease, or unspecified chronic kidney disease: Secondary | ICD-10-CM | POA: Diagnosis not present

## 2019-09-26 DIAGNOSIS — I214 Non-ST elevation (NSTEMI) myocardial infarction: Secondary | ICD-10-CM | POA: Diagnosis not present

## 2019-09-26 DIAGNOSIS — F322 Major depressive disorder, single episode, severe without psychotic features: Secondary | ICD-10-CM | POA: Diagnosis not present

## 2019-09-26 DIAGNOSIS — I2510 Atherosclerotic heart disease of native coronary artery without angina pectoris: Secondary | ICD-10-CM | POA: Diagnosis not present

## 2019-09-26 DIAGNOSIS — I484 Atypical atrial flutter: Secondary | ICD-10-CM | POA: Diagnosis not present

## 2019-09-26 DIAGNOSIS — I480 Paroxysmal atrial fibrillation: Secondary | ICD-10-CM | POA: Diagnosis not present

## 2019-09-29 DIAGNOSIS — F322 Major depressive disorder, single episode, severe without psychotic features: Secondary | ICD-10-CM | POA: Diagnosis not present

## 2019-09-29 DIAGNOSIS — I480 Paroxysmal atrial fibrillation: Secondary | ICD-10-CM | POA: Diagnosis not present

## 2019-09-29 DIAGNOSIS — I2510 Atherosclerotic heart disease of native coronary artery without angina pectoris: Secondary | ICD-10-CM | POA: Diagnosis not present

## 2019-09-29 DIAGNOSIS — I214 Non-ST elevation (NSTEMI) myocardial infarction: Secondary | ICD-10-CM | POA: Diagnosis not present

## 2019-09-29 DIAGNOSIS — I129 Hypertensive chronic kidney disease with stage 1 through stage 4 chronic kidney disease, or unspecified chronic kidney disease: Secondary | ICD-10-CM | POA: Diagnosis not present

## 2019-09-29 DIAGNOSIS — I484 Atypical atrial flutter: Secondary | ICD-10-CM | POA: Diagnosis not present

## 2019-10-03 DIAGNOSIS — I129 Hypertensive chronic kidney disease with stage 1 through stage 4 chronic kidney disease, or unspecified chronic kidney disease: Secondary | ICD-10-CM | POA: Diagnosis not present

## 2019-10-03 DIAGNOSIS — I214 Non-ST elevation (NSTEMI) myocardial infarction: Secondary | ICD-10-CM | POA: Diagnosis not present

## 2019-10-03 DIAGNOSIS — F322 Major depressive disorder, single episode, severe without psychotic features: Secondary | ICD-10-CM | POA: Diagnosis not present

## 2019-10-03 DIAGNOSIS — I480 Paroxysmal atrial fibrillation: Secondary | ICD-10-CM | POA: Diagnosis not present

## 2019-10-03 DIAGNOSIS — I2510 Atherosclerotic heart disease of native coronary artery without angina pectoris: Secondary | ICD-10-CM | POA: Diagnosis not present

## 2019-10-03 DIAGNOSIS — I484 Atypical atrial flutter: Secondary | ICD-10-CM | POA: Diagnosis not present

## 2019-10-05 ENCOUNTER — Ambulatory Visit (INDEPENDENT_AMBULATORY_CARE_PROVIDER_SITE_OTHER): Payer: Medicare Other | Admitting: Osteopathic Medicine

## 2019-10-05 ENCOUNTER — Encounter: Payer: Self-pay | Admitting: Osteopathic Medicine

## 2019-10-05 DIAGNOSIS — E1122 Type 2 diabetes mellitus with diabetic chronic kidney disease: Secondary | ICD-10-CM

## 2019-10-05 DIAGNOSIS — E1169 Type 2 diabetes mellitus with other specified complication: Secondary | ICD-10-CM

## 2019-10-05 DIAGNOSIS — E1159 Type 2 diabetes mellitus with other circulatory complications: Secondary | ICD-10-CM | POA: Diagnosis not present

## 2019-10-05 DIAGNOSIS — F332 Major depressive disorder, recurrent severe without psychotic features: Secondary | ICD-10-CM

## 2019-10-05 DIAGNOSIS — I152 Hypertension secondary to endocrine disorders: Secondary | ICD-10-CM

## 2019-10-05 DIAGNOSIS — E785 Hyperlipidemia, unspecified: Secondary | ICD-10-CM

## 2019-10-05 DIAGNOSIS — Z8673 Personal history of transient ischemic attack (TIA), and cerebral infarction without residual deficits: Secondary | ICD-10-CM | POA: Diagnosis not present

## 2019-10-05 DIAGNOSIS — I10 Essential (primary) hypertension: Secondary | ICD-10-CM | POA: Diagnosis not present

## 2019-10-05 DIAGNOSIS — R1013 Epigastric pain: Secondary | ICD-10-CM

## 2019-10-05 MED ORDER — FLUOXETINE HCL 20 MG/5ML PO SOLN: 20.0000 mg | Freq: Every day | ORAL | 1 refills | Status: DC

## 2019-10-05 MED ORDER — ATORVASTATIN CALCIUM 20 MG PO TABS: 20.0000 mg | ORAL_TABLET | Freq: Every day | ORAL | 3 refills | Status: DC

## 2019-10-05 MED ORDER — LINAGLIPTIN 5 MG PO TABS: 5.0000 mg | ORAL_TABLET | Freq: Every day | ORAL | 1 refills | Status: DC

## 2019-10-05 MED ORDER — METOPROLOL SUCCINATE ER 50 MG PO TB24
50.0000 mg | ORAL_TABLET | Freq: Every day | ORAL | 1 refills | Status: DC
Start: 2019-10-05 — End: 2019-10-19

## 2019-10-05 MED ORDER — OMEPRAZOLE 20 MG PO CPDR: 20.0000 mg | DELAYED_RELEASE_CAPSULE | Freq: Every day | ORAL | 1 refills | Status: DC

## 2019-10-05 MED ORDER — ESCITALOPRAM OXALATE 10 MG PO TABS
10.0000 mg | ORAL_TABLET | Freq: Every day | ORAL | 1 refills | Status: DC
Start: 2019-10-05 — End: 2019-10-19

## 2019-10-05 MED ORDER — CLOPIDOGREL BISULFATE 75 MG PO TABS: 75.0000 mg | ORAL_TABLET | Freq: Every day | ORAL | 3 refills | Status: DC

## 2019-10-05 MED ORDER — OXYBUTYNIN CHLORIDE ER 10 MG PO TB24
10.0000 mg | ORAL_TABLET | Freq: Every day | ORAL | 3 refills | Status: DC
Start: 2019-10-05 — End: 2020-11-12

## 2019-10-05 MED ORDER — IRBESARTAN 75 MG PO TABS: 75.0000 mg | ORAL_TABLET | Freq: Every day | ORAL | 3 refills | Status: DC

## 2019-10-05 NOTE — Progress Notes (Signed)
Damon Acosta is a 79 y.o. male who presents to  [place] at [facility]  today, 10/05/19, seeking care for the following: Hospital follow-up  CARDIOVASCULAR Hx: NSTEMI, Atrial Fib/Flutter Meds: amlodipine, metoprolol 25 mg, irbesartan were all d/c on discharge. He is currently on apixiban 2.5 mg daily, atorvastatin 20 mg daily, clopidogrel 75 mg daily, metoprolol XR 0 mg bid  NEUROLOGICAL Hx: CVA past, no new issues  PSYCHIATRIC Hx: depression - states he is doing very well on his new medications, taking "the liquid stuff and the pills the hospital gave me" (Prozac 20 mg liquid + lexapro 10 mg tablets) Meds: was on Prozac liquid Rx by me (concerned for possibly throwing up pills), Psych saw him in hospital, recommended Lexapro - he has been taking both   RENAL Hx: AKI in hospital, Hx CKD3. Cr 1.40 on discharge. Down from 2.25 on 09/16/19 Meds:   GASTROINTESTINAL Hx: Colostomy, nausea, loose stool. chronic loose stool issue, we had referred him to GI but expedited appointment could not be arranged/ Currently he has a visit scheduled w/ Dr Cindee Salt 10/07/19. Malnutrition, low albumin.  Meds:   ENDOCRINE Hx: DM2. A1C 09/16/19 was 6.6 in hospital  Meds: Glimepiride was d/c       ASSESSMENT & PLAN with other pertinent findings:  Diagnoses of Severe episode of recurrent major depressive disorder, without psychotic features ([place]), Type 2 diabetes mellitus with chronic kidney disease, without long-term current use of insulin, unspecified CKD stage ([place]), History of CVA (cerebrovascular accident), Dyslipidemia associated with type 2 diabetes mellitus ([place]), Hypertension associated with diabetes ([place]), and Dyspepsia were pertinent to this visit.   Pt feeling mentally well, dos not feel he needs psychiatry at this time. Low-mis doses 2 SSRI a bit unusual and likely unintentional from hospital (not sure if they knew he was also on Prozac but it's on  the med list from [place]...) but seems to be working well for him so I think ok to continue  Feeling tired, BP a bit low, no dizziness/lightheaded. Participating in PT at home. Taking more BP Rx than hospital d/c recommended, I think ok to stop the amlodipine.   Spent significant time on med rec- patient does not recall going over any of this on hospital discharge.     Patient Instructions  [facility], Dr Cindee Salt Appointment 10/07/2019 Address: [place], [place],  [address] Phone: [phone]   See attached medication list  Anything NOT on the list - STOP (especially almodipine, metoprolol 25 mg --> changed to 50 mg, glimepiride)   Pay attention to the medications and when to take them  Let me know if any questions!         Orders Placed This Encounter  Procedures  . COMPLETE METABOLIC PANEL WITH GFR    Current Outpatient Medications  Medication Instructions  . acetaminophen (TYLENOL) 650-1,300 mg, Oral, Every 8 hours PRN  . allopurinol (ZYLOPRIM) 300 mg, Oral, Daily  . apixaban (ELIQUIS) 2.5 mg, Oral, 2 times daily  . atorvastatin (LIPITOR) 20 mg, Oral, Daily  . clopidogrel (PLAVIX) 75 mg, Oral, Daily  . escitalopram (LEXAPRO) 10 mg, Oral, Daily  . FLUoxetine (PROZAC) 20 mg, Oral, Daily  . irbesartan (AVAPRO) 75 mg, Oral, Daily  . linagliptin (TRADJENTA) 5 mg, Oral, Daily  . metoprolol succinate (TOPROL-XL) 50 mg, Oral, Daily  . nitroGLYCERIN (NITROSTAT) 0.6 mg, Sublingual, Every 5 min PRN  . omeprazole (PRILOSEC) 20 mg, Oral, Daily  .  oxybutynin (DITROPAN-XL) 10 mg, Oral, Daily at bedtime  . traMADol (ULTRAM) 50-100 mg, Oral, Every 8 hours PRN, Maximum 6 tabs per day.       Follow-up instructions: Return in about 2 weeks (around 10/19/2019) for monitor blood pressure on changed medications, see me sooner if needed .                                         BP 100/65 (BP Location: Left Arm,  Patient Position: Sitting)   Pulse 85   Wt 191 lb (86.6 kg)   SpO2 100%   BMI 28.21 kg/m   Current Meds  Medication Sig  . acetaminophen (TYLENOL) 650 MG CR tablet Take 1-2 tablets (650-1,300 mg total) by mouth every 8 (eight) hours as needed for pain.  Marland Kitchen allopurinol (ZYLOPRIM) 300 MG tablet Take 1 tablet (300 mg total) by mouth daily.  Marland Kitchen apixaban (ELIQUIS) 2.5 MG TABS tablet Take 2.5 mg by mouth 2 (two) times daily.   Marland Kitchen atorvastatin (LIPITOR) 20 MG tablet Take 1 tablet (20 mg total) by mouth daily.  . clopidogrel (PLAVIX) 75 MG tablet Take 1 tablet (75 mg total) by mouth daily.  Marland Kitchen escitalopram (LEXAPRO) 10 MG tablet Take 1 tablet (10 mg total) by mouth daily.  Marland Kitchen FLUoxetine (PROZAC) 20 MG/5ML solution Take 5 mLs (20 mg total) by mouth daily.  . irbesartan (AVAPRO) 75 MG tablet Take 1 tablet (75 mg total) by mouth daily.  Marland Kitchen linagliptin (TRADJENTA) 5 MG TABS tablet Take 1 tablet (5 mg total) by mouth daily.  . metoprolol succinate (TOPROL-XL) 50 MG 24 hr tablet Take 1 tablet (50 mg total) by mouth daily.  . nitroGLYCERIN (NITROSTAT) 0.6 MG SL tablet Place 0.6 mg under the tongue every 5 (five) minutes as needed for chest pain.  Marland Kitchen omeprazole (PRILOSEC) 20 MG capsule Take 1 capsule (20 mg total) by mouth daily.  Marland Kitchen oxybutynin (DITROPAN-XL) 10 MG 24 hr tablet Take 1 tablet (10 mg total) by mouth at bedtime.  . traMADol (ULTRAM) 50 MG tablet Take 1-2 tablets (50-100 mg total) by mouth every 8 (eight) hours as needed for moderate pain. Maximum 6 tabs per day.  . [DISCONTINUED] AMBULATORY NON FORMULARY MEDICATION Blood Pressure Monitor- use to check blood pressures 1-2 times daily  . [DISCONTINUED] amLODipine (NORVASC) 2.5 MG tablet Take 1 tablet (2.5 mg total) by mouth daily.  . [DISCONTINUED] atorvastatin (LIPITOR) 20 MG tablet Take 1 tablet (20 mg total) by mouth daily at 6 PM. (Patient taking differently: Take 20 mg by mouth daily. )  . [DISCONTINUED] blood glucose meter kit and supplies KIT  Dispense based on patient and insurance preference. Use up to four times daily as directed. Please include lancets, test strips, control solution.  . [DISCONTINUED] clopidogrel (PLAVIX) 75 MG tablet Take 1 tablet (75 mg total) by mouth daily.  . [DISCONTINUED] escitalopram (LEXAPRO) 10 MG tablet Take 10 mg by mouth daily.   . [DISCONTINUED] FLUoxetine (PROZAC) 20 MG/5ML solution TAKE 2.5 MLS (10 MG TOTAL) BY MOUTH DAILY FOR 7 DAYS, THEN 5 MLS (20 MG TOTAL) DAILY.  . [DISCONTINUED] fluticasone (FLONASE) 50 MCG/ACT nasal spray Place 2 sprays into both nostrils daily.  . [DISCONTINUED] irbesartan (AVAPRO) 75 MG tablet Take 1 tablet (75 mg total) by mouth daily.  . [DISCONTINUED] linagliptin (TRADJENTA) 5 MG TABS tablet Take 1 tablet (5 mg total) by mouth daily.  . [  DISCONTINUED] metoprolol succinate (TOPROL-XL) 50 MG 24 hr tablet Take 50 mg by mouth daily.   . [DISCONTINUED] omeprazole (PRILOSEC) 20 MG capsule Take 1 capsule (20 mg total) by mouth 2 (two) times daily. (Patient taking differently: Take 20 mg by mouth daily. )  . [DISCONTINUED] oxybutynin (DITROPAN-XL) 10 MG 24 hr tablet Take 1 tablet (10 mg total) by mouth daily. (Patient taking differently: Take 10 mg by mouth at bedtime. )    No results found for this or any previous visit (from the past 72 hour(s)).  No results found.   Acetaminophen  Allopurinol 300 mg daily Atorvastatin 20 mg daily Plavix 75 mg daily Prozac 20 mg (/5 mL soln) linagliptin 5 mg daily oxybutinin 10 mg daily Potassium Cl 10 mEq daily Tramadol 50-100 mg q8h prn  Eliquis 2.5 mg bid  escitalopram 10 mg daily  Methocarbamol 500 mg bid  Sodium bicarb 650 mg tid  Metoprolol XR 50 mg bid   acetaminophen (TYLENOL ARTHRITIS,MAPAP) 650 MG CR tablet  Take by mouth as needed.  0 04/20/2018   allopurinol (ZYLOPRIM) 300 mg tablet  Take 300 mg by mouth daily.   0 01/29/2018   atorvastatin (LIPITOR) 20 mg tablet  Take 20 mg by mouth daily.   0 01/29/2018    clopidogrel bisulfate (PLAVIX) 75 mg tablet  Take 75 mg by mouth.  0 03/10/2019   FLUoxetine (PROZAC) 20 mg/5 mL solution  Take 20 mg by mouth daily. $RemoveBefo'20mg'[id number]$  = 41mL  0 09/05/2019   linaGLIPtin (TRADJENTA) 5 mg tablet  Take 5 mg by mouth daily.  0 04/27/2018   oxybutynin (DITROPAN-XL) 10 MG 24 hr tablet  Take 10 mg by mouth daily.   0 01/29/2018   POTASSIUM CHLORIDE 10 MEQ tablet  Take 10 mEq by mouth daily.  0 01/29/2018   traMADol (ULTRAM) 50 mg tablet  Take 50-100 mg by mouth every 8 (eight) hours as needed.   0 04/20/2018   apixaban (ELIQUIS) 2.5 mg tablet  Indications: Atrial Fibrillation Take one tablet (2.5 mg dose) by mouth 2 (two) times daily. 60 tablet  0 09/19/2019   escitalopram oxalate (LEXAPRO) 10 mg tablet  Take one tablet (10 mg dose) by mouth daily. 30 tablet  0 09/20/2019   methocarbamol (ROBAXIN) 500 MG tablet  Take 500 mg by mouth 2 (two) times daily.  0 12/29/2017   metoprolol succinate (TOPROL-XL) 50 mg 24 hr tablet  Take one tablet (50 mg dose) by mouth 2 (two) times daily. 30 tablet  0 09/19/2019   nitroGLYCERIN (NITROSTAT) 0.6 mg SL tablet  Place 0.6 mg under the tongue as needed.  0    sodium bicarbonate 650 mg tablet  Take one tablet (650 mg dose) by mouth 3 (three) times a day. 90 tablet  0 09/19/2019 09/18/2020  Ordered Prescriptions - documented in this encounter Reconcile with Patient's Chart Prescription Sig Dispensed Refills Start Date End Date  sodium bicarbonate 650 mg tablet  Take one tablet (650 mg dose) by mouth 3 (three) times a day. 90 tablet  0 09/19/2019 09/18/2020  escitalopram oxalate (LEXAPRO) 10 mg tablet  Take one tablet (10 mg dose) by mouth daily. 30 tablet  0 09/20/2019   apixaban (ELIQUIS) 2.5 mg tablet  Indications: Atrial Fibrillation Take one tablet (2.5 mg dose) by mouth 2 (two) times daily. 60 tablet  0 09/19/2019   metoprolol succinate (TOPROL-XL) 50 mg 24 hr tablet  Take one tablet (50 mg dose) by  mouth 2 (two) times daily.  30 tablet  0 09/19/2019       All questions at time of visit were answered - patient instructed to contact office with any additional concerns or updates.  ER/RTC precautions were reviewed with the patient as applicable.   Please note: voice recognition software was used to produce this document, and typos may escape review. Please contact Dr. Sheppard Coil for any needed clarifications.   Total encounter time: 40 minutes.

## 2019-10-05 NOTE — Patient Instructions (Addendum)
[facility], Dr Cindee Salt Appointment 10/07/2019 Address: [place], [place], [place] [address] Phone: [phone]   See attached medication list  Anything NOT on the list - STOP (especially almodipine, metoprolol 25 mg --> changed to 50 mg, glimepiride)   Pay attention to the medications and when to take them  Let me know if any questions!

## 2019-10-06 DIAGNOSIS — I2510 Atherosclerotic heart disease of native coronary artery without angina pectoris: Secondary | ICD-10-CM | POA: Diagnosis not present

## 2019-10-06 DIAGNOSIS — I214 Non-ST elevation (NSTEMI) myocardial infarction: Secondary | ICD-10-CM | POA: Diagnosis not present

## 2019-10-06 DIAGNOSIS — I480 Paroxysmal atrial fibrillation: Secondary | ICD-10-CM | POA: Diagnosis not present

## 2019-10-06 DIAGNOSIS — I484 Atypical atrial flutter: Secondary | ICD-10-CM | POA: Diagnosis not present

## 2019-10-06 DIAGNOSIS — I129 Hypertensive chronic kidney disease with stage 1 through stage 4 chronic kidney disease, or unspecified chronic kidney disease: Secondary | ICD-10-CM | POA: Diagnosis not present

## 2019-10-06 DIAGNOSIS — F322 Major depressive disorder, single episode, severe without psychotic features: Secondary | ICD-10-CM | POA: Diagnosis not present

## 2019-10-06 LAB — COMPLETE METABOLIC PANEL WITH GFR
AG Ratio: 1.3 (calc) (ref 1.0–2.5)
ALT: 13 U/L (ref 9–46)
AST: 15 U/L (ref 10–35)
Albumin: 3.8 g/dL (ref 3.6–5.1)
Alkaline phosphatase (APISO): 104 U/L (ref 35–144)
BUN/Creatinine Ratio: 14 (calc) (ref 6–22)
BUN: 23 mg/dL (ref 7–25)
CO2: 26 mmol/L (ref 20–32)
Calcium: 9.3 mg/dL (ref 8.6–10.3)
Chloride: 104 mmol/L (ref 98–110)
Creat: 1.7 mg/dL — ABNORMAL HIGH (ref 0.70–1.11)
GFR, Est African American: 43 mL/min/{1.73_m2} — ABNORMAL LOW (ref >=60)
GFR, Est Non African American: 37 mL/min/{1.73_m2} — ABNORMAL LOW (ref >=60)
Globulin: 2.9 g/dL (calc) (ref 1.9–3.7)
Glucose, Bld: 78 mg/dL (ref 65–99)
Potassium: 6.1 mmol/L — ABNORMAL HIGH (ref 3.5–5.3)
Sodium: 136 mmol/L (ref 135–146)
Total Bilirubin: 0.6 mg/dL (ref 0.2–1.2)
Total Protein: 6.7 g/dL (ref 6.1–8.1)

## 2019-10-07 DIAGNOSIS — Z888 Allergy status to other drugs, medicaments and biological substances status: Secondary | ICD-10-CM | POA: Diagnosis not present

## 2019-10-07 DIAGNOSIS — Z79899 Other long term (current) drug therapy: Secondary | ICD-10-CM | POA: Diagnosis not present

## 2019-10-07 DIAGNOSIS — Z7902 Long term (current) use of antithrombotics/antiplatelets: Secondary | ICD-10-CM | POA: Diagnosis not present

## 2019-10-07 DIAGNOSIS — Z7984 Long term (current) use of oral hypoglycemic drugs: Secondary | ICD-10-CM | POA: Diagnosis not present

## 2019-10-07 DIAGNOSIS — R748 Abnormal levels of other serum enzymes: Secondary | ICD-10-CM | POA: Diagnosis not present

## 2019-10-07 DIAGNOSIS — Z932 Ileostomy status: Secondary | ICD-10-CM | POA: Diagnosis not present

## 2019-10-07 DIAGNOSIS — I129 Hypertensive chronic kidney disease with stage 1 through stage 4 chronic kidney disease, or unspecified chronic kidney disease: Secondary | ICD-10-CM | POA: Diagnosis not present

## 2019-10-07 DIAGNOSIS — E1122 Type 2 diabetes mellitus with diabetic chronic kidney disease: Secondary | ICD-10-CM | POA: Diagnosis not present

## 2019-10-07 DIAGNOSIS — Z8546 Personal history of malignant neoplasm of prostate: Secondary | ICD-10-CM | POA: Diagnosis not present

## 2019-10-07 DIAGNOSIS — Z7901 Long term (current) use of anticoagulants: Secondary | ICD-10-CM | POA: Diagnosis not present

## 2019-10-07 DIAGNOSIS — R197 Diarrhea, unspecified: Secondary | ICD-10-CM | POA: Diagnosis not present

## 2019-10-07 DIAGNOSIS — K219 Gastro-esophageal reflux disease without esophagitis: Secondary | ICD-10-CM | POA: Insufficient documentation

## 2019-10-07 DIAGNOSIS — Z87891 Personal history of nicotine dependence: Secondary | ICD-10-CM | POA: Diagnosis not present

## 2019-10-07 DIAGNOSIS — Z8673 Personal history of transient ischemic attack (TIA), and cerebral infarction without residual deficits: Secondary | ICD-10-CM | POA: Diagnosis not present

## 2019-10-07 DIAGNOSIS — E785 Hyperlipidemia, unspecified: Secondary | ICD-10-CM | POA: Diagnosis not present

## 2019-10-07 DIAGNOSIS — F329 Major depressive disorder, single episode, unspecified: Secondary | ICD-10-CM | POA: Diagnosis not present

## 2019-10-07 DIAGNOSIS — Z91013 Allergy to seafood: Secondary | ICD-10-CM | POA: Diagnosis not present

## 2019-10-07 DIAGNOSIS — N183 Chronic kidney disease, stage 3 unspecified: Secondary | ICD-10-CM | POA: Diagnosis not present

## 2019-10-07 DIAGNOSIS — R0602 Shortness of breath: Secondary | ICD-10-CM | POA: Diagnosis not present

## 2019-10-07 DIAGNOSIS — K5289 Other specified noninfective gastroenteritis and colitis: Secondary | ICD-10-CM | POA: Diagnosis not present

## 2019-10-07 DIAGNOSIS — F419 Anxiety disorder, unspecified: Secondary | ICD-10-CM | POA: Diagnosis not present

## 2019-10-07 NOTE — Progress Notes (Signed)
Dr. Loni Muse is out of office. Your kidney function is dropping again from hospital discharge. Your potassium is elevated. Potassium elevation can be very dangerous I suggest to go hospital.

## 2019-10-10 ENCOUNTER — Telehealth: Payer: Self-pay

## 2019-10-10 DIAGNOSIS — I129 Hypertensive chronic kidney disease with stage 1 through stage 4 chronic kidney disease, or unspecified chronic kidney disease: Secondary | ICD-10-CM | POA: Diagnosis not present

## 2019-10-10 DIAGNOSIS — I214 Non-ST elevation (NSTEMI) myocardial infarction: Secondary | ICD-10-CM | POA: Diagnosis not present

## 2019-10-10 DIAGNOSIS — I2510 Atherosclerotic heart disease of native coronary artery without angina pectoris: Secondary | ICD-10-CM | POA: Diagnosis not present

## 2019-10-10 DIAGNOSIS — I484 Atypical atrial flutter: Secondary | ICD-10-CM | POA: Diagnosis not present

## 2019-10-10 DIAGNOSIS — I480 Paroxysmal atrial fibrillation: Secondary | ICD-10-CM | POA: Diagnosis not present

## 2019-10-10 DIAGNOSIS — F322 Major depressive disorder, single episode, severe without psychotic features: Secondary | ICD-10-CM | POA: Diagnosis not present

## 2019-10-10 NOTE — Telephone Encounter (Signed)
Patient's home health service called, the patient was in went to the ER on Friday last week. The patient says the hospital was suppose to send in medications for the patient but never did. The pharmacy was called and it was confirmed they never sent in RX they wanted him to star. Please advise

## 2019-10-11 NOTE — Telephone Encounter (Signed)
From my review of the notes from ER they checked everything out and he was ok, no new meds recommended.   The ER might have printed his OLD list, he needs to go by the UPDATED list he and I went over at his visit - (highlighted it in yellow and orange)

## 2019-10-12 DIAGNOSIS — I480 Paroxysmal atrial fibrillation: Secondary | ICD-10-CM | POA: Diagnosis not present

## 2019-10-12 DIAGNOSIS — I129 Hypertensive chronic kidney disease with stage 1 through stage 4 chronic kidney disease, or unspecified chronic kidney disease: Secondary | ICD-10-CM | POA: Diagnosis not present

## 2019-10-12 DIAGNOSIS — I214 Non-ST elevation (NSTEMI) myocardial infarction: Secondary | ICD-10-CM | POA: Diagnosis not present

## 2019-10-12 DIAGNOSIS — I2510 Atherosclerotic heart disease of native coronary artery without angina pectoris: Secondary | ICD-10-CM | POA: Diagnosis not present

## 2019-10-12 DIAGNOSIS — I484 Atypical atrial flutter: Secondary | ICD-10-CM | POA: Diagnosis not present

## 2019-10-12 DIAGNOSIS — F322 Major depressive disorder, single episode, severe without psychotic features: Secondary | ICD-10-CM | POA: Diagnosis not present

## 2019-10-13 DIAGNOSIS — I214 Non-ST elevation (NSTEMI) myocardial infarction: Secondary | ICD-10-CM | POA: Diagnosis not present

## 2019-10-13 DIAGNOSIS — I2510 Atherosclerotic heart disease of native coronary artery without angina pectoris: Secondary | ICD-10-CM | POA: Diagnosis not present

## 2019-10-13 DIAGNOSIS — I480 Paroxysmal atrial fibrillation: Secondary | ICD-10-CM | POA: Diagnosis not present

## 2019-10-13 DIAGNOSIS — I484 Atypical atrial flutter: Secondary | ICD-10-CM | POA: Diagnosis not present

## 2019-10-13 DIAGNOSIS — F322 Major depressive disorder, single episode, severe without psychotic features: Secondary | ICD-10-CM | POA: Diagnosis not present

## 2019-10-13 DIAGNOSIS — I129 Hypertensive chronic kidney disease with stage 1 through stage 4 chronic kidney disease, or unspecified chronic kidney disease: Secondary | ICD-10-CM | POA: Diagnosis not present

## 2019-10-14 NOTE — Telephone Encounter (Signed)
Routing to covering provider. Pt updated of provider's note/recommendation. Pt mentioned that his potassium levels was low when he was in the hospital. Requesting if he should be taking potassium for low levels? If so, requesting a med refill. Rx not listed in active med list.

## 2019-10-14 NOTE — Telephone Encounter (Signed)
Task completed. Pt is aware of covering provider's note. Agreeable with plan. No other inquiries during the call.

## 2019-10-14 NOTE — Telephone Encounter (Signed)
Reviewed labs from 8/25- Potassium high at 6.1 Repeat labs at [facility] from 8/27- Potassium 5.1  I do not see any labs showing low potassium.  I would not recommend a potassium supplement.

## 2019-10-18 DIAGNOSIS — I129 Hypertensive chronic kidney disease with stage 1 through stage 4 chronic kidney disease, or unspecified chronic kidney disease: Secondary | ICD-10-CM | POA: Diagnosis not present

## 2019-10-18 DIAGNOSIS — F322 Major depressive disorder, single episode, severe without psychotic features: Secondary | ICD-10-CM | POA: Diagnosis not present

## 2019-10-18 DIAGNOSIS — I214 Non-ST elevation (NSTEMI) myocardial infarction: Secondary | ICD-10-CM | POA: Diagnosis not present

## 2019-10-18 DIAGNOSIS — I480 Paroxysmal atrial fibrillation: Secondary | ICD-10-CM | POA: Diagnosis not present

## 2019-10-18 DIAGNOSIS — I2510 Atherosclerotic heart disease of native coronary artery without angina pectoris: Secondary | ICD-10-CM | POA: Diagnosis not present

## 2019-10-18 DIAGNOSIS — I484 Atypical atrial flutter: Secondary | ICD-10-CM | POA: Diagnosis not present

## 2019-10-19 ENCOUNTER — Ambulatory Visit (INDEPENDENT_AMBULATORY_CARE_PROVIDER_SITE_OTHER): Payer: Medicare Other | Admitting: Osteopathic Medicine

## 2019-10-19 ENCOUNTER — Encounter: Payer: Self-pay | Admitting: Osteopathic Medicine

## 2019-10-19 VITALS — BP 121/71 | HR 90 | Wt 193.0 lb

## 2019-10-19 DIAGNOSIS — E1159 Type 2 diabetes mellitus with other circulatory complications: Secondary | ICD-10-CM

## 2019-10-19 DIAGNOSIS — N183 Chronic kidney disease, stage 3 unspecified: Secondary | ICD-10-CM | POA: Diagnosis not present

## 2019-10-19 DIAGNOSIS — E1122 Type 2 diabetes mellitus with diabetic chronic kidney disease: Secondary | ICD-10-CM | POA: Diagnosis not present

## 2019-10-19 DIAGNOSIS — F332 Major depressive disorder, recurrent severe without psychotic features: Secondary | ICD-10-CM

## 2019-10-19 DIAGNOSIS — M48062 Spinal stenosis, lumbar region with neurogenic claudication: Secondary | ICD-10-CM

## 2019-10-19 DIAGNOSIS — I10 Essential (primary) hypertension: Secondary | ICD-10-CM | POA: Diagnosis not present

## 2019-10-19 DIAGNOSIS — I252 Old myocardial infarction: Secondary | ICD-10-CM | POA: Diagnosis not present

## 2019-10-19 DIAGNOSIS — I152 Hypertension secondary to endocrine disorders: Secondary | ICD-10-CM

## 2019-10-19 DIAGNOSIS — Z8673 Personal history of transient ischemic attack (TIA), and cerebral infarction without residual deficits: Secondary | ICD-10-CM

## 2019-10-19 DIAGNOSIS — G3184 Mild cognitive impairment, so stated: Secondary | ICD-10-CM

## 2019-10-19 MED ORDER — ESCITALOPRAM OXALATE 10 MG PO TABS
10.0000 mg | ORAL_TABLET | Freq: Every day | ORAL | 1 refills | Status: DC
Start: 2019-10-19 — End: 2019-10-24

## 2019-10-19 MED ORDER — METOPROLOL SUCCINATE ER 50 MG PO TB24
50.0000 mg | ORAL_TABLET | Freq: Every day | ORAL | 1 refills | Status: DC
Start: 2019-10-19 — End: 2020-03-28

## 2019-10-19 MED ORDER — METHOCARBAMOL 500 MG PO TABS: 500.0000 mg | ORAL_TABLET | Freq: Two times a day (BID) | ORAL | 3 refills | Status: DC | PRN

## 2019-10-19 NOTE — Patient Instructions (Addendum)
Please call Crystal (my nurse) at [phone] and leave a detailed message with the name and dose and instructions of the liquid medication (I think it's Prozac aka Fluoxetine but please confirm I cannot see where the hospital sent any liquid medicine, so I just want to confirm)   I initially prescribed Prozac LIQUID and the psychiatrist at the hospital ADDED the [place], please confirm what you are taking, one of these or both.    Do NOT need to take the Potassium Chloride or the Sodium Bicarbonate.   Labs to check kidneys and potassium advised - you have declined blood draw today! Please be aware we may be missing important information that will help me to safely manage your medications. If you are feeling ill, please come see me or one of my colleagues ASAP, and/or seek emergency medical attention if needed!

## 2019-10-19 NOTE — Progress Notes (Signed)
Damon Acosta is a 79 y.o. male who presents to  [place] at [facility]  today, 10/20/19, seeking care for the following:  . BP recheck on new Rx  BP Readings from Last 3 Encounters:  10/19/19 121/71  10/05/19 100/65  09/15/19 102/61    Elevated potassium levels on labs - went to ED, recheck was WNL, Creatinine was above baseline. He declines lab recheck today.   Mental health: See previous note, patient has some concerns that the "liquid medicine" caused his heart attack, I believe he is referring to the Prozac.  We had initially started liquid Prozac given his concerns that he would throw up pills, see previous notes, prior to hospitalization.  While he was in the hospital, psychiatry did, evaluate him, he was discharged on the liquid Prozac as well as pill of Lexapro, I assume psychiatry was aware of this plan and that the combination of SSRIs seemed to be helping him a lot so I decided to keep him on it.  He insists that there is a different liquid that the hospital put him on, I have asked him/his wife to confirm what ever he has at home, but I cannot tell from the records of anything that would be a liquid other than the Prozac.  He states he has not been taking this, poor historian.     ASSESSMENT & PLAN with other pertinent findings:  The primary encounter diagnosis was Severe episode of recurrent major depressive disorder, without psychotic features ([place]). Diagnoses of Spinal stenosis of lumbar region with neurogenic claudication, Type 2 diabetes mellitus with chronic kidney disease, without long-term current use of insulin, unspecified CKD stage ([place]), History of CVA (cerebrovascular accident), History of MI (myocardial infarction), Mild cognitive impairment, Stage 3 chronic kidney disease, unspecified whether stage 3a or 3b CKD, and Hypertension associated with diabetes ([place]) were also pertinent to this visit.   No results found for  this or any previous visit (from the past 24 hour(s)).   1. Spinal stenosis of lumbar region with neurogenic claudication stable  2. Severe episode of recurrent major depressive disorder, without psychotic features ([place]) Improved, meds are a bit confusing - need to confirm!   3. Type 2 diabetes mellitus with chronic kidney disease, without long-term current use of insulin, unspecified CKD stage ([place]) stable  4. History of CVA (cerebrovascular accident) stable  5. History of MI (myocardial infarction) Following w/ cardiology  6. Mild cognitive impairment Health literacy likely impacted - see above  7. Stage 3 chronic kidney disease, unspecified whether stage 3a or 3b CKD PATIENT REFUSED LAB REDRAW TODAY -I advised that this was not a good idea, we really need to be monitoring his kidney function given the medications he is taking and recent abnormal levels, he again declines.  Wife is with him, supports deferring labs for now.  8. Hypertension associated with diabetes ([place]) Improved, no chest pain/headache/vision change, no dizziness/lightheadedness.   Patient Instructions  Please call Crystal (my nurse) at [phone] and leave a detailed message with the name and dose and instructions of the liquid medication (I think it's Prozac aka Fluoxetine but please confirm I cannot see where the hospital sent any liquid medicine, so I just want to confirm)   I initially prescribed Prozac LIQUID and the psychiatrist at the hospital ADDED the [place], please confirm what you are taking, one of these or both.    Do NOT need to take the Potassium Chloride or  the Sodium Bicarbonate.   Labs to check kidneys and potassium advised - you have declined blood draw today! Please be aware we may be missing important information that will help me to safely manage your medications. If you are feeling ill, please come see me or one of my colleagues ASAP, and/or seek emergency medical attention if  needed!         No orders of the defined types were placed in this encounter.   Meds ordered this encounter  Medications  . methocarbamol (ROBAXIN) 500 MG tablet    Sig: Take 1 tablet (500 mg total) by mouth 2 (two) times daily as needed for muscle spasms.    Dispense:  90 tablet    Refill:  3  . escitalopram (LEXAPRO) 10 MG tablet    Sig: Take 1 tablet (10 mg total) by mouth daily.    Dispense:  90 tablet    Refill:  1  . metoprolol succinate (TOPROL-XL) 50 MG 24 hr tablet    Sig: Take 1 tablet (50 mg total) by mouth daily.    Dispense:  90 tablet    Refill:  1       Follow-up instructions: Return in about 3 months (around 01/18/2020) for MONITOR SUGARS, SEE ME SOONER IF NEEDED! .                                         BP 121/71 (BP Location: Left Arm, Patient Position: Sitting)   Pulse 90   Wt 193 lb (87.5 kg)   SpO2 100%   BMI 28.50 kg/m   Current Meds  Medication Sig  . acetaminophen (TYLENOL) 650 MG CR tablet Take 1-2 tablets (650-1,300 mg total) by mouth every 8 (eight) hours as needed for pain.  Marland Kitchen allopurinol (ZYLOPRIM) 300 MG tablet Take 1 tablet (300 mg total) by mouth daily.  Marland Kitchen apixaban (ELIQUIS) 2.5 MG TABS tablet Take 2.5 mg by mouth 2 (two) times daily.   Marland Kitchen atorvastatin (LIPITOR) 20 MG tablet Take 1 tablet (20 mg total) by mouth daily.  . clopidogrel (PLAVIX) 75 MG tablet Take 1 tablet (75 mg total) by mouth daily.  Marland Kitchen escitalopram (LEXAPRO) 10 MG tablet Take 1 tablet (10 mg total) by mouth daily.  Marland Kitchen FLUoxetine (PROZAC) 20 MG/5ML solution Take 5 mLs (20 mg total) by mouth daily.  . irbesartan (AVAPRO) 75 MG tablet Take 1 tablet (75 mg total) by mouth daily.  Marland Kitchen linagliptin (TRADJENTA) 5 MG TABS tablet Take 1 tablet (5 mg total) by mouth daily.  . metoprolol succinate (TOPROL-XL) 50 MG 24 hr tablet Take 1 tablet (50 mg total) by mouth daily.  . nitroGLYCERIN (NITROSTAT) 0.6 MG SL tablet Place 0.6 mg under the tongue  every 5 (five) minutes as needed for chest pain.  Marland Kitchen omeprazole (PRILOSEC) 20 MG capsule Take 1 capsule (20 mg total) by mouth daily.  Marland Kitchen oxybutynin (DITROPAN-XL) 10 MG 24 hr tablet Take 1 tablet (10 mg total) by mouth at bedtime.  . traMADol (ULTRAM) 50 MG tablet Take 1-2 tablets (50-100 mg total) by mouth every 8 (eight) hours as needed for moderate pain. Maximum 6 tabs per day.  . [DISCONTINUED] escitalopram (LEXAPRO) 10 MG tablet Take 1 tablet (10 mg total) by mouth daily.  . [DISCONTINUED] metoprolol succinate (TOPROL-XL) 50 MG 24 hr tablet Take 1 tablet (50 mg total) by mouth daily.    No  results found for this or any previous visit (from the past 37 hour(s)).  No results found.     All questions at time of visit were answered - patient instructed to contact office with any additional concerns or updates.  ER/RTC precautions were reviewed with the patient as applicable.   Please note: voice recognition software was used to produce this document, and typos may escape review. Please contact Dr. Sheppard Coil for any needed clarifications.   Total encounter time: 40 minutes.

## 2019-10-21 DIAGNOSIS — G4730 Sleep apnea, unspecified: Secondary | ICD-10-CM | POA: Diagnosis not present

## 2019-10-21 DIAGNOSIS — E785 Hyperlipidemia, unspecified: Secondary | ICD-10-CM | POA: Diagnosis not present

## 2019-10-21 DIAGNOSIS — Z933 Colostomy status: Secondary | ICD-10-CM | POA: Diagnosis not present

## 2019-10-21 DIAGNOSIS — Z7984 Long term (current) use of oral hypoglycemic drugs: Secondary | ICD-10-CM | POA: Diagnosis not present

## 2019-10-21 DIAGNOSIS — N1831 Chronic kidney disease, stage 3a: Secondary | ICD-10-CM | POA: Diagnosis not present

## 2019-10-21 DIAGNOSIS — M1990 Unspecified osteoarthritis, unspecified site: Secondary | ICD-10-CM | POA: Diagnosis not present

## 2019-10-21 DIAGNOSIS — D509 Iron deficiency anemia, unspecified: Secondary | ICD-10-CM | POA: Diagnosis not present

## 2019-10-21 DIAGNOSIS — I214 Non-ST elevation (NSTEMI) myocardial infarction: Secondary | ICD-10-CM | POA: Diagnosis not present

## 2019-10-21 DIAGNOSIS — K5981 Ogilvie syndrome: Secondary | ICD-10-CM | POA: Diagnosis not present

## 2019-10-21 DIAGNOSIS — F5105 Insomnia due to other mental disorder: Secondary | ICD-10-CM | POA: Diagnosis not present

## 2019-10-21 DIAGNOSIS — Z87891 Personal history of nicotine dependence: Secondary | ICD-10-CM | POA: Diagnosis not present

## 2019-10-21 DIAGNOSIS — F418 Other specified anxiety disorders: Secondary | ICD-10-CM | POA: Diagnosis not present

## 2019-10-21 DIAGNOSIS — M1030 Gout due to renal impairment, unspecified site: Secondary | ICD-10-CM | POA: Diagnosis not present

## 2019-10-21 DIAGNOSIS — N3281 Overactive bladder: Secondary | ICD-10-CM | POA: Diagnosis not present

## 2019-10-21 DIAGNOSIS — E875 Hyperkalemia: Secondary | ICD-10-CM | POA: Diagnosis not present

## 2019-10-21 DIAGNOSIS — Z7902 Long term (current) use of antithrombotics/antiplatelets: Secondary | ICD-10-CM | POA: Diagnosis not present

## 2019-10-21 DIAGNOSIS — I484 Atypical atrial flutter: Secondary | ICD-10-CM | POA: Diagnosis not present

## 2019-10-21 DIAGNOSIS — G3184 Mild cognitive impairment, so stated: Secondary | ICD-10-CM | POA: Diagnosis not present

## 2019-10-21 DIAGNOSIS — Z8673 Personal history of transient ischemic attack (TIA), and cerebral infarction without residual deficits: Secondary | ICD-10-CM | POA: Diagnosis not present

## 2019-10-21 DIAGNOSIS — I2510 Atherosclerotic heart disease of native coronary artery without angina pectoris: Secondary | ICD-10-CM | POA: Diagnosis not present

## 2019-10-21 DIAGNOSIS — I129 Hypertensive chronic kidney disease with stage 1 through stage 4 chronic kidney disease, or unspecified chronic kidney disease: Secondary | ICD-10-CM | POA: Diagnosis not present

## 2019-10-21 DIAGNOSIS — Z8546 Personal history of malignant neoplasm of prostate: Secondary | ICD-10-CM | POA: Diagnosis not present

## 2019-10-21 DIAGNOSIS — I252 Old myocardial infarction: Secondary | ICD-10-CM | POA: Insufficient documentation

## 2019-10-21 DIAGNOSIS — I480 Paroxysmal atrial fibrillation: Secondary | ICD-10-CM | POA: Diagnosis not present

## 2019-10-21 DIAGNOSIS — E1122 Type 2 diabetes mellitus with diabetic chronic kidney disease: Secondary | ICD-10-CM | POA: Diagnosis not present

## 2019-10-21 DIAGNOSIS — F322 Major depressive disorder, single episode, severe without psychotic features: Secondary | ICD-10-CM | POA: Diagnosis not present

## 2019-10-24 ENCOUNTER — Other Ambulatory Visit: Payer: Self-pay

## 2019-10-24 ENCOUNTER — Telehealth: Payer: Self-pay | Admitting: Osteopathic Medicine

## 2019-10-24 DIAGNOSIS — R4586 Emotional lability: Secondary | ICD-10-CM

## 2019-10-24 DIAGNOSIS — Z8673 Personal history of transient ischemic attack (TIA), and cerebral infarction without residual deficits: Secondary | ICD-10-CM

## 2019-10-24 MED ORDER — APIXABAN 2.5 MG PO TABS: 2.5000 mg | ORAL_TABLET | Freq: Two times a day (BID) | ORAL | 0 refills | Status: DC

## 2019-10-24 MED ORDER — ESCITALOPRAM OXALATE 10 MG PO TABS: 10.0000 mg | ORAL_TABLET | Freq: Every day | ORAL | 1 refills | Status: DC

## 2019-10-24 NOTE — Telephone Encounter (Signed)
Patient contacted, medication rx sent to [facility]

## 2019-10-24 NOTE — Telephone Encounter (Signed)
Patient stopped by, stating that he REALLY needed the two medications listed below called in, for today, since he is completely out. He also said he did check with his pharmacy prior to checking with [name], and neither one of them was there at the pharmacy. Please Advise.  * apixaban (ELIQUIS) 2.5 MG TABS tablet       *   Escitalopram Oxalate Oral

## 2019-10-25 DIAGNOSIS — I484 Atypical atrial flutter: Secondary | ICD-10-CM | POA: Diagnosis not present

## 2019-10-25 DIAGNOSIS — I2510 Atherosclerotic heart disease of native coronary artery without angina pectoris: Secondary | ICD-10-CM | POA: Diagnosis not present

## 2019-10-25 DIAGNOSIS — F322 Major depressive disorder, single episode, severe without psychotic features: Secondary | ICD-10-CM | POA: Diagnosis not present

## 2019-10-25 DIAGNOSIS — I129 Hypertensive chronic kidney disease with stage 1 through stage 4 chronic kidney disease, or unspecified chronic kidney disease: Secondary | ICD-10-CM | POA: Diagnosis not present

## 2019-10-25 DIAGNOSIS — I480 Paroxysmal atrial fibrillation: Secondary | ICD-10-CM | POA: Diagnosis not present

## 2019-10-25 DIAGNOSIS — I214 Non-ST elevation (NSTEMI) myocardial infarction: Secondary | ICD-10-CM | POA: Diagnosis not present

## 2019-10-27 ENCOUNTER — Other Ambulatory Visit: Payer: Self-pay

## 2019-10-27 ENCOUNTER — Ambulatory Visit (INDEPENDENT_AMBULATORY_CARE_PROVIDER_SITE_OTHER): Payer: Medicare Other

## 2019-10-27 ENCOUNTER — Ambulatory Visit (INDEPENDENT_AMBULATORY_CARE_PROVIDER_SITE_OTHER): Payer: Medicare Other | Admitting: Osteopathic Medicine

## 2019-10-27 DIAGNOSIS — I10 Essential (primary) hypertension: Secondary | ICD-10-CM

## 2019-10-27 DIAGNOSIS — E785 Hyperlipidemia, unspecified: Secondary | ICD-10-CM

## 2019-10-27 DIAGNOSIS — M25571 Pain in right ankle and joints of right foot: Secondary | ICD-10-CM | POA: Diagnosis not present

## 2019-10-27 DIAGNOSIS — N183 Chronic kidney disease, stage 3 unspecified: Secondary | ICD-10-CM

## 2019-10-27 DIAGNOSIS — E1169 Type 2 diabetes mellitus with other specified complication: Secondary | ICD-10-CM | POA: Diagnosis not present

## 2019-10-27 DIAGNOSIS — F3341 Major depressive disorder, recurrent, in partial remission: Secondary | ICD-10-CM

## 2019-10-27 DIAGNOSIS — E1159 Type 2 diabetes mellitus with other circulatory complications: Secondary | ICD-10-CM | POA: Diagnosis not present

## 2019-10-27 DIAGNOSIS — I252 Old myocardial infarction: Secondary | ICD-10-CM

## 2019-10-27 DIAGNOSIS — I152 Hypertension secondary to endocrine disorders: Secondary | ICD-10-CM

## 2019-10-27 DIAGNOSIS — Z8673 Personal history of transient ischemic attack (TIA), and cerebral infarction without residual deficits: Secondary | ICD-10-CM

## 2019-10-27 MED ORDER — IRBESARTAN 75 MG PO TABS: 75.0000 mg | ORAL_TABLET | Freq: Every day | ORAL | 3 refills | Status: DC

## 2019-10-27 MED ORDER — ESCITALOPRAM OXALATE 5 MG/5ML PO SOLN: 20.0000 mg | Freq: Every day | ORAL | 12 refills | Status: DC

## 2019-10-27 NOTE — Progress Notes (Signed)
HPI: Damon Acosta is a 79 y.o. 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  he presents to [facility] today, 10/27/19,  for chief complaint of: Medication reconciliation  Patient presented to clinic yesterday requesting a refill of a "liquid medication," but insisted the only liquid medication we had on file was not the correct one, so he was asked to come into the office to review medications. He has had multiple hospitalizations in the past few weeks with multiple medication changes. He states the medication he needs refilled was prescribed by a psychiatrist in the hospital. He was previously taking 10mg  liquid prozac and escitalopram 10mg  tablets. He states he told the psychiatrist that the liquid prozac burned his throat and made him nauseous, so the psychiatrist gave him the liquid escitalopram instead. Per records review, patient's most recent hospital discharge med list included escitalopram tablets, but no liquid medications were noted on discharge instructions.  Patient also notes he recently fell during PT and twisted his right ankle. He notes swelling and pain to the outer right ankle. He is able to walk and bear weight.  Patient is accompanied by his wife who assists with history-taking.   Past medical, surgical, social and family history reviewed:  Patient Active Problem List   Diagnosis Date Noted  . History of MI (myocardial infarction) 10/21/2019  . Asymptomatic PVCs 07/27/2018  . Melena 04/27/2018  . CKD (chronic kidney disease) stage 3, GFR 30-59 ml/min 04/27/2018  . Mild cognitive impairment 04/20/2018  . Microcytic anemia 04/20/2018  . Primary osteoarthritis of both knees 02/17/2018  .  Insomnia due to anxiety and fear 01/26/2018  . Corn of left foot, plantar fifth MTP 01/26/2018  . 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 Type 2 diabetes mellitus with chronic kidney disease, without long-term current use of insulin ([place]) 01/06/2018  . Hyperkalemia 01/06/2018  . Elevated serum creatinine 01/06/2018  . History of gout 01/06/2018  . Dyslipidemia associated with type 2 diabetes mellitus ([place]) 01/06/2018  . Dyspepsia 01/06/2018  . OAB (overactive bladder) 01/06/2018  . History of prostate cancer 01/06/2018  . Current moderate episode of major depressive disorder without prior episode ([place]) 01/06/2018  . Anxiety about health 01/06/2018  . Lumbar spinal stenosis 12/15/2017  . Colostomy status ([place]) 12/15/2017  . Hyperlipidemia 12/15/2017  . Hypertension associated with diabetes ([place]) 12/15/2017  . Gout 12/15/2017  . History of CVA (cerebrovascular accident) 12/15/2017    Past Surgical History:  Procedure Laterality Date  . CHOLECYSTECTOMY    . COLOSTOMY    . PROSTATE SURGERY      Social History   Tobacco Use  . Smoking status: Former Research scientist (life sciences)  . Smokeless tobacco: Never Used  Substance Use Topics  . Alcohol use: Not Currently    Family History  Family history unknown: Yes     Current medication list and allergy/intolerance information reviewed:    Current Outpatient Medications  Medication Sig Dispense Refill  . acetaminophen (TYLENOL) 650 MG CR tablet Take 1-2 tablets (650-1,300 mg total) by mouth every 8 (eight) hours as needed for pain. 90 tablet 3  . allopurinol (ZYLOPRIM) 300 MG tablet Take 1 tablet (300 mg total) by mouth daily. 90 tablet 3  . apixaban (ELIQUIS) 2.5 MG TABS tablet Take 1 tablet (2.5 mg total) by  mouth 2 (two) times daily. 60 tablet 0  . atorvastatin (LIPITOR) 20 MG tablet Take 1 tablet (20 mg total) by mouth daily. 90 tablet 3  . clopidogrel (PLAVIX) 75 MG tablet Take 1 tablet (75 mg total)  by mouth daily. 90 tablet 3  . irbesartan (AVAPRO) 75 MG tablet Take 1 tablet (75 mg total) by mouth daily. 90 tablet 3  . linagliptin (TRADJENTA) 5 MG TABS tablet Take 1 tablet (5 mg total) by mouth daily. 90 tablet 1  . methocarbamol (ROBAXIN) 500 MG tablet Take 1 tablet (500 mg total) by mouth 2 (two) times daily as needed for muscle spasms. 90 tablet 3  . metoprolol succinate (TOPROL-XL) 50 MG 24 hr tablet Take 1 tablet (50 mg total) by mouth daily. 90 tablet 1  . nitroGLYCERIN (NITROSTAT) 0.6 MG SL tablet Place 0.6 mg under the tongue every 5 (five) minutes as needed for chest pain.    Marland Kitchen oxybutynin (DITROPAN-XL) 10 MG 24 hr tablet Take 1 tablet (10 mg total) by mouth at bedtime. 90 tablet 3  . escitalopram (LEXAPRO) 5 MG/5ML solution Take 20 mLs (20 mg total) by mouth daily. 240 mL 12   No current facility-administered medications for this visit.    Allergies  Allergen Reactions  . Aspirin Swelling  . Shellfish Allergy   . Doxepin Other (See Comments)    Dry mouth  . Sertraline Diarrhea      Review of Systems:  Constitutional:  No  fever, no chills  HEENT: No  headache, no vision change  Cardiac: No  chest pain  Respiratory:  No  shortness of breath. No  Cough  Gastrointestinal: No  abdominal pain, No  nausea, No  vomiting  Musculoskeletal: + right foot/ankle pain  Skin: No  Rash, No other wounds/concerning lesions   Neurologic: No  weakness, No  dizziness  Psychiatric: No  concerns with depression  Exam:   Constitutional: VS see above. General Appearance: alert, well-developed, well-nourished, NAD  Respiratory: Normal respiratory effort.  Gastrointestinal: Nontender, no masses.   Musculoskeletal: Edema noted to right lateral malleolus with ecchymosis. Tenderness to palpation over proximal 5th metatarsal. Gait normal with standing rollater use. No clubbing/cyanosis of digits.   Neurological: Normal balance/coordination with standing rollater use. No tremor. No  cranial nerve deficit on limited exam.   Skin: warm, dry, intact. No rash/ulcer  Psychiatric: Normal judgment/insight. Normal mood and affect. Oriented x3.    No results found for this or any previous visit (from the past 72 hour(s)).  No results found.   ASSESSMENT/PLAN: The primary encounter diagnosis was History of CVA (cerebrovascular accident). Diagnoses of Hypertension associated with diabetes ([place]), History of MI (myocardial infarction), Recurrent major depressive disorder, in partial remission ([place]), Dyslipidemia associated with type 2 diabetes mellitus ([place]), and Stage 3 chronic kidney disease, unspecified whether stage 3a or 3b CKD were also pertinent to this visit.    Medication reconciliation  Pt brought all home medications to office with him. Liquid medication he requested refill for is escitalopram 5mg /35mL, prescribed by Nita Sells. This medication was not present on patient's med list at discharge from either hospitalization.  Prescribed escitalopram 5mg /17mL liquid to take 20mg  daily. Prozac was discontinued by patient during hospitalization. Escitalopram tablets were discontinued today in favor of the liquid.  Pt's other medications were also reviewed, as he had multiple prescriptions for oxybutinin, metoprolol, and others and was taking multiple doses of these medications as a result. Prescriptions that did not match patient's current doses were  disposed of to decreas confusion and avoid polypharmacy complications. Medications that required refills/different dosing were sent to pharmacy.   New, accurate med list was printed for patient. Old med lists that he physically had on him were thrown away also. Patient was instructed to keep updated med list in his wallet and review any changes on discharge if/when future hospitalizations or ER visits.   Right ankle/foot pain  Low suspicion for fracture given ability to walk and bear weight.  However, given tenderness over 5th  metatarsal, ordered XR to r/o fracture. Advised rest, ice, compression. ACE artap provided.   Orders Placed This Encounter  Procedures  . DG Ankle Complete Right    Meds ordered this encounter  Medications  . irbesartan (AVAPRO) 75 MG tablet    Sig: Take 1 tablet (75 mg total) by mouth daily.    Dispense:  90 tablet    Refill:  3  . escitalopram (LEXAPRO) 5 MG/5ML solution    Sig: Take 20 mLs (20 mg total) by mouth daily.    Dispense:  240 mL    Refill:  12      Visit summary with medication list and pertinent instructions was printed for patient to review. All questions at time of visit were answered - patient instructed to contact office with any additional concerns or updates. ER/RTC precautions were reviewed with the patient.   Note: Total time spent 40 minutes, greater than 50% of the visit was spent face-to-face counseling and coordinating care for the above diagnoses listed in assessment/plan.   Please note: voice recognition software was used to produce this document, and typos may escape review. Please contact Dr. Sheppard Coil for any needed clarifications.     Follow-up plan: Return in about 3 months (around 01/26/2020) for routine check-up .Reminder set to call on Monday to check BP   Gweneth Dimitri, [facility] MS3

## 2019-10-31 ENCOUNTER — Other Ambulatory Visit: Payer: Self-pay | Admitting: Osteopathic Medicine

## 2019-11-02 DIAGNOSIS — I214 Non-ST elevation (NSTEMI) myocardial infarction: Secondary | ICD-10-CM | POA: Diagnosis not present

## 2019-11-02 DIAGNOSIS — I484 Atypical atrial flutter: Secondary | ICD-10-CM | POA: Diagnosis not present

## 2019-11-02 DIAGNOSIS — I129 Hypertensive chronic kidney disease with stage 1 through stage 4 chronic kidney disease, or unspecified chronic kidney disease: Secondary | ICD-10-CM | POA: Diagnosis not present

## 2019-11-02 DIAGNOSIS — I2510 Atherosclerotic heart disease of native coronary artery without angina pectoris: Secondary | ICD-10-CM | POA: Diagnosis not present

## 2019-11-02 DIAGNOSIS — F322 Major depressive disorder, single episode, severe without psychotic features: Secondary | ICD-10-CM | POA: Diagnosis not present

## 2019-11-02 DIAGNOSIS — I480 Paroxysmal atrial fibrillation: Secondary | ICD-10-CM | POA: Diagnosis not present

## 2019-11-08 DIAGNOSIS — I484 Atypical atrial flutter: Secondary | ICD-10-CM | POA: Diagnosis not present

## 2019-11-08 DIAGNOSIS — F322 Major depressive disorder, single episode, severe without psychotic features: Secondary | ICD-10-CM | POA: Diagnosis not present

## 2019-11-08 DIAGNOSIS — I480 Paroxysmal atrial fibrillation: Secondary | ICD-10-CM | POA: Diagnosis not present

## 2019-11-08 DIAGNOSIS — I129 Hypertensive chronic kidney disease with stage 1 through stage 4 chronic kidney disease, or unspecified chronic kidney disease: Secondary | ICD-10-CM | POA: Diagnosis not present

## 2019-11-08 DIAGNOSIS — I214 Non-ST elevation (NSTEMI) myocardial infarction: Secondary | ICD-10-CM | POA: Diagnosis not present

## 2019-11-08 DIAGNOSIS — I2510 Atherosclerotic heart disease of native coronary artery without angina pectoris: Secondary | ICD-10-CM | POA: Diagnosis not present

## 2019-11-15 DIAGNOSIS — F322 Major depressive disorder, single episode, severe without psychotic features: Secondary | ICD-10-CM | POA: Diagnosis not present

## 2019-11-15 DIAGNOSIS — I214 Non-ST elevation (NSTEMI) myocardial infarction: Secondary | ICD-10-CM | POA: Diagnosis not present

## 2019-11-15 DIAGNOSIS — I480 Paroxysmal atrial fibrillation: Secondary | ICD-10-CM | POA: Diagnosis not present

## 2019-11-15 DIAGNOSIS — I2510 Atherosclerotic heart disease of native coronary artery without angina pectoris: Secondary | ICD-10-CM | POA: Diagnosis not present

## 2019-11-15 DIAGNOSIS — I484 Atypical atrial flutter: Secondary | ICD-10-CM | POA: Diagnosis not present

## 2019-11-15 DIAGNOSIS — I129 Hypertensive chronic kidney disease with stage 1 through stage 4 chronic kidney disease, or unspecified chronic kidney disease: Secondary | ICD-10-CM | POA: Diagnosis not present

## 2019-11-17 ENCOUNTER — Encounter: Payer: Self-pay | Admitting: Osteopathic Medicine

## 2019-11-17 ENCOUNTER — Other Ambulatory Visit: Payer: Self-pay

## 2019-11-17 ENCOUNTER — Ambulatory Visit (INDEPENDENT_AMBULATORY_CARE_PROVIDER_SITE_OTHER): Payer: Medicare Other | Admitting: Osteopathic Medicine

## 2019-11-17 VITALS — BP 130/83 | HR 80 | Wt 190.0 lb

## 2019-11-17 DIAGNOSIS — Z23 Encounter for immunization: Secondary | ICD-10-CM

## 2019-11-17 DIAGNOSIS — M48062 Spinal stenosis, lumbar region with neurogenic claudication: Secondary | ICD-10-CM

## 2019-11-17 DIAGNOSIS — E1122 Type 2 diabetes mellitus with diabetic chronic kidney disease: Secondary | ICD-10-CM

## 2019-11-17 DIAGNOSIS — N1831 Chronic kidney disease, stage 3a: Secondary | ICD-10-CM | POA: Diagnosis not present

## 2019-11-17 LAB — POCT GLYCOSYLATED HEMOGLOBIN (HGB A1C)
HbA1c POC (<> result, manual entry): 6 % (ref 4.0–5.6)
HbA1c, POC (controlled diabetic range): 6 % (ref 0.0–7.0)
HbA1c, POC (prediabetic range): 6 % (ref 5.7–6.4)
Hemoglobin A1C: 6 % — AB (ref 4.0–5.6)

## 2019-11-17 MED ORDER — TRAMADOL HCL 50 MG PO TABS: 50.0000 mg | ORAL_TABLET | Freq: Three times a day (TID) | ORAL | 0 refills | Status: DC | PRN

## 2019-11-17 NOTE — Progress Notes (Signed)
Damon Acosta is a 79 y.o. male who presents to  [facility]  today, 11/17/19, seeking care for the following:  . DM2 monitoring      ASSESSMENT & PLAN with other pertinent findings:  The primary encounter diagnosis was Flu vaccine need. Diagnoses of Type 2 diabetes mellitus with stage 3a chronic kidney disease, without long-term current use of insulin ([place]) and Spinal stenosis of lumbar region with neurogenic claudication were also pertinent to this visit.   Results for orders placed or performed in visit on 11/17/19 (from the past 24 hour(s))  POCT HgB A1C     Status: Abnormal   Collection Time: 11/17/19  1:23 PM  Result Value Ref Range   Hemoglobin A1C 6.0 (A) 4.0 - 5.6 %   HbA1c POC (<> result, manual entry) 6.0 4.0 - 5.6 %   HbA1c, POC (prediabetic range) 6.0 5.7 - 6.4 %   HbA1c, POC (controlled diabetic range) 6.0 0.0 - 7.0 %    1. Flu vaccine need Done today   2. Type 2 diabetes mellitus with chronic kidney disease, without long-term current use of insulin, unspecified CKD stage ([name]) Stable / at goal, no hypoglycemia, ok to moniotir q 6 mos      There are no Patient Instructions on file for this visit.  Orders Placed This Encounter  Procedures  . Flu Vaccine QUAD High Dose(Fluad)  . POCT HgB A1C    Meds ordered this encounter  Medications  . traMADol (ULTRAM) 50 MG tablet    Sig: Take 1 tablet (50 mg total) by mouth every 8 (eight) hours as needed for severe pain.    Dispense:  90 tablet    Refill:  0       Follow-up instructions: Return in about 6 months (around 05/17/2020) for ANNUAL (call week prior to visit for lab orders).                                         BP 130/83 (BP Location: Left Arm, Patient Position: Sitting)   Pulse 80   Wt 190 lb (86.2 kg)   SpO2 100%   BMI 28.06 kg/m   Current Meds  Medication Sig  . acetaminophen (TYLENOL) 650 MG  CR tablet Take 1-2 tablets (650-1,300 mg total) by mouth every 8 (eight) hours as needed for pain.  Marland Kitchen allopurinol (ZYLOPRIM) 300 MG tablet Take 1 tablet (300 mg total) by mouth daily.  Marland Kitchen apixaban (ELIQUIS) 2.5 MG TABS tablet Take 1 tablet (2.5 mg total) by mouth 2 (two) times daily.  Marland Kitchen atorvastatin (LIPITOR) 20 MG tablet Take 1 tablet (20 mg total) by mouth daily.  . clopidogrel (PLAVIX) 75 MG tablet Take 1 tablet (75 mg total) by mouth daily.  Marland Kitchen escitalopram (LEXAPRO) 5 MG/5ML solution Take 20 mLs (20 mg total) by mouth daily.  . irbesartan (AVAPRO) 75 MG tablet Take 1 tablet (75 mg total) by mouth daily.  Marland Kitchen linagliptin (TRADJENTA) 5 MG TABS tablet Take 1 tablet (5 mg total) by mouth daily.  . methocarbamol (ROBAXIN) 500 MG tablet Take 1 tablet (500 mg total) by mouth 2 (two) times daily as needed for muscle spasms.  . metoprolol succinate (TOPROL-XL) 50 MG 24 hr tablet Take 1 tablet (50 mg total) by mouth daily.  . nitroGLYCERIN (NITROSTAT) 0.6 MG SL tablet Place 0.6 mg under the  tongue every 5 (five) minutes as needed for chest pain.  Marland Kitchen oxybutynin (DITROPAN-XL) 10 MG 24 hr tablet Take 1 tablet (10 mg total) by mouth at bedtime.    Results for orders placed or performed in visit on 11/17/19 (from the past 72 hour(s))  POCT HgB A1C     Status: Abnormal   Collection Time: 11/17/19  1:23 PM  Result Value Ref Range   Hemoglobin A1C 6.0 (A) 4.0 - 5.6 %   HbA1c POC (<> result, manual entry) 6.0 4.0 - 5.6 %   HbA1c, POC (prediabetic range) 6.0 5.7 - 6.4 %   HbA1c, POC (controlled diabetic range) 6.0 0.0 - 7.0 %    No results found.      All questions at time of visit were answered - patient instructed to contact office with any additional concerns or updates.  ER/RTC precautions were reviewed with the patient as applicable.   Please note: voice recognition software was used to produce this document, and typos may escape review. Please contact Dr. Sheppard Coil for any needed clarifications.

## 2019-11-19 ENCOUNTER — Other Ambulatory Visit: Payer: Self-pay | Admitting: Osteopathic Medicine

## 2019-11-25 ENCOUNTER — Other Ambulatory Visit: Payer: Self-pay | Admitting: Osteopathic Medicine

## 2019-12-03 ENCOUNTER — Other Ambulatory Visit: Payer: Self-pay | Admitting: Osteopathic Medicine

## 2019-12-03 DIAGNOSIS — Z8673 Personal history of transient ischemic attack (TIA), and cerebral infarction without residual deficits: Secondary | ICD-10-CM

## 2019-12-05 ENCOUNTER — Other Ambulatory Visit: Payer: Self-pay

## 2019-12-05 DIAGNOSIS — Z8673 Personal history of transient ischemic attack (TIA), and cerebral infarction without residual deficits: Secondary | ICD-10-CM

## 2019-12-05 MED ORDER — APIXABAN 2.5 MG PO TABS: 2.5000 mg | ORAL_TABLET | Freq: Two times a day (BID) | ORAL | 3 refills | Status: DC

## 2019-12-16 ENCOUNTER — Other Ambulatory Visit: Payer: Self-pay | Admitting: Osteopathic Medicine

## 2019-12-16 DIAGNOSIS — Z96643 Presence of artificial hip joint, bilateral: Secondary | ICD-10-CM | POA: Diagnosis not present

## 2019-12-16 DIAGNOSIS — Z87891 Personal history of nicotine dependence: Secondary | ICD-10-CM | POA: Diagnosis not present

## 2019-12-16 DIAGNOSIS — Z7984 Long term (current) use of oral hypoglycemic drugs: Secondary | ICD-10-CM | POA: Diagnosis not present

## 2019-12-16 DIAGNOSIS — R519 Headache, unspecified: Secondary | ICD-10-CM | POA: Diagnosis not present

## 2019-12-16 DIAGNOSIS — N183 Chronic kidney disease, stage 3 unspecified: Secondary | ICD-10-CM | POA: Diagnosis not present

## 2019-12-16 DIAGNOSIS — M25551 Pain in right hip: Secondary | ICD-10-CM | POA: Diagnosis not present

## 2019-12-16 DIAGNOSIS — E785 Hyperlipidemia, unspecified: Secondary | ICD-10-CM | POA: Diagnosis not present

## 2019-12-16 DIAGNOSIS — Z8673 Personal history of transient ischemic attack (TIA), and cerebral infarction without residual deficits: Secondary | ICD-10-CM | POA: Diagnosis not present

## 2019-12-16 DIAGNOSIS — G8911 Acute pain due to trauma: Secondary | ICD-10-CM | POA: Diagnosis not present

## 2019-12-16 DIAGNOSIS — Z8546 Personal history of malignant neoplasm of prostate: Secondary | ICD-10-CM | POA: Diagnosis not present

## 2019-12-16 DIAGNOSIS — S7001XA Contusion of right hip, initial encounter: Secondary | ICD-10-CM | POA: Diagnosis not present

## 2019-12-16 DIAGNOSIS — Z91013 Allergy to seafood: Secondary | ICD-10-CM | POA: Diagnosis not present

## 2019-12-16 DIAGNOSIS — Z043 Encounter for examination and observation following other accident: Secondary | ICD-10-CM | POA: Diagnosis not present

## 2019-12-16 DIAGNOSIS — S79911A Unspecified injury of right hip, initial encounter: Secondary | ICD-10-CM | POA: Diagnosis not present

## 2019-12-16 DIAGNOSIS — Z79899 Other long term (current) drug therapy: Secondary | ICD-10-CM | POA: Diagnosis not present

## 2019-12-16 DIAGNOSIS — I129 Hypertensive chronic kidney disease with stage 1 through stage 4 chronic kidney disease, or unspecified chronic kidney disease: Secondary | ICD-10-CM | POA: Diagnosis not present

## 2019-12-16 DIAGNOSIS — M542 Cervicalgia: Secondary | ICD-10-CM | POA: Diagnosis not present

## 2019-12-16 DIAGNOSIS — Z7901 Long term (current) use of anticoagulants: Secondary | ICD-10-CM | POA: Diagnosis not present

## 2019-12-16 DIAGNOSIS — Z9989 Dependence on other enabling machines and devices: Secondary | ICD-10-CM | POA: Diagnosis not present

## 2019-12-16 DIAGNOSIS — E1122 Type 2 diabetes mellitus with diabetic chronic kidney disease: Secondary | ICD-10-CM | POA: Diagnosis not present

## 2019-12-16 DIAGNOSIS — Z933 Colostomy status: Secondary | ICD-10-CM | POA: Diagnosis not present

## 2019-12-16 DIAGNOSIS — Z888 Allergy status to other drugs, medicaments and biological substances status: Secondary | ICD-10-CM | POA: Diagnosis not present

## 2019-12-26 DIAGNOSIS — Z23 Encounter for immunization: Secondary | ICD-10-CM | POA: Diagnosis not present

## 2019-12-27 DIAGNOSIS — M5416 Radiculopathy, lumbar region: Secondary | ICD-10-CM | POA: Diagnosis not present

## 2020-01-15 ENCOUNTER — Other Ambulatory Visit: Payer: Self-pay | Admitting: Osteopathic Medicine

## 2020-01-18 ENCOUNTER — Other Ambulatory Visit: Payer: Self-pay

## 2020-01-18 ENCOUNTER — Ambulatory Visit (INDEPENDENT_AMBULATORY_CARE_PROVIDER_SITE_OTHER): Payer: Medicare Other | Admitting: Osteopathic Medicine

## 2020-01-18 ENCOUNTER — Encounter: Payer: Self-pay | Admitting: Osteopathic Medicine

## 2020-01-18 VITALS — BP 135/81 | HR 73 | Temp 98.1°F | Wt 191.1 lb

## 2020-01-18 DIAGNOSIS — M48062 Spinal stenosis, lumbar region with neurogenic claudication: Secondary | ICD-10-CM

## 2020-01-18 DIAGNOSIS — R0981 Nasal congestion: Secondary | ICD-10-CM

## 2020-01-18 MED ORDER — FLUTICASONE PROPIONATE 50 MCG/ACT NA SUSP: NASAL | 3 refills | Status: DC

## 2020-01-18 MED ORDER — TRAMADOL HCL 50 MG PO TABS
50.0000 mg | ORAL_TABLET | Freq: Three times a day (TID) | ORAL | 0 refills | Status: DC | PRN
Start: 2020-01-18 — End: 2020-04-09

## 2020-01-18 NOTE — Progress Notes (Signed)
Damon Acosta is a 79 y.o. male who presents to  [place] at [facility]  today, 01/18/20, seeking care for the following:  . Sinuses stuffed up - waking up daily with hoarseness and stuffy nose. Taking allegra qhs. No cough/SOB . Chronic back pain - has not heard back from his orthopedic team, records reviewed 12/27/19 visit, pain worse after recent fall. Rx Gabapentin. Planning for MRI but pt apparently unable to do MRI d/t leg twitching even w/ benzos.      ASSESSMENT & PLAN with other pertinent findings:  The primary encounter diagnosis was Sinus congestion. A diagnosis of Spinal stenosis of lumbar region with neurogenic claudication was also pertinent to this visit.   1. Sinus congestion Education on proper instillation of fluticasone nasal spray, take this in addition to allegra, let me know if no improvement in 2-3 weeks  2. Spinal stenosis of lumbar region with neurogenic claudication Follow w/ ortho on this  PDMP reviewed Refilled Tramadol, Dr T (usual prescriber) is out of the office   No results found for this or any previous visit (from the past 24 hour(s)).  There are no Patient Instructions on file for this visit.  No orders of the defined types were placed in this encounter.   Meds ordered this encounter  Medications  . traMADol (ULTRAM) 50 MG tablet    Sig: Take 1 tablet (50 mg total) by mouth every 8 (eight) hours as needed for severe pain.    Dispense:  90 tablet    Refill:  0  . fluticasone (FLONASE) 50 MCG/ACT nasal spray    Sig: One spray in each nostril twice a day, use left hand for right nostril, and right hand for left nostril.    Dispense:  48 g    Refill:  3       Follow-up instructions: Return for [place].                                         BP 135/81 (BP Location: Left Arm, Patient Position: Sitting, Cuff Size: Normal)    Pulse 73   Temp 98.1 F (36.7 C) (Oral)   Wt 191 lb 1.9 oz (86.7 kg)   BMI 28.22 kg/m   Current Meds  Medication Sig  . acetaminophen (TYLENOL) 650 MG CR tablet Take 1-2 tablets (650-1,300 mg total) by mouth every 8 (eight) hours as needed for pain.  Marland Kitchen allopurinol (ZYLOPRIM) 300 MG tablet Take 1 tablet (300 mg total) by mouth daily.  Marland Kitchen apixaban (ELIQUIS) 2.5 MG TABS tablet Take 1 tablet (2.5 mg total) by mouth 2 (two) times daily.  Marland Kitchen atorvastatin (LIPITOR) 20 MG tablet Take 1 tablet (20 mg total) by mouth daily.  . clopidogrel (PLAVIX) 75 MG tablet Take 1 tablet (75 mg total) by mouth daily.  Marland Kitchen escitalopram (LEXAPRO) 5 MG/5ML solution TAKE 20 MLS (20 MG TOTAL) BY MOUTH DAILY.  Marland Kitchen irbesartan (AVAPRO) 75 MG tablet Take 1 tablet (75 mg total) by mouth daily.  Marland Kitchen linagliptin (TRADJENTA) 5 MG TABS tablet Take 1 tablet (5 mg total) by mouth daily.  . methocarbamol (ROBAXIN) 500 MG tablet Take 1 tablet (500 mg total) by mouth 2 (two) times daily as needed for muscle spasms.  . metoprolol succinate (TOPROL-XL) 50 MG 24 hr tablet Take 1 tablet (50 mg total) by mouth daily.  Marland Kitchen  nitroGLYCERIN (NITROSTAT) 0.6 MG SL tablet Place 0.6 mg under the tongue every 5 (five) minutes as needed for chest pain.  Marland Kitchen oxybutynin (DITROPAN-XL) 10 MG 24 hr tablet Take 1 tablet (10 mg total) by mouth at bedtime.  . traMADol (ULTRAM) 50 MG tablet Take 1 tablet (50 mg total) by mouth every 8 (eight) hours as needed for severe pain.  . [DISCONTINUED] traMADol (ULTRAM) 50 MG tablet Take 1 tablet (50 mg total) by mouth every 8 (eight) hours as needed for severe pain.    No results found for this or any previous visit (from the past 72 hour(s)).  No results found.     All questions at time of visit were answered - patient instructed to contact office with any additional concerns or updates.  ER/RTC precautions were reviewed with the patient as applicable.   Please note: voice recognition software was used to produce this  document, and typos may escape review. Please contact Dr. Sheppard Coil for any needed clarifications

## 2020-02-07 ENCOUNTER — Other Ambulatory Visit: Payer: Self-pay

## 2020-02-07 ENCOUNTER — Encounter: Payer: Self-pay | Admitting: Osteopathic Medicine

## 2020-02-07 ENCOUNTER — Ambulatory Visit (INDEPENDENT_AMBULATORY_CARE_PROVIDER_SITE_OTHER): Payer: Medicare Other | Admitting: Osteopathic Medicine

## 2020-02-07 VITALS — BP 118/65 | HR 76 | Temp 97.8°F | Wt 191.2 lb

## 2020-02-07 DIAGNOSIS — I7300 Raynaud's syndrome without gangrene: Secondary | ICD-10-CM | POA: Diagnosis not present

## 2020-02-07 DIAGNOSIS — R490 Dysphonia: Secondary | ICD-10-CM | POA: Diagnosis not present

## 2020-02-07 NOTE — Progress Notes (Signed)
Damon Acosta is a 79 y.o. male who presents to  [place] at [facility]  today, 02/07/20, seeking care for the following:  . Concern for tips of his fingers turning pale in th cold or spontaneously, associated w/ numbness/tingling at fingertips. Ongoing maybe a month.  . Concern for hoarseness. He seems to recall ?laryngoscopy while hospitalized for ACS/CAD 09/2019. I cannot find records of this test if it was done. Very remote smoking history qui in 1970's, no occupational respiratory exposure or secondhand smoke exposure. No difficulty swallowing.      ASSESSMENT & PLAN with other pertinent findings:  The primary encounter diagnosis was Raynaud's disease without gangrene. A diagnosis of Hoarseness was also pertinent to this visit.   --> w/u for underlying inflammatory causes raynaud's --> refer ENT for hoarseness, hx CVA, remote hx smoker, no swallowing difficulty, suspect he may need laryngoscopy vs whatever else ENT advises  Results for orders placed or performed in visit on 02/07/20 (from the past 24 hour(s))  Urinalysis     Status: None   Collection Time: 02/07/20 11:13 AM  Result Value Ref Range   Color, Urine YELLOW YELLOW   APPearance CLEAR CLEAR   Specific Gravity, Urine 1.018 1.001 - 1.03   pH < OR = 5.0 5.0 - 8.0   Glucose, UA NEGATIVE NEGATIVE   Bilirubin Urine NEGATIVE NEGATIVE   Ketones, ur NEGATIVE NEGATIVE   Hgb urine dipstick NEGATIVE NEGATIVE   Protein, ur NEGATIVE NEGATIVE   Nitrite NEGATIVE NEGATIVE   Leukocytes,Ua NEGATIVE NEGATIVE  Urine Microscopic     Status: None   Collection Time: 02/07/20 11:13 AM  Result Value Ref Range   WBC, UA NONE SEEN 0 - 5 /HPF   RBC / HPF NONE SEEN 0 - 2 /HPF   Squamous Epithelial / LPF NONE SEEN < OR = 5 /HPF   Bacteria, UA NONE SEEN NONE SEEN /HPF   Hyaline Cast NONE SEEN NONE SEEN /LPF  CBC with Differential/Platelet     Status: Abnormal   Collection Time: 02/07/20 11:13 AM   Result Value Ref Range   WBC 6.8 3.8 - 10.8 Thousand/uL   RBC 5.06 4.20 - 5.80 Million/uL   Hemoglobin 12.4 (L) 13.2 - 17.1 g/dL   HCT 41.7 38.5 - 50.0 %   MCV 82.4 80.0 - 100.0 fL   MCH 24.5 (L) 27.0 - 33.0 pg   MCHC 29.7 (L) 32.0 - 36.0 g/dL   RDW 16.0 (H) 11.0 - 15.0 %   Platelets 136 (L) 140 - 400 Thousand/uL   MPV  7.5 - 12.5 fL   Neutro Abs 4,257 1,500 - 7,800 cells/uL   Lymphs Abs 1,578 850 - 3,900 cells/uL   Absolute Monocytes 775 200 - 950 cells/uL   Eosinophils Absolute 163 15 - 500 cells/uL   Basophils Absolute 27 0 - 200 cells/uL   Neutrophils Relative % 62.6 %   Total Lymphocyte 23.2 %   Monocytes Relative 11.4 %   Eosinophils Relative 2.4 %   Basophils Relative 0.4 %       Patient Instructions  Raynaud Phenomenon  Raynaud phenomenon is a condition that affects the blood vessels (arteries) that carry blood to your fingers and toes. The arteries that supply blood to your ears, lips, nipples, or the tip of your nose might also be affected. Raynaud phenomenon causes the arteries to become narrow temporarily (spasm). As a result, the flow of blood to the affected areas is temporarily  decreased. This usually occurs in response to cold temperatures or stress. During an attack, the skin in the affected areas turns white, then blue, and finally red. You may also feel tingling or numbness in those areas. Attacks usually last for only a brief period, and then the blood flow to the area returns to normal. In most cases, Raynaud phenomenon does not cause serious health problems. What are the causes? In many cases, the cause of this condition is not known. The condition may occur on its own (primary Raynaud phenomenon) or may be associated with other diseases or factors (secondary Raynaud phenomenon). Possible causes may include:  Diseases or medical conditions that damage the arteries.  Injuries and repetitive actions that hurt the hands or feet.  Being exposed to certain  chemicals.  Taking medicines that narrow the arteries.  Other medical conditions, such as lupus, scleroderma, rheumatoid arthritis, thyroid problems, blood disorders, Sjogren syndrome, or atherosclerosis. What increases the risk? The following factors may make you more likely to develop this condition:  Being 56-58 years old.  Being male.  Having a family history of Raynaud phenomenon.  Living in a cold climate.  Smoking. What are the signs or symptoms? Symptoms of this condition usually occur when you are exposed to cold temperatures or when you have emotional stress. The symptoms may last for a few minutes or up to several hours. They usually affect your fingers but may also affect your toes, nipples, lips, ears, or the tip of your nose. Symptoms may include:  Changes in skin color. The skin in the affected areas will turn pale or white. The skin may then change from white to bluish to red as normal blood flow returns to the area.  Numbness, tingling, or pain in the affected areas. In severe cases, symptoms may include:  Skin sores.  Tissues decaying and dying (gangrene). How is this diagnosed? This condition may be diagnosed based on:  Your symptoms and medical history.  A physical exam. During the exam, you may be asked to put your hands in cold water to check for a reaction to cold temperature.  Tests, such as: ? Blood tests to check for other diseases or conditions. ? A test to check the movement of blood through your arteries and veins (vascular ultrasound). ? A test in which the skin at the base of your fingernail is examined under a microscope (nailfold capillaroscopy). How is this treated? Treatment for this condition often involves making lifestyle changes and taking steps to control your exposure to cold temperatures. For more severe cases, medicine (calcium channel blockers) may be used to improve blood flow. Surgery is sometimes done to block the nerves that  control the affected arteries, but this is rare. Follow these instructions at home: Avoiding cold temperatures Take these steps to avoid exposure to cold:  If possible, stay indoors during cold weather.  When you go outside during cold weather, dress in layers and wear mittens, a hat, a scarf, and warm footwear.  Wear mittens or gloves when handling ice or frozen food.  Use holders for glasses or cans containing cold drinks.  Let warm water run for a while before taking a shower or bath.  Warm up the car before driving in cold weather. Lifestyle   If possible, avoid stressful and emotional situations. Try to find ways to manage your stress, such as: ? Exercise. ? Yoga. ? Meditation. ? Biofeedback.  Do not use any products that contain nicotine or tobacco, such as  cigarettes and e-cigarettes. If you need help quitting, ask your health care provider.  Avoid secondhand smoke.  Limit your use of caffeine. ? Switch to decaffeinated coffee, tea, and soda. ? Avoid chocolate.  Avoid vibrating tools and machinery. General instructions  Protect your hands and feet from injuries, cuts, or bruises.  Avoid wearing tight rings or wristbands.  Wear loose fitting socks and comfortable, roomy shoes.  Take over-the-counter and prescription medicines only as told by your health care provider. Contact a health care provider if:  Your discomfort becomes worse despite lifestyle changes.  You develop sores on your fingers or toes that do not heal.  Your fingers or toes turn black.  You have breaks in the skin on your fingers or toes.  You have a fever.  You have pain or swelling in your joints.  You have a rash.  Your symptoms occur on only one side of your body. Summary  Raynaud phenomenon is a condition that affects the arteries that carry blood to your fingers, toes, ears, lips, nipples, or the tip of your nose.  In many cases, the cause of this condition is not  known.  Symptoms of this condition include changes in skin color, and numbness and tingling of the affected area.  Treatment for this condition includes lifestyle changes, reducing exposure to cold temperatures, and using medicines for severe cases of the condition.  Contact your health care provider if your condition worsens despite treatment. This information is not intended to replace advice given to you by your health care provider. Make sure you discuss any questions you have with your health care provider. Document Revised: 01/30/2017 Document Reviewed: 03/10/2016 Elsevier Patient Education  2020 [place] This Encounter  Procedures  . Urinalysis  . Microalbumin / creatinine urine ratio  . Urine Microscopic  . TSH  . CBC with Differential/Platelet  . COMPLETE METABOLIC PANEL WITH GFR  . Sedimentation rate  . High sensitivity CRP  . Ambulatory referral to ENT    No orders of the defined types were placed in this encounter.      Follow-up instructions: Return for [place], SEE [name] SOONER IF NEEDED. CALL/MESSAGE W/ QUESTIONS.                                         BP 118/65   Pulse 76   Temp 97.8 F (36.6 C)   Wt 191 lb 3.2 oz (86.7 kg)   SpO2 100%   BMI 28.24 kg/m   Current Meds  Medication Sig  . acetaminophen (TYLENOL) 650 MG CR tablet Take 1-2 tablets (650-1,300 mg total) by mouth every 8 (eight) hours as needed for pain.  Marland Kitchen allopurinol (ZYLOPRIM) 300 MG tablet Take 1 tablet (300 mg total) by mouth daily.  Marland Kitchen apixaban (ELIQUIS) 2.5 MG TABS tablet Take 1 tablet (2.5 mg total) by mouth 2 (two) times daily.  Marland Kitchen atorvastatin (LIPITOR) 20 MG tablet Take 1 tablet (20 mg total) by mouth daily.  . clopidogrel (PLAVIX) 75 MG tablet Take 1 tablet (75 mg total) by mouth daily.  Marland Kitchen escitalopram (LEXAPRO) 5 MG/5ML solution TAKE 20 MLS (20 MG TOTAL) BY MOUTH DAILY.  . fluticasone  (FLONASE) 50 MCG/ACT nasal spray One spray in each nostril twice a day, use left hand for right nostril, and right hand for left nostril.  . irbesartan (AVAPRO)  75 MG tablet Take 1 tablet (75 mg total) by mouth daily.  Marland Kitchen linagliptin (TRADJENTA) 5 MG TABS tablet Take 1 tablet (5 mg total) by mouth daily.  . methocarbamol (ROBAXIN) 500 MG tablet Take 1 tablet (500 mg total) by mouth 2 (two) times daily as needed for muscle spasms.  . metoprolol succinate (TOPROL-XL) 50 MG 24 hr tablet Take 1 tablet (50 mg total) by mouth daily.  . nitroGLYCERIN (NITROSTAT) 0.6 MG SL tablet Place 0.6 mg under the tongue every 5 (five) minutes as needed for chest pain.  Marland Kitchen oxybutynin (DITROPAN-XL) 10 MG 24 hr tablet Take 1 tablet (10 mg total) by mouth at bedtime.  . traMADol (ULTRAM) 50 MG tablet Take 1 tablet (50 mg total) by mouth every 8 (eight) hours as needed for severe pain.    Results for orders placed or performed in visit on 02/07/20 (from the past 72 hour(s))  Urinalysis     Status: None   Collection Time: 02/07/20 11:13 AM  Result Value Ref Range   Color, Urine YELLOW YELLOW   APPearance CLEAR CLEAR   Specific Gravity, Urine 1.018 1.001 - 1.03   pH < OR = 5.0 5.0 - 8.0   Glucose, UA NEGATIVE NEGATIVE   Bilirubin Urine NEGATIVE NEGATIVE   Ketones, ur NEGATIVE NEGATIVE   Hgb urine dipstick NEGATIVE NEGATIVE   Protein, ur NEGATIVE NEGATIVE   Nitrite NEGATIVE NEGATIVE   Leukocytes,Ua NEGATIVE NEGATIVE  Urine Microscopic     Status: None   Collection Time: 02/07/20 11:13 AM  Result Value Ref Range   WBC, UA NONE SEEN 0 - 5 /HPF   RBC / HPF NONE SEEN 0 - 2 /HPF   Squamous Epithelial / LPF NONE SEEN < OR = 5 /HPF   Bacteria, UA NONE SEEN NONE SEEN /HPF   Hyaline Cast NONE SEEN NONE SEEN /LPF  CBC with Differential/Platelet     Status: Abnormal   Collection Time: 02/07/20 11:13 AM  Result Value Ref Range   WBC 6.8 3.8 - 10.8 Thousand/uL   RBC 5.06 4.20 - 5.80 Million/uL   Hemoglobin 12.4 (L) 13.2  - 17.1 g/dL   HCT 89.3 81.0 - 17.5 %   MCV 82.4 80.0 - 100.0 fL   MCH 24.5 (L) 27.0 - 33.0 pg   MCHC 29.7 (L) 32.0 - 36.0 g/dL   RDW 10.2 (H) 58.5 - 27.7 %   Platelets 136 (L) 140 - 400 Thousand/uL   MPV  7.5 - 12.5 fL    Comment: Due to platelet or RBC variability in size or shape the result cannot be reported accurately.    Neutro Abs 4,257 1,500 - 7,800 cells/uL   Lymphs Abs 1,578 850 - 3,900 cells/uL   Absolute Monocytes 775 200 - 950 cells/uL   Eosinophils Absolute 163 15 - 500 cells/uL   Basophils Absolute 27 0 - 200 cells/uL   Neutrophils Relative % 62.6 %   Total Lymphocyte 23.2 %   Monocytes Relative 11.4 %   Eosinophils Relative 2.4 %   Basophils Relative 0.4 %    No results found.     All questions at time of visit were answered - patient instructed to contact office with any additional concerns or updates.  ER/RTC precautions were reviewed with the patient as applicable.   Please note: voice recognition software was used to produce this document, and typos may escape review. Please contact Dr. Lyn Hollingshead for any needed clarifications.

## 2020-02-07 NOTE — Patient Instructions (Signed)
Raynaud Phenomenon ° °Raynaud phenomenon is a condition that affects the blood vessels (arteries) that carry blood to your fingers and toes. The arteries that supply blood to your ears, lips, nipples, or the tip of your nose might also be affected. Raynaud phenomenon causes the arteries to become narrow temporarily (spasm). As a result, the flow of blood to the affected areas is temporarily decreased. This usually occurs in response to cold temperatures or stress. During an attack, the skin in the affected areas turns white, then blue, and finally red. You may also feel tingling or numbness in those areas. °Attacks usually last for only a brief period, and then the blood flow to the area returns to normal. In most cases, Raynaud phenomenon does not cause serious health problems. °What are the causes? °In many cases, the cause of this condition is not known. The condition may occur on its own (primary Raynaud phenomenon) or may be associated with other diseases or factors (secondary Raynaud phenomenon). °Possible causes may include: °· Diseases or medical conditions that damage the arteries. °· Injuries and repetitive actions that hurt the hands or feet. °· Being exposed to certain chemicals. °· Taking medicines that narrow the arteries. °· Other medical conditions, such as lupus, scleroderma, rheumatoid arthritis, thyroid problems, blood disorders, Sjogren syndrome, or atherosclerosis. °What increases the risk? °The following factors may make you more likely to develop this condition: °· Being 20-40 years old. °· Being male. °· Having a family history of Raynaud phenomenon. °· Living in a cold climate. °· Smoking. °What are the signs or symptoms? °Symptoms of this condition usually occur when you are exposed to cold temperatures or when you have emotional stress. The symptoms may last for a few minutes or up to several hours. They usually affect your fingers but may also affect your toes, nipples, lips, ears, or  the tip of your nose. Symptoms may include: °· Changes in skin color. The skin in the affected areas will turn pale or white. The skin may then change from white to bluish to red as normal blood flow returns to the area. °· Numbness, tingling, or pain in the affected areas. °In severe cases, symptoms may include: °· Skin sores. °· Tissues decaying and dying (gangrene). °How is this diagnosed? °This condition may be diagnosed based on: °· Your symptoms and medical history. °· A physical exam. During the exam, you may be asked to put your hands in cold water to check for a reaction to cold temperature. °· Tests, such as: °? Blood tests to check for other diseases or conditions. °? A test to check the movement of blood through your arteries and veins (vascular ultrasound). °? A test in which the skin at the base of your fingernail is examined under a microscope (nailfold capillaroscopy). °How is this treated? °Treatment for this condition often involves making lifestyle changes and taking steps to control your exposure to cold temperatures. For more severe cases, medicine (calcium channel blockers) may be used to improve blood flow. Surgery is sometimes done to block the nerves that control the affected arteries, but this is rare. °Follow these instructions at home: °Avoiding cold temperatures °Take these steps to avoid exposure to cold: °· If possible, stay indoors during cold weather. °· When you go outside during cold weather, dress in layers and wear mittens, a hat, a scarf, and warm footwear. °· Wear mittens or gloves when handling ice or frozen food. °· Use holders for glasses or cans containing cold drinks. °·   Let warm water run for a while before taking a shower or bath. °· Warm up the car before driving in cold weather. °Lifestyle ° °· If possible, avoid stressful and emotional situations. Try to find ways to manage your stress, such as: °? Exercise. °? Yoga. °? Meditation. °? Biofeedback. °· Do not use any  products that contain nicotine or tobacco, such as cigarettes and e-cigarettes. If you need help quitting, ask your health care provider. °· Avoid secondhand smoke. °· Limit your use of caffeine. °? Switch to decaffeinated coffee, tea, and soda. °? Avoid chocolate. °· Avoid vibrating tools and machinery. °General instructions °· Protect your hands and feet from injuries, cuts, or bruises. °· Avoid wearing tight rings or wristbands. °· Wear loose fitting socks and comfortable, roomy shoes. °· Take over-the-counter and prescription medicines only as told by your health care provider. °Contact a health care provider if: °· Your discomfort becomes worse despite lifestyle changes. °· You develop sores on your fingers or toes that do not heal. °· Your fingers or toes turn black. °· You have breaks in the skin on your fingers or toes. °· You have a fever. °· You have pain or swelling in your joints. °· You have a rash. °· Your symptoms occur on only one side of your body. °Summary °· Raynaud phenomenon is a condition that affects the arteries that carry blood to your fingers, toes, ears, lips, nipples, or the tip of your nose. °· In many cases, the cause of this condition is not known. °· Symptoms of this condition include changes in skin color, and numbness and tingling of the affected area. °· Treatment for this condition includes lifestyle changes, reducing exposure to cold temperatures, and using medicines for severe cases of the condition. °· Contact your health care provider if your condition worsens despite treatment. °This information is not intended to replace advice given to you by your health care provider. Make sure you discuss any questions you have with your health care provider. °Document Revised: 01/30/2017 Document Reviewed: 03/10/2016 °Elsevier Patient Education © 2020 [employer] Inc[employer] ° °

## 2020-02-08 LAB — COMPLETE METABOLIC PANEL WITH GFR
AG Ratio: 1.6 (calc) (ref 1.0–2.5)
ALT: 23 U/L (ref 9–46)
AST: 26 U/L (ref 10–35)
Albumin: 4.1 g/dL (ref 3.6–5.1)
Alkaline phosphatase (APISO): 127 U/L (ref 35–144)
BUN/Creatinine Ratio: 19 (calc) (ref 6–22)
BUN: 36 mg/dL — ABNORMAL HIGH (ref 7–25)
CO2: 22 mmol/L (ref 20–32)
Calcium: 9.5 mg/dL (ref 8.6–10.3)
Chloride: 113 mmol/L — ABNORMAL HIGH (ref 98–110)
Creat: 1.88 mg/dL — ABNORMAL HIGH (ref 0.70–1.11)
GFR, Est African American: 38 mL/min/{1.73_m2} — ABNORMAL LOW (ref >=60)
GFR, Est Non African American: 33 mL/min/{1.73_m2} — ABNORMAL LOW (ref >=60)
Globulin: 2.5 g/dL (calc) (ref 1.9–3.7)
Glucose, Bld: 85 mg/dL (ref 65–99)
Potassium: 5.9 mmol/L — ABNORMAL HIGH (ref 3.5–5.3)
Sodium: 138 mmol/L (ref 135–146)
Total Bilirubin: 0.3 mg/dL (ref 0.2–1.2)
Total Protein: 6.6 g/dL (ref 6.1–8.1)

## 2020-02-08 LAB — CBC WITH DIFFERENTIAL/PLATELET
Absolute Monocytes: 775 cells/uL (ref 200–950)
Basophils Absolute: 27 cells/uL (ref 0–200)
Basophils Relative: 0.4 %
Eosinophils Absolute: 163 cells/uL (ref 15–500)
Eosinophils Relative: 2.4 %
HCT: 41.7 % (ref 38.5–50.0)
Hemoglobin: 12.4 g/dL — ABNORMAL LOW (ref 13.2–17.1)
Lymphs Abs: 1578 cells/uL (ref 850–3900)
MCH: 24.5 pg — ABNORMAL LOW (ref 27.0–33.0)
MCHC: 29.7 g/dL — ABNORMAL LOW (ref 32.0–36.0)
MCV: 82.4 fL (ref 80.0–100.0)
Monocytes Relative: 11.4 %
Neutro Abs: 4257 cells/uL (ref 1500–7800)
Neutrophils Relative %: 62.6 %
Platelets: 136 10*3/uL — ABNORMAL LOW (ref 140–400)
RBC: 5.06 10*6/uL (ref 4.20–5.80)
RDW: 16 % — ABNORMAL HIGH (ref 11.0–15.0)
Total Lymphocyte: 23.2 %
WBC: 6.8 10*3/uL (ref 3.8–10.8)

## 2020-02-08 LAB — URINALYSIS
Bilirubin Urine: NEGATIVE
Glucose, UA: NEGATIVE
Hgb urine dipstick: NEGATIVE
Ketones, ur: NEGATIVE
Leukocytes,Ua: NEGATIVE
Nitrite: NEGATIVE
Protein, ur: NEGATIVE
Specific Gravity, Urine: 1.018 (ref 1.001–1.03)
pH: <=5 (ref 5.0–8.0)

## 2020-02-08 LAB — SEDIMENTATION RATE: Sed Rate: 2 mm/h (ref 0–20)

## 2020-02-08 LAB — URINALYSIS, MICROSCOPIC ONLY
Bacteria, UA: NONE SEEN /HPF
Hyaline Cast: NONE SEEN /LPF
RBC / HPF: NONE SEEN /HPF (ref 0–2)
Squamous Epithelial / HPF: NONE SEEN /HPF (ref <=5)
WBC, UA: NONE SEEN /HPF (ref 0–5)

## 2020-02-08 LAB — MICROALBUMIN / CREATININE URINE RATIO
Creatinine, Urine: 173 mg/dL (ref 20–320)
Microalb Creat Ratio: 3 mcg/mg creat (ref <30)
Microalb, Ur: 0.5 mg/dL

## 2020-02-15 DIAGNOSIS — J300 Vasomotor rhinitis: Secondary | ICD-10-CM | POA: Diagnosis not present

## 2020-02-15 DIAGNOSIS — J387 Other diseases of larynx: Secondary | ICD-10-CM | POA: Diagnosis not present

## 2020-03-09 ENCOUNTER — Other Ambulatory Visit: Payer: Self-pay | Admitting: Osteopathic Medicine

## 2020-03-09 DIAGNOSIS — I152 Hypertension secondary to endocrine disorders: Secondary | ICD-10-CM

## 2020-03-09 DIAGNOSIS — E1159 Type 2 diabetes mellitus with other circulatory complications: Secondary | ICD-10-CM

## 2020-03-26 ENCOUNTER — Other Ambulatory Visit: Payer: Self-pay | Admitting: Osteopathic Medicine

## 2020-03-26 DIAGNOSIS — I152 Hypertension secondary to endocrine disorders: Secondary | ICD-10-CM

## 2020-03-26 DIAGNOSIS — E1159 Type 2 diabetes mellitus with other circulatory complications: Secondary | ICD-10-CM

## 2020-03-26 NOTE — Telephone Encounter (Signed)
[facility] requesting med refills for metoprolol and amlodipine (not on active med list). Pls advise, thanks.   Patient called stating he has been 1 wk without bp rx. Per provider's note, changes were made to patient's medications. Pls advise, thanks.

## 2020-03-28 MED ORDER — METOPROLOL SUCCINATE ER 50 MG PO TB24: 50.0000 mg | ORAL_TABLET | Freq: Every day | ORAL | 1 refills | Status: DC

## 2020-03-28 NOTE — Telephone Encounter (Signed)
Noted  

## 2020-03-28 NOTE — Telephone Encounter (Signed)
There have definitely been some issues in the past with patient not taking medications appropriately, at 1 point we had a visit devoted just to reconciling all of his medicines, his list as it is right now is accurate, he should be taking metoprolol 50 mg once daily, he should not be taking the amlodipine.  Metoprolol was refilled at appropriate dose   Meds ordered this encounter  Medications  . metoprolol succinate (TOPROL-XL) 50 MG 24 hr tablet    Sig: Take 1 tablet (50 mg total) by mouth daily.    Dispense:  90 tablet    Refill:  1

## 2020-04-03 ENCOUNTER — Encounter: Payer: Self-pay | Admitting: Osteopathic Medicine

## 2020-04-03 ENCOUNTER — Ambulatory Visit (INDEPENDENT_AMBULATORY_CARE_PROVIDER_SITE_OTHER): Payer: Medicare Other | Admitting: Osteopathic Medicine

## 2020-04-03 ENCOUNTER — Other Ambulatory Visit: Payer: Self-pay

## 2020-04-03 VITALS — BP 104/64 | HR 81 | Temp 98.5°F | Wt 191.0 lb

## 2020-04-03 DIAGNOSIS — I7300 Raynaud's syndrome without gangrene: Secondary | ICD-10-CM | POA: Diagnosis not present

## 2020-04-03 DIAGNOSIS — Z932 Ileostomy status: Secondary | ICD-10-CM

## 2020-04-03 MED ORDER — NYSTATIN POWD: 11 refills | Status: DC

## 2020-04-03 NOTE — Progress Notes (Signed)
Damon Acosta is a 80 y.o. male who presents to  [facility]  today, 04/03/20, seeking care for the following:  . Ostomy problem: past several months feels like fecal material is too liquid, lots of leaking, skin irritating, and like the ostomy is prolapsing slightly. Taking fiber, not sure which brand. Not taking imdoium. No pain. He has been using waterproof tape around the ostomy.  . Still concerned about Raynaud's we talked about the pathophysiology of this     [place] with other pertinent findings:  The primary encounter diagnosis was Ileostomy status ([place]). A diagnosis of Raynaud's disease without gangrene was also pertinent to this visit.    Patient Instructions  Soluble fiber supplements can bulk up digestive material and make this less runny, avoid dehydration: Start FiberCon (calcium polycarbophil) -OR- Benefiber (wheat dextrin) at low dose and increase to full dose w/ all meals. Can also start Imodium (loperamide) one tablet two to three times a day.   Ostomy care: avoid the plastic tape if possible. I've given you some fabric medical tape - can get more of this from medical supply. Can use the Curad Xeroform (sticky yellow) antibiotic dressing around the opening of the ostomy to keep that area clean/sealed. I sent Nystatin powder to help the skin. Apply this to skin few times per day if you can sit and leave it air out for a bit.   I've asked home health nursing to come out and help with this / offer other suggestions or supplies.   I've sent referral to surgeon to talk about if the ostomy needs to be surgically corrected in any way.                 Orders Placed This Encounter  Procedures  . Ambulatory referral to Home Health  . Ambulatory referral to General Surgery    Meds ordered this encounter  Medications  . Nystatin POWD    Sig: Apply liberally to affected area 2 times per day     Dispense:  3 each    Refill:  11     See below for relevant physical exam findings  See below for recent lab and imaging results reviewed  Medications, allergies, PMH, PSH, SocH, [place] reviewed below    Follow-up instructions: Return if symptoms worsen or fail to improve.                                        Exam:  BP 104/64 (BP Location: Left Arm, Patient Position: Sitting, Cuff Size: Normal)   Pulse 81   Temp 98.5 F (36.9 C) (Oral)   Wt 191 lb 0.6 oz (86.7 kg)   BMI 28.21 kg/m   Constitutional: VS see above. General Appearance: alert, well-developed, well-nourished, NAD  Neck: No masses, trachea midline.   Respiratory: Normal respiratory effort. no wheeze, no rhonchi, no rales  Cardiovascular: S1/S2 normal, no murmur, no rub/gallop auscultated. RRR.   Musculoskeletal: Gait normal. Symmetric and independent movement of all extremities  Abdominal: non-tender, non-distended, no appreciable organomegaly, neg Murphy's, BS WNLx4. Ostomy sire unable to be examined completely  -pt reluctant to remove the tape. Visible skin appears normal close to ostomy, in folds of abdominal skin there is some mild ulceration.   Neurological: Normal balance/coordination. No tremor.  Skin: warm, dry, intact.   Psychiatric: Normal judgment/insight. Normal  mood and affect. Oriented x3.   Current Meds  Medication Sig  . acetaminophen (TYLENOL) 650 MG CR tablet Take 1-2 tablets (650-1,300 mg total) by mouth every 8 (eight) hours as needed for pain.  Marland Kitchen allopurinol (ZYLOPRIM) 300 MG tablet Take 1 tablet (300 mg total) by mouth daily.  Marland Kitchen apixaban (ELIQUIS) 2.5 MG TABS tablet Take 1 tablet (2.5 mg total) by mouth 2 (two) times daily.  Marland Kitchen atorvastatin (LIPITOR) 20 MG tablet Take 1 tablet (20 mg total) by mouth daily.  . clopidogrel (PLAVIX) 75 MG tablet Take 1 tablet (75 mg total) by mouth daily.  Marland Kitchen escitalopram (LEXAPRO) 5 MG/5ML solution TAKE 20 MLS (20 MG  TOTAL) BY MOUTH DAILY.  . fluticasone (FLONASE) 50 MCG/ACT nasal spray One spray in each nostril twice a day, use left hand for right nostril, and right hand for left nostril.  . irbesartan (AVAPRO) 75 MG tablet Take 1 tablet (75 mg total) by mouth daily.  Marland Kitchen linagliptin (TRADJENTA) 5 MG TABS tablet Take 1 tablet (5 mg total) by mouth daily.  . methocarbamol (ROBAXIN) 500 MG tablet Take 1 tablet (500 mg total) by mouth 2 (two) times daily as needed for muscle spasms.  . metoprolol succinate (TOPROL-XL) 50 MG 24 hr tablet Take 1 tablet (50 mg total) by mouth daily.  . nitroGLYCERIN (NITROSTAT) 0.6 MG SL tablet Place 0.6 mg under the tongue every 5 (five) minutes as needed for chest pain.  Marland Kitchen Nystatin POWD Apply liberally to affected area 2 times per day  . oxybutynin (DITROPAN-XL) 10 MG 24 hr tablet Take 1 tablet (10 mg total) by mouth at bedtime.  . traMADol (ULTRAM) 50 MG tablet Take 1 tablet (50 mg total) by mouth every 8 (eight) hours as needed for severe pain.    Allergies  Allergen Reactions  . Aspirin Swelling    Other reaction(s): Unknown  . Doxepin Other (See Comments)    Dry mouth Other reaction(s): Other, Other (See Comments)   . Shellfish Allergy   . Sertraline Diarrhea    Patient Active Problem List   Diagnosis Date Noted  . History of MI (myocardial infarction) 10/21/2019  . Asymptomatic PVCs 07/27/2018  . Melena 04/27/2018  . CKD (chronic kidney disease) stage 3, GFR 30-59 ml/min ([name]) 04/27/2018  . Mild cognitive impairment 04/20/2018  . Microcytic anemia 04/20/2018  . Primary osteoarthritis of both knees 02/17/2018  . Insomnia due to anxiety and fear 01/26/2018  . Corn of left foot, plantar fifth MTP 01/26/2018  . 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 Type 2 diabetes mellitus with chronic kidney disease, without long-term current use of insulin ([place]) 01/06/2018  . Hyperkalemia 01/06/2018  . Elevated serum creatinine  01/06/2018  . History of gout 01/06/2018  . Dyslipidemia associated with type 2 diabetes mellitus ([place]) 01/06/2018  . Dyspepsia 01/06/2018  . OAB (overactive bladder) 01/06/2018  . History of prostate cancer 01/06/2018  . Current moderate episode of major depressive disorder without prior episode ([place]) 01/06/2018  . Anxiety about health 01/06/2018  . Lumbar spinal stenosis 12/15/2017  . Colostomy status ([place]) 12/15/2017  . Hyperlipidemia 12/15/2017  . Hypertension associated with diabetes ([place]) 12/15/2017  . Gout 12/15/2017  . History of CVA (cerebrovascular accident) 12/15/2017    Family History  Family history unknown: Yes    Social History   Tobacco Use  Smoking Status Former Smoker  Smokeless Tobacco Never Used    Past Surgical History:  Procedure Laterality Date  . CHOLECYSTECTOMY    .  COLOSTOMY    . PROSTATE SURGERY      Immunization History  Administered Date(s) Administered  . Fluad Quad(high Dose 65+) 11/17/2019  . Influenza,inj,Quad PF,6+ Mos 10/25/2018  . Moderna Sars-Covid-2 Vaccination 03/25/2019, 04/23/2019  . Pneumococcal Polysaccharide-23 01/26/2018    Recent Results (from the past 2160 hour(s))  Urinalysis     Status: None   Collection Time: 02/07/20 11:13 AM  Result Value Ref Range   Color, Urine YELLOW YELLOW   APPearance CLEAR CLEAR   Specific Gravity, Urine 1.018 1.001 - 1.03   pH < OR = 5.0 5.0 - 8.0   Glucose, UA NEGATIVE NEGATIVE   Bilirubin Urine NEGATIVE NEGATIVE   Ketones, ur NEGATIVE NEGATIVE   Hgb urine dipstick NEGATIVE NEGATIVE   Protein, ur NEGATIVE NEGATIVE   Nitrite NEGATIVE NEGATIVE   Leukocytes,Ua NEGATIVE NEGATIVE  Microalbumin / creatinine urine ratio     Status: None   Collection Time: 02/07/20 11:13 AM  Result Value Ref Range   Creatinine, Urine 173 20 - 320 mg/dL   Microalb, Ur 0.5 mg/dL    Comment: Reference Range Not established    Microalb Creat Ratio 3 <30 mcg/mg creat    Comment: . The ADA defines  abnormalities in albumin excretion as follows: Marland Kitchen Albuminuria Category        Result (mcg/mg creatinine) . Normal to Mildly increased   <30 Moderately increased         30-299  Severely increased           > OR = 300 . The ADA recommends that at least two of three specimens collected within a 3-6 month period be abnormal before considering a patient to be within a diagnostic category.   Urine Microscopic     Status: None   Collection Time: 02/07/20 11:13 AM  Result Value Ref Range   WBC, UA NONE SEEN 0 - 5 /HPF   RBC / HPF NONE SEEN 0 - 2 /HPF   Squamous Epithelial / LPF NONE SEEN < OR = 5 /HPF   Bacteria, UA NONE SEEN NONE SEEN /HPF   Hyaline Cast NONE SEEN NONE SEEN /LPF  CBC with Differential/Platelet     Status: Abnormal   Collection Time: 02/07/20 11:13 AM  Result Value Ref Range   WBC 6.8 3.8 - 10.8 Thousand/uL   RBC 5.06 4.20 - 5.80 Million/uL   Hemoglobin 12.4 (L) 13.2 - 17.1 g/dL   HCT 41.7 38.5 - 50.0 %   MCV 82.4 80.0 - 100.0 fL   MCH 24.5 (L) 27.0 - 33.0 pg   MCHC 29.7 (L) 32.0 - 36.0 g/dL   RDW 16.0 (H) 11.0 - 15.0 %   Platelets 136 (L) 140 - 400 Thousand/uL   MPV  7.5 - 12.5 fL    Comment: Due to platelet or RBC variability in size or shape the result cannot be reported accurately.    Neutro Abs 4,257 1,500 - 7,800 cells/uL   Lymphs Abs 1,578 850 - 3,900 cells/uL   Absolute Monocytes 775 200 - 950 cells/uL   Eosinophils Absolute 163 15 - 500 cells/uL   Basophils Absolute 27 0 - 200 cells/uL   Neutrophils Relative % 62.6 %   Total Lymphocyte 23.2 %   Monocytes Relative 11.4 %   Eosinophils Relative 2.4 %   Basophils Relative 0.4 %  COMPLETE METABOLIC PANEL WITH GFR     Status: Abnormal   Collection Time: 02/07/20 11:13 AM  Result Value Ref Range   Glucose, Bld  85 65 - 99 mg/dL    Comment: .            Fasting reference interval .    BUN 36 (H) 7 - 25 mg/dL   Creat 1.88 (H) 0.70 - 1.11 mg/dL    Comment: For patients >2 years of age, the reference  limit for Creatinine is approximately 13% higher for people identified as African-American. .    GFR, Est Non African American 33 (L) > OR = 60 mL/min/1.32m2   GFR, Est African American 38 (L) > OR = 60 mL/min/1.20m2   BUN/Creatinine Ratio 19 6 - 22 (calc)   Sodium 138 135 - 146 mmol/L   Potassium 5.9 (H) 3.5 - 5.3 mmol/L   Chloride 113 (H) 98 - 110 mmol/L    Comment: Verified by repeat analysis. .    CO2 22 20 - 32 mmol/L   Calcium 9.5 8.6 - 10.3 mg/dL   Total Protein 6.6 6.1 - 8.1 g/dL   Albumin 4.1 3.6 - 5.1 g/dL   Globulin 2.5 1.9 - 3.7 g/dL (calc)   AG Ratio 1.6 1.0 - 2.5 (calc)   Total Bilirubin 0.3 0.2 - 1.2 mg/dL   Alkaline phosphatase (APISO) 127 35 - 144 U/L   AST 26 10 - 35 U/L   ALT 23 9 - 46 U/L  Sedimentation rate     Status: None   Collection Time: 02/07/20 11:13 AM  Result Value Ref Range   Sed Rate 2 0 - 20 mm/h    No results found.     All questions at time of visit were answered - patient instructed to contact office with any additional concerns or updates. ER/RTC precautions were reviewed with the patient as applicable.   Please note: manual typing as well as voice recognition software may have been used to produce this document - typos may escape review. Please contact Dr. Sheppard Coil for any needed clarifications.

## 2020-04-03 NOTE — Patient Instructions (Addendum)
Soluble fiber supplements can bulk up digestive material and make this less runny, avoid dehydration: Start FiberCon (calcium polycarbophil) -OR- Benefiber (wheat dextrin) at low dose and increase to full dose w/ all meals. Can also start Imodium (loperamide) one tablet two to three times a day.   Ostomy care: avoid the plastic tape if possible. I've given you some fabric medical tape - can get more of this from medical supply. Can use the Curad Xeroform (sticky yellow) antibiotic dressing around the opening of the ostomy to keep that area clean/sealed. I sent Nystatin powder to help the skin. Apply this to skin few times per day if you can sit and leave it air out for a bit.   I've asked home health nursing to come out and help with this / offer other suggestions or supplies.   I've sent referral to surgeon to talk about if the ostomy needs to be surgically corrected in any way.

## 2020-04-08 DIAGNOSIS — K9409 Other complications of colostomy: Secondary | ICD-10-CM | POA: Diagnosis not present

## 2020-04-08 DIAGNOSIS — E1169 Type 2 diabetes mellitus with other specified complication: Secondary | ICD-10-CM | POA: Diagnosis not present

## 2020-04-08 DIAGNOSIS — G3184 Mild cognitive impairment, so stated: Secondary | ICD-10-CM | POA: Diagnosis not present

## 2020-04-08 DIAGNOSIS — L24B1 Irritant contact dermatitis related to digestive stoma or fistula: Secondary | ICD-10-CM | POA: Diagnosis not present

## 2020-04-08 DIAGNOSIS — M170 Bilateral primary osteoarthritis of knee: Secondary | ICD-10-CM | POA: Diagnosis not present

## 2020-04-08 DIAGNOSIS — N3281 Overactive bladder: Secondary | ICD-10-CM | POA: Diagnosis not present

## 2020-04-08 DIAGNOSIS — I7300 Raynaud's syndrome without gangrene: Secondary | ICD-10-CM | POA: Diagnosis not present

## 2020-04-08 DIAGNOSIS — I493 Ventricular premature depolarization: Secondary | ICD-10-CM | POA: Diagnosis not present

## 2020-04-08 DIAGNOSIS — E785 Hyperlipidemia, unspecified: Secondary | ICD-10-CM | POA: Diagnosis not present

## 2020-04-08 DIAGNOSIS — I129 Hypertensive chronic kidney disease with stage 1 through stage 4 chronic kidney disease, or unspecified chronic kidney disease: Secondary | ICD-10-CM | POA: Diagnosis not present

## 2020-04-08 DIAGNOSIS — F419 Anxiety disorder, unspecified: Secondary | ICD-10-CM | POA: Diagnosis not present

## 2020-04-08 DIAGNOSIS — Z8673 Personal history of transient ischemic attack (TIA), and cerebral infarction without residual deficits: Secondary | ICD-10-CM | POA: Diagnosis not present

## 2020-04-08 DIAGNOSIS — D649 Anemia, unspecified: Secondary | ICD-10-CM | POA: Diagnosis not present

## 2020-04-08 DIAGNOSIS — Z8546 Personal history of malignant neoplasm of prostate: Secondary | ICD-10-CM | POA: Diagnosis not present

## 2020-04-08 DIAGNOSIS — N183 Chronic kidney disease, stage 3 unspecified: Secondary | ICD-10-CM | POA: Diagnosis not present

## 2020-04-08 DIAGNOSIS — M48061 Spinal stenosis, lumbar region without neurogenic claudication: Secondary | ICD-10-CM | POA: Diagnosis not present

## 2020-04-08 DIAGNOSIS — F5105 Insomnia due to other mental disorder: Secondary | ICD-10-CM | POA: Diagnosis not present

## 2020-04-08 DIAGNOSIS — E1122 Type 2 diabetes mellitus with diabetic chronic kidney disease: Secondary | ICD-10-CM | POA: Diagnosis not present

## 2020-04-08 DIAGNOSIS — K5981 Ogilvie syndrome: Secondary | ICD-10-CM | POA: Diagnosis not present

## 2020-04-09 ENCOUNTER — Other Ambulatory Visit: Payer: Self-pay | Admitting: Osteopathic Medicine

## 2020-04-09 ENCOUNTER — Other Ambulatory Visit: Payer: Self-pay

## 2020-04-09 DIAGNOSIS — M48062 Spinal stenosis, lumbar region with neurogenic claudication: Secondary | ICD-10-CM

## 2020-04-09 MED ORDER — METHOCARBAMOL 500 MG PO TABS: 500.0000 mg | ORAL_TABLET | Freq: Two times a day (BID) | ORAL | 2 refills | Status: DC | PRN

## 2020-04-09 MED ORDER — ESCITALOPRAM OXALATE 5 MG/5ML PO SOLN: 20.0000 mg | Freq: Every day | ORAL | 3 refills | Status: DC

## 2020-04-09 MED ORDER — TRAMADOL HCL 50 MG PO TABS: 50.0000 mg | ORAL_TABLET | Freq: Three times a day (TID) | ORAL | 0 refills | Status: DC | PRN

## 2020-04-09 NOTE — Telephone Encounter (Signed)
Tramadol rx pended for provider.

## 2020-04-09 NOTE — Telephone Encounter (Signed)
The home nurse was out seeing patient yesterday and told patient that he needed these medications today and he has checked [facility] and they are not there.  Escitalopram (Lexapro), Methocarbomol (Robaxin), and Tramadol   [id number] - [place], [place] - [address] Phone:  [phone]  Fax:  [phone]

## 2020-04-10 ENCOUNTER — Telehealth: Payer: Self-pay

## 2020-04-10 DIAGNOSIS — I7300 Raynaud's syndrome without gangrene: Secondary | ICD-10-CM

## 2020-04-10 NOTE — Telephone Encounter (Signed)
Done and done

## 2020-04-10 NOTE — Telephone Encounter (Signed)
RN Claiborne Billings from [facility] called requesting a verbal order for home health nurse twice a week for 4 weeks. She was also requesting a referral for Rheumatologist for treatment of Raynaud's Disease. Referral pended. Attempted to contact Claiborne Billings - no answer. Left a detailed vm msg to proceed with home health nurse.

## 2020-04-10 NOTE — Telephone Encounter (Signed)
Notification through covermymeds that a PA was needed for patient's Methocarbamol 500mg .  PA submitted; Awaiting response.

## 2020-04-12 ENCOUNTER — Other Ambulatory Visit: Payer: Self-pay | Admitting: Osteopathic Medicine

## 2020-04-12 DIAGNOSIS — Z8673 Personal history of transient ischemic attack (TIA), and cerebral infarction without residual deficits: Secondary | ICD-10-CM

## 2020-04-14 ENCOUNTER — Other Ambulatory Visit: Payer: Self-pay | Admitting: Osteopathic Medicine

## 2020-04-14 DIAGNOSIS — I152 Hypertension secondary to endocrine disorders: Secondary | ICD-10-CM

## 2020-04-14 DIAGNOSIS — Z8739 Personal history of other diseases of the musculoskeletal system and connective tissue: Secondary | ICD-10-CM

## 2020-04-14 DIAGNOSIS — E1159 Type 2 diabetes mellitus with other circulatory complications: Secondary | ICD-10-CM

## 2020-04-17 DIAGNOSIS — E1122 Type 2 diabetes mellitus with diabetic chronic kidney disease: Secondary | ICD-10-CM | POA: Diagnosis not present

## 2020-04-17 DIAGNOSIS — I129 Hypertensive chronic kidney disease with stage 1 through stage 4 chronic kidney disease, or unspecified chronic kidney disease: Secondary | ICD-10-CM | POA: Diagnosis not present

## 2020-04-17 DIAGNOSIS — L24B1 Irritant contact dermatitis related to digestive stoma or fistula: Secondary | ICD-10-CM | POA: Diagnosis not present

## 2020-04-17 DIAGNOSIS — G3184 Mild cognitive impairment, so stated: Secondary | ICD-10-CM | POA: Diagnosis not present

## 2020-04-17 DIAGNOSIS — I7300 Raynaud's syndrome without gangrene: Secondary | ICD-10-CM | POA: Diagnosis not present

## 2020-04-17 DIAGNOSIS — K9409 Other complications of colostomy: Secondary | ICD-10-CM | POA: Diagnosis not present

## 2020-04-19 DIAGNOSIS — I7300 Raynaud's syndrome without gangrene: Secondary | ICD-10-CM | POA: Diagnosis not present

## 2020-04-19 DIAGNOSIS — L24B1 Irritant contact dermatitis related to digestive stoma or fistula: Secondary | ICD-10-CM | POA: Diagnosis not present

## 2020-04-19 DIAGNOSIS — G3184 Mild cognitive impairment, so stated: Secondary | ICD-10-CM | POA: Diagnosis not present

## 2020-04-19 DIAGNOSIS — K9409 Other complications of colostomy: Secondary | ICD-10-CM | POA: Diagnosis not present

## 2020-04-19 DIAGNOSIS — E1122 Type 2 diabetes mellitus with diabetic chronic kidney disease: Secondary | ICD-10-CM | POA: Diagnosis not present

## 2020-04-19 DIAGNOSIS — I129 Hypertensive chronic kidney disease with stage 1 through stage 4 chronic kidney disease, or unspecified chronic kidney disease: Secondary | ICD-10-CM | POA: Diagnosis not present

## 2020-04-24 DIAGNOSIS — K9409 Other complications of colostomy: Secondary | ICD-10-CM | POA: Diagnosis not present

## 2020-04-24 DIAGNOSIS — E1122 Type 2 diabetes mellitus with diabetic chronic kidney disease: Secondary | ICD-10-CM | POA: Diagnosis not present

## 2020-04-24 DIAGNOSIS — G3184 Mild cognitive impairment, so stated: Secondary | ICD-10-CM | POA: Diagnosis not present

## 2020-04-24 DIAGNOSIS — L24B1 Irritant contact dermatitis related to digestive stoma or fistula: Secondary | ICD-10-CM | POA: Diagnosis not present

## 2020-04-24 DIAGNOSIS — I129 Hypertensive chronic kidney disease with stage 1 through stage 4 chronic kidney disease, or unspecified chronic kidney disease: Secondary | ICD-10-CM | POA: Diagnosis not present

## 2020-04-24 DIAGNOSIS — I7300 Raynaud's syndrome without gangrene: Secondary | ICD-10-CM | POA: Diagnosis not present

## 2020-04-25 ENCOUNTER — Telehealth: Payer: Self-pay

## 2020-04-25 NOTE — Telephone Encounter (Signed)
Onalee Hua from [facility] called to inform provider of patient's low blood pressure. Per home nurse, patient was not drinking plenty of fluids. His blood pressure range was 90/50. RN states she will be checking on patient in the next couple of days to complete another bp check on him. She will contact the office as needed with any updates.

## 2020-05-02 DIAGNOSIS — G3184 Mild cognitive impairment, so stated: Secondary | ICD-10-CM | POA: Diagnosis not present

## 2020-05-02 DIAGNOSIS — I7300 Raynaud's syndrome without gangrene: Secondary | ICD-10-CM | POA: Diagnosis not present

## 2020-05-02 DIAGNOSIS — K9409 Other complications of colostomy: Secondary | ICD-10-CM | POA: Diagnosis not present

## 2020-05-02 DIAGNOSIS — L24B1 Irritant contact dermatitis related to digestive stoma or fistula: Secondary | ICD-10-CM | POA: Diagnosis not present

## 2020-05-02 DIAGNOSIS — E1122 Type 2 diabetes mellitus with diabetic chronic kidney disease: Secondary | ICD-10-CM | POA: Diagnosis not present

## 2020-05-02 DIAGNOSIS — I129 Hypertensive chronic kidney disease with stage 1 through stage 4 chronic kidney disease, or unspecified chronic kidney disease: Secondary | ICD-10-CM | POA: Diagnosis not present

## 2020-05-07 DIAGNOSIS — Z886 Allergy status to analgesic agent status: Secondary | ICD-10-CM | POA: Diagnosis not present

## 2020-05-07 DIAGNOSIS — Z8673 Personal history of transient ischemic attack (TIA), and cerebral infarction without residual deficits: Secondary | ICD-10-CM | POA: Diagnosis not present

## 2020-05-07 DIAGNOSIS — Z7901 Long term (current) use of anticoagulants: Secondary | ICD-10-CM | POA: Diagnosis not present

## 2020-05-07 DIAGNOSIS — F419 Anxiety disorder, unspecified: Secondary | ICD-10-CM | POA: Diagnosis not present

## 2020-05-07 DIAGNOSIS — Z87891 Personal history of nicotine dependence: Secondary | ICD-10-CM | POA: Diagnosis not present

## 2020-05-07 DIAGNOSIS — Z7902 Long term (current) use of antithrombotics/antiplatelets: Secondary | ICD-10-CM | POA: Diagnosis not present

## 2020-05-07 DIAGNOSIS — R000 Tachycardia, unspecified: Secondary | ICD-10-CM | POA: Diagnosis not present

## 2020-05-07 DIAGNOSIS — Z79899 Other long term (current) drug therapy: Secondary | ICD-10-CM | POA: Diagnosis not present

## 2020-05-07 DIAGNOSIS — Z91013 Allergy to seafood: Secondary | ICD-10-CM | POA: Diagnosis not present

## 2020-05-07 DIAGNOSIS — Z888 Allergy status to other drugs, medicaments and biological substances status: Secondary | ICD-10-CM | POA: Diagnosis not present

## 2020-05-07 DIAGNOSIS — K9409 Other complications of colostomy: Secondary | ICD-10-CM | POA: Diagnosis not present

## 2020-05-07 DIAGNOSIS — E785 Hyperlipidemia, unspecified: Secondary | ICD-10-CM | POA: Diagnosis not present

## 2020-05-07 DIAGNOSIS — N183 Chronic kidney disease, stage 3 unspecified: Secondary | ICD-10-CM | POA: Diagnosis not present

## 2020-05-07 DIAGNOSIS — I129 Hypertensive chronic kidney disease with stage 1 through stage 4 chronic kidney disease, or unspecified chronic kidney disease: Secondary | ICD-10-CM | POA: Diagnosis not present

## 2020-05-07 DIAGNOSIS — E1122 Type 2 diabetes mellitus with diabetic chronic kidney disease: Secondary | ICD-10-CM | POA: Diagnosis not present

## 2020-05-08 DIAGNOSIS — Z932 Ileostomy status: Secondary | ICD-10-CM | POA: Diagnosis not present

## 2020-05-14 DIAGNOSIS — Z432 Encounter for attention to ileostomy: Secondary | ICD-10-CM | POA: Diagnosis not present

## 2020-05-17 ENCOUNTER — Ambulatory Visit (INDEPENDENT_AMBULATORY_CARE_PROVIDER_SITE_OTHER): Payer: Medicare Other | Admitting: Osteopathic Medicine

## 2020-05-17 ENCOUNTER — Other Ambulatory Visit: Payer: Self-pay

## 2020-05-17 ENCOUNTER — Encounter: Payer: Self-pay | Admitting: Osteopathic Medicine

## 2020-05-17 VITALS — BP 107/70 | HR 116 | Temp 98.1°F | Wt 185.1 lb

## 2020-05-17 DIAGNOSIS — E1122 Type 2 diabetes mellitus with diabetic chronic kidney disease: Secondary | ICD-10-CM

## 2020-05-17 DIAGNOSIS — E875 Hyperkalemia: Secondary | ICD-10-CM

## 2020-05-17 DIAGNOSIS — E1169 Type 2 diabetes mellitus with other specified complication: Secondary | ICD-10-CM | POA: Diagnosis not present

## 2020-05-17 DIAGNOSIS — I152 Hypertension secondary to endocrine disorders: Secondary | ICD-10-CM

## 2020-05-17 DIAGNOSIS — M109 Gout, unspecified: Secondary | ICD-10-CM

## 2020-05-17 DIAGNOSIS — I252 Old myocardial infarction: Secondary | ICD-10-CM

## 2020-05-17 DIAGNOSIS — Z8546 Personal history of malignant neoplasm of prostate: Secondary | ICD-10-CM

## 2020-05-17 DIAGNOSIS — I480 Paroxysmal atrial fibrillation: Secondary | ICD-10-CM

## 2020-05-17 DIAGNOSIS — K5981 Ogilvie syndrome: Secondary | ICD-10-CM

## 2020-05-17 DIAGNOSIS — Z8673 Personal history of transient ischemic attack (TIA), and cerebral infarction without residual deficits: Secondary | ICD-10-CM

## 2020-05-17 DIAGNOSIS — E785 Hyperlipidemia, unspecified: Secondary | ICD-10-CM

## 2020-05-17 DIAGNOSIS — E1159 Type 2 diabetes mellitus with other circulatory complications: Secondary | ICD-10-CM | POA: Diagnosis not present

## 2020-05-17 DIAGNOSIS — G35 Multiple sclerosis: Secondary | ICD-10-CM | POA: Diagnosis not present

## 2020-05-17 MED ORDER — LINAGLIPTIN 5 MG PO TABS: 5.0000 mg | ORAL_TABLET | Freq: Every day | ORAL | 3 refills | Status: DC

## 2020-05-17 MED ORDER — APIXABAN 2.5 MG PO TABS: 2.5000 mg | ORAL_TABLET | Freq: Two times a day (BID) | ORAL | 3 refills | Status: DC

## 2020-05-17 MED ORDER — METOPROLOL SUCCINATE ER 50 MG PO TB24: 50.0000 mg | ORAL_TABLET | Freq: Every day | ORAL | 3 refills | Status: DC

## 2020-05-17 NOTE — Patient Instructions (Addendum)
PHARMACY SHOULD HAVE REFILLS ON FILE FOR  ALLOPURINOL  APIXIBAN  ATORVASTATIN  OXYBUTYNIN  YOU ARE OVER-DUE FOR FOLLOW UP WITH CARDIOLOGY AND NEUROLOGY. I'VE SENT REFERRALS TO BOTH SPECIALTIES. IF OK TO STOP PLAVIX AKA CLOPIDOGREL AND JUST STAY ON THE ELIQUIS AKA APIXIBAN, WILL LET YOU KNOW.

## 2020-05-17 NOTE — Progress Notes (Signed)
[name] is a 80 y.o. male who presents to  [place] at [facility]  today, 05/17/20, seeking care for the following:  Marland Kitchen Medicare visit initially but pt has a few concerns . Paroxysmal atrial fib on Eliquis lower dose d/t CKD, started 09/2019. Also Hx NSTEMI 09/2019. No established w/ cardiology  . Hx CVA on Plavix for years (prior to establishing care with our clinic)   Pt and wife report he has history of MS, this is the first I'm hearing about this diagnosis and cannot find documentation anywhere in available records about this, no MRI brain. Pt is typically a poor historian     ASSESSMENT & PLAN with other pertinent findings:  The primary encounter diagnosis was Hypertension associated with diabetes ([place]). Diagnoses of Type 2 diabetes mellitus with chronic kidney disease, without long-term current use of insulin, unspecified CKD stage ([place]), History of CVA (cerebrovascular accident), Dyslipidemia associated with type 2 diabetes mellitus ([place]), History of MI (myocardial infarction), Paroxysmal atrial fibrillation ([place]), Gout, unspecified cause, unspecified chronicity, unspecified site, History of prostate cancer, and Multiple sclerosis ([place]) were also pertinent to this visit.   Continue Plavix and lower-dose Eliquis for now - concern for fall risk. Would like to deescalate one of these if possible, but his stroke risk and his cardiac risk are considerable. .  Needs to get established w/ cardiology Consult neuro - not sure what to make of this new information about MS? Confirm diagnosis, get records, discuss treatments.     Patient Instructions  PHARMACY SHOULD HAVE REFILLS ON FILE FOR  ALLOPURINOL  APIXIBAN  ATORVASTATIN  OXYBUTYNIN  YOU ARE OVER-DUE FOR FOLLOW UP WITH CARDIOLOGY AND NEUROLOGY. I'VE SENT REFERRALS TO BOTH SPECIALTIES. IF OK TO STOP PLAVIX AKA CLOPIDOGREL AND JUST STAY ON THE ELIQUIS AKA APIXIBAN, WILL LET YOU  KNOW.      Orders Placed This Encounter  Procedures  . CBC  . CMP14+EGFR  . Lipid panel  . Hemoglobin A1c  . PSA Total (Reflex To Free)  . Uric acid  . Ambulatory referral to Cardiology  . Ambulatory referral to Neurology    Meds ordered this encounter  Medications  . linagliptin (TRADJENTA) 5 MG TABS tablet    Sig: Take 1 tablet (5 mg total) by mouth daily.    Dispense:  90 tablet    Refill:  3  . metoprolol succinate (TOPROL-XL) 50 MG 24 hr tablet    Sig: Take 1 tablet (50 mg total) by mouth daily.    Dispense:  90 tablet    Refill:  3  . apixaban (ELIQUIS) 2.5 MG TABS tablet    Sig: Take 1 tablet (2.5 mg total) by mouth 2 (two) times daily.    Dispense:  180 tablet    Refill:  3     See below for relevant physical exam findings  See below for recent lab and imaging results reviewed  Medications, allergies, PMH, PSH, SocH, FamH reviewed below    Follow-up instructions: Return in about 6 months (around 11/16/2020) for ROUTINE CHECK-UP CHRONIC CONDITIONS .                                        Exam:  BP 107/70 (BP Location: Left Arm, Patient Position: Sitting, Cuff Size: Normal)   Pulse (!) 116   Temp 98.1 F (36.7 C) (Oral)  Wt 185 lb 1.3 oz (84 kg)   BMI 27.33 kg/m   Constitutional: VS see above. General Appearance: alert, well-developed, well-nourished, NAD  Neck: No masses, trachea midline.   Respiratory: Normal respiratory effort. no wheeze, no rhonchi, no rales  Cardiovascular: S1/S2 normal, no murmur, no rub/gallop auscultated. RRR.   Neurological: Normal balance/coordination. No tremor.  Skin: warm, dry, intact.   Psychiatric: Fair judgment/insight. Normal mood and affect. Oriented x3.   Current Meds  Medication Sig  . acetaminophen (TYLENOL) 650 MG CR tablet Take 1-2 tablets (650-1,300 mg total) by mouth every 8 (eight) hours as needed for pain.  Marland Kitchen allopurinol (ZYLOPRIM) 300 MG tablet TAKE 1 TABLET BY  MOUTH EVERY DAY  . atorvastatin (LIPITOR) 20 MG tablet Take 1 tablet (20 mg total) by mouth daily.  . clopidogrel (PLAVIX) 75 MG tablet Take 1 tablet (75 mg total) by mouth daily.  Marland Kitchen escitalopram (LEXAPRO) 5 MG/5ML solution Take 20 mLs (20 mg total) by mouth daily.  . fluticasone (FLONASE) 50 MCG/ACT nasal spray One spray in each nostril twice a day, use left hand for right nostril, and right hand for left nostril.  . irbesartan (AVAPRO) 75 MG tablet Take 1 tablet (75 mg total) by mouth daily.  . methocarbamol (ROBAXIN) 500 MG tablet Take 1 tablet (500 mg total) by mouth 2 (two) times daily as needed for muscle spasms.  . nitroGLYCERIN (NITROSTAT) 0.6 MG SL tablet Place 0.6 mg under the tongue every 5 (five) minutes as needed for chest pain.  Marland Kitchen Nystatin POWD Apply liberally to affected area 2 times per day  . oxybutynin (DITROPAN-XL) 10 MG 24 hr tablet Take 1 tablet (10 mg total) by mouth at bedtime.  . traMADol (ULTRAM) 50 MG tablet Take 1 tablet (50 mg total) by mouth every 8 (eight) hours as needed for severe pain.  . [DISCONTINUED] ELIQUIS 2.5 MG TABS tablet TAKE 1 TABLET BY MOUTH TWICE A DAY  . [DISCONTINUED] linagliptin (TRADJENTA) 5 MG TABS tablet Take 1 tablet (5 mg total) by mouth daily.  . [DISCONTINUED] metoprolol succinate (TOPROL-XL) 50 MG 24 hr tablet Take 1 tablet (50 mg total) by mouth daily.    Allergies  Allergen Reactions  . Aspirin Swelling    Other reaction(s): Unknown  . Doxepin Other (See Comments)    Dry mouth Other reaction(s): Other, Other (See Comments)   . Shellfish Allergy   . Sertraline Diarrhea    Patient Active Problem List   Diagnosis Date Noted  . History of MI (myocardial infarction) 10/21/2019  . Diversion colitis 10/07/2019  . Gastroesophageal reflux disease without esophagitis 10/07/2019  . Paroxysmal atrial fibrillation ([name]) 09/16/2019  . Atrial flutter ([name]) 09/15/2019  . NSTEMI (non-ST elevated myocardial infarction) ([name]) 09/15/2019  .  Recurrent major depressive disorder, in partial remission ([name]) 09/15/2019  . Asymptomatic PVCs 07/27/2018  . Melena 04/27/2018  . CKD (chronic kidney disease) stage 3, GFR 30-59 ml/min ([name]) 04/27/2018  . Mild cognitive impairment 04/20/2018  . Microcytic anemia 04/20/2018  . Primary osteoarthritis of both knees 02/17/2018  . Insomnia due to anxiety and fear 01/26/2018  . Corn of left foot, plantar fifth MTP 01/26/2018  . 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 Type 2 diabetes mellitus with chronic kidney disease, without long-term current use of insulin ([name]) 01/06/2018  . Hyperkalemia 01/06/2018  . Elevated serum creatinine 01/06/2018  . History of gout 01/06/2018  . Dyslipidemia associated with type 2 diabetes mellitus ([name]) 01/06/2018  . Dyspepsia  01/06/2018  . OAB (overactive bladder) 01/06/2018  . History of prostate cancer 01/06/2018  . Current moderate episode of major depressive disorder without prior episode ([place]) 01/06/2018  . Anxiety about health 01/06/2018  . Lumbar spinal stenosis 12/15/2017  . Colostomy status ([place]) 12/15/2017  . Hyperlipidemia 12/15/2017  . Hypertension associated with diabetes ([place]) 12/15/2017  . Gout 12/15/2017  . History of CVA (cerebrovascular accident) 12/15/2017  . Ileostomy in place [facility]) 12/15/2017    Family History  Family history unknown: Yes    Social History   Tobacco Use  Smoking Status Former Smoker  Smokeless Tobacco Never Used    Past Surgical History:  Procedure Laterality Date  . CHOLECYSTECTOMY    . COLOSTOMY    . PROSTATE SURGERY      Immunization History  Administered Date(s) Administered  . Fluad Quad(high Dose 65+) 11/17/2019  . Influenza,inj,Quad PF,6+ Mos 10/25/2018  . Moderna Sars-Covid-2 Vaccination 03/25/2019, 04/23/2019  . Pneumococcal Polysaccharide-23 01/26/2018    Recent Results (from the past 2160 hour(s))  CBC     Status: Abnormal   Collection  Time: 05/17/20  3:07 PM  Result Value Ref Range   WBC 7.4 3.4 - 10.8 x10E3/uL   RBC 4.60 4.14 - 5.80 x10E6/uL   Hemoglobin 11.3 (L) 13.0 - 17.7 g/dL   Hematocrit 36.6 (L) 37.5 - 51.0 %   MCV 80 79 - 97 fL   MCH 24.6 (L) 26.6 - 33.0 pg   MCHC 30.9 (L) 31.5 - 35.7 g/dL   RDW 16.7 (H) 11.6 - 15.4 %   Platelets 151 150 - 450 x10E3/uL  CMP14+EGFR     Status: Abnormal   Collection Time: 05/17/20  3:07 PM  Result Value Ref Range   Glucose 73 65 - 99 mg/dL   BUN 27 8 - 27 mg/dL   Creatinine, Ser 1.80 (H) 0.76 - 1.27 mg/dL   eGFR 37 (L) >59 mL/min/1.73   BUN/Creatinine Ratio 15 10 - 24   Sodium 136 134 - 144 mmol/L   Potassium 6.2 (H) 3.5 - 5.2 mmol/L   Chloride 105 96 - 106 mmol/L   CO2 18 (L) 20 - 29 mmol/L   Calcium 9.3 8.6 - 10.2 mg/dL   Total Protein 6.4 6.0 - 8.5 g/dL   Albumin 4.0 3.6 - 4.6 g/dL   Globulin, Total 2.4 1.5 - 4.5 g/dL   Albumin/Globulin Ratio 1.7 1.2 - 2.2   Bilirubin Total 0.6 0.0 - 1.2 mg/dL   Alkaline Phosphatase 144 (H) 44 - 121 IU/L   AST 16 0 - 40 IU/L   ALT 11 0 - 44 IU/L  Lipid panel     Status: Abnormal   Collection Time: 05/17/20  3:07 PM  Result Value Ref Range   Cholesterol, Total 86 (L) 100 - 199 mg/dL   Triglycerides 44 0 - 149 mg/dL   HDL 47 >39 mg/dL   VLDL Cholesterol Cal 12 5 - 40 mg/dL   LDL Chol Calc (NIH) 27 0 - 99 mg/dL   Chol/HDL Ratio 1.8 0.0 - 5.0 ratio    Comment:                                   T. Chol/HDL Ratio  Men  Women                               1/2 Avg.Risk  3.4    3.3                                   Avg.Risk  5.0    4.4                                2X Avg.Risk  9.6    7.1                                3X Avg.Risk 23.4   11.0   Hemoglobin A1c     Status: Abnormal   Collection Time: 05/17/20  3:07 PM  Result Value Ref Range   Hgb A1c MFr Bld 6.5 (H) 4.8 - 5.6 %    Comment:          Prediabetes: 5.7 - 6.4          Diabetes: >6.4          Glycemic control for adults  with diabetes: <7.0    Est. average glucose Bld gHb Est-mCnc 140 mg/dL  PSA Total (Reflex To Free)     Status: None   Collection Time: 05/17/20  3:07 PM  Result Value Ref Range   Prostate Specific Ag, Serum 3.3 0.0 - 4.0 ng/mL    Comment: Roche ECLIA methodology. According to the American Urological Association, Serum PSA should decrease and remain at undetectable levels after radical prostatectomy. The AUA defines biochemical recurrence as an initial PSA value 0.2 ng/mL or greater followed by a subsequent confirmatory PSA value 0.2 ng/mL or greater. Values obtained with different assay methods or kits cannot be used interchangeably. Results cannot be interpreted as absolute evidence of the presence or absence of malignant disease.    Reflex Criteria Comment     Comment: The percent free PSA is performed on a reflex basis only when the total PSA is between 4.0 and 10.0 ng/mL.   Uric acid     Status: Abnormal   Collection Time: 05/17/20  3:07 PM  Result Value Ref Range   Uric Acid 2.6 (L) 3.8 - 8.4 mg/dL    Comment:            Therapeutic target for gout patients: <6.0    No results found.     All questions at time of visit were answered - patient instructed to contact office with any additional concerns or updates. ER/RTC precautions were reviewed with the patient as applicable.   Please note: manual typing as well as voice recognition software may have been used to produce this document - typos may escape review. Please contact Dr. Sheppard Coil for any needed clarifications.   Total encounter time on date of service, 05/17/20, was 40 minutes spent addressing problems/issues as noted above in [place], including time spent in discussion with patient regarding the HPI, ROS, confirming history, reviewing Assessment & Plan, as well as time spent on coordination of care, record review.

## 2020-05-18 LAB — CMP14+EGFR
ALT: 11 IU/L (ref 0–44)
AST: 16 IU/L (ref 0–40)
Albumin/Globulin Ratio: 1.7 (ref 1.2–2.2)
Albumin: 4 g/dL (ref 3.6–4.6)
Alkaline Phosphatase: 144 IU/L — ABNORMAL HIGH (ref 44–121)
BUN/Creatinine Ratio: 15 (ref 10–24)
BUN: 27 mg/dL (ref 8–27)
Bilirubin Total: 0.6 mg/dL (ref 0.0–1.2)
CO2: 18 mmol/L — ABNORMAL LOW (ref 20–29)
Calcium: 9.3 mg/dL (ref 8.6–10.2)
Chloride: 105 mmol/L (ref 96–106)
Creatinine, Ser: 1.8 mg/dL — ABNORMAL HIGH (ref 0.76–1.27)
Globulin, Total: 2.4 g/dL (ref 1.5–4.5)
Glucose: 73 mg/dL (ref 65–99)
Potassium: 6.2 mmol/L — ABNORMAL HIGH (ref 3.5–5.2)
Sodium: 136 mmol/L (ref 134–144)
Total Protein: 6.4 g/dL (ref 6.0–8.5)
eGFR: 37 mL/min/{1.73_m2} — ABNORMAL LOW (ref >59)

## 2020-05-18 LAB — CBC
Hematocrit: 36.6 % — ABNORMAL LOW (ref 37.5–51.0)
Hemoglobin: 11.3 g/dL — ABNORMAL LOW (ref 13.0–17.7)
MCH: 24.6 pg — ABNORMAL LOW (ref 26.6–33.0)
MCHC: 30.9 g/dL — ABNORMAL LOW (ref 31.5–35.7)
MCV: 80 fL (ref 79–97)
Platelets: 151 10*3/uL (ref 150–450)
RBC: 4.6 x10E6/uL (ref 4.14–5.80)
RDW: 16.7 % — ABNORMAL HIGH (ref 11.6–15.4)
WBC: 7.4 10*3/uL (ref 3.4–10.8)

## 2020-05-18 LAB — LIPID PANEL
Chol/HDL Ratio: 1.8 ratio (ref 0.0–5.0)
Cholesterol, Total: 86 mg/dL — ABNORMAL LOW (ref 100–199)
HDL: 47 mg/dL (ref >39)
LDL Chol Calc (NIH): 27 mg/dL (ref 0–99)
Triglycerides: 44 mg/dL (ref 0–149)
VLDL Cholesterol Cal: 12 mg/dL (ref 5–40)

## 2020-05-18 LAB — URIC ACID: Uric Acid: 2.6 mg/dL — ABNORMAL LOW (ref 3.8–8.4)

## 2020-05-18 LAB — PSA TOTAL (REFLEX TO FREE): Prostate Specific Ag, Serum: 3.3 ng/mL (ref 0.0–4.0)

## 2020-05-18 LAB — HEMOGLOBIN A1C
Est. average glucose Bld gHb Est-mCnc: 140 mg/dL
Hgb A1c MFr Bld: 6.5 % — ABNORMAL HIGH (ref 4.8–5.6)

## 2020-05-19 ENCOUNTER — Other Ambulatory Visit: Payer: Self-pay | Admitting: Osteopathic Medicine

## 2020-05-21 ENCOUNTER — Other Ambulatory Visit: Payer: Self-pay | Admitting: Osteopathic Medicine

## 2020-05-21 DIAGNOSIS — Z8739 Personal history of other diseases of the musculoskeletal system and connective tissue: Secondary | ICD-10-CM

## 2020-05-21 MED ORDER — ALLOPURINOL 100 MG PO TABS: 100.0000 mg | ORAL_TABLET | Freq: Every day | ORAL | 1 refills | Status: DC

## 2020-05-21 NOTE — Addendum Note (Signed)
Addended by: Maryla Morrow on: 05/21/2020 05:38 PM   Modules accepted: Orders

## 2020-05-25 DIAGNOSIS — Z432 Encounter for attention to ileostomy: Secondary | ICD-10-CM | POA: Diagnosis not present

## 2020-05-30 ENCOUNTER — Telehealth: Payer: Self-pay | Admitting: Osteopathic Medicine

## 2020-05-30 NOTE — Telephone Encounter (Signed)
Notes placed in your in basket, if there is a way that you can check up on this when you have time.  Patient requested ostomy supplies refilled.  I do not think we have done this for him before, he give [name] a written list of exactly what he needs and the company that supplies them.  He gave [name] a printed list of what he has been getting so far.  I am not sure why he gave this to [name] instead of his surgical team?  If we can contact the medical supply place that he is asking about, [phone] extension [phone] and see if they can just send [name] an order form to sign off on, that would be fine.  Otherwise, he needs to reach out to his surgeon

## 2020-06-08 DIAGNOSIS — G35 Multiple sclerosis: Secondary | ICD-10-CM | POA: Diagnosis not present

## 2020-06-08 DIAGNOSIS — Z8673 Personal history of transient ischemic attack (TIA), and cerebral infarction without residual deficits: Secondary | ICD-10-CM | POA: Diagnosis not present

## 2020-06-08 DIAGNOSIS — I480 Paroxysmal atrial fibrillation: Secondary | ICD-10-CM | POA: Diagnosis not present

## 2020-06-08 DIAGNOSIS — N1831 Chronic kidney disease, stage 3a: Secondary | ICD-10-CM | POA: Diagnosis not present

## 2020-06-08 DIAGNOSIS — E1122 Type 2 diabetes mellitus with diabetic chronic kidney disease: Secondary | ICD-10-CM | POA: Diagnosis not present

## 2020-06-08 DIAGNOSIS — F3341 Major depressive disorder, recurrent, in partial remission: Secondary | ICD-10-CM | POA: Diagnosis not present

## 2020-06-20 DIAGNOSIS — G35 Multiple sclerosis: Secondary | ICD-10-CM | POA: Diagnosis not present

## 2020-06-20 DIAGNOSIS — R9082 White matter disease, unspecified: Secondary | ICD-10-CM | POA: Diagnosis not present

## 2020-06-20 DIAGNOSIS — Z8673 Personal history of transient ischemic attack (TIA), and cerebral infarction without residual deficits: Secondary | ICD-10-CM | POA: Diagnosis not present

## 2020-06-26 ENCOUNTER — Other Ambulatory Visit: Payer: Self-pay | Admitting: Osteopathic Medicine

## 2020-06-26 DIAGNOSIS — E1159 Type 2 diabetes mellitus with other circulatory complications: Secondary | ICD-10-CM

## 2020-06-26 DIAGNOSIS — I152 Hypertension secondary to endocrine disorders: Secondary | ICD-10-CM

## 2020-06-29 NOTE — Telephone Encounter (Signed)
Task completed on 06/25/20. Copy of the completed application scan into patient's chart.

## 2020-08-06 DIAGNOSIS — Z03818 Encounter for observation for suspected exposure to other biological agents ruled out: Secondary | ICD-10-CM | POA: Diagnosis not present

## 2020-08-06 DIAGNOSIS — J00 Acute nasopharyngitis [common cold]: Secondary | ICD-10-CM | POA: Diagnosis not present

## 2020-08-06 DIAGNOSIS — Z20822 Contact with and (suspected) exposure to covid-19: Secondary | ICD-10-CM | POA: Diagnosis not present

## 2020-08-08 DIAGNOSIS — I10 Essential (primary) hypertension: Secondary | ICD-10-CM | POA: Diagnosis not present

## 2020-08-08 DIAGNOSIS — I480 Paroxysmal atrial fibrillation: Secondary | ICD-10-CM | POA: Diagnosis not present

## 2020-08-08 DIAGNOSIS — I25118 Atherosclerotic heart disease of native coronary artery with other forms of angina pectoris: Secondary | ICD-10-CM | POA: Diagnosis not present

## 2020-08-10 DIAGNOSIS — I480 Paroxysmal atrial fibrillation: Secondary | ICD-10-CM | POA: Diagnosis not present

## 2020-08-10 DIAGNOSIS — I25118 Atherosclerotic heart disease of native coronary artery with other forms of angina pectoris: Secondary | ICD-10-CM | POA: Diagnosis not present

## 2020-08-10 DIAGNOSIS — I10 Essential (primary) hypertension: Secondary | ICD-10-CM | POA: Diagnosis not present

## 2020-08-11 DIAGNOSIS — Z886 Allergy status to analgesic agent status: Secondary | ICD-10-CM | POA: Diagnosis not present

## 2020-08-11 DIAGNOSIS — S0990XA Unspecified injury of head, initial encounter: Secondary | ICD-10-CM | POA: Diagnosis not present

## 2020-08-11 DIAGNOSIS — Z7902 Long term (current) use of antithrombotics/antiplatelets: Secondary | ICD-10-CM | POA: Diagnosis not present

## 2020-08-11 DIAGNOSIS — E785 Hyperlipidemia, unspecified: Secondary | ICD-10-CM | POA: Diagnosis not present

## 2020-08-11 DIAGNOSIS — N183 Chronic kidney disease, stage 3 unspecified: Secondary | ICD-10-CM | POA: Diagnosis not present

## 2020-08-11 DIAGNOSIS — Z8546 Personal history of malignant neoplasm of prostate: Secondary | ICD-10-CM | POA: Diagnosis not present

## 2020-08-11 DIAGNOSIS — E1122 Type 2 diabetes mellitus with diabetic chronic kidney disease: Secondary | ICD-10-CM | POA: Diagnosis not present

## 2020-08-11 DIAGNOSIS — Z91013 Allergy to seafood: Secondary | ICD-10-CM | POA: Diagnosis not present

## 2020-08-11 DIAGNOSIS — I129 Hypertensive chronic kidney disease with stage 1 through stage 4 chronic kidney disease, or unspecified chronic kidney disease: Secondary | ICD-10-CM | POA: Diagnosis not present

## 2020-08-11 DIAGNOSIS — Z888 Allergy status to other drugs, medicaments and biological substances status: Secondary | ICD-10-CM | POA: Diagnosis not present

## 2020-08-11 DIAGNOSIS — Z79899 Other long term (current) drug therapy: Secondary | ICD-10-CM | POA: Diagnosis not present

## 2020-08-11 DIAGNOSIS — Z7901 Long term (current) use of anticoagulants: Secondary | ICD-10-CM | POA: Diagnosis not present

## 2020-08-11 DIAGNOSIS — Z8673 Personal history of transient ischemic attack (TIA), and cerebral infarction without residual deficits: Secondary | ICD-10-CM | POA: Diagnosis not present

## 2020-08-11 DIAGNOSIS — Z87891 Personal history of nicotine dependence: Secondary | ICD-10-CM | POA: Diagnosis not present

## 2020-08-11 DIAGNOSIS — R296 Repeated falls: Secondary | ICD-10-CM | POA: Diagnosis not present

## 2020-08-11 DIAGNOSIS — S0101XA Laceration without foreign body of scalp, initial encounter: Secondary | ICD-10-CM | POA: Diagnosis not present

## 2020-08-11 DIAGNOSIS — S0003XA Contusion of scalp, initial encounter: Secondary | ICD-10-CM | POA: Diagnosis not present

## 2020-08-12 DIAGNOSIS — Z20822 Contact with and (suspected) exposure to covid-19: Secondary | ICD-10-CM | POA: Diagnosis not present

## 2020-08-14 DIAGNOSIS — E785 Hyperlipidemia, unspecified: Secondary | ICD-10-CM | POA: Diagnosis not present

## 2020-08-14 DIAGNOSIS — F419 Anxiety disorder, unspecified: Secondary | ICD-10-CM | POA: Diagnosis not present

## 2020-08-14 DIAGNOSIS — I129 Hypertensive chronic kidney disease with stage 1 through stage 4 chronic kidney disease, or unspecified chronic kidney disease: Secondary | ICD-10-CM | POA: Diagnosis not present

## 2020-08-14 DIAGNOSIS — Z8673 Personal history of transient ischemic attack (TIA), and cerebral infarction without residual deficits: Secondary | ICD-10-CM | POA: Diagnosis not present

## 2020-08-14 DIAGNOSIS — E1122 Type 2 diabetes mellitus with diabetic chronic kidney disease: Secondary | ICD-10-CM | POA: Diagnosis not present

## 2020-08-14 DIAGNOSIS — Z87891 Personal history of nicotine dependence: Secondary | ICD-10-CM | POA: Diagnosis not present

## 2020-08-14 DIAGNOSIS — R112 Nausea with vomiting, unspecified: Secondary | ICD-10-CM | POA: Diagnosis not present

## 2020-08-14 DIAGNOSIS — R450 Nervousness: Secondary | ICD-10-CM | POA: Diagnosis not present

## 2020-08-14 DIAGNOSIS — Z79899 Other long term (current) drug therapy: Secondary | ICD-10-CM | POA: Diagnosis not present

## 2020-08-14 DIAGNOSIS — Z96643 Presence of artificial hip joint, bilateral: Secondary | ICD-10-CM | POA: Diagnosis not present

## 2020-08-14 DIAGNOSIS — Z7901 Long term (current) use of anticoagulants: Secondary | ICD-10-CM | POA: Diagnosis not present

## 2020-08-14 DIAGNOSIS — Z888 Allergy status to other drugs, medicaments and biological substances status: Secondary | ICD-10-CM | POA: Diagnosis not present

## 2020-08-14 DIAGNOSIS — Z933 Colostomy status: Secondary | ICD-10-CM | POA: Diagnosis not present

## 2020-08-14 DIAGNOSIS — N183 Chronic kidney disease, stage 3 unspecified: Secondary | ICD-10-CM | POA: Diagnosis not present

## 2020-08-14 DIAGNOSIS — Z8546 Personal history of malignant neoplasm of prostate: Secondary | ICD-10-CM | POA: Diagnosis not present

## 2020-08-14 DIAGNOSIS — Z91013 Allergy to seafood: Secondary | ICD-10-CM | POA: Diagnosis not present

## 2020-08-14 DIAGNOSIS — Z7902 Long term (current) use of antithrombotics/antiplatelets: Secondary | ICD-10-CM | POA: Diagnosis not present

## 2020-08-14 DIAGNOSIS — R410 Disorientation, unspecified: Secondary | ICD-10-CM | POA: Diagnosis not present

## 2020-08-14 DIAGNOSIS — R9431 Abnormal electrocardiogram [ECG] [EKG]: Secondary | ICD-10-CM | POA: Diagnosis not present

## 2020-08-14 DIAGNOSIS — Z886 Allergy status to analgesic agent status: Secondary | ICD-10-CM | POA: Diagnosis not present

## 2020-08-15 DIAGNOSIS — Z20822 Contact with and (suspected) exposure to covid-19: Secondary | ICD-10-CM | POA: Diagnosis not present

## 2020-08-16 ENCOUNTER — Other Ambulatory Visit: Payer: Self-pay | Admitting: Osteopathic Medicine

## 2020-08-16 DIAGNOSIS — Z8673 Personal history of transient ischemic attack (TIA), and cerebral infarction without residual deficits: Secondary | ICD-10-CM

## 2020-08-17 DIAGNOSIS — S0101XD Laceration without foreign body of scalp, subsequent encounter: Secondary | ICD-10-CM | POA: Diagnosis not present

## 2020-08-17 DIAGNOSIS — Z4802 Encounter for removal of sutures: Secondary | ICD-10-CM | POA: Diagnosis not present

## 2020-08-20 DIAGNOSIS — I480 Paroxysmal atrial fibrillation: Secondary | ICD-10-CM | POA: Diagnosis not present

## 2020-08-20 DIAGNOSIS — I10 Essential (primary) hypertension: Secondary | ICD-10-CM | POA: Diagnosis not present

## 2020-08-20 DIAGNOSIS — I25118 Atherosclerotic heart disease of native coronary artery with other forms of angina pectoris: Secondary | ICD-10-CM | POA: Diagnosis not present

## 2020-09-04 ENCOUNTER — Encounter: Payer: Self-pay | Admitting: Osteopathic Medicine

## 2020-09-04 ENCOUNTER — Ambulatory Visit (INDEPENDENT_AMBULATORY_CARE_PROVIDER_SITE_OTHER): Payer: Medicare Other | Admitting: Osteopathic Medicine

## 2020-09-04 ENCOUNTER — Other Ambulatory Visit: Payer: Self-pay

## 2020-09-04 VITALS — BP 129/65 | HR 65 | Temp 98.0°F | Wt 188.1 lb

## 2020-09-04 DIAGNOSIS — Z932 Ileostomy status: Secondary | ICD-10-CM

## 2020-09-04 DIAGNOSIS — E1159 Type 2 diabetes mellitus with other circulatory complications: Secondary | ICD-10-CM

## 2020-09-04 DIAGNOSIS — R262 Difficulty in walking, not elsewhere classified: Secondary | ICD-10-CM

## 2020-09-04 DIAGNOSIS — Z8673 Personal history of transient ischemic attack (TIA), and cerebral infarction without residual deficits: Secondary | ICD-10-CM | POA: Diagnosis not present

## 2020-09-04 DIAGNOSIS — E1122 Type 2 diabetes mellitus with diabetic chronic kidney disease: Secondary | ICD-10-CM

## 2020-09-04 DIAGNOSIS — R2681 Unsteadiness on feet: Secondary | ICD-10-CM | POA: Diagnosis not present

## 2020-09-04 DIAGNOSIS — I480 Paroxysmal atrial fibrillation: Secondary | ICD-10-CM | POA: Diagnosis not present

## 2020-09-04 DIAGNOSIS — M48062 Spinal stenosis, lumbar region with neurogenic claudication: Secondary | ICD-10-CM

## 2020-09-04 DIAGNOSIS — I152 Hypertension secondary to endocrine disorders: Secondary | ICD-10-CM

## 2020-09-04 LAB — POCT GLYCOSYLATED HEMOGLOBIN (HGB A1C): Hemoglobin A1C: 5.6 % (ref 4.0–5.6)

## 2020-09-04 MED ORDER — AMBULATORY NON FORMULARY MEDICATION: 99 refills | Status: DC

## 2020-09-04 NOTE — Patient Instructions (Signed)
Weakness: will work on getting a scooter for you. Follow as directed w/ your neurologist.   Sugars: looking good, no change to medications.

## 2020-09-04 NOTE — Progress Notes (Signed)
Damon Acosta is a 80 y.o. male who presents to  [place] [facility]  today, 09/04/20, seeking care for the following:  Monitor chronic conditions as below      [name] with other pertinent findings:  The primary encounter diagnosis was Type 2 diabetes mellitus with chronic kidney disease, without long-term current use of insulin, unspecified CKD stage ([name]). Diagnoses of Hypertension associated with diabetes ([place]), Paroxysmal atrial fibrillation ([place]), Ambulatory dysfunction, Gait instability, Spinal stenosis of lumbar region with neurogenic claudication, History of CVA (cerebrovascular accident), and Ileostomy in place [facility]) were also pertinent to this visit.   1. Type 2 diabetes mellitus with chronic kidney disease, without long-term current use of insulin, unspecified CKD stage ([name]) A1C shows good control Patient denies hypoglycemia   2. Hypertension associated with diabetes ([place]) BP Readings from Last 3 Encounters:  09/04/20 129/65  05/17/20 107/70  04/03/20 104/64  No hypotensive sytmposm   3. Paroxysmal atrial fibrillation ([name]) Following w/ cardilogy, adherent to eliquis   4. Ambulatory dysfunction ~CVA, lumbar DDD w/ neurogenic claudication  Rx for scooter to medical supply Advised continue f/u w/ neurosurgery as directed / PRN Anticipate paperwork  Tramadol refilled for pain    Patient Instructions  Weakness: will work on getting a scooter for you. Follow as directed w/ your neurologist.   Sugars: looking good, no change to medications.    Orders Placed This Encounter  Procedures   POCT HgB A1C    Meds ordered this encounter  Medications   AMBULATORY NON FORMULARY MEDICATION    Sig: MOTORIZED SCOOTER, DX AMBULATORY DYSFUNCTION, LOWER EXTREMITY WEAKNESS ON LEFT, MULTIPLE SCLEROSIS    Dispense:  1 Units    Refill:  99   AMBULATORY NON FORMULARY MEDICATION    Sig: Motorized wheelchair/scooter per  patient preference and insurance coverage. Please fax to any medical supply company local to [place]   Dx: (E11.22) Type 2 diabetes mellitus with chronic kidney disease, without long-term current use of insulin, unspecified CKD stage ([name])  (primary encounter diagnosis) (E11.59,  I15.2) Hypertension associated with diabetes ([name]) (I48.0) Paroxysmal atrial fibrillation ([name]) (R26.2) Ambulatory dysfunction (R26.81) Gait instability (M48.062) Spinal stenosis of lumbar region with neurogenic claudication (Z86.73) History of CVA (cerebrovascular accident) (Z93.2) Ileostomy in place ([name])    Dispense:  1 Units    Refill:  99   traMADol (ULTRAM) 50 MG tablet    Sig: Take 1 tablet (50 mg total) by mouth every 8 (eight) hours as needed for severe pain.    Dispense:  90 tablet    Refill:  0      See below for relevant physical exam findings  See below for recent lab and imaging results reviewed  Medications, allergies, PMH, PSH, SocH, FamH reviewed below    Follow-up instructions: Return in about 6 months (around 03/07/2021) for routine check-up chronic conditions, monitor A1C, establish new provider, see [name] sooner if needed!.                                        Exam:  BP 129/65 (BP Location: Left Arm, Patient Position: Sitting, Cuff Size: Normal)   Pulse 65   Temp 98 F (36.7 C) (Oral)   Wt 188 lb 1.3 oz (85.3 kg)   BMI 27.77 kg/m  Constitutional: VS see above. General Appearance: alert, well-developed, well-nourished, NAD Neck: No masses,  trachea midline.  Respiratory: Normal respiratory effort. no wheeze, no rhonchi, no rales Cardiovascular: S1/S2 normal, no rub/gallop auscultated. RRR.  Neurological: Normal balance/coordination. No tremor. Skin: warm, dry, intact.  Psychiatric: Normal judgment/insight. Normal mood and affect. Oriented x3.   Current Meds  Medication Sig   acetaminophen (TYLENOL) 650 MG CR tablet Take 1-2 tablets  (650-1,300 mg total) by mouth every 8 (eight) hours as needed for pain.   allopurinol (ZYLOPRIM) 100 MG tablet Take 1 tablet (100 mg total) by mouth daily.   AMBULATORY NON FORMULARY MEDICATION MOTORIZED SCOOTER, DX AMBULATORY DYSFUNCTION, LOWER EXTREMITY WEAKNESS ON LEFT, MULTIPLE SCLEROSIS   AMBULATORY NON FORMULARY MEDICATION Motorized wheelchair/scooter per patient preference and insurance coverage. Please fax to any medical supply company local to [place]   Dx: (E11.22) Type 2 diabetes mellitus with chronic kidney disease, without long-term current use of insulin, unspecified CKD stage ([name])  (primary encounter diagnosis) (E11.59,  I15.2) Hypertension associated with diabetes ([name]) (I48.0) Paroxysmal atrial fibrillation ([name]) (R26.2) Ambulatory dysfunction (R26.81) Gait instability (M48.062) Spinal stenosis of lumbar region with neurogenic claudication (Z86.73) History of CVA (cerebrovascular accident) (Z93.2) Ileostomy in place ([name])   apixaban (ELIQUIS) 2.5 MG TABS tablet Take 1 tablet (2.5 mg total) by mouth 2 (two) times daily.   atorvastatin (LIPITOR) 20 MG tablet Take 1 tablet (20 mg total) by mouth daily.   clopidogrel (PLAVIX) 75 MG tablet Take 1 tablet (75 mg total) by mouth daily.   escitalopram (LEXAPRO) 5 MG/5ML solution Take 20 mLs (20 mg total) by mouth daily.   fluticasone (FLONASE) 50 MCG/ACT nasal spray One spray in each nostril twice a day, use left hand for right nostril, and right hand for left nostril.   irbesartan (AVAPRO) 75 MG tablet Take 1 tablet (75 mg total) by mouth daily.   linagliptin (TRADJENTA) 5 MG TABS tablet Take 1 tablet (5 mg total) by mouth daily.   methocarbamol (ROBAXIN) 500 MG tablet Take 1 tablet (500 mg total) by mouth 2 (two) times daily as needed for muscle spasms.   metoprolol succinate (TOPROL-XL) 50 MG 24 hr tablet Take 1 tablet (50 mg total) by mouth daily.   nitroGLYCERIN (NITROSTAT) 0.6 MG SL tablet Place 0.6 mg under the tongue  every 5 (five) minutes as needed for chest pain.   Nystatin POWD Apply liberally to affected area 2 times per day   oxybutynin (DITROPAN-XL) 10 MG 24 hr tablet Take 1 tablet (10 mg total) by mouth at bedtime.    Allergies  Allergen Reactions   Aspirin Swelling    Other reaction(s): Unknown   Doxepin Other (See Comments)    Dry mouth Other reaction(s): Other, Other (See Comments)    Shellfish Allergy Swelling   Sertraline Diarrhea    Patient Active Problem List   Diagnosis Date Noted   History of MI (myocardial infarction) 10/21/2019   Diversion colitis 10/07/2019   Gastroesophageal reflux disease without esophagitis 10/07/2019   Paroxysmal atrial fibrillation ([place]) 09/16/2019   Atrial flutter ([place]) 09/15/2019   NSTEMI (non-ST elevated myocardial infarction) ([place]) 09/15/2019   Recurrent major depressive disorder, in partial remission ([place]) 09/15/2019   Asymptomatic PVCs 07/27/2018   CKD (chronic kidney disease) stage 3, GFR 30-59 ml/min ([name]) 04/27/2018   Mild cognitive impairment 04/20/2018   Microcytic anemia 04/20/2018   Primary osteoarthritis of both knees 02/17/2018   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   Type 2 diabetes mellitus with chronic kidney disease, without long-term current use of insulin ([place]) 01/06/2018   Dyslipidemia associated with type 2  diabetes mellitus ([place]) 01/06/2018   OAB (overactive bladder) 01/06/2018   History of prostate cancer 01/06/2018   Lumbar spinal stenosis 12/15/2017   Hypertension associated with diabetes ([place]) 12/15/2017   Gout 12/15/2017   History of CVA (cerebrovascular accident) 12/15/2017   Ileostomy in place [facility]) 12/15/2017    Family History  Family history unknown: Yes    Social History   Tobacco Use  Smoking Status Former  Smokeless Tobacco Never    Past Surgical History:  Procedure Laterality Date   CHOLECYSTECTOMY     COLOSTOMY     PROSTATE SURGERY      Immunization History  Administered Date(s) Administered    Fluad Quad(high Dose 65+) 11/17/2019   Influenza,inj,Quad PF,6+ Mos 10/25/2018   Influenza-Unspecified 11/17/2019   Moderna Sars-Covid-2 Vaccination 03/25/2019, 04/23/2019   Pneumococcal Polysaccharide-23 01/26/2018   Pneumococcal-Unspecified 01/26/2018    Recent Results (from the past 2160 hour(s))  POCT HgB A1C     Status: None   Collection Time: 09/04/20  3:03 PM  Result Value Ref Range   Hemoglobin A1C 5.6 4.0 - 5.6 %   HbA1c POC (<> result, manual entry)     HbA1c, POC (prediabetic range)     HbA1c, POC (controlled diabetic range)      No results found.     All questions at time of visit were answered - patient instructed to contact office with any additional concerns or updates. ER/RTC precautions were reviewed with the patient as applicable.   Please note: manual typing as well as voice recognition software may have been used to produce this document - typos may escape review. Please contact Dr. Sheppard Coil for any needed clarifications.   Total encounter time on date of service, was 40 minutes spent addressing problems/issues as noted above in [place], including time spent in discussion with patient regarding the HPI, ROS, confirming history, reviewing Assessment & Plan, as well as time spent on coordination of care, record review.

## 2020-09-07 MED ORDER — AMBULATORY NON FORMULARY MEDICATION: 99 refills | Status: AC

## 2020-09-07 MED ORDER — TRAMADOL HCL 50 MG PO TABS: 50.0000 mg | ORAL_TABLET | Freq: Three times a day (TID) | ORAL | 0 refills | Status: DC | PRN

## 2020-09-14 NOTE — Progress Notes (Deleted)
Referring-Damon Alexander, DO Reason for referral-coronary artery disease and paroxysmal atrial fibrillation  HPI: 80 year old male for evaluation of coronary artery disease and atrial fibrillation at request of Emeterio Reeve, DO.  Previously followed at [facility].  Cardiac catheterization August 2021 showed 50% proximal LAD, 99% small first diagonal, 60 to 70% ramus intermedius, 60 to 70% distal left circumflex and 60% RCA.  Medical therapy recommended.  Echocardiogram July 2022 showed normal LV function, mild left ventricular hypertrophy, mild left atrial enlargement, mild right atrial and right ventricular enlargement, mild mitral regurgitation, mild to moderate tricuspid regurgitation.  Also with history of paroxysmal atrial fibrillation.  Probably a clean  Current Outpatient Medications  Medication Sig Dispense Refill   acetaminophen (TYLENOL) 650 MG CR tablet Take 1-2 tablets (650-1,300 mg total) by mouth every 8 (eight) hours as needed for pain. 90 tablet 3   allopurinol (ZYLOPRIM) 100 MG tablet Take 1 tablet (100 mg total) by mouth daily. 90 tablet 1   AMBULATORY NON FORMULARY MEDICATION MOTORIZED SCOOTER, DX AMBULATORY DYSFUNCTION, LOWER EXTREMITY WEAKNESS ON LEFT, MULTIPLE SCLEROSIS 1 Units 99   AMBULATORY NON FORMULARY MEDICATION Motorized wheelchair/scooter per patient preference and insurance coverage. Please fax to any medical supply company local to [place]   Dx: (E11.22) Type 2 diabetes mellitus with chronic kidney disease, without long-term current use of insulin, unspecified CKD stage ([place])  (primary encounter diagnosis) (E11.59,  I15.2) Hypertension associated with diabetes ([place]) (I48.0) Paroxysmal atrial fibrillation ([name]) (R26.2) Ambulatory dysfunction (R26.81) Gait instability (M48.062) Spinal stenosis of lumbar region with neurogenic claudication (Z86.73) History of CVA (cerebrovascular accident) (Z93.2) Ileostomy in place ([place]) 1 Units 99   apixaban  (ELIQUIS) 2.5 MG TABS tablet Take 1 tablet (2.5 mg total) by mouth 2 (two) times daily. 180 tablet 3   atorvastatin (LIPITOR) 20 MG tablet Take 1 tablet (20 mg total) by mouth daily. 90 tablet 3   clopidogrel (PLAVIX) 75 MG tablet Take 1 tablet (75 mg total) by mouth daily. 90 tablet 3   escitalopram (LEXAPRO) 5 MG/5ML solution Take 20 mLs (20 mg total) by mouth daily. 1800 mL 3   fluticasone (FLONASE) 50 MCG/ACT nasal spray One spray in each nostril twice a day, use left hand for right nostril, and right hand for left nostril. 48 g 3   irbesartan (AVAPRO) 75 MG tablet Take 1 tablet (75 mg total) by mouth daily. 90 tablet 3   linagliptin (TRADJENTA) 5 MG TABS tablet Take 1 tablet (5 mg total) by mouth daily. 90 tablet 3   methocarbamol (ROBAXIN) 500 MG tablet Take 1 tablet (500 mg total) by mouth 2 (two) times daily as needed for muscle spasms. 90 tablet 2   metoprolol succinate (TOPROL-XL) 50 MG 24 hr tablet Take 1 tablet (50 mg total) by mouth daily. 90 tablet 3   nitroGLYCERIN (NITROSTAT) 0.6 MG SL tablet Place 0.6 mg under the tongue every 5 (five) minutes as needed for chest pain.     Nystatin POWD Apply liberally to affected area 2 times per day 3 each 11   oxybutynin (DITROPAN-XL) 10 MG 24 hr tablet Take 1 tablet (10 mg total) by mouth at bedtime. 90 tablet 3   traMADol (ULTRAM) 50 MG tablet Take 1 tablet (50 mg total) by mouth every 8 (eight) hours as needed for severe pain. 90 tablet 0   No current facility-administered medications for this visit.    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   Overactive bladder    Sleep apnea    Stroke [facility])     Past Surgical History:  Procedure Laterality Date   CHOLECYSTECTOMY     COLOSTOMY     PROSTATE SURGERY      Social History   Socioeconomic History   Marital status: Married    Spouse name: Not on file   Number of children: Not on file   Years of education: Not on file   Highest education level: Not on file  Occupational History   Not on file  Tobacco Use   Smoking status: Former   Smokeless tobacco: Never  Substance and Sexual Activity   Alcohol use: Not Currently   Drug use: Never   Sexual activity: Not Currently  Other Topics Concern   Not on file  Social History Narrative   Not on file   Social Determinants of Health   Financial Resource Strain: Not on file  Food Insecurity: Not on file  Transportation Needs: Not on file  Physical Activity: Not on file  Stress: Not on file  Social Connections: Not on file  Intimate Partner Violence: Not on file    Family History  Family history unknown: Yes    ROS: no fevers or chills, productive cough, hemoptysis, dysphasia, odynophagia, melena, hematochezia, dysuria, hematuria, rash, seizure activity, orthopnea, PND, pedal edema, claudication. Remaining systems are negative.  Physical Exam:   There were no vitals taken for this visit.  General:  Well developed/well nourished in NAD Skin warm/dry Patient not depressed No peripheral clubbing Back-normal HEENT-normal/normal eyelids Neck supple/normal carotid upstroke bilaterally; no bruits; no JVD; no thyromegaly chest - CTA/ normal expansion CV - RRR/normal S1 and S2; no murmurs, rubs or gallops;  PMI nondisplaced Abdomen -NT/ND, no HSM, no mass, + bowel sounds, no bruit 2+ femoral pulses, no bruits Ext-no edema, chords, 2+ DP Neuro-grossly nonfocal  ECG - personally reviewed  A/P  1 coronary artery disease-patient denies chest pain.  Plan to continue  medical therapy.  Continue statin.  We will continue Plavix through the end of August which will be 1 year from his previous non-ST elevation myocardial infarction.  We will then discontinue given need for apixaban.  2 history of paroxysmal atrial fibrillation-plan to continue apixaban.  Most recent echocardiogram showed preserved LV function.  3 hyperlipidemia-continue statin.  4 hypertension-blood pressure controlled.  Continue present medications.  5 OSA-  Kirk Ruths, MD

## 2020-09-19 ENCOUNTER — Ambulatory Visit: Payer: Medicare Other | Admitting: Cardiology

## 2020-09-19 ENCOUNTER — Other Ambulatory Visit: Payer: Self-pay

## 2020-09-24 DIAGNOSIS — I25118 Atherosclerotic heart disease of native coronary artery with other forms of angina pectoris: Secondary | ICD-10-CM | POA: Diagnosis not present

## 2020-09-24 DIAGNOSIS — I480 Paroxysmal atrial fibrillation: Secondary | ICD-10-CM | POA: Diagnosis not present

## 2020-09-24 DIAGNOSIS — I10 Essential (primary) hypertension: Secondary | ICD-10-CM | POA: Diagnosis not present

## 2020-10-17 DIAGNOSIS — Z23 Encounter for immunization: Secondary | ICD-10-CM | POA: Diagnosis not present

## 2020-11-01 NOTE — Progress Notes (Deleted)
Referring-Damon Alexander, DO Reason for referral-paroxysmal atrial fibrillation and coronary artery disease  HPI: 80 year old male for evaluation of paroxysmal atrial fibrillation and coronary artery disease at request of Emeterio Reeve, DO.  Previously followed at [facility].  Cardiac catheterization August 2021 showed 50% proximal LAD, 99% small ostial diagonal, 60 to 70% proximal ramus, 60 to 70% distal circumflex and 60% mid right coronary artery.  Medical therapy recommended after discussions of coronary artery bypass graft.  Monitor July 2022 showed persistent atrial fibrillation with an average heart rate of 79, minimum 63 and maximum 155.  Echocardiogram July 2022 showed normal LV function, mild left ventricular hypertrophy, mild left atrial enlargement, mild right atrial enlargement, mild right ventricular enlargement, mild mitral vegetation, mild to moderate tricuspid regurgitation.  Current Outpatient Medications  Medication Sig Dispense Refill   acetaminophen (TYLENOL) 650 MG CR tablet Take 1-2 tablets (650-1,300 mg total) by mouth every 8 (eight) hours as needed for pain. 90 tablet 3   allopurinol (ZYLOPRIM) 100 MG tablet Take 1 tablet (100 mg total) by mouth daily. 90 tablet 1   AMBULATORY NON FORMULARY MEDICATION MOTORIZED SCOOTER, DX AMBULATORY DYSFUNCTION, LOWER EXTREMITY WEAKNESS ON LEFT, MULTIPLE SCLEROSIS 1 Units 99   AMBULATORY NON FORMULARY MEDICATION Motorized wheelchair/scooter per patient preference and insurance coverage. Please fax to any medical supply company local to [place]   Dx: (E11.22) Type 2 diabetes mellitus with chronic kidney disease, without long-term current use of insulin, unspecified CKD stage ([place])  (primary encounter diagnosis) (E11.59,  I15.2) Hypertension associated with diabetes ([place]) (I48.0) Paroxysmal atrial fibrillation ([name]) (R26.2) Ambulatory dysfunction (R26.81) Gait instability (M48.062) Spinal stenosis of lumbar region with  neurogenic claudication (Z86.73) History of CVA (cerebrovascular accident) (Z93.2) Ileostomy in place ([place]) 1 Units 99   apixaban (ELIQUIS) 2.5 MG TABS tablet Take 1 tablet (2.5 mg total) by mouth 2 (two) times daily. 180 tablet 3   atorvastatin (LIPITOR) 20 MG tablet Take 1 tablet (20 mg total) by mouth daily. 90 tablet 3   clopidogrel (PLAVIX) 75 MG tablet Take 1 tablet (75 mg total) by mouth daily. 90 tablet 3   escitalopram (LEXAPRO) 5 MG/5ML solution Take 20 mLs (20 mg total) by mouth daily. 1800 mL 3   fluticasone (FLONASE) 50 MCG/ACT nasal spray One spray in each nostril twice a day, use left hand for right nostril, and right hand for left nostril. 48 g 3   irbesartan (AVAPRO) 75 MG tablet Take 1 tablet (75 mg total) by mouth daily. 90 tablet 3   linagliptin (TRADJENTA) 5 MG TABS tablet Take 1 tablet (5 mg total) by mouth daily. 90 tablet 3   methocarbamol (ROBAXIN) 500 MG tablet Take 1 tablet (500 mg total) by mouth 2 (two) times daily as needed for muscle spasms. 90 tablet 2   metoprolol succinate (TOPROL-XL) 50 MG 24 hr tablet Take 1 tablet (50 mg total) by mouth daily. 90 tablet 3   nitroGLYCERIN (NITROSTAT) 0.6 MG SL tablet Place 0.6 mg under the tongue every 5 (five) minutes as needed for chest pain.     Nystatin POWD Apply liberally to affected area 2 times per day 3 each 11   oxybutynin (DITROPAN-XL) 10 MG 24 hr tablet Take 1 tablet (10 mg total) by mouth at bedtime. 90 tablet 3   traMADol (ULTRAM) 50 MG tablet Take 1 tablet (50 mg total) by mouth every 8 (eight) hours as needed for severe pain. 90 tablet 0   No current facility-administered medications for this visit.  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   Overactive bladder    Sleep apnea    Stroke [facility])     Past Surgical History:  Procedure Laterality Date   CHOLECYSTECTOMY     COLOSTOMY     PROSTATE SURGERY      Social History   Socioeconomic History   Marital status: Married    Spouse name: Not on file   Number of children: Not on file   Years of education: Not on file   Highest education level: Not on file  Occupational History   Not on file  Tobacco Use   Smoking status: Former   Smokeless tobacco: Never  Substance and Sexual Activity   Alcohol use: Not Currently   Drug use: Never   Sexual activity: Not Currently  Other Topics Concern   Not on file  Social History Narrative   Not on file   Social Determinants of Health   Financial Resource Strain: Not on file  Food Insecurity: Not on file  Transportation Needs: Not on file  Physical Activity: Not on file  Stress: Not on file  Social Connections: Not on file  Intimate Partner Violence: Not on file    Family History  Family history unknown: Yes    ROS: no fevers or chills, productive cough, hemoptysis, dysphasia, odynophagia, melena, hematochezia, dysuria, hematuria, rash, seizure activity, orthopnea, PND, pedal edema, claudication. Remaining systems are negative.  Physical Exam:   There were no vitals taken for this visit.  General:  Well developed/well nourished in NAD Skin warm/dry Patient not depressed No peripheral clubbing Back-normal HEENT-normal/normal eyelids Neck supple/normal carotid upstroke bilaterally; no bruits; no JVD; no thyromegaly chest - CTA/ normal expansion CV - RRR/normal S1 and S2; no murmurs, rubs or gallops;  PMI nondisplaced Abdomen -NT/ND, no HSM, no mass, + bowel sounds, no bruit 2+ femoral pulses, no bruits Ext-no edema, chords, 2+  DP Neuro-grossly nonfocal  ECG - personally reviewed  A/P  1 persistent atrial fibrillation-  2 coronary artery disease-  3 hypertension-blood pressure controlled.  Continue present medications.  4 hyperlipidemia-  5 obstructive sleep apnea-  Kirk Ruths, MD

## 2020-11-05 ENCOUNTER — Ambulatory Visit: Payer: Medicare Other | Admitting: Cardiology

## 2020-11-05 ENCOUNTER — Other Ambulatory Visit: Payer: Self-pay

## 2020-11-06 ENCOUNTER — Telehealth: Payer: Self-pay | Admitting: Sports Medicine

## 2020-11-06 ENCOUNTER — Ambulatory Visit (INDEPENDENT_AMBULATORY_CARE_PROVIDER_SITE_OTHER): Payer: Medicare Other

## 2020-11-06 ENCOUNTER — Ambulatory Visit (INDEPENDENT_AMBULATORY_CARE_PROVIDER_SITE_OTHER): Payer: Medicare Other | Admitting: Sports Medicine

## 2020-11-06 DIAGNOSIS — M170 Bilateral primary osteoarthritis of knee: Secondary | ICD-10-CM | POA: Diagnosis not present

## 2020-11-06 NOTE — Telephone Encounter (Signed)
Please work on bilateral Orthovisc approval, x-ray confirmed osteoarthritis, failed steroid injections, Orthovisc worked well a year ago.  If Orthovisc not approved any viscosupplement is acceptable.

## 2020-11-06 NOTE — Progress Notes (Signed)
    Procedures performed today:    Procedure: Real-time Ultrasound Guided injection of the left knee Device: Samsung HS60  Verbal informed consent obtained.  Time-out conducted.  Noted no overlying erythema, induration, or other signs of local infection.  Skin prepped in a sterile fashion.  Local anesthesia: Topical Ethyl chloride.  With sterile technique and under real time ultrasound guidance: Trace effusion noted, 1 cc Kenalog 40, 2 cc lidocaine, 2 cc bupivacaine injected easily Completed without difficulty  Advised to call if fevers/chills, erythema, induration, drainage, or persistent bleeding.  Images permanently stored and available for review in PACS.  Impression: Technically successful ultrasound guided injection.  Procedure: Real-time Ultrasound Guided injection of the right knee Device: Samsung HS60  Verbal informed consent obtained.  Time-out conducted.  Noted no overlying erythema, induration, or other signs of local infection.  Skin prepped in a sterile fashion.  Local anesthesia: Topical Ethyl chloride.  With sterile technique and under real time ultrasound guidance: Trace effusion noted, 1 cc Kenalog 40, 2 cc lidocaine, 2 cc bupivacaine injected easily Completed without difficulty  Advised to call if fevers/chills, erythema, induration, drainage, or persistent bleeding.  Images permanently stored and available for review in PACS.  Impression: Technically successful ultrasound guided injection.  Independent interpretation of notes and tests performed by another provider:   None.  Brief History, Exam, Impression, and Recommendations:    Primary osteoarthritis of both knees Known bilateral knee osteoarthritis, we did Orthovisc last year, did well, bilateral steroid injections today. We will also get him approved for Orthovisc again.    ___________________________________________ Gwen Her. Dianah Field, M.D., ABFM., CAQSM. Primary Care and [place] Instructor of [place] [facility] of Medicine

## 2020-11-06 NOTE — Assessment & Plan Note (Signed)
Known bilateral knee osteoarthritis, we did Orthovisc last year, did well, bilateral steroid injections today. We will also get him approved for Orthovisc again.

## 2020-11-08 ENCOUNTER — Other Ambulatory Visit: Payer: Self-pay | Admitting: Osteopathic Medicine

## 2020-11-08 DIAGNOSIS — Z8673 Personal history of transient ischemic attack (TIA), and cerebral infarction without residual deficits: Secondary | ICD-10-CM

## 2020-11-08 NOTE — Telephone Encounter (Signed)
PA information submitted via [email or web address]/ Paperwork has been printed and given to Dr. Darene Lamer for signatures. Once obtained, information will be faxed to [employer] at [phone]

## 2020-11-10 ENCOUNTER — Other Ambulatory Visit: Payer: Self-pay | Admitting: Osteopathic Medicine

## 2020-11-11 ENCOUNTER — Other Ambulatory Visit: Payer: Self-pay | Admitting: Osteopathic Medicine

## 2020-11-11 DIAGNOSIS — E785 Hyperlipidemia, unspecified: Secondary | ICD-10-CM

## 2020-11-11 DIAGNOSIS — E1169 Type 2 diabetes mellitus with other specified complication: Secondary | ICD-10-CM

## 2020-11-11 DIAGNOSIS — Z8673 Personal history of transient ischemic attack (TIA), and cerebral infarction without residual deficits: Secondary | ICD-10-CM

## 2020-11-14 NOTE — Telephone Encounter (Signed)
[employer] paperwork signed by Dr. T and faxed to [employer] at [phone] Fax confirmation receipt received 

## 2020-11-16 ENCOUNTER — Ambulatory Visit: Payer: Medicare Other | Admitting: Osteopathic Medicine

## 2020-11-21 NOTE — Telephone Encounter (Signed)
Orthovisc Benefits Investigation Details received  Deductible applies. Since the deductible has been met, patient is responsible for coinsurance (20%). PA is not required.  The secondary plan does not follow Medicare guidelines. It will pick up the remaining eligible expenses at 20%. It does not cover the Medicare Part B deductible. The product is not covered under the pharmacy plan. If you would like assistance with sending the prescription to the specialty pharmacy [facility]) for fulfillment, contact [employer]  Spoke with Legrand Como at [facility] and he said that a PA is not required, that it all goes through medical, that we would need to do a buy and bill for this pt to get injections.

## 2020-11-21 NOTE — Telephone Encounter (Signed)
Lets get him scheduled for injections since it is buy and bill

## 2020-11-25 DIAGNOSIS — Z23 Encounter for immunization: Secondary | ICD-10-CM | POA: Diagnosis not present

## 2020-11-28 NOTE — Telephone Encounter (Signed)
Pt aware. Call transferred to the front desk for scheduling.

## 2020-12-04 ENCOUNTER — Ambulatory Visit (INDEPENDENT_AMBULATORY_CARE_PROVIDER_SITE_OTHER): Payer: Medicare Other | Admitting: Sports Medicine

## 2020-12-04 ENCOUNTER — Ambulatory Visit (INDEPENDENT_AMBULATORY_CARE_PROVIDER_SITE_OTHER): Payer: Medicare Other

## 2020-12-04 DIAGNOSIS — M170 Bilateral primary osteoarthritis of knee: Secondary | ICD-10-CM

## 2020-12-04 NOTE — Progress Notes (Signed)
    Procedures performed today:    Procedure: Real-time Ultrasound Guided injection of the left knee Device: Samsung HS60  Verbal informed consent obtained.  Time-out conducted.  Noted no overlying erythema, induration, or other signs of local infection.  Skin prepped in a sterile fashion.  Local anesthesia: Topical Ethyl chloride.  With sterile technique and under real time ultrasound guidance: No effusion noted, 30 mg/2 mL of OrthoVisc (sodium hyaluronate) in a prefilled syringe was injected easily into the knee through a 22-gauge needle. Completed without difficulty  Advised to call if fevers/chills, erythema, induration, drainage, or persistent bleeding.  Images permanently stored and available for review in PACS.  Impression: Technically successful ultrasound guided injection.  Procedure: Real-time Ultrasound Guided injection of the right knee Device: Samsung HS60  Verbal informed consent obtained.  Time-out conducted.  Noted no overlying erythema, induration, or other signs of local infection.  Skin prepped in a sterile fashion.  Local anesthesia: Topical Ethyl chloride.  With sterile technique and under real time ultrasound guidance: No effusion noted, 30 mg/2 mL of OrthoVisc (sodium hyaluronate) in a prefilled syringe was injected easily into the knee through a 22-gauge needle. Completed without difficulty  Advised to call if fevers/chills, erythema, induration, drainage, or persistent bleeding.  Images permanently stored and available for review in PACS.  Impression: Technically successful ultrasound guided injection.  Independent interpretation of notes and tests performed by another provider:   None.  Brief History, Exam, Impression, and Recommendations:    Primary osteoarthritis of both knees Steroid injections performed last month, today we did Orthovisc 1 of 4 into both knees, return in 1 week for Orthovisc No. 2 of 4 both  knees.    ___________________________________________ Gwen Her. Dianah Field, M.D., ABFM., CAQSM. Primary Care and [place] Instructor of [place] of [facility] of Medicine

## 2020-12-04 NOTE — Assessment & Plan Note (Signed)
Steroid injections performed last month, today we did Orthovisc 1 of 4 into both knees, return in 1 week for Orthovisc No. 2 of 4 both knees.

## 2020-12-10 ENCOUNTER — Other Ambulatory Visit: Payer: Self-pay | Admitting: Osteopathic Medicine

## 2020-12-10 DIAGNOSIS — R4586 Emotional lability: Secondary | ICD-10-CM

## 2020-12-11 ENCOUNTER — Ambulatory Visit (INDEPENDENT_AMBULATORY_CARE_PROVIDER_SITE_OTHER): Payer: Medicare Other | Admitting: Sports Medicine

## 2020-12-11 ENCOUNTER — Other Ambulatory Visit: Payer: Self-pay

## 2020-12-11 ENCOUNTER — Ambulatory Visit (INDEPENDENT_AMBULATORY_CARE_PROVIDER_SITE_OTHER): Payer: Medicare Other

## 2020-12-11 DIAGNOSIS — M170 Bilateral primary osteoarthritis of knee: Secondary | ICD-10-CM

## 2020-12-11 NOTE — Assessment & Plan Note (Signed)
Orthovisc 2 of 4 both knees, return in 1 week for #3 of 4. 

## 2020-12-11 NOTE — Progress Notes (Signed)
    Procedures performed today:    Procedure: Real-time Ultrasound Guided injection of the left knee Device: Samsung HS60  Verbal informed consent obtained.  Time-out conducted.  Noted no overlying erythema, induration, or other signs of local infection.  Skin prepped in a sterile fashion.  Local anesthesia: Topical Ethyl chloride.  With sterile technique and under real time ultrasound guidance: No effusion noted, 30 mg/2 mL of OrthoVisc (sodium hyaluronate) in a prefilled syringe was injected easily into the knee through a 22-gauge needle. Completed without difficulty  Advised to call if fevers/chills, erythema, induration, drainage, or persistent bleeding.  Images permanently stored and available for review in PACS.  Impression: Technically successful ultrasound guided injection.   Procedure: Real-time Ultrasound Guided injection of the right knee Device: Samsung HS60  Verbal informed consent obtained.  Time-out conducted.  Noted no overlying erythema, induration, or other signs of local infection.  Skin prepped in a sterile fashion.  Local anesthesia: Topical Ethyl chloride.  With sterile technique and under real time ultrasound guidance: No effusion noted, 30 mg/2 mL of OrthoVisc (sodium hyaluronate) in a prefilled syringe was injected easily into the knee through a 22-gauge needle. Completed without difficulty  Advised to call if fevers/chills, erythema, induration, drainage, or persistent bleeding.  Images permanently stored and available for review in PACS.  Impression: Technically successful ultrasound guided injection.  Independent interpretation of notes and tests performed by another provider:   None.  Brief History, Exam, Impression, and Recommendations:    Primary osteoarthritis of both knees Orthovisc 2 of 4 both knees, return in 1 week for #3 of 4.    ___________________________________________ Gwen Her. Dianah Field, M.D., ABFM., CAQSM. Primary Care and  [place] Instructor of [place] of [facility] of Medicine

## 2020-12-18 ENCOUNTER — Other Ambulatory Visit: Payer: Self-pay

## 2020-12-18 ENCOUNTER — Ambulatory Visit (INDEPENDENT_AMBULATORY_CARE_PROVIDER_SITE_OTHER): Payer: Medicare Other

## 2020-12-18 ENCOUNTER — Ambulatory Visit (INDEPENDENT_AMBULATORY_CARE_PROVIDER_SITE_OTHER): Payer: Medicare Other | Admitting: Sports Medicine

## 2020-12-18 DIAGNOSIS — M170 Bilateral primary osteoarthritis of knee: Secondary | ICD-10-CM

## 2020-12-18 NOTE — Progress Notes (Signed)
    Procedures performed today:    Procedure: Real-time Ultrasound Guided injection of the left knee Device: Samsung HS60  Verbal informed consent obtained.  Time-out conducted.  Noted no overlying erythema, induration, or other signs of local infection.  Skin prepped in a sterile fashion.  Local anesthesia: Topical Ethyl chloride.  With sterile technique and under real time ultrasound guidance: No effusion noted, 30 mg/2 mL of OrthoVisc (sodium hyaluronate) in a prefilled syringe was injected easily into the knee through a 22-gauge needle. Completed without difficulty  Advised to call if fevers/chills, erythema, induration, drainage, or persistent bleeding.  Images permanently stored and available for review in PACS.  Impression: Technically successful ultrasound guided injection.   Procedure: Real-time Ultrasound Guided injection of the right knee Device: Samsung HS60  Verbal informed consent obtained.  Time-out conducted.  Noted no overlying erythema, induration, or other signs of local infection.  Skin prepped in a sterile fashion.  Local anesthesia: Topical Ethyl chloride.  With sterile technique and under real time ultrasound guidance: No effusion noted, 30 mg/2 mL of OrthoVisc (sodium hyaluronate) in a prefilled syringe was injected easily into the knee through a 22-gauge needle. Completed without difficulty  Advised to call if fevers/chills, erythema, induration, drainage, or persistent bleeding.  Images permanently stored and available for review in PACS.  Impression: Technically successful ultrasound guided injection.  Independent interpretation of notes and tests performed by another provider:   None.  Brief History, Exam, Impression, and Recommendations:    Primary osteoarthritis of both knees Orthovisc 3 of 4 both knees, return in 1 week for Orthovisc No. 4 of 4 both knees.    ___________________________________________ Gwen Her. Dianah Field, M.D., ABFM.,  CAQSM. Primary Care and [place] Instructor of [place] of [facility] of Medicine

## 2020-12-18 NOTE — Assessment & Plan Note (Signed)
Orthovisc 3 of 4 both knees, return in 1 week for Orthovisc No. 4 of 4 both knees 

## 2020-12-25 ENCOUNTER — Other Ambulatory Visit: Payer: Self-pay

## 2020-12-25 ENCOUNTER — Ambulatory Visit (INDEPENDENT_AMBULATORY_CARE_PROVIDER_SITE_OTHER): Payer: Medicare Other | Admitting: Sports Medicine

## 2020-12-25 ENCOUNTER — Ambulatory Visit (INDEPENDENT_AMBULATORY_CARE_PROVIDER_SITE_OTHER): Payer: Medicare Other

## 2020-12-25 DIAGNOSIS — M170 Bilateral primary osteoarthritis of knee: Secondary | ICD-10-CM | POA: Diagnosis not present

## 2020-12-25 NOTE — Progress Notes (Signed)
    Procedures performed today:    Procedure: Real-time Ultrasound Guided injection of the right knee Device: Samsung HS60  Verbal informed consent obtained.  Time-out conducted.  Noted no overlying erythema, induration, or other signs of local infection.  Skin prepped in a sterile fashion.  Local anesthesia: Topical Ethyl chloride.  With sterile technique and under real time ultrasound guidance: No effusion noted, 30 mg/2 mL of OrthoVisc (sodium hyaluronate) in a prefilled syringe was injected easily into the knee through a 22-gauge needle. Completed without difficulty  Advised to call if fevers/chills, erythema, induration, drainage, or persistent bleeding.  Images permanently stored and available for review in PACS.  Impression: Technically successful ultrasound guided injection.  Procedure: Real-time Ultrasound Guided injection of the left knee Device: Samsung HS60  Verbal informed consent obtained.  Time-out conducted.  Noted no overlying erythema, induration, or other signs of local infection.  Skin prepped in a sterile fashion.  Local anesthesia: Topical Ethyl chloride.  With sterile technique and under real time ultrasound guidance: No effusion noted, 30 mg/2 mL of OrthoVisc (sodium hyaluronate) in a prefilled syringe was injected easily into the knee through a 22-gauge needle. Completed without difficulty  Advised to call if fevers/chills, erythema, induration, drainage, or persistent bleeding.  Images permanently stored and available for review in PACS.  Impression: Technically successful ultrasound guided injection.  Independent interpretation of notes and tests performed by another provider:   None.  Brief History, Exam, Impression, and Recommendations:    Primary osteoarthritis of both knees Bilateral Orthovisc 4 of 4, return as needed.    ___________________________________________ Gwen Her. Dianah Field, M.D., ABFM., CAQSM. Primary Care and [place] Instructor of [place] [employer] [facility] [employer]

## 2020-12-25 NOTE — Assessment & Plan Note (Signed)
Bilateral Orthovisc 4 of 4, return as needed.

## 2020-12-28 ENCOUNTER — Other Ambulatory Visit: Payer: Self-pay | Admitting: Osteopathic Medicine

## 2020-12-28 DIAGNOSIS — E1159 Type 2 diabetes mellitus with other circulatory complications: Secondary | ICD-10-CM

## 2020-12-28 DIAGNOSIS — I152 Hypertension secondary to endocrine disorders: Secondary | ICD-10-CM

## 2021-01-08 ENCOUNTER — Other Ambulatory Visit: Payer: Self-pay | Admitting: Sports Medicine

## 2021-01-08 DIAGNOSIS — M9905 Segmental and somatic dysfunction of pelvic region: Secondary | ICD-10-CM | POA: Diagnosis not present

## 2021-01-08 DIAGNOSIS — M5030 Other cervical disc degeneration, unspecified cervical region: Secondary | ICD-10-CM | POA: Diagnosis not present

## 2021-01-08 DIAGNOSIS — M5136 Other intervertebral disc degeneration, lumbar region: Secondary | ICD-10-CM | POA: Diagnosis not present

## 2021-01-08 DIAGNOSIS — M542 Cervicalgia: Secondary | ICD-10-CM | POA: Diagnosis not present

## 2021-01-08 DIAGNOSIS — M546 Pain in thoracic spine: Secondary | ICD-10-CM | POA: Diagnosis not present

## 2021-01-08 DIAGNOSIS — M9903 Segmental and somatic dysfunction of lumbar region: Secondary | ICD-10-CM | POA: Diagnosis not present

## 2021-01-08 DIAGNOSIS — M9902 Segmental and somatic dysfunction of thoracic region: Secondary | ICD-10-CM | POA: Diagnosis not present

## 2021-01-08 DIAGNOSIS — M5451 Vertebrogenic low back pain: Secondary | ICD-10-CM | POA: Diagnosis not present

## 2021-01-08 DIAGNOSIS — M9901 Segmental and somatic dysfunction of cervical region: Secondary | ICD-10-CM | POA: Diagnosis not present

## 2021-01-08 MED ORDER — TRAMADOL HCL 50 MG PO TABS: 50.0000 mg | ORAL_TABLET | Freq: Three times a day (TID) | ORAL | 0 refills | Status: DC | PRN

## 2021-01-15 DIAGNOSIS — Z20822 Contact with and (suspected) exposure to covid-19: Secondary | ICD-10-CM | POA: Diagnosis not present

## 2021-01-19 ENCOUNTER — Other Ambulatory Visit: Payer: Self-pay | Admitting: Osteopathic Medicine

## 2021-01-19 DIAGNOSIS — R4586 Emotional lability: Secondary | ICD-10-CM

## 2021-01-22 ENCOUNTER — Other Ambulatory Visit: Payer: Self-pay | Admitting: Osteopathic Medicine

## 2021-01-22 ENCOUNTER — Other Ambulatory Visit: Payer: Self-pay

## 2021-01-22 DIAGNOSIS — R4586 Emotional lability: Secondary | ICD-10-CM

## 2021-01-22 MED ORDER — ESCITALOPRAM OXALATE 5 MG/5ML PO SOLN: 20.0000 mg | Freq: Every day | ORAL | 1 refills | Status: DC

## 2021-02-09 ENCOUNTER — Emergency Department (INDEPENDENT_AMBULATORY_CARE_PROVIDER_SITE_OTHER): Payer: Medicare Other

## 2021-02-09 ENCOUNTER — Emergency Department (INDEPENDENT_AMBULATORY_CARE_PROVIDER_SITE_OTHER)
Admission: EM | Admit: 2021-02-09 | Discharge: 2021-02-09 | Disposition: A | Discharge status: Home / Self Care | Payer: Medicare Other | Source: Home / Self Care

## 2021-02-09 ENCOUNTER — Other Ambulatory Visit: Payer: Self-pay

## 2021-02-09 DIAGNOSIS — U071 COVID-19: Secondary | ICD-10-CM | POA: Diagnosis not present

## 2021-02-09 DIAGNOSIS — R059 Cough, unspecified: Secondary | ICD-10-CM | POA: Diagnosis not present

## 2021-02-09 DIAGNOSIS — R110 Nausea: Secondary | ICD-10-CM

## 2021-02-09 DIAGNOSIS — R0602 Shortness of breath: Secondary | ICD-10-CM | POA: Diagnosis not present

## 2021-02-09 MED ORDER — MOLNUPIRAVIR EUA 200MG CAPSULE: 4.0000 | ORAL_CAPSULE | Freq: Two times a day (BID) | ORAL | 0 refills | Status: AC

## 2021-02-09 MED ORDER — ONDANSETRON 8 MG PO TBDP: 8.0000 mg | ORAL_TABLET | Freq: Three times a day (TID) | ORAL | 0 refills | Status: DC | PRN

## 2021-02-09 NOTE — ED Triage Notes (Signed)
Pt states that he has some sob, vomiting, and coughx2 days  Pt states that he is vaccinated for coivd. Pt states that he has had flu vaccine.   Pt states that he tested positive for covid 12/30.

## 2021-02-09 NOTE — ED Provider Notes (Signed)
[facility]    CSN: [id number] Arrival date & time: 02/09/21  1037      History   Chief Complaint Chief Complaint  Patient presents with   Shortness of Breath    Sob, vomiting, cough. X2 days    HPI Damon Acosta is a 80 y.o. male.   HPI 80-year-old male presents with positive COVID-19 yesterday, 02/08/2021.  Reports accompanying shortness of breath, nausea, vomiting, and cough x2 days.  PMH significant for stroke, history of MI, paroxysmal atrial fibrillation, atrial flutter, and CKD stage III.  Patient is currently on apixaban and denies any unusual bleeding.  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   Overactive bladder    Sleep apnea    Stroke [facility])     Patient Active Problem List   Diagnosis Date Noted   History of MI (myocardial infarction) 10/21/2019   Diversion colitis 10/07/2019   Gastroesophageal reflux disease without esophagitis 10/07/2019   Paroxysmal atrial fibrillation ([place]) 09/16/2019   Atrial flutter ([place]) 09/15/2019   NSTEMI (non-ST elevated myocardial infarction) ([place]) 09/15/2019   Recurrent major depressive disorder, in partial remission ([place]) 09/15/2019   Asymptomatic PVCs 07/27/2018   CKD (chronic kidney disease) stage 3, GFR 30-59 ml/min ([name]) 04/27/2018   Mild cognitive impairment 04/20/2018   Microcytic anemia 04/20/2018   Primary osteoarthritis of both knees 02/17/2018   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   Type 2 diabetes mellitus with chronic kidney disease, without long-term current use of insulin ([place]) 01/06/2018   Dyslipidemia associated with type 2 diabetes mellitus ([place]) 01/06/2018   OAB (overactive bladder) 01/06/2018   History of prostate cancer 01/06/2018   Lumbar spinal stenosis 12/15/2017   Hypertension  associated with diabetes ([place]) 12/15/2017   Gout 12/15/2017   History of CVA (cerebrovascular accident) 12/15/2017   Ileostomy in place [facility]) 12/15/2017    Past Surgical History:  Procedure Laterality Date   CHOLECYSTECTOMY     COLOSTOMY     PROSTATE SURGERY         Home Medications    Prior to Admission medications   Medication Sig Start Date End Date Taking? Authorizing Provider  acetaminophen (TYLENOL) 650 MG CR tablet Take 1-2 tablets (650-1,300 mg total) by mouth every 8 (eight) hours as needed for pain. 03/10/19  Yes Emeterio Reeve, DO  allopurinol (ZYLOPRIM) 100 MG tablet Take 1 tablet (100 mg total) by mouth daily. 05/21/20  Yes Emeterio Reeve, DO  AMBULATORY NON FORMULARY MEDICATION MOTORIZED SCOOTER, DX AMBULATORY DYSFUNCTION, LOWER EXTREMITY WEAKNESS ON LEFT, MULTIPLE SCLEROSIS 09/04/20  Yes Emeterio Reeve, DO  AMBULATORY NON FORMULARY MEDICATION Motorized wheelchair/scooter per patient preference and insurance coverage. Please fax to any medical supply company local to [place]   Dx: (E11.22) Type 2 diabetes mellitus with chronic kidney disease, without long-term current use of insulin, unspecified CKD stage ([place])  (primary encounter diagnosis) (E11.59,  I15.2) Hypertension associated with diabetes ([place]) (I48.0) Paroxysmal atrial fibrillation ([name]) (R26.2) Ambulatory dysfunction (R26.81) Gait instability (M48.062) Spinal stenosis of lumbar region with neurogenic claudication (Z86.73) History of CVA (cerebrovascular accident) (Z93.2) Ileostomy in place ([place]) 09/07/20  Yes Emeterio Reeve, DO  apixaban (ELIQUIS) 2.5 MG TABS tablet Take 1 tablet (2.5 mg total) by mouth 2 (two) times daily. 05/17/20  Yes Emeterio Reeve,  DO  atorvastatin (LIPITOR) 20 MG tablet TAKE 1 TABLET BY MOUTH EVERY DAY 11/12/20  Yes Breeback, Jade L, PA-C  clopidogrel (PLAVIX) 75 MG tablet TAKE 1 TABLET BY MOUTH EVERY DAY 11/08/20  Yes Silverio Decamp, MD  escitalopram  (LEXAPRO) 5 MG/5ML solution Take 20 mLs (20 mg total) by mouth daily. 01/22/21  Yes Terrilyn Saver, NP  fluticasone (FLONASE) 50 MCG/ACT nasal spray One spray in each nostril twice a day, use left hand for right nostril, and right hand for left nostril. 01/18/20  Yes Emeterio Reeve, DO  irbesartan (AVAPRO) 75 MG tablet TAKE 1 TABLET BY MOUTH EVERY DAY 12/28/20  Yes Breeback, Jade L, PA-C  linagliptin (TRADJENTA) 5 MG TABS tablet Take 1 tablet (5 mg total) by mouth daily. 05/17/20  Yes Emeterio Reeve, DO  methocarbamol (ROBAXIN) 500 MG tablet Take 1 tablet (500 mg total) by mouth 2 (two) times daily as needed for muscle spasms. 04/09/20  Yes Emeterio Reeve, DO  metoprolol succinate (TOPROL-XL) 50 MG 24 hr tablet Take 1 tablet (50 mg total) by mouth daily. 05/17/20  Yes Emeterio Reeve, DO  nitroGLYCERIN (NITROSTAT) 0.6 MG SL tablet Place 0.6 mg under the tongue every 5 (five) minutes as needed for chest pain.   Yes [provider]  Nystatin POWD Apply liberally to affected area 2 times per day 04/03/20  Yes Emeterio Reeve, DO  oxybutynin (DITROPAN-XL) 10 MG 24 hr tablet TAKE 1 TABLET BY MOUTH EVERYDAY AT BEDTIME 11/12/20  Yes Breeback, Jade L, PA-C  traMADol (ULTRAM) 50 MG tablet Take 1 tablet (50 mg total) by mouth every 8 (eight) hours as needed for severe pain. 01/08/21 04/08/21 Yes Silverio Decamp, MD  molnupiravir EUA (LAGEVRIO) 200 mg CAPS capsule Take 4 capsules (800 mg total) by mouth 2 (two) times daily for 5 days. 02/09/21 02/14/21 Yes Eliezer Lofts, FNP  ondansetron (ZOFRAN-ODT) 8 MG disintegrating tablet Take 1 tablet (8 mg total) by mouth every 8 (eight) hours as needed for nausea or vomiting. 02/09/21  Yes Eliezer Lofts, FNP    Family History Family History  Problem Relation Age of Onset   Diabetes Sister     Social History Social History   Tobacco Use   Smoking status: Former   Smokeless tobacco: Never  Substance Use Topics   Alcohol use: Not  Currently   Drug use: Never     Allergies   Aspirin, Doxepin, Shellfish allergy, and Sertraline   Review of Systems Review of Systems  Respiratory:  Positive for cough and shortness of breath.   Gastrointestinal:  Positive for nausea and vomiting.  All other systems reviewed and are negative.   Physical Exam Triage Vital Signs ED Triage Vitals  Enc Vitals Group     BP 02/09/21 1152 118/72     Pulse Rate 02/09/21 1152 84     Resp 02/09/21 1152 18     Temp 02/09/21 1152 98.2 F (36.8 C)     Temp Source 02/09/21 1152 Oral     SpO2 02/09/21 1152 97 %     Weight 02/09/21 1150 183 lb (83 kg)     Height 02/09/21 1150 5\' 9"  (1.753 m)     Head Circumference --      Peak Flow --      Pain Score 02/09/21 1149 4     Pain Loc --      Pain Edu? --      Excl. in [place]? --    No data found.  Updated Vital Signs BP 118/72 (BP Location: Left Arm)    Pulse 84    Temp 98.2 F (36.8 C) (Oral)    Resp 18    Ht 5\' 9"  (1.753 m)    Wt 183 lb (83 kg)    SpO2 97%    BMI 27.02 kg/m       Physical Exam Vitals and nursing note reviewed.  Constitutional:      General: He is not in acute distress.    Appearance: He is well-developed and normal weight. He is not ill-appearing.  HENT:     Mouth/Throat:     Mouth: Mucous membranes are moist.     Pharynx: Oropharynx is clear.  Eyes:     Extraocular Movements: Extraocular movements intact.     Pupils: Pupils are equal, round, and reactive to light.  Cardiovascular:     Rate and Rhythm: Normal rate and regular rhythm.     Pulses: Normal pulses.     Heart sounds: Normal heart sounds. No murmur heard.   No friction rub. No gallop.  Pulmonary:     Effort: Pulmonary effort is normal.     Breath sounds: Normal breath sounds.  Musculoskeletal:     Cervical back: Normal range of motion and neck supple.  Skin:    General: Skin is warm and dry.  Neurological:     Mental Status: He is alert and oriented to person, place, and time.     UC  Treatments / Results  Labs (all labs ordered are listed, but only abnormal results are displayed) Labs Reviewed - No data to display  EKG   Radiology DG Chest 2 View  Result Date: 02/09/2021 CLINICAL DATA:  Shortness of breath, cough. EXAM: CHEST - 2 VIEW COMPARISON:  None. FINDINGS: The heart size and mediastinal contours are within normal limits. Both lungs are clear. The visualized skeletal structures are unremarkable. IMPRESSION: No active cardiopulmonary disease. Electronically Signed   By: Marijo Conception M.D.   On: 02/09/2021 12:51    Procedures Procedures (including critical care time)  Medications Ordered in UC Medications - No data to display  Initial Impression / Assessment and Plan / UC Course  I have reviewed the triage vital signs and the nursing notes.  Pertinent labs & imaging results that were available during my care of the patient were reviewed by me and considered in my medical decision making (see chart for details).     MDM: 1.  COVID-19- Rx'd Molnupiravir; 2. Nausea-Rx'd Zofran. Advised patient to take medication as directed with food to completion.  Advised patient may use Zofran daily or as needed for nausea.  Advised patient to remain self quarantine for the next 10 days or until Monday, 02/18/2021.  Encouraged patient to increase daily water/fluid intake while taking these medications.  Charged home, hemodynamically stable. Final Clinical Impressions(s) / UC Diagnoses   Final diagnoses:  COVID-19  Nausea     Discharge Instructions      Advised patient to take medication as directed with food to completion.  Advised patient may use Zofran daily or as needed for nausea.  Advised patient to remain self quarantine for the next 10 days or until Monday, 02/18/2021.  Encouraged patient to increase daily water/fluid intake while taking these medications.     ED Prescriptions     Medication Sig Dispense Auth. Provider   molnupiravir EUA (LAGEVRIO) 200 mg  CAPS capsule Take 4 capsules (800 mg total) by mouth 2 (two) times daily  for 5 days. 40 capsule Eliezer Lofts, FNP   ondansetron (ZOFRAN-ODT) 8 MG disintegrating tablet Take 1 tablet (8 mg total) by mouth every 8 (eight) hours as needed for nausea or vomiting. 24 tablet Eliezer Lofts, FNP      PDMP not reviewed this encounter.   Eliezer Lofts, Conce[place] 02/09/21 1322

## 2021-02-09 NOTE — Discharge Instructions (Addendum)
Advised patient to take medication as directed with food to completion.  Advised patient may use Zofran daily or as needed for nausea.  Advised patient to remain self quarantine for the next 10 days or until Monday, 02/18/2021.  Encouraged patient to increase daily water/fluid intake while taking these medications.

## 2021-02-09 NOTE — ED Triage Notes (Signed)
Pt states that he also has some body aches.

## 2021-02-24 ENCOUNTER — Other Ambulatory Visit: Payer: Self-pay | Admitting: Physician Assistant

## 2021-02-24 ENCOUNTER — Other Ambulatory Visit: Payer: Self-pay | Admitting: Osteopathic Medicine

## 2021-02-24 DIAGNOSIS — E1159 Type 2 diabetes mellitus with other circulatory complications: Secondary | ICD-10-CM

## 2021-02-24 DIAGNOSIS — R4586 Emotional lability: Secondary | ICD-10-CM

## 2021-02-27 DIAGNOSIS — Z961 Presence of intraocular lens: Secondary | ICD-10-CM | POA: Diagnosis not present

## 2021-02-27 DIAGNOSIS — H527 Unspecified disorder of refraction: Secondary | ICD-10-CM | POA: Diagnosis not present

## 2021-02-27 DIAGNOSIS — H02834 Dermatochalasis of left upper eyelid: Secondary | ICD-10-CM | POA: Diagnosis not present

## 2021-02-27 DIAGNOSIS — E119 Type 2 diabetes mellitus without complications: Secondary | ICD-10-CM | POA: Diagnosis not present

## 2021-02-27 DIAGNOSIS — H26493 Other secondary cataract, bilateral: Secondary | ICD-10-CM | POA: Diagnosis not present

## 2021-02-27 DIAGNOSIS — H02831 Dermatochalasis of right upper eyelid: Secondary | ICD-10-CM | POA: Diagnosis not present

## 2021-02-27 LAB — HM DIABETES EYE EXAM

## 2021-03-07 ENCOUNTER — Ambulatory Visit (INDEPENDENT_AMBULATORY_CARE_PROVIDER_SITE_OTHER): Payer: Medicare Other | Admitting: Family Medicine

## 2021-03-07 ENCOUNTER — Other Ambulatory Visit: Payer: Self-pay

## 2021-03-07 ENCOUNTER — Encounter: Payer: Self-pay | Admitting: Family Medicine

## 2021-03-07 VITALS — BP 105/63 | HR 95 | Ht 69.0 in | Wt 186.0 lb

## 2021-03-07 DIAGNOSIS — E785 Hyperlipidemia, unspecified: Secondary | ICD-10-CM

## 2021-03-07 DIAGNOSIS — E1169 Type 2 diabetes mellitus with other specified complication: Secondary | ICD-10-CM

## 2021-03-07 DIAGNOSIS — E1122 Type 2 diabetes mellitus with diabetic chronic kidney disease: Secondary | ICD-10-CM

## 2021-03-07 DIAGNOSIS — I152 Hypertension secondary to endocrine disorders: Secondary | ICD-10-CM

## 2021-03-07 DIAGNOSIS — Z8673 Personal history of transient ischemic attack (TIA), and cerebral infarction without residual deficits: Secondary | ICD-10-CM | POA: Diagnosis not present

## 2021-03-07 DIAGNOSIS — F3341 Major depressive disorder, recurrent, in partial remission: Secondary | ICD-10-CM | POA: Diagnosis not present

## 2021-03-07 DIAGNOSIS — I480 Paroxysmal atrial fibrillation: Secondary | ICD-10-CM | POA: Diagnosis not present

## 2021-03-07 DIAGNOSIS — Z23 Encounter for immunization: Secondary | ICD-10-CM

## 2021-03-07 DIAGNOSIS — M48062 Spinal stenosis, lumbar region with neurogenic claudication: Secondary | ICD-10-CM | POA: Diagnosis not present

## 2021-03-07 DIAGNOSIS — E1159 Type 2 diabetes mellitus with other circulatory complications: Secondary | ICD-10-CM

## 2021-03-07 LAB — POCT GLYCOSYLATED HEMOGLOBIN (HGB A1C): Hemoglobin A1C: 5.5 % (ref 4.0–5.6)

## 2021-03-07 MED ORDER — PREDNISONE 50 MG PO TABS: ORAL_TABLET | ORAL | 0 refills | Status: DC

## 2021-03-07 MED ORDER — ESCITALOPRAM OXALATE 5 MG/5ML PO SOLN: 20.0000 mg | Freq: Every day | ORAL | 1 refills | Status: DC

## 2021-03-07 MED ORDER — ESCITALOPRAM OXALATE 20 MG PO TABS: 20.0000 mg | ORAL_TABLET | Freq: Every day | ORAL | 2 refills | Status: DC

## 2021-03-07 MED ORDER — APIXABAN 2.5 MG PO TABS: 2.5000 mg | ORAL_TABLET | Freq: Two times a day (BID) | ORAL | 3 refills | Status: DC

## 2021-03-07 MED ORDER — METOPROLOL SUCCINATE ER 50 MG PO TB24: 50.0000 mg | ORAL_TABLET | Freq: Every day | ORAL | 3 refills | Status: DC

## 2021-03-07 MED ORDER — TRAMADOL HCL 50 MG PO TABS: 50.0000 mg | ORAL_TABLET | Freq: Three times a day (TID) | ORAL | 0 refills | Status: DC | PRN

## 2021-03-07 NOTE — Patient Instructions (Addendum)
Nice to meet you today! Try prednisone for the neck and shoulder.  See me again in about 6 months.

## 2021-03-07 NOTE — Assessment & Plan Note (Addendum)
Diabetes remains very well controlled at this time. Lab Results  Component Value Date   HGBA1C 5.5 03/07/2021  Continue Tradjenta at current strength. Continue atorvastatin at current strength for associated hyperlipidemia.

## 2021-03-07 NOTE — Assessment & Plan Note (Signed)
Rate controlled with metoprolol and anticoagulated with Eliquis.  Doing well with both of these.  He will continue management per cardiology.

## 2021-03-07 NOTE — Assessment & Plan Note (Signed)
Stable with Lexapro at current strength.  We will continue.

## 2021-03-07 NOTE — Progress Notes (Signed)
Damon Acosta - 81 y.o. male MRN [id number]  Date of birth: [birth year]  Subjective Chief Complaint  Patient presents with   Transitions Of Care    HPI Damon Acosta is a 47-year-old male here today for follow-up visit.  He is transferring care from Dr. Sheppard Coil.  He has history of hypertension, paroxysmal atrial fibrillation, type 2 diabetes with hyperlipidemia, chronic pain related to lumbar stenosis and depression.  He is having some pain in the left side of his neck with radiation into his left shoulder.  Denies increased weakness of the arm.  Pain has been present for approximately 2 weeks.  Blood pressures been well managed with irbesartan and metoprolol.  Tolerating well without side effects.  He is followed by cardiology for management of his atrial flutter which is rate controlled at this time.  He is anticoagulated with Eliquis.  Blood sugars have been very well controlled with linagliptin.  Denies symptoms of hypoglycemia with this.  He has been trying to follow a lower carbohydrate diet.  He is on chronic tramadol as needed for management of pain related to lumbar stenosis.  This is working well for him and he is not experiencing any side effects from this.  Depression remains well controlled with Lexapro at current strength.  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   Overactive bladder    Sleep apnea    Stroke [facility])     Past Surgical History:  Procedure  Laterality Date   CHOLECYSTECTOMY     COLOSTOMY     PROSTATE SURGERY      Social History   Socioeconomic History   Marital status: Married    Spouse name: Not on file   Number of children: Not on file   Years of education: Not on file   Highest education level: Not on file  Occupational History   Not on file  Tobacco Use   Smoking status: Former   Smokeless tobacco: Never  Substance and Sexual Activity   Alcohol use: Not Currently   Drug use: Never   Sexual activity: Not Currently  Other Topics Concern   Not on file  Social History Narrative   Not on file   Social Determinants of Health   Financial Resource Strain: Not on file  Food Insecurity: Not on file  Transportation Needs: Not on file  Physical Activity: Not on file  Stress: Not on file  Social Connections: Not on file    Family History  Problem Relation Age of Onset   Diabetes Sister     Health Maintenance  Topic Date Due   Zoster Vaccines- Shingrix (1 of 2) Never done   OPHTHALMOLOGY EXAM  02/10/2019   COVID-19 Vaccine (3 - Moderna risk series) 05/21/2019   INFLUENZA VACCINE  09/10/2020   TETANUS/TDAP  04/03/2021 (Originally 02/19/1959)   HEMOGLOBIN A1C  09/04/2021   FOOT EXAM  03/07/2022   Pneumonia  Vaccine 79+ Years old  Completed   HPV VACCINES  Aged Out     ----------------------------------------------------------------------------------------------------------------------------------------------------------------------------------------------------------------- Physical Exam BP 105/63 (BP Location: Left Arm, Patient Position: Sitting, Cuff Size: Normal)    Pulse 95    Ht 5\' 9"  (1.753 m)    Wt 186 lb (84.4 kg)    SpO2 94%    BMI 27.47 kg/m   Physical Exam Constitutional:      Comments: Uses walker for ambulation.  Eyes:     General: No scleral icterus. Cardiovascular:     Rate and Rhythm: Normal rate and regular rhythm.  Pulmonary:     Effort: Pulmonary effort is normal.     Breath  sounds: Normal breath sounds.  Musculoskeletal:     Cervical back: Neck supple.  Neurological:     General: No focal deficit present.     Mental Status: He is alert.  Psychiatric:        Mood and Affect: Mood normal.        Behavior: Behavior normal.    ------------------------------------------------------------------------------------------------------------------------------------------------------------------------------------------------------------------- Assessment and Plan  Paroxysmal atrial fibrillation ([name]) Rate controlled with metoprolol and anticoagulated with Eliquis.  Doing well with both of these.  He will continue management per cardiology.  Hypertension associated with diabetes ([place]) Blood pressure is well controlled at this time.  He will continue irbesartan and metoprolol at current strength.  Dyslipidemia associated with type 2 diabetes mellitus ([place]) Diabetes remains very well controlled at this time. Lab Results  Component Value Date   HGBA1C 5.5 03/07/2021  Continue Tradjenta at current strength. Continue atorvastatin at current strength for associated hyperlipidemia.   Recurrent major depressive disorder, in partial remission ([name]) Stable with Lexapro at current strength.  We will continue.  Lumbar spinal stenosis Chronic pain related to lumbar stenosis is well controlled with tramadol as needed.  We will plan to continue this at current strength.   Meds ordered this encounter  Medications   apixaban (ELIQUIS) 2.5 MG TABS tablet    Sig: Take 1 tablet (2.5 mg total) by mouth 2 (two) times daily.    Dispense:  180 tablet    Refill:  3   traMADol (ULTRAM) 50 MG tablet    Sig: Take 1 tablet (50 mg total) by mouth every 8 (eight) hours as needed for severe pain.    Dispense:  90 tablet    Refill:  0   metoprolol succinate (TOPROL-XL) 50 MG 24 hr tablet    Sig: Take 1 tablet (50 mg total) by mouth daily.    Dispense:  90 tablet    Refill:  3    DISCONTD: escitalopram (LEXAPRO) 5 MG/5ML solution    Sig: Take 20 mLs (20 mg total) by mouth daily.    Dispense:  1800 mL    Refill:  1   predniSONE (DELTASONE) 50 MG tablet    Sig: Take 1 tab PO daily x5 days.    Dispense:  5 tablet    Refill:  0   escitalopram (LEXAPRO) 20 MG tablet    Sig: Take 1 tablet (20 mg total) by mouth daily.    Dispense:  90 tablet    Refill:  2    Please fill tabs instead of solution.  Thanks!    Return in about 6 months (around 09/04/2021) for HTN/T2DM.    This visit occurred during the SARS-CoV-2 public health emergency.  Safety protocols were in place, including screening questions prior to the visit, additional usage of staff PPE, and extensive  cleaning of exam room while observing appropriate contact time as indicated for disinfecting solutions.  ° °

## 2021-03-07 NOTE — Assessment & Plan Note (Signed)
Chronic pain related to lumbar stenosis is well controlled with tramadol as needed.  We will plan to continue this at current strength.

## 2021-03-07 NOTE — Assessment & Plan Note (Signed)
Blood pressure is well controlled at this time.  He will continue irbesartan and metoprolol at current strength.

## 2021-03-15 ENCOUNTER — Other Ambulatory Visit: Payer: Self-pay | Admitting: Osteopathic Medicine

## 2021-03-15 ENCOUNTER — Other Ambulatory Visit: Payer: Self-pay

## 2021-03-15 ENCOUNTER — Emergency Department (INDEPENDENT_AMBULATORY_CARE_PROVIDER_SITE_OTHER)
Admission: EM | Admit: 2021-03-15 | Discharge: 2021-03-15 | Disposition: A | Discharge status: Home / Self Care | Payer: Medicare Other | Source: Home / Self Care | Attending: Family Medicine | Admitting: Family Medicine

## 2021-03-15 DIAGNOSIS — Z8739 Personal history of other diseases of the musculoskeletal system and connective tissue: Secondary | ICD-10-CM

## 2021-03-15 DIAGNOSIS — U071 COVID-19: Secondary | ICD-10-CM

## 2021-03-15 MED ORDER — MOLNUPIRAVIR EUA 200MG CAPSULE: 4.0000 | ORAL_CAPSULE | Freq: Two times a day (BID) | ORAL | 0 refills | Status: AC

## 2021-03-15 NOTE — Discharge Instructions (Addendum)
Take plain guaifenesin (600mg  or 1200mg  extended release tabs such as Mucinex) twice daily, with plenty of water, for cough and congestion.  Get adequate rest.   Try warm salt water gargles for sore throat.  May take Delsym Cough Suppressant ("12 Hour Cough Relief") at bedtime for nighttime cough.  Stop all antihistamines for now, and other non-prescription cough/cold preparations. Recommend obtaining a pulse oximeter (oxygen meter) and check your oxygen regularly.  Go to an emergency room if oxygen begins decreasing and you develop increasing shortness of breath.   Your COVID-19 test is positive.  Isolate yourself for five days from the time of your symptom onset.  At the end of five days you may end isolation if your symptoms have cleared or improved, and you have not had a fever for 24 hours. At this time you should wear a mask for five more days when you are around others.

## 2021-03-15 NOTE — ED Triage Notes (Signed)
Pt states that he has some vomiting, sob, nausea and fatigue. X1 month   Pt states that he tested positive for covid in December.  Pt states that he took at home covid test 2/3 which was positive.   Pt states that he is vaccinated for covid. Pt states that he had flu vaccine.

## 2021-03-15 NOTE — ED Provider Notes (Signed)
Damon Acosta CARE    CSN: [id number] Arrival date & time: 03/15/21  1102      History   Chief Complaint Chief Complaint  Patient presents with   Vomiting    Vomiting, sob, nausea, and fatigue. X1 month     HPI Damon Acosta is a 81 y.o. male.   Patient states that he developed a productive cough yesterday, and nausea with an episode of vomiting last night.  He denies chest pain and shortness of breath.  This morning he had a positive home COVID19 test.  He had COVID19 infection in December, with consequent fatigue and intermittent shortness of breath for a month.  He is vaccinated for both flu and COVID19.  The history is provided by the patient.   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   Type 2 diabetes mellitus with chronic kidney disease, without long-term current use of insulin ([place]) 01/06/2018   Dyslipidemia  associated with type 2 diabetes mellitus ([place]) 01/06/2018   OAB (overactive bladder) 01/06/2018   History of prostate cancer 01/06/2018   Lumbar spinal stenosis 12/15/2017   Hypertension associated with diabetes ([place]) 12/15/2017   Gout 12/15/2017   History of CVA (cerebrovascular accident) 12/15/2017   Ileostomy in place [facility]) 12/15/2017    Past Surgical History:  Procedure Laterality Date   CHOLECYSTECTOMY     COLOSTOMY     PROSTATE SURGERY         Home Medications    Prior to Admission medications   Medication Sig Start Date End Date Taking? Authorizing Provider  acetaminophen (TYLENOL) 650 MG CR tablet Take 1-2 tablets (650-1,300 mg total) by mouth every 8 (eight) hours as needed for pain. 03/10/19  Yes Emeterio Reeve, DO  allopurinol (ZYLOPRIM) 100 MG tablet Take 1 tablet (100 mg total) by mouth daily. 05/21/20  Yes Emeterio Reeve, DO  AMBULATORY NON FORMULARY MEDICATION MOTORIZED SCOOTER, DX AMBULATORY DYSFUNCTION, LOWER EXTREMITY WEAKNESS ON LEFT, MULTIPLE SCLEROSIS 09/04/20  Yes Emeterio Reeve, DO  AMBULATORY NON FORMULARY MEDICATION Motorized wheelchair/scooter per patient preference and insurance coverage. Please fax to any medical supply company local to [place]   Dx: (E11.22) Type 2 diabetes mellitus with chronic kidney disease, without long-term current use of insulin, unspecified CKD stage ([place])  (primary encounter diagnosis) (E11.59,  I15.2) Hypertension associated with diabetes ([place]) (I48.0) Paroxysmal atrial fibrillation ([name]) (R26.2) Ambulatory dysfunction (R26.81) Gait instability (M48.062) Spinal stenosis of lumbar region with neurogenic  claudication (Z86.73) History of CVA (cerebrovascular accident) (Z93.2) Ileostomy in place ([place]) 09/07/20  Yes Emeterio Reeve, DO  apixaban (ELIQUIS) 2.5 MG TABS tablet Take 1 tablet (2.5 mg total) by mouth 2 (two) times daily. 03/07/21  Yes Luetta Nutting, DO  atorvastatin (LIPITOR) 20 MG tablet TAKE 1 TABLET  BY MOUTH EVERY DAY 11/12/20  Yes Breeback, Jade L, PA-C  clopidogrel (PLAVIX) 75 MG tablet TAKE 1 TABLET BY MOUTH EVERY DAY 11/08/20  Yes Silverio Decamp, MD  escitalopram (LEXAPRO) 20 MG tablet Take 1 tablet (20 mg total) by mouth daily. 03/07/21  Yes Luetta Nutting, DO  fluticasone (FLONASE) 50 MCG/ACT nasal spray One spray in each nostril twice a day, use left hand for right nostril, and right hand for left nostril. 01/18/20  Yes Emeterio Reeve, DO  irbesartan (AVAPRO) 75 MG tablet TAKE 1 TABLET BY MOUTH EVERY DAY 02/25/21  Yes Breeback, Jade L, PA-C  linagliptin (TRADJENTA) 5 MG TABS tablet Take 1 tablet (5 mg total) by mouth daily. 05/17/20  Yes Emeterio Reeve, DO  methocarbamol (ROBAXIN) 500 MG tablet Take 1 tablet (500 mg total) by mouth 2 (two) times daily as needed for muscle spasms. 04/09/20  Yes Emeterio Reeve, DO  metoprolol succinate (TOPROL-XL) 50 MG 24 hr tablet Take 1 tablet (50 mg total) by mouth daily. 03/07/21  Yes Luetta Nutting, DO  molnupiravir EUA (LAGEVRIO) 200 mg CAPS capsule Take 4 capsules (800 mg total) by mouth 2 (two) times daily for 5 days. 03/15/21 03/20/21 Yes Kandra Nicolas, MD  nitroGLYCERIN (NITROSTAT) 0.6 MG SL tablet Place 0.6 mg under the tongue every 5 (five) minutes as needed for chest pain.   Yes [provider]  Nystatin POWD Apply liberally to affected area 2 times per day 04/03/20  Yes Emeterio Reeve, DO  ondansetron (ZOFRAN-ODT) 8 MG disintegrating tablet Take 1 tablet (8 mg total) by mouth every 8 (eight) hours as needed for nausea or vomiting. 02/09/21  Yes Eliezer Lofts, FNP  oxybutynin (DITROPAN-XL) 10 MG 24 hr tablet TAKE 1 TABLET BY MOUTH EVERYDAY AT BEDTIME 11/12/20  Yes Breeback, Jade L, PA-C  traMADol (ULTRAM) 50 MG tablet Take 1 tablet (50 mg total) by mouth every 8 (eight) hours as needed for severe pain. 03/07/21 06/05/21 Yes Luetta Nutting, DO  predniSONE (DELTASONE) 50 MG tablet Take 1 tab PO daily x5 days. 03/07/21    Luetta Nutting, DO    Family History Family History  Problem Relation Age of Onset   Diabetes Sister     Social History Social History   Tobacco Use   Smoking status: Former   Smokeless tobacco: Never  Substance Use Topics   Alcohol use: Not Currently   Drug use: Never     Allergies   Aspirin, Doxepin, Shellfish allergy, and Sertraline   Review of Systems Review of Systems No sore throat + cough No pleuritic pain No wheezing No nasal congestion No post-nasal drainage No sinus pain/pressure No itchy/red eyes No earache No hemoptysis + SOB No fever/chills + nausea + vomiting, resolved No abdominal pain No diarrhea No urinary symptoms No skin rash + fatigue No myalgias No headache    Physical Exam Triage Vital Signs ED Triage Vitals  Enc Vitals Group     BP 03/15/21 1304 119/76     Pulse Rate 03/15/21 1304 86     Resp 03/15/21 1304 18     Temp 03/15/21 1304 98.3 F (36.8 C)     Temp Source 03/15/21 1304 Oral  SpO2 03/15/21 1304 99 %     Weight 03/15/21 1303 180 lb (81.6 kg)     Height 03/15/21 1303 5\' 9"  (1.753 m)     Head Circumference --      Peak Flow --      Pain Score 03/15/21 1303 0     Pain Loc --      Pain Edu? --      Excl. in [place]? --    No data found.  Updated Vital Signs BP 119/76 (BP Location: Left Arm)    Pulse 86    Temp 98.3 F (36.8 C) (Oral)    Resp 18    Ht 5\' 9"  (1.753 m)    Wt 81.6 kg    SpO2 99%    BMI 26.58 kg/m   Visual Acuity Right Eye Distance:   Left Eye Distance:   Bilateral Distance:    Right Eye Near:   Left Eye Near:    Bilateral Near:     Physical Exam Nursing notes and Vital Signs reviewed. Appearance:  Patient appears stated age, and in no acute distress Eyes:  Pupils are equal, round, and reactive to light and accomodation.  Extraocular movement is intact.  Conjunctivae are not inflamed  Ears:  Canals normal.  Tympanic membranes normal.  Nose:  Mildly congested turbinates.  No sinus tenderness.    Pharynx:  Normal Neck:  Supple.  Mildly enlarged lateral nodes are present, tender to palpation on the left.   Lungs:  Clear to auscultation.  Breath sounds are equal.  Moving air well. Heart:  Regular rate and rhythm without murmurs, rubs, or gallops.  Abdomen:  Nontender without masses or hepatosplenomegaly.  Bowel sounds are present.  No CVA or flank tenderness.  Extremities:  No edema.  Skin:  No rash present.   UC Treatments / Results  Labs (all labs ordered are listed, but only abnormal results are displayed) Labs Reviewed - No data to display  EKG   Radiology No results found.  Procedures Procedures (including critical care time)  Medications Ordered in UC Medications - No data to display  Initial Impression / Assessment and Plan / UC Course  I have reviewed the triage vital signs and the nursing notes.  Pertinent labs & imaging results that were available during my care of the patient were reviewed by me and considered in my medical decision making (see chart for details).    Benign exam. Because of multiple risk factors, will begin molnupiravir. Followup with Family Doctor if not improved in about 5 days.  Final Clinical Impressions(s) / UC Diagnoses   Final diagnoses:  DGLOV-56 virus infection     Discharge Instructions       Take plain guaifenesin (600mg  or 1200mg  extended release tabs such as Mucinex) twice daily, with plenty of water, for cough and congestion.  Get adequate rest.   Try warm salt water gargles for sore throat.  May take Delsym Cough Suppressant ("12 Hour Cough Relief") at bedtime for nighttime cough.  Stop all antihistamines for now, and other non-prescription cough/cold preparations. Recommend obtaining a pulse oximeter (oxygen meter) and check your oxygen regularly.  Go to an emergency room if oxygen begins decreasing and you develop increasing shortness of breath.   Your COVID-19 test is positive.  Isolate yourself for five days  from the time of your symptom onset.  At the end of five days you may end isolation if your symptoms have cleared or improved, and you have not  had a fever for 24 hours. At this time you should wear a mask for five more days when you are around others.                  ED Prescriptions     Medication Sig Dispense Auth. Provider   molnupiravir EUA (LAGEVRIO) 200 mg CAPS capsule Take 4 capsules (800 mg total) by mouth 2 (two) times daily for 5 days. 40 capsule Kandra Nicolas, MD         Kandra Nicolas, MD 03/18/21 1012

## 2021-03-21 ENCOUNTER — Other Ambulatory Visit: Payer: Self-pay

## 2021-03-21 DIAGNOSIS — Z8739 Personal history of other diseases of the musculoskeletal system and connective tissue: Secondary | ICD-10-CM

## 2021-03-21 MED ORDER — ALLOPURINOL 100 MG PO TABS: 100.0000 mg | ORAL_TABLET | Freq: Every day | ORAL | 3 refills | Status: DC

## 2021-03-27 DIAGNOSIS — I10 Essential (primary) hypertension: Secondary | ICD-10-CM | POA: Diagnosis not present

## 2021-03-27 DIAGNOSIS — I25118 Atherosclerotic heart disease of native coronary artery with other forms of angina pectoris: Secondary | ICD-10-CM | POA: Diagnosis not present

## 2021-03-27 DIAGNOSIS — I480 Paroxysmal atrial fibrillation: Secondary | ICD-10-CM | POA: Diagnosis not present

## 2021-04-22 DIAGNOSIS — Z20822 Contact with and (suspected) exposure to covid-19: Secondary | ICD-10-CM | POA: Diagnosis not present

## 2021-05-08 DIAGNOSIS — I480 Paroxysmal atrial fibrillation: Secondary | ICD-10-CM | POA: Diagnosis not present

## 2021-05-08 DIAGNOSIS — I25118 Atherosclerotic heart disease of native coronary artery with other forms of angina pectoris: Secondary | ICD-10-CM | POA: Diagnosis not present

## 2021-05-08 DIAGNOSIS — I10 Essential (primary) hypertension: Secondary | ICD-10-CM | POA: Diagnosis not present

## 2021-05-20 ENCOUNTER — Other Ambulatory Visit: Payer: Self-pay | Admitting: Physician Assistant

## 2021-05-20 DIAGNOSIS — Z8673 Personal history of transient ischemic attack (TIA), and cerebral infarction without residual deficits: Secondary | ICD-10-CM

## 2021-05-20 DIAGNOSIS — E1169 Type 2 diabetes mellitus with other specified complication: Secondary | ICD-10-CM

## 2021-05-21 ENCOUNTER — Other Ambulatory Visit: Payer: Self-pay | Admitting: Osteopathic Medicine

## 2021-05-21 DIAGNOSIS — E1122 Type 2 diabetes mellitus with diabetic chronic kidney disease: Secondary | ICD-10-CM

## 2021-05-24 DIAGNOSIS — Z20822 Contact with and (suspected) exposure to covid-19: Secondary | ICD-10-CM | POA: Diagnosis not present

## 2021-05-27 DIAGNOSIS — Z20822 Contact with and (suspected) exposure to covid-19: Secondary | ICD-10-CM | POA: Diagnosis not present

## 2021-06-06 ENCOUNTER — Other Ambulatory Visit: Payer: Self-pay | Admitting: Physician Assistant

## 2021-06-06 DIAGNOSIS — I480 Paroxysmal atrial fibrillation: Secondary | ICD-10-CM | POA: Diagnosis not present

## 2021-06-06 DIAGNOSIS — I10 Essential (primary) hypertension: Secondary | ICD-10-CM | POA: Diagnosis not present

## 2021-06-06 DIAGNOSIS — I152 Hypertension secondary to endocrine disorders: Secondary | ICD-10-CM

## 2021-06-06 DIAGNOSIS — I25118 Atherosclerotic heart disease of native coronary artery with other forms of angina pectoris: Secondary | ICD-10-CM | POA: Diagnosis not present

## 2021-06-13 DIAGNOSIS — Z20822 Contact with and (suspected) exposure to covid-19: Secondary | ICD-10-CM | POA: Diagnosis not present

## 2021-06-17 DIAGNOSIS — Z20822 Contact with and (suspected) exposure to covid-19: Secondary | ICD-10-CM | POA: Diagnosis not present

## 2021-06-19 DIAGNOSIS — G4730 Sleep apnea, unspecified: Secondary | ICD-10-CM | POA: Diagnosis not present

## 2021-06-19 DIAGNOSIS — K219 Gastro-esophageal reflux disease without esophagitis: Secondary | ICD-10-CM | POA: Diagnosis not present

## 2021-06-19 DIAGNOSIS — Z79899 Other long term (current) drug therapy: Secondary | ICD-10-CM | POA: Diagnosis not present

## 2021-06-19 DIAGNOSIS — I480 Paroxysmal atrial fibrillation: Secondary | ICD-10-CM | POA: Diagnosis not present

## 2021-06-19 DIAGNOSIS — Z7901 Long term (current) use of anticoagulants: Secondary | ICD-10-CM | POA: Diagnosis not present

## 2021-06-19 DIAGNOSIS — I25118 Atherosclerotic heart disease of native coronary artery with other forms of angina pectoris: Secondary | ICD-10-CM | POA: Diagnosis not present

## 2021-06-19 DIAGNOSIS — I10 Essential (primary) hypertension: Secondary | ICD-10-CM | POA: Diagnosis not present

## 2021-07-03 ENCOUNTER — Other Ambulatory Visit: Payer: Self-pay | Admitting: Family Medicine

## 2021-07-03 MED ORDER — TRAMADOL HCL 50 MG PO TABS: 50.0000 mg | ORAL_TABLET | Freq: Three times a day (TID) | ORAL | 0 refills | Status: DC | PRN

## 2021-07-03 NOTE — Telephone Encounter (Signed)
Completed.

## 2021-07-03 NOTE — Telephone Encounter (Signed)
Patient came in to office and would like a script for Tramidol - he does have an appt schedule for a F/U on 09/04/21. Patient stated he is in pain. - lmr

## 2021-07-15 DIAGNOSIS — I25118 Atherosclerotic heart disease of native coronary artery with other forms of angina pectoris: Secondary | ICD-10-CM | POA: Diagnosis not present

## 2021-07-15 DIAGNOSIS — I480 Paroxysmal atrial fibrillation: Secondary | ICD-10-CM | POA: Diagnosis not present

## 2021-07-15 DIAGNOSIS — I10 Essential (primary) hypertension: Secondary | ICD-10-CM | POA: Diagnosis not present

## 2021-07-30 DIAGNOSIS — Z87891 Personal history of nicotine dependence: Secondary | ICD-10-CM | POA: Diagnosis not present

## 2021-07-30 DIAGNOSIS — F32A Depression, unspecified: Secondary | ICD-10-CM | POA: Diagnosis not present

## 2021-07-30 DIAGNOSIS — F331 Major depressive disorder, recurrent, moderate: Secondary | ICD-10-CM | POA: Diagnosis not present

## 2021-07-30 DIAGNOSIS — F329 Major depressive disorder, single episode, unspecified: Secondary | ICD-10-CM | POA: Diagnosis not present

## 2021-07-30 DIAGNOSIS — F411 Generalized anxiety disorder: Secondary | ICD-10-CM | POA: Diagnosis not present

## 2021-07-30 DIAGNOSIS — Z91013 Allergy to seafood: Secondary | ICD-10-CM | POA: Diagnosis not present

## 2021-07-30 DIAGNOSIS — Z886 Allergy status to analgesic agent status: Secondary | ICD-10-CM | POA: Diagnosis not present

## 2021-07-30 DIAGNOSIS — R918 Other nonspecific abnormal finding of lung field: Secondary | ICD-10-CM | POA: Diagnosis not present

## 2021-07-30 DIAGNOSIS — Z888 Allergy status to other drugs, medicaments and biological substances status: Secondary | ICD-10-CM | POA: Diagnosis not present

## 2021-07-30 DIAGNOSIS — E119 Type 2 diabetes mellitus without complications: Secondary | ICD-10-CM | POA: Diagnosis not present

## 2021-07-30 DIAGNOSIS — T1491XA Suicide attempt, initial encounter: Secondary | ICD-10-CM | POA: Diagnosis not present

## 2021-07-31 DIAGNOSIS — E1122 Type 2 diabetes mellitus with diabetic chronic kidney disease: Secondary | ICD-10-CM | POA: Diagnosis not present

## 2021-07-31 DIAGNOSIS — F332 Major depressive disorder, recurrent severe without psychotic features: Secondary | ICD-10-CM | POA: Diagnosis not present

## 2021-07-31 DIAGNOSIS — I10 Essential (primary) hypertension: Secondary | ICD-10-CM | POA: Diagnosis not present

## 2021-07-31 DIAGNOSIS — F5105 Insomnia due to other mental disorder: Secondary | ICD-10-CM | POA: Diagnosis not present

## 2021-07-31 DIAGNOSIS — E785 Hyperlipidemia, unspecified: Secondary | ICD-10-CM | POA: Diagnosis not present

## 2021-07-31 DIAGNOSIS — F419 Anxiety disorder, unspecified: Secondary | ICD-10-CM | POA: Diagnosis not present

## 2021-07-31 DIAGNOSIS — Z8673 Personal history of transient ischemic attack (TIA), and cerebral infarction without residual deficits: Secondary | ICD-10-CM | POA: Diagnosis not present

## 2021-07-31 DIAGNOSIS — N183 Chronic kidney disease, stage 3 unspecified: Secondary | ICD-10-CM | POA: Diagnosis not present

## 2021-07-31 DIAGNOSIS — N1831 Chronic kidney disease, stage 3a: Secondary | ICD-10-CM | POA: Diagnosis not present

## 2021-07-31 DIAGNOSIS — I480 Paroxysmal atrial fibrillation: Secondary | ICD-10-CM | POA: Diagnosis not present

## 2021-07-31 DIAGNOSIS — I25118 Atherosclerotic heart disease of native coronary artery with other forms of angina pectoris: Secondary | ICD-10-CM | POA: Diagnosis not present

## 2021-07-31 DIAGNOSIS — D696 Thrombocytopenia, unspecified: Secondary | ICD-10-CM | POA: Diagnosis not present

## 2021-07-31 DIAGNOSIS — M1990 Unspecified osteoarthritis, unspecified site: Secondary | ICD-10-CM | POA: Diagnosis not present

## 2021-07-31 DIAGNOSIS — M5136 Other intervertebral disc degeneration, lumbar region: Secondary | ICD-10-CM | POA: Diagnosis not present

## 2021-07-31 DIAGNOSIS — K5981 Ogilvie syndrome: Secondary | ICD-10-CM | POA: Diagnosis not present

## 2021-07-31 DIAGNOSIS — I129 Hypertensive chronic kidney disease with stage 1 through stage 4 chronic kidney disease, or unspecified chronic kidney disease: Secondary | ICD-10-CM | POA: Diagnosis not present

## 2021-07-31 DIAGNOSIS — Z932 Ileostomy status: Secondary | ICD-10-CM | POA: Diagnosis not present

## 2021-07-31 DIAGNOSIS — K219 Gastro-esophageal reflux disease without esophagitis: Secondary | ICD-10-CM | POA: Diagnosis not present

## 2021-07-31 DIAGNOSIS — D509 Iron deficiency anemia, unspecified: Secondary | ICD-10-CM | POA: Diagnosis not present

## 2021-07-31 DIAGNOSIS — Z96643 Presence of artificial hip joint, bilateral: Secondary | ICD-10-CM | POA: Diagnosis not present

## 2021-07-31 DIAGNOSIS — I4892 Unspecified atrial flutter: Secondary | ICD-10-CM | POA: Diagnosis not present

## 2021-07-31 DIAGNOSIS — G4733 Obstructive sleep apnea (adult) (pediatric): Secondary | ICD-10-CM | POA: Diagnosis not present

## 2021-07-31 DIAGNOSIS — R45851 Suicidal ideations: Secondary | ICD-10-CM | POA: Diagnosis not present

## 2021-07-31 DIAGNOSIS — Z8546 Personal history of malignant neoplasm of prostate: Secondary | ICD-10-CM | POA: Diagnosis not present

## 2021-07-31 DIAGNOSIS — D539 Nutritional anemia, unspecified: Secondary | ICD-10-CM | POA: Diagnosis not present

## 2021-07-31 DIAGNOSIS — Z8739 Personal history of other diseases of the musculoskeletal system and connective tissue: Secondary | ICD-10-CM | POA: Diagnosis not present

## 2021-07-31 DIAGNOSIS — N3281 Overactive bladder: Secondary | ICD-10-CM | POA: Diagnosis not present

## 2021-07-31 DIAGNOSIS — M48062 Spinal stenosis, lumbar region with neurogenic claudication: Secondary | ICD-10-CM | POA: Diagnosis not present

## 2021-07-31 DIAGNOSIS — M109 Gout, unspecified: Secondary | ICD-10-CM | POA: Diagnosis not present

## 2021-07-31 LAB — BASIC METABOLIC PANEL: Glucose: 96

## 2021-07-31 LAB — TSH: TSH: 3.56 (ref 0.41–5.90)

## 2021-07-31 LAB — CBC AND DIFFERENTIAL
HCT: 39 — AB (ref 41–53)
Hemoglobin: 12.5 — AB (ref 13.5–17.5)
WBC: 9.7

## 2021-07-31 LAB — LIPID PANEL
Cholesterol: 137 (ref 0–200)
HDL: 62 (ref 35–70)
LDL Cholesterol: 63
LDl/HDL Ratio: 2
Triglycerides: 60 (ref 40–160)

## 2021-07-31 LAB — HEMOGLOBIN A1C: Hemoglobin A1C: 5.9

## 2021-07-31 LAB — CBC: RBC: 5.09 (ref 3.87–5.11)

## 2021-08-01 DIAGNOSIS — G4733 Obstructive sleep apnea (adult) (pediatric): Secondary | ICD-10-CM | POA: Insufficient documentation

## 2021-08-02 LAB — COMPREHENSIVE METABOLIC PANEL: eGFR: 40

## 2021-08-14 ENCOUNTER — Ambulatory Visit (INDEPENDENT_AMBULATORY_CARE_PROVIDER_SITE_OTHER): Payer: Medicare Other | Admitting: Physician Assistant

## 2021-08-14 ENCOUNTER — Encounter: Payer: Self-pay | Admitting: Physician Assistant

## 2021-08-14 VITALS — BP 155/58 | HR 42 | Ht 69.0 in | Wt 183.0 lb

## 2021-08-14 DIAGNOSIS — R001 Bradycardia, unspecified: Secondary | ICD-10-CM | POA: Insufficient documentation

## 2021-08-14 DIAGNOSIS — I480 Paroxysmal atrial fibrillation: Secondary | ICD-10-CM | POA: Diagnosis not present

## 2021-08-14 DIAGNOSIS — Z79899 Other long term (current) drug therapy: Secondary | ICD-10-CM | POA: Diagnosis not present

## 2021-08-14 DIAGNOSIS — K5289 Other specified noninfective gastroenteritis and colitis: Secondary | ICD-10-CM

## 2021-08-14 DIAGNOSIS — I440 Atrioventricular block, first degree: Secondary | ICD-10-CM | POA: Diagnosis not present

## 2021-08-14 DIAGNOSIS — F332 Major depressive disorder, recurrent severe without psychotic features: Secondary | ICD-10-CM | POA: Diagnosis not present

## 2021-08-14 DIAGNOSIS — Z933 Colostomy status: Secondary | ICD-10-CM | POA: Diagnosis not present

## 2021-08-14 DIAGNOSIS — Z8659 Personal history of other mental and behavioral disorders: Secondary | ICD-10-CM | POA: Diagnosis not present

## 2021-08-14 MED ORDER — ESCITALOPRAM OXALATE 10 MG PO TABS: 10.0000 mg | ORAL_TABLET | Freq: Every day | ORAL | 1 refills | Status: DC

## 2021-08-14 NOTE — Progress Notes (Addendum)
Established Patient Office Visit  Subjective   Patient ID: Damon Acosta, male    DOB: 06-09-1940  Age: 81 y.o. MRN: [id number]  Chief Complaint  Patient presents with   Hospitalization Follow-up    HPI Pt is a 81 yo male with history of PAF, CAD, OSA, NSTEMI, T2DM, depression and recent suicidal ideation and attempt who presents to the clinic for follow up. He presented to the ED after wife found him in the garage with a knife and rope about to "hang himself and cut his wrist" on 07/30/2021. He was admitted on 6/21 and discharged on 6/22. He was started on lexapro. His avapro, gabapentin, omeprazole, Most of his mood issues are with his declining health. He cannot drive or walk well. He has a colostomy bag with no GI. He does have [place] appt on 08/06/2021 with [facility]. He does feel like his mood is better. No suicidal thoughts or plans.  He continues to have moments of frustration and depression because of the things he cannot do. He is not checking BP at home. He does feel a little more tired. No SOB or peripheral edema. He has been on lexapro before.    .. 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   Primary osteoarthritis of both knees 02/17/2018   Mild cognitive impairment 04/20/2018   Microcytic anemia 04/20/2018   CKD (chronic kidney disease) stage 3, GFR 30-59 ml/min ([name]) 04/27/2018   Asymptomatic PVCs 07/27/2018   History of MI (myocardial infarction)  10/21/2019   Atrial flutter ([place]) 09/15/2019   Diversion colitis 10/07/2019   Gastroesophageal reflux disease without esophagitis 10/07/2019   Ileostomy in place [facility]) 12/15/2017   NSTEMI (non-ST elevated myocardial infarction) ([place]) 09/15/2019   PAF (paroxysmal atrial fibrillation) ([place]) 09/16/2019   Recurrent major depressive disorder, in partial remission ([place]) 09/15/2019   Coronary artery disease of native artery of native heart with stable angina pectoris ([place]) 09/15/2019   Severe episode of recurrent major depressive disorder, without psychotic features ([place]) 09/15/2019   OSA (obstructive sleep apnea) 08/01/2021   AV block, 1st degree 08/14/2021   Sinus bradycardia on ECG 08/14/2021   Resolved Ambulatory Problems    Diagnosis Date Noted   Hyperlipidemia 12/15/2017   Hyperkalemia 01/06/2018   Elevated serum creatinine 01/06/2018   History of gout 01/06/2018   Dyspepsia 01/06/2018   Current moderate episode of major depressive disorder without prior episode ([place]) 01/06/2018   Anxiety about health 01/06/2018   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    Objective:     BP (!) 155/58  Pulse (!) 42   Ht '5\' 9"'$  (1.753 m)   Wt 183 lb (83 kg)   SpO2 99%   BMI 27.02 kg/m  BP Readings from Last 3 Encounters:  08/14/21 (!) 155/58  03/15/21 119/76  03/07/21 105/63   Wt Readings from Last 3 Encounters:  08/14/21 183 lb (83 kg)  03/15/21 180 lb (81.6 kg)  03/07/21 186 lb (84.4 kg)   ..    08/14/2021    8:02 AM 03/07/2021    2:27 PM 08/19/2019   10:26 AM 08/12/2019   10:15 AM 02/23/2018    2:05 PM  Depression screen PHQ 2/9   Decreased Interest 1 0 '3 3 2  '$ Down, Depressed, Hopeless 1 0 '2 3 1  '$ PHQ - 2 Score 2 0 '5 6 3  '$ Altered sleeping '1  3 3 2  '$ Tired, decreased energy '2  3 2 2  '$ Change in appetite '1  2 3 1  '$ Feeling bad or failure about yourself  '2  3 3 2  '$ Trouble concentrating '1  2 3 1  '$ Moving slowly or fidgety/restless '2  1 3 1  '$ Suicidal thoughts '2  3 3 2  '$ PHQ-9 Score '13  22 26 14  '$ Difficult doing work/chores Not difficult at all    Not difficult at all   ..    08/14/2021    8:02 AM 08/19/2019   10:27 AM 08/12/2019   10:16 AM 02/23/2018    2:06 PM  GAD 7 : Generalized Anxiety Score  Nervous, Anxious, on Edge '2 2 3 1  '$ Control/stop worrying '2 3 3 1  '$ Worry too much - different things '3 3 3 1  '$ Trouble relaxing '2 2 2 2  '$ Restless '1 1 2 1  '$ Easily annoyed or irritable '3 3 3 2  '$ Afraid - awful might happen '3 2 3 2  '$ Total GAD 7 Score '16 16 19 10  '$ Anxiety Difficulty Not difficult at all Somewhat difficult  Somewhat difficult        Physical Exam Constitutional:      Appearance: Normal appearance.  HENT:     Head: Normocephalic.  Cardiovascular:     Rate and Rhythm: Regular rhythm. Bradycardia present.     Pulses: Normal pulses.     Heart sounds: Normal heart sounds.  Pulmonary:     Effort: Pulmonary effort is normal.     Breath sounds: Normal breath sounds.  Musculoskeletal:     Right lower leg: No edema.     Left lower leg: No edema.  Neurological:     General: No focal deficit present.     Mental Status: He is alert and oriented to person, place, and time.  Psychiatric:        Mood and Affect: Mood normal.     EKG showed 1st degree AV block with sinus bradycardia at 42.     Assessment & Plan:  Marland KitchenMarland KitchenElhadji was seen today for hospitalization follow-up.  Diagnoses and all orders for this visit:  Severe episode of recurrent major depressive disorder, without psychotic features ([place]) -     escitalopram (LEXAPRO) 10 MG tablet; Take 1 tablet (10 mg total) by mouth daily.  History of suicidal  ideation  Colostomy status ([place]) -     Ambulatory referral to Gastroenterology  Sinus bradycardia on ECG -     BASIC METABOLIC PANEL WITH GFR  AV block, 1st degree -     BASIC METABOLIC PANEL WITH GFR  PAF (paroxysmal atrial fibrillation) ([name]) -  BASIC METABOLIC PANEL WITH GFR  Medication management  Diversion colitis -     Ambulatory referral to Gastroenterology   PHQ/GAD numbers not to goal but moving in right direction Stay on lexapro '10mg'$  it could take a few more weeks to get to therapeutic doses Follow up with [facility] and PCP.  Will check BMP today HR low EKG showed 1st degree AV block Cut metoprolol in half to '25mg'$  daily Systolic BP high likely due to being taken off ARB but will wait on cardiology to weigh in on that restart Follow up with cardiology asap Discussed weakness/syncope to go to ED  Will get him in with GI just to stay up to date on anything with colostomy bag that could make it easier on him.   Spent 40 minutes with patient reviewing chart, working up low HR, coordinating care.    Iran Planas, PA-C

## 2021-08-14 NOTE — Patient Instructions (Addendum)
Will get GI referral in [place]. Continue on lexapro. Cut metoprolol in half to '25mg'$  Will get labs today  First-Degree Atrioventricular Block  First-degree atrioventricular (AV) block is a condition that causes the electrical signals that travel from the heart's upper chambers (atria) to its lower chambers (ventricles) to move too slowly. As a result, the heart may beat more slowly than normal. First-degree AV block is the least serious type of heart block. Second- and third-degree AV blocks are more serious. First-degree AV block can increase your risk of developing a type of irregular heartbeat called atrial fibrillation. It is also associated with a higher risk of needing a pacemaker in the future. What are the causes? This condition may be caused by: Any condition that damages the electrical pathway that controls the heart's rate and rhythm, such as a heart attack. Overstimulation of the nerve that slows down the heart rate (vagus nerve). This cause is common among well-conditioned athletes. Some medicines that slow down the heart rate, such as beta blockers or calcium channel blockers. Surgery that damages the heart. Some people are born with this condition (congenital heart block), but most people develop it over time. What increases the risk? The risk for this condition increases with age. You are also more likely to develop this condition if you have: A history of heart attack. Heart failure. Coronary heart disease. Inflammation of heart muscle (myocarditis). Disease of heart muscle (cardiomyopathy). Infection of the heart valves (endocarditis). Infections or diseases that affect the heart. These include: Lyme disease. Sarcoidosis. Hemochromatosis. Rheumatic fever. Certain muscle disorders. Babies are more likely to be born with heart block if: The baby's mother has an autoimmune disease, such as lupus. The baby is born with a heart defect that affects the heart's structure. A  parent was born with a heart defect. What are the signs or symptoms? This condition usually does not cause any symptoms. How is this diagnosed? This condition may be diagnosed based on: A physical exam. Your medical history. A measurement of your pulse or heartbeat. Tests. These may include: An electrocardiogram (ECG). This checks for problems with electrical activity in the heart. Ambulatory cardiac monitoring. This is a portable ECG that you wear. It checks your heart's rhythm. An electrophysiology (EP) study. Long, thin tubes (catheters) are placed in your heart. The catheters give information about your heart's electrical signals. How is this treated? Usually, treatment is not needed for this condition. In some cases, treatment involves: Treating an underlying condition, such as heart disease. Changing or stopping any heart medicines that can cause heart block. Follow these instructions at home: Alcohol use Do not drink alcohol if: Your health care provider tells you not to drink. You are pregnant, may be pregnant, or are planning to become pregnant. If you drink alcohol: Limit how much you use to: 0-1 drink a day for women. 0-2 drinks a day for men. Be aware of how much alcohol is in your drink. In the [place] one drink equals one 12 oz bottle of beer (355 mL), one 5 oz glass of wine (148 mL), or one 1 oz glass of hard liquor (44 mL). General instructions  Take over-the-counter and prescription medicines only as told by your health care provider. Follow your health care provider's recommendations to help reduce your risk of heart disease. These may include: Exercising at least 30 minutes on 5 or more days each week (150 minutes). Ask your health care provider what type of exercise is safe for you. Eating a  heart-healthy diet with fruits and vegetables, whole grains, low-fat dairy products, and lean proteins like poultry and eggs. Your health care provider or dietitian can help you  make healthy choices. Maintaining a healthy weight. Do not use any products that contain nicotine or tobacco, such as cigarettes, e-cigarettes, and chewing tobacco. If you need help quitting, ask your health care provider. Keep all follow-up visits as told by your health care provider. This is important. Where to find more information American Heart Association: www.heart.org National Heart, Lung, and Blood Institute: https://wilson-eaton.com/ Contact a health care provider if you: Feel like your heart is skipping beats. Feel more tired than normal. Have swelling in your hands, feet, or lower legs. Get help right away if you: Have symptoms that change or get worse. Develop new symptoms. Have chest pain, especially if the pain: Feels like crushing or pressure. Spreads to your arms, back, neck, or jaw. Feel short of breath. Feel light-headed or weak. Faint. These symptoms may represent a serious problem that is an emergency. Do not wait to see if the symptoms will go away. Get medical help right away. Call your local emergency services (911 in the [place] Do not drive yourself to the hospital. Summary First-degree atrioventricular (AV) block is the least serious type of heart block. In this condition, the signals that control heart rate move too slowly. As a result, the heart may beat more slowly than normal. Usually, treatment is not needed for this condition. In some cases, you may need to change or stop medicines that may be making the condition worse. Healthy lifestyle choices such as exercising regularly, eating a healthy diet, and limiting alcohol are good for your heart. This information is not intended to replace advice given to you by your health care provider. Make sure you discuss any questions you have with your health care provider. Document Revised: 12/06/2018 Document Reviewed: 12/06/2018 Elsevier Patient Education  [place].

## 2021-08-15 LAB — BASIC METABOLIC PANEL WITH GFR
BUN/Creatinine Ratio: 15 (calc) (ref 6–22)
BUN: 28 mg/dL — ABNORMAL HIGH (ref 7–25)
CO2: 24 mmol/L (ref 20–32)
Calcium: 9.3 mg/dL (ref 8.6–10.3)
Chloride: 112 mmol/L — ABNORMAL HIGH (ref 98–110)
Creat: 1.81 mg/dL — ABNORMAL HIGH (ref 0.70–1.22)
Glucose, Bld: 88 mg/dL (ref 65–99)
Potassium: 5 mmol/L (ref 3.5–5.3)
Sodium: 143 mmol/L (ref 135–146)
eGFR: 37 mL/min/{1.73_m2} — ABNORMAL LOW (ref >=60)

## 2021-08-15 NOTE — Addendum Note (Signed)
Addended by: Narda Rutherford on: 08/15/2021 09:28 AM   Modules accepted: Orders

## 2021-08-15 NOTE — Progress Notes (Signed)
Kidney function is decreased but appears stable.   Sodium and potassium in normal range.   JJ, make sure patient has upcoming appt with cardiology to follow up on new AV block.

## 2021-08-16 DIAGNOSIS — I4892 Unspecified atrial flutter: Secondary | ICD-10-CM | POA: Diagnosis not present

## 2021-08-16 DIAGNOSIS — E875 Hyperkalemia: Secondary | ICD-10-CM | POA: Diagnosis not present

## 2021-08-16 DIAGNOSIS — I480 Paroxysmal atrial fibrillation: Secondary | ICD-10-CM | POA: Diagnosis not present

## 2021-08-21 DIAGNOSIS — R000 Tachycardia, unspecified: Secondary | ICD-10-CM | POA: Diagnosis not present

## 2021-08-21 DIAGNOSIS — Z888 Allergy status to other drugs, medicaments and biological substances status: Secondary | ICD-10-CM | POA: Diagnosis not present

## 2021-08-21 DIAGNOSIS — Z91013 Allergy to seafood: Secondary | ICD-10-CM | POA: Diagnosis not present

## 2021-08-21 DIAGNOSIS — K9409 Other complications of colostomy: Secondary | ICD-10-CM | POA: Diagnosis not present

## 2021-08-21 DIAGNOSIS — N183 Chronic kidney disease, stage 3 unspecified: Secondary | ICD-10-CM | POA: Diagnosis not present

## 2021-08-21 DIAGNOSIS — Z79899 Other long term (current) drug therapy: Secondary | ICD-10-CM | POA: Diagnosis not present

## 2021-08-21 DIAGNOSIS — Z8673 Personal history of transient ischemic attack (TIA), and cerebral infarction without residual deficits: Secondary | ICD-10-CM | POA: Diagnosis not present

## 2021-08-21 DIAGNOSIS — E1122 Type 2 diabetes mellitus with diabetic chronic kidney disease: Secondary | ICD-10-CM | POA: Diagnosis not present

## 2021-08-21 DIAGNOSIS — I129 Hypertensive chronic kidney disease with stage 1 through stage 4 chronic kidney disease, or unspecified chronic kidney disease: Secondary | ICD-10-CM | POA: Diagnosis not present

## 2021-08-21 DIAGNOSIS — Z87891 Personal history of nicotine dependence: Secondary | ICD-10-CM | POA: Diagnosis not present

## 2021-08-21 DIAGNOSIS — Z886 Allergy status to analgesic agent status: Secondary | ICD-10-CM | POA: Diagnosis not present

## 2021-08-23 DIAGNOSIS — K5904 Chronic idiopathic constipation: Secondary | ICD-10-CM | POA: Diagnosis not present

## 2021-08-23 DIAGNOSIS — Z933 Colostomy status: Secondary | ICD-10-CM | POA: Diagnosis not present

## 2021-08-27 DIAGNOSIS — I480 Paroxysmal atrial fibrillation: Secondary | ICD-10-CM | POA: Diagnosis not present

## 2021-08-27 DIAGNOSIS — I10 Essential (primary) hypertension: Secondary | ICD-10-CM | POA: Diagnosis not present

## 2021-09-04 ENCOUNTER — Ambulatory Visit (INDEPENDENT_AMBULATORY_CARE_PROVIDER_SITE_OTHER): Payer: Medicare Other | Admitting: Family Medicine

## 2021-09-04 ENCOUNTER — Encounter: Payer: Self-pay | Admitting: Family Medicine

## 2021-09-04 DIAGNOSIS — E1159 Type 2 diabetes mellitus with other circulatory complications: Secondary | ICD-10-CM | POA: Diagnosis not present

## 2021-09-04 DIAGNOSIS — Z8673 Personal history of transient ischemic attack (TIA), and cerebral infarction without residual deficits: Secondary | ICD-10-CM | POA: Diagnosis not present

## 2021-09-04 DIAGNOSIS — N1832 Chronic kidney disease, stage 3b: Secondary | ICD-10-CM | POA: Diagnosis not present

## 2021-09-04 DIAGNOSIS — I480 Paroxysmal atrial fibrillation: Secondary | ICD-10-CM | POA: Diagnosis not present

## 2021-09-04 DIAGNOSIS — Z8739 Personal history of other diseases of the musculoskeletal system and connective tissue: Secondary | ICD-10-CM

## 2021-09-04 DIAGNOSIS — E1169 Type 2 diabetes mellitus with other specified complication: Secondary | ICD-10-CM

## 2021-09-04 DIAGNOSIS — E785 Hyperlipidemia, unspecified: Secondary | ICD-10-CM | POA: Diagnosis not present

## 2021-09-04 DIAGNOSIS — I152 Hypertension secondary to endocrine disorders: Secondary | ICD-10-CM

## 2021-09-04 DIAGNOSIS — F3341 Major depressive disorder, recurrent, in partial remission: Secondary | ICD-10-CM | POA: Diagnosis not present

## 2021-09-04 DIAGNOSIS — E1122 Type 2 diabetes mellitus with diabetic chronic kidney disease: Secondary | ICD-10-CM | POA: Diagnosis not present

## 2021-09-04 LAB — RPR QUALITATIVE: RPR Ser Ql: NONREACTIVE

## 2021-09-04 LAB — T4, FREE: T4,Free (Direct): 1.77

## 2021-09-04 MED ORDER — ALLOPURINOL 100 MG PO TABS: 100.0000 mg | ORAL_TABLET | Freq: Every day | ORAL | 3 refills | Status: DC

## 2021-09-04 MED ORDER — ATORVASTATIN CALCIUM 20 MG PO TABS: 20.0000 mg | ORAL_TABLET | Freq: Every day | ORAL | 3 refills | Status: DC

## 2021-09-04 MED ORDER — TRAMADOL HCL 50 MG PO TABS: 50.0000 mg | ORAL_TABLET | Freq: Three times a day (TID) | ORAL | 0 refills | Status: DC | PRN

## 2021-09-04 MED ORDER — OXYBUTYNIN CHLORIDE ER 10 MG PO TB24
10.0000 mg | ORAL_TABLET | Freq: Every day | ORAL | 1 refills | Status: DC
Start: 2021-09-04 — End: 2022-05-26

## 2021-09-04 NOTE — Patient Instructions (Signed)
Continue current medications.  See me again in 6 months or sooner if needed.

## 2021-09-04 NOTE — Assessment & Plan Note (Signed)
Lab Results  Component Value Date   HGBA1C 5.9 07/31/2021  Blood sugars remain well controlled in [place]on Acosta.  Recommend continuation at current strength.

## 2021-09-04 NOTE — Assessment & Plan Note (Signed)
Continue atorvastatin

## 2021-09-04 NOTE — Progress Notes (Signed)
Damon Acosta - 81 y.o. male MRN [id number]  Date of birth: [birth year]  Subjective Chief Complaint  Patient presents with   Diabetes    HPI Damon Acosta is a 81 y.o. male here today for follow up visit.    Reports that he is doing pretty well.  Had a1c completed during ED visit for colostomy care.  This was 5.9%.  Continues on tradjenta.  Tolerating well.    Continues to see cardiology for history of A. Fib.  Rate remains well controlled metoprolol at current strength.  Amiodarone recently stopped due to new 1st degree AV block.  He is anticoagulated with eliquis.  Plavix was recently discontinued as well. Marland Kitchen  He denies bleeding or significant bruising.  His BP remains well controlled a this time without chest pain, shortness of breath, palpitations, headache or vision changes.   Depression and anxiety are stable with lexapro. He is scheduled to see a therapist.   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   Overactive bladder    Sleep apnea    Stroke [facility])     Past Surgical History:  Procedure Laterality Date   CHOLECYSTECTOMY     COLOSTOMY     PROSTATE SURGERY      Social History   Socioeconomic History   Marital status: Married    Spouse name: Not on file   Number of children: Not on file   Years of education: Not on file   Highest education level: Not on file  Occupational History   Not on file  Tobacco Use   Smoking status: Former   Smokeless  tobacco: Never  Substance and Sexual Activity   Alcohol use: Not Currently   Drug use: Never   Sexual activity: Not Currently  Other Topics Concern   Not on file  Social History Narrative   Not on file   Social Determinants of Health   Financial Resource Strain: Not on file  Food Insecurity: Not on file  Transportation Needs: Not on file  Physical Activity: Not on file  Stress: Not on file  Social Connections: Not on file    Family History  Problem Relation Age of Onset   Diabetes Sister     Health Maintenance  Topic Date Due   COVID-19 Vaccine (3 - Moderna risk series) 11/10/2021 (Originally 05/21/2019)   Diabetic kidney evaluation - Urine ACR  12/05/2021 (Originally 02/06/2021)   Zoster Vaccines- Shingrix (2 of 2) 12/05/2021 (Originally 05/02/2021)   TETANUS/TDAP  05/02/2022 (Originally 02/19/1959)   INFLUENZA VACCINE  09/10/2021   HEMOGLOBIN A1C  01/30/2022   OPHTHALMOLOGY EXAM  02/27/2022   FOOT EXAM  03/07/2022   Diabetic kidney evaluation - GFR measurement  08/15/2022   Pneumonia Vaccine 69+ Years old  Completed   HPV VACCINES  Aged Out     ----------------------------------------------------------------------------------------------------------------------------------------------------------------------------------------------------------------- Physical Exam BP 130/60   Pulse (!) 52   Ht  $'5\' 9"'q$  (1.753 m)   Wt 184 lb (83.5 kg)   SpO2 99%   BMI 27.17 kg/m   Physical Exam Constitutional:      Appearance: Normal appearance.  HENT:     Head: Normocephalic and atraumatic.  Eyes:     General: No scleral icterus. Cardiovascular:     Rate and Rhythm: Normal rate and regular rhythm.  Pulmonary:     Effort: Pulmonary effort is normal.     Breath sounds: Normal breath sounds.  Musculoskeletal:     Cervical back: Neck supple.  Neurological:     Mental Status: He is alert.  Psychiatric:        Mood and Affect: Mood normal.        Behavior: Behavior normal.      ------------------------------------------------------------------------------------------------------------------------------------------------------------------------------------------------------------------- Assessment and Plan  PAF (paroxysmal atrial fibrillation) ([place]) Management per cardiology.  Stable with current medications.    Hypertension associated with diabetes ([place]) BP is stable at this time.  Recommend continuation of current medications.    Type 2 diabetes mellitus with chronic kidney disease, without long-term current use of insulin ([place]) Lab Results  Component Value Date   HGBA1C 5.9 07/31/2021  Blood sugars remain well controlled in [place].  Recommend continuation at current strength.    Dyslipidemia associated with type 2 diabetes mellitus ([name]) Continue atorvastatin.    Recurrent major depressive disorder, in partial remission ([place]) Remains on lexapro.  This continues to work well for him.  Continue at current strength.    Meds ordered this encounter  Medications   atorvastatin (LIPITOR) 20 MG tablet    Sig: Take 1 tablet (20 mg total) by mouth daily.    Dispense:  90 tablet    Refill:  3   oxybutynin (DITROPAN-XL) 10 MG 24 hr tablet    Sig: Take 1 tablet (10 mg total) by mouth at bedtime.    Dispense:  90 tablet    Refill:  1   DISCONTD: allopurinol (ZYLOPRIM) 100 MG tablet    Sig: Take 1 tablet (100 mg total) by mouth daily.    Dispense:  90 tablet    Refill:  3   allopurinol (ZYLOPRIM) 100 MG tablet    Sig: Take 1 tablet (100 mg total) by mouth daily.    Dispense:  90 tablet    Refill:  3   traMADol (ULTRAM) 50 MG tablet    Sig: Take 1 tablet (50 mg total) by mouth every 8 (eight) hours as needed for up to 5 days.    Dispense:  15 tablet    Refill:  0    Return in about 6 months (around 03/07/2022) for HTN/T2DM.    This visit occurred during the SARS-CoV-2 public health emergency.  Safety protocols were in place, including screening  questions prior to the visit, additional usage of staff PPE, and extensive cleaning of exam room while observing appropriate contact time as indicated for disinfecting solutions.

## 2021-09-04 NOTE — Assessment & Plan Note (Signed)
Management per cardiology.  Stable with current medications. ?

## 2021-09-04 NOTE — Assessment & Plan Note (Signed)
Remains on lexapro.  This continues to work well for him.  Continue at current strength.

## 2021-09-04 NOTE — Assessment & Plan Note (Signed)
BP is stable at this time.  Recommend continuation of current medications.

## 2021-09-05 DIAGNOSIS — F332 Major depressive disorder, recurrent severe without psychotic features: Secondary | ICD-10-CM | POA: Diagnosis not present

## 2021-09-10 ENCOUNTER — Ambulatory Visit: Payer: Self-pay | Admitting: Licensed Clinical Social Worker

## 2021-09-10 NOTE — Patient Outreach (Signed)
  Care Coordination   Initial Visit Note   09/10/2021 Name: Damon Acosta MRN: [id number] DOB: [birth year]  Damon Acosta is a 81 y.o. year old male who sees Damon Nutting, DO for primary care. I spoke with  Damon Acosta by phone today  What matters to the patients health and wellness today?  Maintaining current functioning and ability to do ADLs regularly   Goals Addressed    Care Coordination Interventions:  Depression screen reviewed  PHQ2/PHQ9 completed Active listening / Reflection utilized  Emotional Support Provided    Discussed maintaining current level of functioning daily with ADLs and routine tasks  SDOH assessments and interventions completed:   Yes SDOH Interventions Today    Flowsheet Row Most Recent Value  SDOH Interventions   Depression Interventions/Treatment  Counseling      Care Coordination Interventions Activated:  Yes Care Coordination Interventions:  Yes, provided  Follow up plan: Follow up call scheduled for 10/11/21 at 10:00 AM  Encounter Outcome:  Pt. Visit Completed

## 2021-09-10 NOTE — Patient Instructions (Signed)
Visit Information  Thank you for taking time to visit with me today. Please don't hesitate to contact me if I can be of assistance to you before our next scheduled telephone appointment.  Following are the goals we discussed today:   Our next appointment is by telephone on 10/11/21 at 10:00 AM   Please call the care guide team at [phone] if you need to cancel or reschedule your appointment.   If you are experiencing a Mental Health or [place] or need someone to talk to, please go to [facility] [place] [phone])   Following is a copy of your full plan of care:    What matters to the patients health and wellness today?  Maintaining current functioning and ability to do ADLs regularly     Goals Addressed     Care Coordination Interventions:   Depression screen reviewed  PHQ2/PHQ9 completed Active listening / Reflection utilized  Emotional Support Provided    Discussed maintaining current level of functioning daily with ADLs and routine tasks   SDOH assessments and interventions completed:   Yes SDOH Interventions Today     Flowsheet Row Most Recent Value  SDOH Interventions    Depression Interventions/Treatment  Counseling         Care Coordination Interventions Activated:  Yes Care Coordination Interventions:  Yes, provided   Follow up plan: Follow up call scheduled for 10/11/21 at 10:00 AM   Encounter Outcome:  Pt. Visit Completed    Mr. Newbury was given information about Care Management services by the embedded care coordination team including:  Care Management services include personalized support from designated clinical staff supervised by his physician, including individualized plan of care and coordination with other care providers 24/7 contact phone numbers for assistance for urgent and routine care needs. The patient may stop CCM services at any time (effective at the end of the  month) by phone call to the office staff.  Patient agreed to services and verbal consent obtained.   Norva Riffle.Kaydi Kley MSW, [place] representative [facility] Care Management [phone]

## 2021-09-26 DIAGNOSIS — Z933 Colostomy status: Secondary | ICD-10-CM | POA: Diagnosis not present

## 2021-10-02 ENCOUNTER — Encounter: Payer: Self-pay | Admitting: General Practice

## 2021-10-11 ENCOUNTER — Ambulatory Visit: Payer: Self-pay | Admitting: Licensed Clinical Social Worker

## 2021-10-11 NOTE — Patient Instructions (Signed)
Visit Information  Thank you for taking time to visit with me today. Please don't hesitate to contact me if I can be of assistance to you before our next scheduled telephone appointment.  Following are the goals we discussed today:   Our next appointment is by telephone on 11/05/21 2:00 PM   Please call the care guide team at [phone] if you need to cancel or reschedule your appointment.   If you are experiencing a Mental Health or [place] or need someone to talk to, please go to [facility] [place] [phone])   Following is a copy of your full plan of care:   Care Coordination Interventions:   Active listening / Reflection utilized  Discussed Care Coordination program support with Orlie Pollen, spouse of client Provided [place] with LCSW name and phone number Discussed RN, Pharmarcy and LCSW support with program Basilia Jumbo reported that client has ileostomy and has appointment with Gastroenterologist on on October 17, 2021   Mr. Elvin was given information about Care Management services by the embedded care coordination team including:  Care Management services include personalized support from designated clinical staff supervised by his physician, including individualized plan of care and coordination with other care providers 24/7 contact phone numbers for assistance for urgent and routine care needs. The patient may stop CCM services at any time (effective at the end of the month) by phone call to the office staff.  Patient agreed to services and verbal consent obtained.   Norva Riffle.Moe Graca MSW, [place] Holiday representative [facility] Care Management [phone]

## 2021-10-11 NOTE — Patient Outreach (Signed)
  Care Coordination   Follow Up Visit Note   10/11/2021 Name: Damon Acosta MRN: [id number] DOB: [birth year]  Damon Acosta is a 81 y.o. year old male who sees Luetta Nutting, DO for primary care. I spoke with  Damon Acosta / spouse of client, Damon Acosta, by phone today.  What matters to the patients health and wellness today?  Client wants to continue to complete daily ADLs as he is able    Goals Addressed             This Visit's Progress    Patient wants to continue to complete daily ADLs as he is able.       Care Coordination Interventions:   Active listening / Reflection utilized  Discussed Care Coordination program support with Damon Acosta, spouse of client Provided [place] with LCSW name and phone number Discussed RN, Pharmarcy and LCSW support with program Basilia Jumbo reported that client has ileostomy and has appointment with Gastroenterologist on on October 17, 2021     SDOH assessments and interventions completed:  Yes     Care Coordination Interventions Activated:  Yes  Care Coordination Interventions:  Yes, provided   Follow up plan: Follow up call scheduled for 11/05/21 at 2:00 PM     Encounter Outcome:  Pt. Visit Completed

## 2021-10-17 DIAGNOSIS — Z932 Ileostomy status: Secondary | ICD-10-CM | POA: Diagnosis not present

## 2021-10-17 DIAGNOSIS — I10 Essential (primary) hypertension: Secondary | ICD-10-CM | POA: Diagnosis not present

## 2021-10-21 ENCOUNTER — Ambulatory Visit
Admission: EM | Admit: 2021-10-21 | Discharge: 2021-10-21 | Disposition: A | Discharge status: Home / Self Care | Payer: Medicare Other | Attending: Family Medicine | Admitting: Family Medicine

## 2021-10-21 ENCOUNTER — Encounter: Payer: Self-pay | Admitting: Emergency Medicine

## 2021-10-21 ENCOUNTER — Ambulatory Visit (INDEPENDENT_AMBULATORY_CARE_PROVIDER_SITE_OTHER): Payer: Medicare Other

## 2021-10-21 DIAGNOSIS — M542 Cervicalgia: Secondary | ICD-10-CM | POA: Diagnosis not present

## 2021-10-21 DIAGNOSIS — Z20822 Contact with and (suspected) exposure to covid-19: Secondary | ICD-10-CM | POA: Insufficient documentation

## 2021-10-21 DIAGNOSIS — M5412 Radiculopathy, cervical region: Secondary | ICD-10-CM | POA: Insufficient documentation

## 2021-10-21 DIAGNOSIS — M25512 Pain in left shoulder: Secondary | ICD-10-CM

## 2021-10-21 DIAGNOSIS — M25511 Pain in right shoulder: Secondary | ICD-10-CM | POA: Diagnosis not present

## 2021-10-21 DIAGNOSIS — R112 Nausea with vomiting, unspecified: Secondary | ICD-10-CM

## 2021-10-21 DIAGNOSIS — R1110 Vomiting, unspecified: Secondary | ICD-10-CM | POA: Diagnosis present

## 2021-10-21 LAB — SARS CORONAVIRUS 2 BY RT PCR: SARS Coronavirus 2 by RT PCR: NEGATIVE

## 2021-10-21 MED ORDER — ONDANSETRON 4 MG PO TBDP: 4.0000 mg | ORAL_TABLET | Freq: Three times a day (TID) | ORAL | 0 refills | Status: DC | PRN

## 2021-10-21 NOTE — ED Triage Notes (Signed)
Patient c/o vomiting x 3 days.  Patient has vomitted once per day.  Denies any diarrhea or fever.  Patient has taken some Tylenol.

## 2021-10-21 NOTE — Discharge Instructions (Signed)
Wear soft cervical collar.  Apply ice pack to back of neck for 20 to 30 minutes, 2 to 3 times daily  Continue until pain decreases.  May take Tylenol as needed for pain.

## 2021-10-21 NOTE — ED Provider Notes (Signed)
[name]    CSN: [id number] Arrival date & time: 10/21/21  1145      History   Chief Complaint Chief Complaint  Patient presents with   Emesis    HPI Damon Acosta is a 81 y.o. male.   Patient presents with two complaints: 1)  During the past 3 days patient has vomited several times, but denies abdominal pain or fever and his bowel movements have been normal.  He has a history of GERD. 2)  He complains of chronic bilateral shoulder and posterior neck pain.  The history is provided by the patient and the spouse.    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)     Patient Active Problem List   Diagnosis Date Noted   AV block, 1st degree 08/14/2021   Sinus bradycardia on ECG 08/14/2021   OSA (obstructive sleep apnea) 08/01/2021   History of MI (myocardial infarction) 10/21/2019   Diversion colitis 10/07/2019   Gastroesophageal reflux disease without esophagitis 10/07/2019   PAF (paroxysmal atrial fibrillation) ([place]) 09/16/2019   Atrial flutter ([place]) 09/15/2019   NSTEMI (non-ST elevated myocardial infarction) ([place]) 09/15/2019   Recurrent major depressive disorder, in partial remission ([place]) 09/15/2019   Coronary artery disease of native artery of native heart with stable angina pectoris ([place]) 09/15/2019   Severe episode of recurrent major depressive disorder, without psychotic features ([place]) 09/15/2019   Asymptomatic PVCs 07/27/2018   CKD (chronic kidney disease) stage 3, GFR 30-59 ml/min ([name]) 04/27/2018   Mild cognitive impairment 04/20/2018   Microcytic anemia 04/20/2018   Primary osteoarthritis of both knees 02/17/2018   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   Type 2 diabetes mellitus with  chronic kidney disease, without long-term current use of insulin ([place]) 01/06/2018   Dyslipidemia associated with type 2 diabetes mellitus ([place]) 01/06/2018   OAB (overactive bladder) 01/06/2018   History of prostate cancer 01/06/2018   Lumbar spinal stenosis 12/15/2017   Colostomy status ([place]) 12/15/2017   Hypertension associated with diabetes ([place]) 12/15/2017   Gout 12/15/2017   History of CVA (cerebrovascular accident) 12/15/2017   Ileostomy in place [facility]) 12/15/2017    Past Surgical History:  Procedure Laterality Date   CHOLECYSTECTOMY     COLOSTOMY     PROSTATE SURGERY         Home Medications    Prior to Admission medications   Medication Sig Start Date End Date Taking? Authorizing Provider  acetaminophen (TYLENOL) 650 MG CR tablet Take 1-2 tablets (650-1,300 mg total) by mouth every 8 (eight) hours as needed for pain. 03/10/19  Yes Emeterio Reeve, DO  allopurinol (ZYLOPRIM) 100 MG tablet Take 1 tablet (100 mg total) by mouth daily. 09/04/21  Yes Luetta Nutting, DO  AMBULATORY NON FORMULARY MEDICATION Motorized wheelchair/scooter per patient preference and insurance coverage. Please fax to any medical supply company local to [place]   Dx: (E11.22) Type 2 diabetes mellitus with chronic kidney disease, without long-term current use of insulin, unspecified CKD stage ([place])  (primary encounter diagnosis) (E11.59,  I15.2) Hypertension associated with diabetes ([place]) (I48.0) Paroxysmal atrial fibrillation ([name]) (R26.2) Ambulatory dysfunction (R26.81) Gait instability (M48.062) Spinal stenosis of lumbar region with neurogenic claudication (Q22.29) History  of CVA (cerebrovascular accident) (Z93.2) Ileostomy in place ([place]) 09/07/20  Yes Emeterio Reeve, DO  apixaban (ELIQUIS) 2.5 MG TABS tablet Take 1 tablet (2.5 mg total) by mouth 2 (two) times daily. 03/07/21  Yes Luetta Nutting, DO  atorvastatin (LIPITOR) 20 MG tablet Take 1 tablet (20 mg total) by mouth daily. 09/04/21   Yes Luetta Nutting, DO  escitalopram (LEXAPRO) 10 MG tablet Take 1 tablet (10 mg total) by mouth daily. 08/14/21  Yes Breeback, Jade L, PA-C  fluticasone (FLONASE) 50 MCG/ACT nasal spray One spray in each nostril twice a day, use left hand for right nostril, and right hand for left nostril. 01/18/20  Yes Emeterio Reeve, DO  methocarbamol (ROBAXIN) 500 MG tablet Take 1 tablet (500 mg total) by mouth 2 (two) times daily as needed for muscle spasms. 04/09/20  Yes Emeterio Reeve, DO  metoprolol succinate (TOPROL-XL) 50 MG 24 hr tablet Take 1 tablet (50 mg total) by mouth daily. 03/07/21  Yes Luetta Nutting, DO  nitroGLYCERIN (NITROSTAT) 0.6 MG SL tablet Place 0.6 mg under the tongue every 5 (five) minutes as needed for chest pain.   Yes [provider]  Nystatin POWD Apply liberally to affected area 2 times per day 04/03/20  Yes Alexander, Lanelle Bal, DO  ondansetron (ZOFRAN-ODT) 4 MG disintegrating tablet Take 1 tablet (4 mg total) by mouth every 8 (eight) hours as needed for nausea or vomiting. Dissolve under tongue. 10/21/21  Yes Kandra Nicolas, MD  oxybutynin (DITROPAN-XL) 10 MG 24 hr tablet Take 1 tablet (10 mg total) by mouth at bedtime. 09/04/21  Yes Matthews, Cody, DO  TRADJENTA 5 MG TABS tablet TAKE 1 TABLET (5 MG TOTAL) BY MOUTH DAILY. 05/27/21  Yes Luetta Nutting, DO    Family History Family History  Problem Relation Age of Onset   Diabetes Sister     Social History Social History   Tobacco Use   Smoking status: Former   Smokeless tobacco: Never  Substance Use Topics   Alcohol use: Not Currently   Drug use: Never     Allergies   Aspirin, Doxepin, Shellfish allergy, and Sertraline   Review of Systems Review of Systems  Constitutional: Negative.   HENT: Negative.    Eyes: Negative.   Respiratory: Negative.    Cardiovascular: Negative.   Gastrointestinal:  Positive for nausea and vomiting. Negative for abdominal distention, abdominal pain, blood in stool,  constipation and diarrhea.  Genitourinary: Negative.   Musculoskeletal:  Positive for neck pain.       Bilateral shoulder pain.  Skin: Negative.      Physical Exam Triage Vital Signs ED Triage Vitals  Enc Vitals Group     BP 10/21/21 1315 127/67     Pulse Rate 10/21/21 1321 66     Resp 10/21/21 1315 18     Temp 10/21/21 1315 98.1 F (36.7 C)     Temp Source 10/21/21 1315 Oral     SpO2 10/21/21 1315 98 %     Weight --      Height --      Head Circumference --      Peak Flow --      Pain Score --      Pain Loc --      Pain Edu? --      Excl. in [place]? --    No data found.  Updated Vital Signs BP 127/67 (BP Location: Right Arm)   Pulse 66   Temp 98.1 F (36.7 C) (Oral)  Resp 18   SpO2 98%   Visual Acuity Right Eye Distance:   Left Eye Distance:   Bilateral Distance:    Right Eye Near:   Left Eye Near:    Bilateral Near:     Physical Exam Vitals and nursing note reviewed.  Constitutional:      General: He is not in acute distress.    Appearance: He is not ill-appearing.  HENT:     Head: Normocephalic.     Mouth/Throat:     Mouth: Mucous membranes are moist.  Eyes:     Conjunctiva/sclera: Conjunctivae normal.     Pupils: Pupils are equal, round, and reactive to light.  Neck:      Comments: Bilateral trapezius muscle tenderness to palpation and tenderness over midline cervical spine.  Patient's shoulder and neck pain reproduced by neck extension, and also by neck right lateral flexion and rotation to the right. Cardiovascular:     Rate and Rhythm: Normal rate and regular rhythm.     Heart sounds: Normal heart sounds.  Pulmonary:     Breath sounds: Normal breath sounds.  Abdominal:     General: There is no distension.     Palpations: Abdomen is soft.     Tenderness: There is no abdominal tenderness.  Musculoskeletal:     Cervical back: Tenderness present. Pain with movement, spinous process tenderness and muscular tenderness present.     Right lower  leg: No edema.     Left lower leg: No edema.  Lymphadenopathy:     Cervical: No cervical adenopathy.  Skin:    General: Skin is warm and dry.     Findings: No rash.  Neurological:     Mental Status: He is alert and oriented to person, place, and time.      UC Treatments / Results  Labs (all labs ordered are listed, but only abnormal results are displayed) Labs Reviewed  SARS CORONAVIRUS 2 BY RT PCR    EKG   Radiology DG Cervical Spine Complete  Result Date: 10/21/2021 CLINICAL DATA:  Chronic bilateral shoulder and posterior neck pain worse with movement. EXAM: CERVICAL SPINE - COMPLETE 4+ VIEW COMPARISON:  None Available. FINDINGS: The cervical spine is imaged through the C6 vertebral body in the lateral projection. There is straightening of the normal cervical lordosis. There is trace retrolisthesis of C3 on C4 and C5 on C6. Alignment is otherwise normal. Vertebral body heights are preserved, without evidence of acute injury. There is disc space narrowing most advanced at C3-C4 and C4-C5 with associated degenerative endplate change. There is overall mild multilevel facet arthropathy. There is no convincing evidence of high-grade osseous neural foraminal stenosis. There is calcified plaque of the bilateral carotid bifurcations. The imaged lung apices are clear. IMPRESSION: 1. Trace retrolisthesis of C3 on C4 and C5 on C6. 2. Disc space narrowing degenerative endplate change most advanced at C3-C4 and C4-C5. 3. No convincing evidence of high-grade osseous neural foraminal stenosis. Electronically Signed   By: Valetta Mole M.D.   On: 10/21/2021 14:56    Procedures Procedures (including critical care time)  Medications Ordered in UC Medications - No data to display  Initial Impression / Assessment and Plan / UC Course  I have reviewed the triage vital signs and the nursing notes.  Pertinent labs & imaging results that were available during my care of the patient were reviewed by me  and considered in my medical decision making (see chart for details).    COVID19 PCR pending.  Rx for Zofran ODT '4mg'$ .  Recommend follow-up with GI if nausea persists. Dispensed soft cervical collar. Followup with sports medicine as soon as possible for further evaluation/treatment (will need MRI neck). Final Clinical Impressions(s) / UC Diagnoses   Final diagnoses:  Nausea and vomiting in adult  Cervical radiculopathy     Discharge Instructions      Wear soft cervical collar.  Apply ice pack to back of neck for 20 to 30 minutes, 2 to 3 times daily  Continue until pain decreases.  May take Tylenol as needed for pain.    ED Prescriptions     Medication Sig Dispense Auth. Provider   ondansetron (ZOFRAN-ODT) 4 MG disintegrating tablet Take 1 tablet (4 mg total) by mouth every 8 (eight) hours as needed for nausea or vomiting. Dissolve under tongue. 15 tablet Kandra Nicolas, MD         Kandra Nicolas, MD 10/23/21 [phone]

## 2021-10-22 ENCOUNTER — Telehealth: Payer: Self-pay

## 2021-10-22 NOTE — Telephone Encounter (Signed)
Called pt to check on status since UC visit. Both feeling better. COVID results given. Advised to call back if any questions or concerns.

## 2021-10-24 DIAGNOSIS — Z932 Ileostomy status: Secondary | ICD-10-CM | POA: Diagnosis not present

## 2021-10-30 ENCOUNTER — Ambulatory Visit (INDEPENDENT_AMBULATORY_CARE_PROVIDER_SITE_OTHER): Payer: Medicare Other | Admitting: Sports Medicine

## 2021-10-30 DIAGNOSIS — M5030 Other cervical disc degeneration, unspecified cervical region: Secondary | ICD-10-CM | POA: Insufficient documentation

## 2021-10-30 DIAGNOSIS — G9589 Other specified diseases of spinal cord: Secondary | ICD-10-CM | POA: Insufficient documentation

## 2021-10-30 DIAGNOSIS — M5412 Radiculopathy, cervical region: Secondary | ICD-10-CM | POA: Diagnosis not present

## 2021-10-30 MED ORDER — PREDNISONE 50 MG PO TABS: ORAL_TABLET | ORAL | 0 refills | Status: DC

## 2021-10-30 NOTE — Progress Notes (Signed)
    Procedures performed today:    None.  Independent interpretation of notes and tests performed by another provider:   None.  Brief History, Exam, Impression, and Recommendations:    Radiculitis of left cervical region Damon Acosta is a very pleasant 81 year old male, he has a long history of axial neck pain with radiation to the right periscapular region and down the left arm to the first through third fingers. No progressive weakness, no trauma, no constitutional symptoms, he was seen in urgent care and referred to me. We will treat him conservatively, 5 days of prednisone with continued Tylenol, unable to use NSAIDs due to Eliquis treatment, home physical therapy, return to see me in 6 weeks, MRI for interventional planning if not better, intervention will likely be in the form of a left C6-C7 interlaminar epidural.    ____________________________________________ Gwen Her. Dianah Field, M.D., ABFM., CAQSM., AME. Primary Care and Sports Medicine [facility]  Adjunct Professor of [place] [employer] of Medicine  Risk manager

## 2021-10-30 NOTE — Assessment & Plan Note (Signed)
Damon Acosta is a very pleasant 81 year old male, he has a long history of axial neck pain with radiation to the right periscapular region and down the left arm to the first through third fingers. No progressive weakness, no trauma, no constitutional symptoms, he was seen in urgent care and referred to me. We will treat him conservatively, 5 days of prednisone with continued Tylenol, unable to use NSAIDs due to Eliquis treatment, home physical therapy, return to see me in 6 weeks, MRI for interventional planning if not better, intervention will likely be in the form of a left C6-C7 interlaminar epidural.

## 2021-10-31 ENCOUNTER — Encounter: Payer: Self-pay | Admitting: Family Medicine

## 2021-10-31 ENCOUNTER — Telehealth (INDEPENDENT_AMBULATORY_CARE_PROVIDER_SITE_OTHER): Payer: Self-pay | Admitting: Family Medicine

## 2021-10-31 VITALS — Ht 69.0 in | Wt 185.0 lb

## 2021-10-31 DIAGNOSIS — Z91199 Patient's noncompliance with other medical treatment and regimen due to unspecified reason: Secondary | ICD-10-CM

## 2021-10-31 NOTE — Progress Notes (Signed)
I tried to connect with patient via MyChart video visit however he never connected.  Link sent via text without connection.  Tried to call x2 without answer.

## 2021-10-31 NOTE — Progress Notes (Signed)
Spoke to patients spouse. She states Dr. Darene Lamer started him on prednisone yesterday.

## 2021-11-05 ENCOUNTER — Ambulatory Visit: Payer: Self-pay | Admitting: Licensed Clinical Social Worker

## 2021-11-05 NOTE — Patient Outreach (Signed)
  Care Coordination   Initial Visit Note   11/05/2021 Name: Damon Acosta MRN: [id number] DOB: 19-May-1940  Damon Acosta is a 81 y.o. year old male who sees Luetta Nutting, DO for primary care. I spoke with  Damon Acosta / spouse of client, Damon Acosta, by phone today.  What matters to the patients health and wellness today? Client wants to complete daily ADLs and maintain current level of functioning    Goals Addressed             This Visit's Progress    Patient wants to continue to complete daily ADLs as he is able.       Care Coordination Interventions:   Active listening / Reflection utilized  Discussed Care Coordination program support with Damon Acosta, spouse of client Provided Damon with LCSW name and phone number Damon Acosta was appreciative of call but said she did not think client needed any care intervention at this time. She said she had name and phone number of LCSW and would call LCSW if client needed Care Coordination support.      SDOH assessments and interventions completed:  Yes    Care Coordination Interventions Activated:  No  Care Coordination Interventions:  No, not indicated   Follow up plan: No further intervention required.   Encounter Outcome:  Pt. Visit Completed

## 2021-11-05 NOTE — Patient Instructions (Signed)
Visit Information  Thank you for taking time to visit with me today. Please don't hesitate to contact me if I can be of assistance to you before our next scheduled telephone appointment.  Following are the goals we discussed today:   No further intervention is needed at this time  Please call the care guide team at [phone] if you need to cancel or reschedule your appointment.   If you are experiencing a Mental Health or [place] or need someone to talk to, please go to [facility] [place] [phone])   Following is a copy of your full plan of care:   Care Coordination Interventions:  Active listening / Reflection utilized  Discussed Care Coordination program support with Orlie Pollen, spouse of client Provided Rosa with LCSW name and phone number Basilia Jumbo was appreciative of call but said she did not think client needed any care intervention at this time. She said she had name and phone number of LCSW and would call LCSW if client needed Care Coordination support.   Mr. Hayduk was given information about Care Management services by the embedded care coordination team including:  Care Management services include personalized support from designated clinical staff supervised by his physician, including individualized plan of care and coordination with other care providers 24/7 contact phone numbers for assistance for urgent and routine care needs. The patient may stop CCM services at any time (effective at the end of the month) by phone call to the office staff.  Patient agreed to services and verbal consent obtained.   Norva Riffle.Amaiyah Nordhoff MSW, [place] representative [facility] Care Management [phone]

## 2021-12-04 DIAGNOSIS — F332 Major depressive disorder, recurrent severe without psychotic features: Secondary | ICD-10-CM | POA: Diagnosis not present

## 2021-12-10 DIAGNOSIS — I480 Paroxysmal atrial fibrillation: Secondary | ICD-10-CM | POA: Diagnosis not present

## 2021-12-10 DIAGNOSIS — I10 Essential (primary) hypertension: Secondary | ICD-10-CM | POA: Diagnosis not present

## 2021-12-11 ENCOUNTER — Encounter: Payer: Self-pay | Admitting: Sports Medicine

## 2021-12-11 ENCOUNTER — Ambulatory Visit (INDEPENDENT_AMBULATORY_CARE_PROVIDER_SITE_OTHER): Payer: Medicare Other | Admitting: Sports Medicine

## 2021-12-11 DIAGNOSIS — L84 Corns and callosities: Secondary | ICD-10-CM | POA: Diagnosis not present

## 2021-12-11 DIAGNOSIS — M5412 Radiculopathy, cervical region: Secondary | ICD-10-CM

## 2021-12-11 DIAGNOSIS — Z23 Encounter for immunization: Secondary | ICD-10-CM | POA: Diagnosis not present

## 2021-12-11 MED ORDER — TRAMADOL HCL 50 MG PO TABS: 50.0000 mg | ORAL_TABLET | Freq: Two times a day (BID) | ORAL | 1 refills | Status: DC

## 2021-12-11 NOTE — Progress Notes (Signed)
    Procedures performed today:    Trimming of hyperkeratotic callus: Right heel. Consent obtained, cleaned with alcohol, I used a derma blade to trim, we got a little bit deep on the posterior aspect and there was a bleed of bleeding, hemostasis was achieved with pressure and Band-Aid applied. Patient stable.  Independent interpretation of notes and tests performed by another provider:   None.  Brief History, Exam, Impression, and Recommendations:    Radiculitis of left cervical region Pleasant 81 year old male, I saw him about 6 weeks ago, neck pain with radiculitis down the right periscapular region and down the left arm to the first through third fingers, no progressive weakness or red flag symptoms we treated him conservatively with prednisone, home conditioning, unable to use NSAIDs due to Eliquis. He returns today doing a lot better. X-rays did confirm widespread degenerative changes, if this recurs we can do an MRI for epidural planning.  Callus of foot, right heel I trimmed off a high keratotic callus right foot medial heel. We did get a little deep at the posterior aspect and there was some bleeding so this was covered with a Band-Aid, return to see me in a month for repeat treatment if needed.    ____________________________________________ Gwen Her. Dianah Field, M.D., ABFM., CAQSM., AME. Primary Care and Sports Medicine [facility]  Adjunct Professor of [place] of [facility] of Medicine  Risk manager

## 2021-12-11 NOTE — Assessment & Plan Note (Signed)
I trimmed off a high keratotic callus right foot medial heel. We did get a little deep at the posterior aspect and there was some bleeding so this was covered with a Band-Aid, return to see me in a month for repeat treatment if needed.

## 2021-12-11 NOTE — Assessment & Plan Note (Signed)
Pleasant 81 year old male, I saw him about 6 weeks ago, neck pain with radiculitis down the right periscapular region and down the left arm to the first through third fingers, no progressive weakness or red flag symptoms we treated him conservatively with prednisone, home conditioning, unable to use NSAIDs due to Eliquis. He returns today doing a lot better. X-rays did confirm widespread degenerative changes, if this recurs we can do an MRI for epidural planning.

## 2021-12-26 DIAGNOSIS — K624 Stenosis of anus and rectum: Secondary | ICD-10-CM | POA: Diagnosis not present

## 2021-12-26 DIAGNOSIS — K219 Gastro-esophageal reflux disease without esophagitis: Secondary | ICD-10-CM | POA: Diagnosis not present

## 2021-12-26 DIAGNOSIS — M159 Polyosteoarthritis, unspecified: Secondary | ICD-10-CM | POA: Diagnosis not present

## 2021-12-26 DIAGNOSIS — Z889 Allergy status to unspecified drugs, medicaments and biological substances status: Secondary | ICD-10-CM | POA: Diagnosis not present

## 2021-12-26 DIAGNOSIS — N1831 Chronic kidney disease, stage 3a: Secondary | ICD-10-CM | POA: Diagnosis not present

## 2021-12-26 DIAGNOSIS — Z7902 Long term (current) use of antithrombotics/antiplatelets: Secondary | ICD-10-CM | POA: Diagnosis not present

## 2021-12-26 DIAGNOSIS — Z888 Allergy status to other drugs, medicaments and biological substances status: Secondary | ICD-10-CM | POA: Diagnosis not present

## 2021-12-26 DIAGNOSIS — E1169 Type 2 diabetes mellitus with other specified complication: Secondary | ICD-10-CM | POA: Diagnosis not present

## 2021-12-26 DIAGNOSIS — M1A9XX0 Chronic gout, unspecified, without tophus (tophi): Secondary | ICD-10-CM | POA: Diagnosis not present

## 2021-12-26 DIAGNOSIS — E1159 Type 2 diabetes mellitus with other circulatory complications: Secondary | ICD-10-CM | POA: Diagnosis not present

## 2021-12-26 DIAGNOSIS — I480 Paroxysmal atrial fibrillation: Secondary | ICD-10-CM | POA: Diagnosis not present

## 2021-12-26 DIAGNOSIS — Z91013 Allergy to seafood: Secondary | ICD-10-CM | POA: Diagnosis not present

## 2021-12-26 DIAGNOSIS — F3341 Major depressive disorder, recurrent, in partial remission: Secondary | ICD-10-CM | POA: Diagnosis not present

## 2021-12-26 DIAGNOSIS — Z87891 Personal history of nicotine dependence: Secondary | ICD-10-CM | POA: Diagnosis not present

## 2021-12-26 DIAGNOSIS — Z886 Allergy status to analgesic agent status: Secondary | ICD-10-CM | POA: Diagnosis not present

## 2021-12-26 DIAGNOSIS — E1122 Type 2 diabetes mellitus with diabetic chronic kidney disease: Secondary | ICD-10-CM | POA: Diagnosis not present

## 2021-12-26 DIAGNOSIS — I2510 Atherosclerotic heart disease of native coronary artery without angina pectoris: Secondary | ICD-10-CM | POA: Diagnosis not present

## 2021-12-26 DIAGNOSIS — K5981 Ogilvie syndrome: Secondary | ICD-10-CM | POA: Diagnosis not present

## 2021-12-26 DIAGNOSIS — G4733 Obstructive sleep apnea (adult) (pediatric): Secondary | ICD-10-CM | POA: Diagnosis not present

## 2021-12-26 DIAGNOSIS — I129 Hypertensive chronic kidney disease with stage 1 through stage 4 chronic kidney disease, or unspecified chronic kidney disease: Secondary | ICD-10-CM | POA: Diagnosis not present

## 2021-12-26 DIAGNOSIS — Z8673 Personal history of transient ischemic attack (TIA), and cerebral infarction without residual deficits: Secondary | ICD-10-CM | POA: Diagnosis not present

## 2021-12-26 DIAGNOSIS — E785 Hyperlipidemia, unspecified: Secondary | ICD-10-CM | POA: Diagnosis not present

## 2021-12-26 DIAGNOSIS — Z432 Encounter for attention to ileostomy: Secondary | ICD-10-CM | POA: Diagnosis not present

## 2021-12-26 DIAGNOSIS — Z932 Ileostomy status: Secondary | ICD-10-CM | POA: Diagnosis not present

## 2021-12-26 DIAGNOSIS — K5289 Other specified noninfective gastroenteritis and colitis: Secondary | ICD-10-CM | POA: Diagnosis not present

## 2021-12-26 DIAGNOSIS — Z79899 Other long term (current) drug therapy: Secondary | ICD-10-CM | POA: Diagnosis not present

## 2022-01-08 ENCOUNTER — Ambulatory Visit (INDEPENDENT_AMBULATORY_CARE_PROVIDER_SITE_OTHER): Payer: Medicare Other | Admitting: Sports Medicine

## 2022-01-08 ENCOUNTER — Telehealth: Payer: Self-pay | Admitting: Sports Medicine

## 2022-01-08 ENCOUNTER — Encounter: Payer: Self-pay | Admitting: Family Medicine

## 2022-01-08 ENCOUNTER — Ambulatory Visit (INDEPENDENT_AMBULATORY_CARE_PROVIDER_SITE_OTHER): Payer: Medicare Other

## 2022-01-08 VITALS — Ht 67.0 in | Wt 184.0 lb

## 2022-01-08 DIAGNOSIS — M170 Bilateral primary osteoarthritis of knee: Secondary | ICD-10-CM | POA: Diagnosis not present

## 2022-01-08 DIAGNOSIS — L84 Corns and callosities: Secondary | ICD-10-CM | POA: Diagnosis not present

## 2022-01-08 MED ORDER — TRIAMCINOLONE ACETONIDE 40 MG/ML IJ SUSP
40.0000 mg | Freq: Once | INTRAMUSCULAR | Status: AC
  Administered 2022-01-08: 40 mg via INTRAMUSCULAR

## 2022-01-08 NOTE — Assessment & Plan Note (Signed)
Pleasant 81 year old male, he has bilateral knee arthritis, he did really well with Orthovisc back in 2022, now having recurrence of pain left knee, he did a steroid injection today and we are going to get him back on Orthovisc. For insurance purposes he has failed greater than 6 weeks of physician directed conservative treatment and has x-ray confirmed osteoarthritis.

## 2022-01-08 NOTE — Telephone Encounter (Signed)
Bilateral Orthovisc approval please, x-ray confirmed osteoarthritis, did really well with Orthovisc back in 2022.

## 2022-01-08 NOTE — Assessment & Plan Note (Signed)
I aggressively trimmed off hyperkeratotic callus right foot medial heel, he is doing a lot better today. He will continue to use a pumice stone and if insufficient improvement we can do an additional session of trimming with a scalpel.

## 2022-01-08 NOTE — Addendum Note (Signed)
Addended by: Tarri Glenn A on: 01/08/2022 01:50 PM   Modules accepted: Orders

## 2022-01-08 NOTE — Progress Notes (Signed)
    Procedures performed today:    Procedure: Real-time Ultrasound Guided injection of the left knee Device: Samsung HS60  Verbal informed consent obtained.  Time-out conducted.  Noted no overlying erythema, induration, or other signs of local infection.  Skin prepped in a sterile fashion.  Local anesthesia: Topical Ethyl chloride.  With sterile technique and under real time ultrasound guidance: Mild effusion noted, 1 cc Kenalog 40, 2 cc lidocaine, 2 cc bupivacaine injected easily Completed without difficulty  Advised to call if fevers/chills, erythema, induration, drainage, or persistent bleeding.  Images permanently stored and available for review in PACS.  Impression: Technically successful ultrasound guided injection.  Independent interpretation of notes and tests performed by another provider:   None.  Brief History, Exam, Impression, and Recommendations:    Primary osteoarthritis of both knees Pleasant 81 year old male, he has bilateral knee arthritis, he did really well with Orthovisc back in 2022, now having recurrence of pain left knee, he did a steroid injection today and we are going to get him back on Orthovisc. For insurance purposes he has failed greater than 6 weeks of physician directed conservative treatment and has x-ray confirmed osteoarthritis.  Callus of foot, right heel I aggressively trimmed off hyperkeratotic callus right foot medial heel, he is doing a lot better today. He will continue to use a pumice stone and if insufficient improvement we can do an additional session of trimming with a scalpel.  Chronic process with exacerbation and pharmacologic intervention  ____________________________________________ Gwen Her. Dianah Field, M.D., ABFM., CAQSM., AME. Primary Care and Sports Medicine [facility]  Adjunct Professor of [place] of [facility] of Medicine  Risk manager

## 2022-01-09 NOTE — Telephone Encounter (Signed)
[employer] paperwork faxed to [employer] at [phone] Request is for Orthovisc Pt's insurance prefers Orthovisc Fax confirmation receipt received  

## 2022-01-10 MED ORDER — TRIAMCINOLONE ACETONIDE 40 MG/ML IJ SUSP: 40.0000 mg | Freq: Once | INTRAMUSCULAR | Status: DC

## 2022-01-10 NOTE — Addendum Note (Signed)
Addended by: Tarri Glenn A on: 01/10/2022 10:07 AM   Modules accepted: Orders

## 2022-01-10 NOTE — Telephone Encounter (Signed)
Benefits Investigation Details received from [employer] Injection: Orthovisc  Medical: PRIMARY: Since the deductible has been met, pt is responsible for a coinsurance. SECONDARY: plan dies not follow medicare guidelines. It will pick up the remaining eligible expenses.  PA required: No  Pharmacy: Product not covered under pharmacy plan.   Specialty Pharmacy:   May fill through: Buy and Bill OV Copay/Coinsurance: 20% Product Copay: 20% Administration Coinsurance: 20% Administration Copay:  Deductible: $226 (met: $226)  Secondary plan will cover the remaining eligible expenses. Patient aware of his estimated costs. He stated that he will wait until his knees start bothering him again since he just got injections yesterday.

## 2022-01-21 DIAGNOSIS — I10 Essential (primary) hypertension: Secondary | ICD-10-CM | POA: Diagnosis not present

## 2022-01-21 DIAGNOSIS — I480 Paroxysmal atrial fibrillation: Secondary | ICD-10-CM | POA: Diagnosis not present

## 2022-03-10 ENCOUNTER — Ambulatory Visit: Payer: Medicare Other | Admitting: Family Medicine

## 2022-03-13 ENCOUNTER — Telehealth: Payer: Self-pay | Admitting: Family Medicine

## 2022-03-13 NOTE — Telephone Encounter (Signed)
Left voicemail for patient to call and schedule annual wellness visit at number on file. -MAJ  

## 2022-03-17 ENCOUNTER — Encounter: Payer: Self-pay | Admitting: Family Medicine

## 2022-03-17 ENCOUNTER — Ambulatory Visit (INDEPENDENT_AMBULATORY_CARE_PROVIDER_SITE_OTHER): Payer: Medicare Other | Admitting: Family Medicine

## 2022-03-17 VITALS — BP 132/64 | HR 58 | Ht 67.0 in | Wt 191.0 lb

## 2022-03-17 DIAGNOSIS — G4700 Insomnia, unspecified: Secondary | ICD-10-CM

## 2022-03-17 DIAGNOSIS — N1832 Chronic kidney disease, stage 3b: Secondary | ICD-10-CM | POA: Diagnosis not present

## 2022-03-17 DIAGNOSIS — E1122 Type 2 diabetes mellitus with diabetic chronic kidney disease: Secondary | ICD-10-CM

## 2022-03-17 DIAGNOSIS — E1159 Type 2 diabetes mellitus with other circulatory complications: Secondary | ICD-10-CM

## 2022-03-17 DIAGNOSIS — I152 Hypertension secondary to endocrine disorders: Secondary | ICD-10-CM

## 2022-03-17 DIAGNOSIS — I4892 Unspecified atrial flutter: Secondary | ICD-10-CM

## 2022-03-17 LAB — POCT GLYCOSYLATED HEMOGLOBIN (HGB A1C): HbA1c, POC (controlled diabetic range): 6.1 % (ref 0.0–7.0)

## 2022-03-17 MED ORDER — TRAZODONE HCL 50 MG PO TABS: 50.0000 mg | ORAL_TABLET | Freq: Every evening | ORAL | 1 refills | Status: DC | PRN

## 2022-03-17 NOTE — Assessment & Plan Note (Signed)
Doing well with Tradjenta.  No side effects with this.  Blood sugar remains well-controlled.  Will plan to continue.

## 2022-03-17 NOTE — Assessment & Plan Note (Signed)
Blood pressure is fairly well-controlled.  Recommend continuation of current medication at current strength.

## 2022-03-17 NOTE — Assessment & Plan Note (Signed)
Status post cardioversion.  Has remained in sinus rhythm.  He continues to see cardiology regularly.  Remains off of amiodarone.

## 2022-03-17 NOTE — Patient Instructions (Signed)
Try adding trazodone at bedtime.  See me again in 6 months or sooner if needed.

## 2022-03-17 NOTE — Assessment & Plan Note (Signed)
Adding trazodone 50 to 100 mg nightly as needed.

## 2022-03-17 NOTE — Progress Notes (Signed)
Damon Acosta - 82 y.o. male MRN [id number]  Date of birth: 01-Jan-1941  Subjective Chief Complaint  Patient presents with   Shortness of Breath    HPI Damon Acosta is a 82 y.o. male here today for follow up visit.   He reports that he is feeling pretty well.  Remains on Tradjenta for management of diabetes.  Overall he is tolerating this well.  No significant side effects at current strength.    History of MI and A. Flutter.  S/p cardioversion and is off of amiodarone. He does continue to see cardiology regularly.  Remains on toprol xl and is anticoagulated with eliquis.  Doing well with both of these.  Denies symptoms including chest pain, shortness of breath, palpitations, headache or increasing fatigue.   Prescribed lexapro previously.  He is no longer taking this.  Has had difficulty with sleep.  His daughter takes trazodone he is interested in trying this.  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   Overactive bladder    Sleep apnea    Stroke [facility])     Past Surgical History:  Procedure Laterality Date   CHOLECYSTECTOMY     COLOSTOMY     PROSTATE SURGERY      Social History   Socioeconomic History   Marital status: Married    Spouse name: Not on file   Number of children: Not on file   Years of education: Not on file   Highest education level: Not on file  Occupational History   Not on file  Tobacco Use   Smoking status: Former   Smokeless  tobacco: Never  Substance and Sexual Activity   Alcohol use: Not Currently   Drug use: Never   Sexual activity: Not Currently  Other Topics Concern   Not on file  Social History Narrative   Not on file   Social Determinants of Health   Financial Resource Strain: Not on file  Food Insecurity: Not on file  Transportation Needs: Not on file  Physical Activity: Not on file  Stress: Stress Concern Present (11/05/2021)   [place]    Feeling of Stress : To some extent  Social Connections: Not on file    Family History  Problem Relation Age of Onset   Diabetes Sister     Health Maintenance  Topic Date Due   DTaP/Tdap/Td (1 - Tdap) Never done   Medicare Annual Wellness (AWV)  10/20/2018   COVID-19 Vaccine (3 - Moderna risk series) 05/21/2019   Diabetic kidney evaluation - Urine ACR  02/06/2021   Zoster Vaccines- Shingrix (2 of 2) 05/02/2021   OPHTHALMOLOGY EXAM  02/27/2022   FOOT EXAM  03/07/2022   INFLUENZA VACCINE  05/11/2022 (Originally 09/10/2021)   Diabetic kidney evaluation - eGFR measurement  08/15/2022   HEMOGLOBIN A1C  09/15/2022   Pneumonia Vaccine 54+ Years old  Completed   HPV VACCINES  Aged Out     ----------------------------------------------------------------------------------------------------------------------------------------------------------------------------------------------------------------- Physical Exam BP 132/64 (BP Location: Right Arm, Patient Position: Sitting, Cuff Size: Normal)   Pulse (!) 58   Ht '5\' 7"'$  (1.702 m)   Wt 191 lb (86.6 kg)   SpO2 100%   BMI 29.91 kg/m   Physical Exam Constitutional:      Appearance: Normal appearance.  Eyes:     General: No scleral icterus. Neurological:     Mental Status: He is alert.  Psychiatric:        Mood and Affect: Mood normal.        Behavior: Behavior normal.      ------------------------------------------------------------------------------------------------------------------------------------------------------------------------------------------------------------------- Assessment and Plan  Hypertension associated with diabetes ([place]) Blood pressure is fairly well-controlled.  Recommend continuation of current medication at current strength.  Atrial flutter ([place]) Status post cardioversion.  Has remained in sinus rhythm.  He continues to see cardiology regularly.  Remains off of amiodarone.  Type 2 diabetes mellitus with chronic kidney disease, without long-term current use of insulin ([place]) Doing well with Tradjenta.  No side effects with this.  Blood sugar remains well-controlled.  Will plan to continue.  Insomnia Adding trazodone 50 to 100 mg nightly as needed.   Meds ordered this encounter  Medications   traZODone (DESYREL) 50 MG tablet    Sig: Take 1-2 tablets (50-100 mg total) by mouth at bedtime as needed for sleep.    Dispense:  90 tablet    Refill:  1    Return in about 6 months (around 09/15/2022) for T2Dm.    This visit occurred during the SARS-CoV-2 public health emergency.  Safety protocols were in place, including screening questions prior to the visit, additional usage of staff PPE, and extensive cleaning of exam room while observing appropriate contact time as indicated for disinfecting solutions.

## 2022-03-29 ENCOUNTER — Other Ambulatory Visit: Payer: Self-pay | Admitting: Family Medicine

## 2022-05-05 DIAGNOSIS — Z932 Ileostomy status: Secondary | ICD-10-CM | POA: Diagnosis not present

## 2022-05-05 DIAGNOSIS — N1831 Chronic kidney disease, stage 3a: Secondary | ICD-10-CM | POA: Diagnosis not present

## 2022-05-05 DIAGNOSIS — I10 Essential (primary) hypertension: Secondary | ICD-10-CM | POA: Diagnosis not present

## 2022-05-05 DIAGNOSIS — I480 Paroxysmal atrial fibrillation: Secondary | ICD-10-CM | POA: Diagnosis not present

## 2022-05-24 DIAGNOSIS — F32A Depression, unspecified: Secondary | ICD-10-CM | POA: Diagnosis not present

## 2022-05-24 DIAGNOSIS — N1832 Chronic kidney disease, stage 3b: Secondary | ICD-10-CM | POA: Diagnosis not present

## 2022-05-24 DIAGNOSIS — M109 Gout, unspecified: Secondary | ICD-10-CM | POA: Diagnosis present

## 2022-05-24 DIAGNOSIS — Z886 Allergy status to analgesic agent status: Secondary | ICD-10-CM | POA: Diagnosis not present

## 2022-05-24 DIAGNOSIS — F0390 Unspecified dementia without behavioral disturbance: Secondary | ICD-10-CM | POA: Diagnosis present

## 2022-05-24 DIAGNOSIS — I10 Essential (primary) hypertension: Secondary | ICD-10-CM | POA: Diagnosis not present

## 2022-05-24 DIAGNOSIS — G9340 Encephalopathy, unspecified: Secondary | ICD-10-CM | POA: Diagnosis not present

## 2022-05-24 DIAGNOSIS — Z91013 Allergy to seafood: Secondary | ICD-10-CM | POA: Diagnosis not present

## 2022-05-24 DIAGNOSIS — Z87891 Personal history of nicotine dependence: Secondary | ICD-10-CM | POA: Diagnosis not present

## 2022-05-24 DIAGNOSIS — R339 Retention of urine, unspecified: Secondary | ICD-10-CM | POA: Diagnosis not present

## 2022-05-24 DIAGNOSIS — I6602 Occlusion and stenosis of left middle cerebral artery: Secondary | ICD-10-CM | POA: Diagnosis present

## 2022-05-24 DIAGNOSIS — G319 Degenerative disease of nervous system, unspecified: Secondary | ICD-10-CM | POA: Diagnosis not present

## 2022-05-24 DIAGNOSIS — E785 Hyperlipidemia, unspecified: Secondary | ICD-10-CM | POA: Diagnosis present

## 2022-05-24 DIAGNOSIS — F419 Anxiety disorder, unspecified: Secondary | ICD-10-CM | POA: Diagnosis not present

## 2022-05-24 DIAGNOSIS — I6503 Occlusion and stenosis of bilateral vertebral arteries: Secondary | ICD-10-CM | POA: Diagnosis not present

## 2022-05-24 DIAGNOSIS — E119 Type 2 diabetes mellitus without complications: Secondary | ICD-10-CM | POA: Diagnosis not present

## 2022-05-24 DIAGNOSIS — R4182 Altered mental status, unspecified: Secondary | ICD-10-CM | POA: Diagnosis not present

## 2022-05-24 DIAGNOSIS — I6521 Occlusion and stenosis of right carotid artery: Secondary | ICD-10-CM | POA: Diagnosis not present

## 2022-05-24 DIAGNOSIS — I129 Hypertensive chronic kidney disease with stage 1 through stage 4 chronic kidney disease, or unspecified chronic kidney disease: Secondary | ICD-10-CM | POA: Diagnosis present

## 2022-05-24 DIAGNOSIS — Z9151 Personal history of suicidal behavior: Secondary | ICD-10-CM | POA: Diagnosis not present

## 2022-05-24 DIAGNOSIS — E1122 Type 2 diabetes mellitus with diabetic chronic kidney disease: Secondary | ICD-10-CM | POA: Diagnosis present

## 2022-05-24 DIAGNOSIS — Z888 Allergy status to other drugs, medicaments and biological substances status: Secondary | ICD-10-CM | POA: Diagnosis not present

## 2022-05-24 DIAGNOSIS — G4730 Sleep apnea, unspecified: Secondary | ICD-10-CM | POA: Diagnosis present

## 2022-05-24 DIAGNOSIS — R7989 Other specified abnormal findings of blood chemistry: Secondary | ICD-10-CM | POA: Diagnosis not present

## 2022-05-24 DIAGNOSIS — R946 Abnormal results of thyroid function studies: Secondary | ICD-10-CM | POA: Diagnosis present

## 2022-05-24 DIAGNOSIS — Z8673 Personal history of transient ischemic attack (TIA), and cerebral infarction without residual deficits: Secondary | ICD-10-CM | POA: Diagnosis not present

## 2022-05-24 DIAGNOSIS — Z96643 Presence of artificial hip joint, bilateral: Secondary | ICD-10-CM | POA: Diagnosis present

## 2022-05-24 DIAGNOSIS — R338 Other retention of urine: Secondary | ICD-10-CM | POA: Diagnosis not present

## 2022-05-24 DIAGNOSIS — D696 Thrombocytopenia, unspecified: Secondary | ICD-10-CM | POA: Diagnosis not present

## 2022-05-24 DIAGNOSIS — Z79899 Other long term (current) drug therapy: Secondary | ICD-10-CM | POA: Diagnosis not present

## 2022-05-24 DIAGNOSIS — Z7984 Long term (current) use of oral hypoglycemic drugs: Secondary | ICD-10-CM | POA: Diagnosis not present

## 2022-05-24 DIAGNOSIS — R9082 White matter disease, unspecified: Secondary | ICD-10-CM | POA: Diagnosis not present

## 2022-05-24 DIAGNOSIS — I480 Paroxysmal atrial fibrillation: Secondary | ICD-10-CM | POA: Diagnosis not present

## 2022-05-24 DIAGNOSIS — D649 Anemia, unspecified: Secondary | ICD-10-CM | POA: Diagnosis not present

## 2022-05-24 DIAGNOSIS — Z7901 Long term (current) use of anticoagulants: Secondary | ICD-10-CM | POA: Diagnosis not present

## 2022-05-25 ENCOUNTER — Other Ambulatory Visit: Payer: Self-pay | Admitting: Family Medicine

## 2022-05-25 DIAGNOSIS — I10 Essential (primary) hypertension: Secondary | ICD-10-CM | POA: Diagnosis not present

## 2022-05-25 DIAGNOSIS — Z91013 Allergy to seafood: Secondary | ICD-10-CM | POA: Diagnosis not present

## 2022-05-25 DIAGNOSIS — R339 Retention of urine, unspecified: Secondary | ICD-10-CM | POA: Diagnosis not present

## 2022-05-25 DIAGNOSIS — Z886 Allergy status to analgesic agent status: Secondary | ICD-10-CM | POA: Diagnosis not present

## 2022-05-25 DIAGNOSIS — Z888 Allergy status to other drugs, medicaments and biological substances status: Secondary | ICD-10-CM | POA: Diagnosis not present

## 2022-05-25 DIAGNOSIS — I779 Disorder of arteries and arterioles, unspecified: Secondary | ICD-10-CM

## 2022-05-25 DIAGNOSIS — Z7984 Long term (current) use of oral hypoglycemic drugs: Secondary | ICD-10-CM | POA: Diagnosis not present

## 2022-05-25 DIAGNOSIS — G4730 Sleep apnea, unspecified: Secondary | ICD-10-CM | POA: Diagnosis present

## 2022-05-25 DIAGNOSIS — R9082 White matter disease, unspecified: Secondary | ICD-10-CM | POA: Diagnosis not present

## 2022-05-25 DIAGNOSIS — F0390 Unspecified dementia without behavioral disturbance: Secondary | ICD-10-CM | POA: Diagnosis present

## 2022-05-25 DIAGNOSIS — E119 Type 2 diabetes mellitus without complications: Secondary | ICD-10-CM | POA: Diagnosis not present

## 2022-05-25 DIAGNOSIS — G9340 Encephalopathy, unspecified: Secondary | ICD-10-CM | POA: Diagnosis not present

## 2022-05-25 DIAGNOSIS — G319 Degenerative disease of nervous system, unspecified: Secondary | ICD-10-CM | POA: Diagnosis not present

## 2022-05-25 DIAGNOSIS — N1832 Chronic kidney disease, stage 3b: Secondary | ICD-10-CM | POA: Diagnosis present

## 2022-05-25 DIAGNOSIS — E1122 Type 2 diabetes mellitus with diabetic chronic kidney disease: Secondary | ICD-10-CM | POA: Diagnosis present

## 2022-05-25 DIAGNOSIS — R338 Other retention of urine: Secondary | ICD-10-CM | POA: Diagnosis present

## 2022-05-25 DIAGNOSIS — Z7901 Long term (current) use of anticoagulants: Secondary | ICD-10-CM | POA: Diagnosis not present

## 2022-05-25 DIAGNOSIS — F419 Anxiety disorder, unspecified: Secondary | ICD-10-CM | POA: Diagnosis present

## 2022-05-25 DIAGNOSIS — I6503 Occlusion and stenosis of bilateral vertebral arteries: Secondary | ICD-10-CM | POA: Diagnosis not present

## 2022-05-25 DIAGNOSIS — I6521 Occlusion and stenosis of right carotid artery: Secondary | ICD-10-CM | POA: Diagnosis present

## 2022-05-25 DIAGNOSIS — I6602 Occlusion and stenosis of left middle cerebral artery: Secondary | ICD-10-CM | POA: Diagnosis present

## 2022-05-25 DIAGNOSIS — E785 Hyperlipidemia, unspecified: Secondary | ICD-10-CM | POA: Diagnosis present

## 2022-05-25 DIAGNOSIS — Z8673 Personal history of transient ischemic attack (TIA), and cerebral infarction without residual deficits: Secondary | ICD-10-CM | POA: Diagnosis not present

## 2022-05-25 DIAGNOSIS — I129 Hypertensive chronic kidney disease with stage 1 through stage 4 chronic kidney disease, or unspecified chronic kidney disease: Secondary | ICD-10-CM | POA: Diagnosis present

## 2022-05-25 DIAGNOSIS — D649 Anemia, unspecified: Secondary | ICD-10-CM | POA: Diagnosis not present

## 2022-05-25 DIAGNOSIS — Z87891 Personal history of nicotine dependence: Secondary | ICD-10-CM | POA: Diagnosis not present

## 2022-05-25 DIAGNOSIS — D696 Thrombocytopenia, unspecified: Secondary | ICD-10-CM | POA: Diagnosis present

## 2022-05-25 DIAGNOSIS — Z96643 Presence of artificial hip joint, bilateral: Secondary | ICD-10-CM | POA: Diagnosis present

## 2022-05-25 DIAGNOSIS — R4182 Altered mental status, unspecified: Secondary | ICD-10-CM | POA: Diagnosis not present

## 2022-05-25 DIAGNOSIS — R7989 Other specified abnormal findings of blood chemistry: Secondary | ICD-10-CM | POA: Diagnosis not present

## 2022-05-25 DIAGNOSIS — F32A Depression, unspecified: Secondary | ICD-10-CM | POA: Diagnosis present

## 2022-05-25 DIAGNOSIS — Z79899 Other long term (current) drug therapy: Secondary | ICD-10-CM | POA: Diagnosis not present

## 2022-05-25 DIAGNOSIS — R946 Abnormal results of thyroid function studies: Secondary | ICD-10-CM | POA: Diagnosis present

## 2022-05-25 DIAGNOSIS — I480 Paroxysmal atrial fibrillation: Secondary | ICD-10-CM | POA: Diagnosis present

## 2022-05-25 DIAGNOSIS — Z9151 Personal history of suicidal behavior: Secondary | ICD-10-CM | POA: Diagnosis not present

## 2022-05-25 DIAGNOSIS — M109 Gout, unspecified: Secondary | ICD-10-CM | POA: Diagnosis present

## 2022-05-25 HISTORY — DX: Disorder of arteries and arterioles, unspecified: I77.9

## 2022-05-26 ENCOUNTER — Telehealth: Payer: Self-pay

## 2022-05-26 NOTE — Transitions of Care (Post Inpatient/ED Visit) (Unsigned)
   05/26/2022  Name: Damon Acosta MRN: [id number] DOB: [birth year]  Today's TOC FU Call Status: Today's TOC FU Call Status:: Unsuccessul Call (1st Attempt) Unsuccessful Call (1st Attempt) Date: 05/26/22  Attempted to reach the patient regarding the most recent Inpatient/ED visit.  Follow Up Plan: Additional outreach attempts will be made to reach the patient to complete the Transitions of Care (Post Inpatient/ED visit) call.   Signature Karena Addison, LPN [facility] Nurse Health Advisor Direct Dial [phone]

## 2022-05-28 NOTE — Transitions of Care (Post Inpatient/ED Visit) (Unsigned)
   05/28/2022  Name: Damon Acosta MRN: [id number] DOB: May 24, 1940  Today's TOC FU Call Status: Today's TOC FU Call Status:: Unsuccessful Call (2nd Attempt) Unsuccessful Call (1st Attempt) Date: 05/26/22 Unsuccessful Call (2nd Attempt) Date: 05/28/22  Attempted to reach the patient regarding the most recent Inpatient/ED visit.  Follow Up Plan: Additional outreach attempts will be made to reach the patient to complete the Transitions of Care (Post Inpatient/ED visit) call.   Signature Karena Addison, LPN [facility] Nurse Health Advisor Direct Dial [phone]

## 2022-05-29 NOTE — Transitions of Care (Post Inpatient/ED Visit) (Signed)
   05/29/2022  Name: Damon Acosta MRN: [id number] DOB: Oct 16, 1940  Today's TOC FU Call Status: Today's TOC FU Call Status:: Unsuccessful Call (2nd Attempt) Unsuccessful Call (1st Attempt) Date: 05/26/22 Unsuccessful Call (2nd Attempt) Date: 05/28/22  Transition Care Management Follow-up Telephone Call Date of Discharge: 05/29/22 Discharge Facility: Other (Non-[facility] Facility) Name of Other ([facility]) Discharge Facility: [facility] Type of Discharge: Inpatient Admission Primary Inpatient Discharge Diagnosis:: urine retention  Items Reviewed:    Home Care and Equipment/Supplies:    Functional Questionnaire:    Follow up appointments reviewed:   Patient is still in hospital   SIGNATURE Karena Addison, LPN [facility] Nurse Health Advisor Direct Dial [phone]

## 2022-05-30 ENCOUNTER — Other Ambulatory Visit: Payer: Self-pay | Admitting: Family Medicine

## 2022-05-30 DIAGNOSIS — E1159 Type 2 diabetes mellitus with other circulatory complications: Secondary | ICD-10-CM

## 2022-05-30 DIAGNOSIS — E1122 Type 2 diabetes mellitus with diabetic chronic kidney disease: Secondary | ICD-10-CM

## 2022-06-02 ENCOUNTER — Inpatient Hospital Stay: Payer: Medicare Other | Admitting: Family Medicine

## 2022-06-03 ENCOUNTER — Telehealth: Payer: Self-pay | Admitting: Family Medicine

## 2022-06-03 ENCOUNTER — Inpatient Hospital Stay: Payer: Medicare Other | Admitting: Family Medicine

## 2022-06-03 NOTE — Telephone Encounter (Signed)
Pt states he was sent home from the hospital with a catheter and no one has been out to change it like they told him would happen. Please advise pt.

## 2022-06-04 ENCOUNTER — Ambulatory Visit (INDEPENDENT_AMBULATORY_CARE_PROVIDER_SITE_OTHER): Payer: Medicare Other | Admitting: Family Medicine

## 2022-06-04 ENCOUNTER — Encounter: Payer: Self-pay | Admitting: Family Medicine

## 2022-06-04 VITALS — BP 123/72 | HR 64 | Ht 67.0 in | Wt 187.0 lb

## 2022-06-04 DIAGNOSIS — Z7984 Long term (current) use of oral hypoglycemic drugs: Secondary | ICD-10-CM

## 2022-06-04 DIAGNOSIS — F332 Major depressive disorder, recurrent severe without psychotic features: Secondary | ICD-10-CM | POA: Diagnosis not present

## 2022-06-04 DIAGNOSIS — E1122 Type 2 diabetes mellitus with diabetic chronic kidney disease: Secondary | ICD-10-CM | POA: Diagnosis not present

## 2022-06-04 DIAGNOSIS — R339 Retention of urine, unspecified: Secondary | ICD-10-CM | POA: Insufficient documentation

## 2022-06-04 DIAGNOSIS — G3184 Mild cognitive impairment, so stated: Secondary | ICD-10-CM

## 2022-06-04 DIAGNOSIS — R4189 Other symptoms and signs involving cognitive functions and awareness: Secondary | ICD-10-CM | POA: Insufficient documentation

## 2022-06-04 DIAGNOSIS — M79671 Pain in right foot: Secondary | ICD-10-CM | POA: Diagnosis not present

## 2022-06-04 MED ORDER — ESCITALOPRAM OXALATE 10 MG PO TABS: 10.0000 mg | ORAL_TABLET | Freq: Every day | ORAL | 1 refills | Status: DC

## 2022-06-04 MED ORDER — TRAZODONE HCL 100 MG PO TABS: 100.0000 mg | ORAL_TABLET | Freq: Every evening | ORAL | 1 refills | Status: DC | PRN

## 2022-06-04 MED ORDER — LINAGLIPTIN 5 MG PO TABS: 5.0000 mg | ORAL_TABLET | Freq: Every day | ORAL | 3 refills | Status: DC

## 2022-06-04 NOTE — Assessment & Plan Note (Signed)
Will restart Tradjenta.

## 2022-06-04 NOTE — Assessment & Plan Note (Signed)
Chronic history of mild cognitive impairment.  Recently with acute worsening which seems of resolved.  Think this may be related to use of diphenhydramine to treat his insomnia.  We discussed avoiding this.

## 2022-06-04 NOTE — Progress Notes (Signed)
Damon Acosta - 82 y.o. male MRN [id number]  Date of birth: 02-Apr-1940  Subjective Chief Complaint  Patient presents with   Hospitalization Follow-up    HPI Burech is a 82 year old male here today for hospital follow-up.  He was hospitalized for changes and mental status as well as acute urinary retention.  Records from hospitalization reviewed through Care Everywhere.  He does have history of prostate cancer.  He had workup for altered mental status including labs, CT and MRI of the head/brain, urinalysis.  No significant abnormalities noted on the studies.  Foley was placed due to urinary retention.  He does have some mild cognitive impairment at baseline.  He has had difficulty with sleep as well as increased anxiety.  Previously on trazodone and Lexapro but has been out of these medications.  He has been using over-the-counter sleep aid which contains diphenhydramine.  He is unsure how much of this he is taking.  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   Overactive bladder    Sleep apnea    Stroke     Past Surgical History:  Procedure Laterality Date   CHOLECYSTECTOMY     COLOSTOMY     PROSTATE SURGERY      Social History   Socioeconomic History   Marital status: Married    Spouse name: Not on file   Number of children: Not on file   Years of education: Not on file   Highest education level: Not on file  Occupational History   Not on file  Tobacco Use   Smoking  status: Former   Smokeless tobacco: Never  Substance and Sexual Activity   Alcohol use: Not Currently   Drug use: Never   Sexual activity: Not Currently  Other Topics Concern   Not on file  Social History Narrative   Not on file   Social Determinants of Health   Financial Resource Strain: Not on file  Food Insecurity: Not on file  Transportation Needs: Not on file  Physical Activity: Not on file  Stress: Stress Concern Present (11/05/2021)   [employer] of Occupational Health - Occupational Stress Questionnaire    Feeling of Stress : To some extent  Social Connections: Not on file    Family History  Problem Relation Age of Onset   Diabetes Sister     Health Maintenance  Topic Date Due   DTaP/Tdap/Td (1 - Tdap) Never done   Diabetic kidney evaluation - Urine ACR  02/06/2021   Zoster Vaccines- Shingrix (2 of 2) 09/03/2022 (Originally 05/02/2021)   COVID-19 Vaccine (3 - Moderna risk series) 11/20/2022 (Originally 05/21/2019)   OPHTHALMOLOGY EXAM  12/04/2022 (Originally 02/27/2022)   Medicare Annual Wellness (AWV)  12/04/2022 (Originally 10/20/2018)   Diabetic kidney evaluation - eGFR measurement  08/15/2022   INFLUENZA VACCINE  09/11/2022   HEMOGLOBIN A1C  09/15/2022   FOOT EXAM  06/04/2023   Pneumonia Vaccine 65+ Years  old  Completed   HPV VACCINES  Aged Out     ----------------------------------------------------------------------------------------------------------------------------------------------------------------------------------------------------------------- Physical Exam BP 123/72 (BP Location: Left Arm, Patient Position: Sitting, Cuff Size: Normal)   Pulse 64   Ht  (1.702 m)   Wt 187 lb (84.8 kg)   SpO2 99%   BMI 29.29 kg/m   Physical Exam Constitutional:      Appearance: Normal appearance.  HENT:     Head: Normocephalic and atraumatic.  Eyes:     General: No scleral icterus. Cardiovascular:     Rate and Rhythm: Normal rate and regular  rhythm.  Pulmonary:     Effort: Pulmonary effort is normal.     Breath sounds: Normal breath sounds.  Neurological:     Mental Status: He is alert and oriented to person, place, and time. Mental status is at baseline.  Psychiatric:        Mood and Affect: Mood normal.        Behavior: Behavior normal.     ------------------------------------------------------------------------------------------------------------------------------------------------------------------------------------------------------------------- Assessment and Plan  Urinary retention Has had urinary retention and currently has Foley.  He has been on oxybutynin which I recommend he discontinue.  Additionally, he has been taking over-the-counter sleep aid containing diphenhydramine which likely causes acute urinary retention  Mild cognitive impairment Chronic history of mild cognitive impairment.  Recently with acute worsening which seems of resolved.  Think this may be related to use of diphenhydramine to treat his insomnia.  We discussed avoiding this.  Type 2 diabetes mellitus with chronic kidney disease, without long-term current use of insulin ([name]) Will restart Tradjenta.  Severe episode of recurrent major depressive disorder, without psychotic features ([name]) Continues to have insomnia related to his depression and anxiety.  Adding Lexapro back on.  Restarting trazodone as needed.   Meds ordered this encounter  Medications   escitalopram (LEXAPRO) 10 MG tablet    Sig: Take 1 tablet (10 mg total) by mouth daily.    Dispense:  90 tablet    Refill:  1   traZODone (DESYREL) 100 MG tablet    Sig: Take 1-1.5 tablets (100-150 mg total) by mouth at bedtime as needed for sleep.    Dispense:  90 tablet    Refill:  1   linagliptin (TRADJENTA) 5 MG TABS tablet    Sig: Take 1 tablet (5 mg total) by mouth daily.    Dispense:  90 tablet    Refill:  3    DX Code Needed  .    Return in about 4 weeks (around  07/02/2022) for F/u BP/T2DM/Sleep/Urinary Retention.    This visit occurred during the SARS-CoV-2 public health emergency.  Safety protocols were in place, including screening questions prior to the visit, additional usage of staff PPE, and extensive cleaning of exam room while observing appropriate contact time as indicated for disinfecting solutions.

## 2022-06-04 NOTE — Assessment & Plan Note (Signed)
Has had urinary retention and currently has Foley.  He has been on oxybutynin which I recommend he discontinue.  Additionally, he has been taking over-the-counter sleep aid containing diphenhydramine which likely causes acute urinary retention

## 2022-06-04 NOTE — Assessment & Plan Note (Signed)
Continues to have insomnia related to his depression and anxiety.  Adding Lexapro back on.  Restarting trazodone as needed.

## 2022-06-04 NOTE — Patient Instructions (Signed)
Let's try trazodone at 100-150mg  Restart lexapro--take 1/2 tab x7 days then increase to full tab Avoid products that contain diphenhydramine/benadryl

## 2022-06-05 DIAGNOSIS — R338 Other retention of urine: Secondary | ICD-10-CM | POA: Diagnosis not present

## 2022-06-05 DIAGNOSIS — C61 Malignant neoplasm of prostate: Secondary | ICD-10-CM | POA: Diagnosis not present

## 2022-06-05 DIAGNOSIS — N320 Bladder-neck obstruction: Secondary | ICD-10-CM | POA: Diagnosis not present

## 2022-06-05 LAB — CMP14+EGFR
ALT: 16 IU/L (ref 0–44)
AST: 19 IU/L (ref 0–40)
Albumin/Globulin Ratio: 1.6 (ref 1.2–2.2)
Albumin: 3.9 g/dL (ref 3.7–4.7)
Alkaline Phosphatase: 93 IU/L (ref 44–121)
BUN/Creatinine Ratio: 18 (ref 10–24)
BUN: 32 mg/dL — ABNORMAL HIGH (ref 8–27)
Bilirubin Total: 0.4 mg/dL (ref 0.0–1.2)
CO2: 19 mmol/L — ABNORMAL LOW (ref 20–29)
Calcium: 9 mg/dL (ref 8.6–10.2)
Chloride: 107 mmol/L — ABNORMAL HIGH (ref 96–106)
Creatinine, Ser: 1.76 mg/dL — ABNORMAL HIGH (ref 0.76–1.27)
Globulin, Total: 2.4 g/dL (ref 1.5–4.5)
Glucose: 134 mg/dL — ABNORMAL HIGH (ref 70–99)
Potassium: 5.1 mmol/L (ref 3.5–5.2)
Sodium: 140 mmol/L (ref 134–144)
Total Protein: 6.3 g/dL (ref 6.0–8.5)
eGFR: 38 mL/min/{1.73_m2} — ABNORMAL LOW (ref >59)

## 2022-06-05 LAB — CBC WITH DIFFERENTIAL/PLATELET
Basophils Absolute: 0 10*3/uL (ref 0.0–0.2)
Basos: 0 %
EOS (ABSOLUTE): 0.2 10*3/uL (ref 0.0–0.4)
Eos: 2 %
Hematocrit: 34.3 % — ABNORMAL LOW (ref 37.5–51.0)
Hemoglobin: 10.7 g/dL — ABNORMAL LOW (ref 13.0–17.7)
Immature Grans (Abs): 0 10*3/uL (ref 0.0–0.1)
Immature Granulocytes: 0 %
Lymphocytes Absolute: 2 10*3/uL (ref 0.7–3.1)
Lymphs: 26 %
MCH: 24.7 pg — ABNORMAL LOW (ref 26.6–33.0)
MCHC: 31.2 g/dL — ABNORMAL LOW (ref 31.5–35.7)
MCV: 79 fL (ref 79–97)
Monocytes Absolute: 0.7 10*3/uL (ref 0.1–0.9)
Monocytes: 8 %
Neutrophils Absolute: 4.9 10*3/uL (ref 1.4–7.0)
Neutrophils: 64 %
Platelets: 191 10*3/uL (ref 150–450)
RBC: 4.34 x10E6/uL (ref 4.14–5.80)
RDW: 14.5 % (ref 11.6–15.4)
WBC: 7.7 10*3/uL (ref 3.4–10.8)

## 2022-06-06 DIAGNOSIS — N320 Bladder-neck obstruction: Secondary | ICD-10-CM | POA: Diagnosis not present

## 2022-06-09 ENCOUNTER — Ambulatory Visit: Payer: Self-pay | Admitting: Licensed Clinical Social Worker

## 2022-06-09 NOTE — Patient Instructions (Signed)
Visit Information  Thank you for taking time to visit with me today. Please don't hesitate to contact me if I can be of assistance to you.   Following are the goals we discussed today:   Goals Addressed             This Visit's Progress    LCSW spoke via phone today with Lawerance Cruel, spouse of patient. Clotilde Dieter is interested in talking with patient PCP to discuss program support for client       Interventions  Spoke via phone with Lawerance Cruel , spouse of client, about program support for [employer]. [place] Clotilde Dieter Abdalla wrote down name of program and wants to talk with Dr. Everrett Coombe, patient PCP, about program support for client LCSW discussed program support for client with RN, LCSW and Pharmacy services [facility] said client is attending scheduled medical appointments. Client has had some history of sleeping challenges Rosa plans to talk with PCP of client about program support and will contact LCSW at [phone] if needed for further information Clotilde Dieter has LCSW phone number and has name of program for client       Kamali Nephew, spouse of client, has LCSW phone number and will call LCSW as needed to further discuss program information   Please call the care guide team at [phone] if you need to cancel or reschedule your appointment.   If you are experiencing a Mental Health or Behavioral Health Crisis or need someone to talk to, please go to [facility] [address] [place] [phone])   The patient / Dash Cardarelli, spouse, verbalized understanding of instructions, educational materials, and care plan provided today and DECLINED offer to receive copy of patient instructions, educational materials, and care plan.   The patient / Dalen [place], spouse, as been provided with contact information for the care management team and has been advised to call with any health related questions or concerns.   Kelton Pillar.Nikoleta Dady MSW, LCSW Licensed Equities trader [facility] Care Management [phone]

## 2022-06-09 NOTE — Patient Outreach (Signed)
  Care Coordination   Follow Up Visit Note   06/09/2022 Name: ALYAAN BUDZYNSKI MRN: [id number] DOB: 01/25/1941  Terisa Starr is a 82 y.o. year old male who sees Everrett Coombe, DO for primary care. I spoke with  Terisa Starr / Lawerance Cruel, spouse of client, via phone today about client needs.    What matters to the patients health and wellness today?  Lawerance Cruel, spouse of client, plans to talk with PCP of client  about program support for client    Goals Addressed             This Visit's Progress    LCSW spoke via phone today with Lawerance Cruel, spouse of patient. Clotilde Dieter is interested in talking with patient PCP to discuss program support for client       Interventions  Spoke via phone with Lawerance Cruel , spouse of client, about program support for [employer]. [place] Clotilde Dieter Pound wrote down name of program and wants to talk with Dr. Everrett Coombe, patient PCP, about program support for client LCSW discussed program support for client with RN, LCSW and Pharmacy services [facility] said client is attending scheduled medical appointments. Client has had some history of sleeping challenges Rosa plans to talk with PCP of client about program support and will contact LCSW at [phone] if needed for further information Clotilde Dieter has LCSW phone number and has name of program for client        SDOH assessments and interventions completed:  Yes  SDOH Interventions Today    Flowsheet Row Most Recent Value  SDOH Interventions   Physical Activity Interventions Other (Comments)  [may have some mobility challenges]  Stress Interventions Other (Comment)  [has stress related to managing medical needs]        Care Coordination Interventions:  Yes, provided   Interventions Today    Flowsheet Row Most Recent Value  Chronic Disease   Chronic disease during today's visit Other  [LCSW spoke via phone with Lawerance Cruel ,spouse of client, about client needs]  General Interventions   General Interventions  Discussed/Reviewed General Interventions Discussed, [employer] program support for client (RN, [employer], Pharmacist)]  Exercise Interventions   Exercise Discussed/Reviewed Physical Activity  Education Interventions   Education Provided Provided Education  Provided Verbal Education On [employer] said client is attending scheduled medical appointments]  Safety Interventions   Safety Discussed/Reviewed Fall Risk       . Follow up plan: Lawerance Cruel, spouse of client, has phone number of LCSW and will call LCSW as needed to further discuss  program support for client   Encounter Outcome:  Pt. Visit Completed   Kelton Pillar.Tosca Pletz MSW, LCSW Licensed Visual merchandiser [facility] Care Management [phone]

## 2022-06-12 ENCOUNTER — Encounter: Payer: Self-pay | Admitting: Podiatry

## 2022-06-12 ENCOUNTER — Ambulatory Visit (INDEPENDENT_AMBULATORY_CARE_PROVIDER_SITE_OTHER): Payer: Medicare Other | Admitting: Podiatry

## 2022-06-12 DIAGNOSIS — N1832 Chronic kidney disease, stage 3b: Secondary | ICD-10-CM

## 2022-06-12 DIAGNOSIS — M79674 Pain in right toe(s): Secondary | ICD-10-CM

## 2022-06-12 DIAGNOSIS — M79675 Pain in left toe(s): Secondary | ICD-10-CM

## 2022-06-12 DIAGNOSIS — B351 Tinea unguium: Secondary | ICD-10-CM

## 2022-06-12 DIAGNOSIS — L84 Corns and callosities: Secondary | ICD-10-CM

## 2022-06-12 DIAGNOSIS — E1122 Type 2 diabetes mellitus with diabetic chronic kidney disease: Secondary | ICD-10-CM | POA: Diagnosis not present

## 2022-06-12 NOTE — Progress Notes (Signed)
  Subjective:  Patient ID: Damon Acosta, male    DOB: [birth year],   MRN: [id number]  Chief Complaint  Patient presents with   Diabetes    Diabetic foot care, nail trim, right foot callus,     82 y.o. male presents for concern of thickened elongated and painful nails that are difficult to trim and callus on right heel. Requesting to have them trimmed today. Relates burning and tingling in their feet. Patient is diabetic and last A1c was  Lab Results  Component Value Date   HGBA1C 6.1 03/17/2022   .   PCP:  Everrett Coombe, DO    . Denies any other pedal complaints. Denies n/v/f/c.   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 CFT <3 seconds. Absent hair growth on digits. Edema noted to bilateral lower extremities. Xerosis noted bilaterally.  Skin. No lacerations or abrasions bilateral feet. Nails 1-5 bilateral  are thickened discolored and elongated with subungual debris. Hyperkeartotic lesion noted to medial right heel.  Musculoskeletal: MMT 5/5 bilateral lower extremities in DF, PF, Inversion and Eversion. Deceased ROM in DF of ankle joint.  Neurological: Sensation intact to light touch. Protective sensation diminished bilateral.    Assessment:   1. Callus of foot   2. Pain due to onychomycosis of toenails of both feet   3. Type 2 diabetes mellitus with stage 3b chronic kidney disease, without long-term current use of insulin ([name])      Plan:  Patient was evaluated and treated and all questions answered. -Discussed and educated patient on diabetic foot care, especially with  regards to the vascular, neurological and musculoskeletal systems.   -Stressed the importance of good glycemic control and the detriment of not  controlling glucose levels in relation to the foot. -Discussed supportive shoes at all times and checking feet regularly.  -Mechanically debrided all nails 1-5 bilateral using sterile nail nipper and filed with dremel without incident  -Hyperkeratotic tissue debrided without incident with #15 blade.  -Discussed DM shoes patient will consider.  -Answered all patient questions -Patient to return  in 3 months for at risk foot care -Patient advised to call the office if any problems or questions arise in the meantime.   Louann Sjogren, DPM

## 2022-06-29 ENCOUNTER — Other Ambulatory Visit: Payer: Self-pay | Admitting: Family Medicine

## 2022-06-29 DIAGNOSIS — Z8673 Personal history of transient ischemic attack (TIA), and cerebral infarction without residual deficits: Secondary | ICD-10-CM

## 2022-07-02 ENCOUNTER — Encounter: Payer: Self-pay | Admitting: Family Medicine

## 2022-07-02 ENCOUNTER — Ambulatory Visit (INDEPENDENT_AMBULATORY_CARE_PROVIDER_SITE_OTHER): Payer: Medicare Other

## 2022-07-02 ENCOUNTER — Ambulatory Visit (INDEPENDENT_AMBULATORY_CARE_PROVIDER_SITE_OTHER): Payer: Medicare Other | Admitting: Family Medicine

## 2022-07-02 VITALS — BP 132/64 | HR 58 | Ht 67.0 in | Wt 192.0 lb

## 2022-07-02 DIAGNOSIS — R0600 Dyspnea, unspecified: Secondary | ICD-10-CM

## 2022-07-02 DIAGNOSIS — I152 Hypertension secondary to endocrine disorders: Secondary | ICD-10-CM

## 2022-07-02 DIAGNOSIS — E1122 Type 2 diabetes mellitus with diabetic chronic kidney disease: Secondary | ICD-10-CM | POA: Diagnosis not present

## 2022-07-02 DIAGNOSIS — Z7984 Long term (current) use of oral hypoglycemic drugs: Secondary | ICD-10-CM | POA: Diagnosis not present

## 2022-07-02 DIAGNOSIS — E1159 Type 2 diabetes mellitus with other circulatory complications: Secondary | ICD-10-CM

## 2022-07-02 DIAGNOSIS — R0989 Other specified symptoms and signs involving the circulatory and respiratory systems: Secondary | ICD-10-CM | POA: Diagnosis not present

## 2022-07-02 LAB — POCT GLYCOSYLATED HEMOGLOBIN (HGB A1C): HbA1c, POC (controlled diabetic range): 5.3 % (ref 0.0–7.0)

## 2022-07-02 MED ORDER — FUROSEMIDE 20 MG PO TABS
20.0000 mg | ORAL_TABLET | Freq: Two times a day (BID) | ORAL | 3 refills | Status: DC
Start: 2022-07-02 — End: 2022-07-09

## 2022-07-02 NOTE — Patient Instructions (Addendum)
Start furosemide twice per day.  Stop downstairs and have chest xray completed.  See me again in 7-10 days.

## 2022-07-07 DIAGNOSIS — R0600 Dyspnea, unspecified: Secondary | ICD-10-CM | POA: Insufficient documentation

## 2022-07-07 NOTE — Assessment & Plan Note (Signed)
Lab Results  Component Value Date   HGBA1C 5.3 07/02/2022   Blood sugars remain well-controlled.  He will continue Tradjenta at current strength.

## 2022-07-07 NOTE — Assessment & Plan Note (Signed)
Blood pressure is well-controlled at this time.  He will continue current medications for management of hypertension.

## 2022-07-07 NOTE — Progress Notes (Signed)
Damon Acosta - 82 y.o. male MRN [id number]  Date of birth: 1941-01-05  Subjective Chief Complaint  Patient presents with   Diabetes    HPI Damon Acosta is a 82 year old male here today for follow-up.  He has not had any further issues with urinary retention since last visit.  He is having some urinary frequency.  He is seeing urology.    He does continue to have some dyspnea.  Seen by his cardiologist but reports that his cardiologist did not seem to think this is an issue.  He does have more dyspnea with exertion.  Weight is up about 5 pounds since last month.  Denies chest pain or palpitations.  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   Overactive bladder    Sleep apnea    Stroke [facility])     Past Surgical History:  Procedure Laterality Date   CHOLECYSTECTOMY     COLOSTOMY     PROSTATE SURGERY      Social History   Socioeconomic History   Marital status: Married    Spouse name: Not on file   Number of children: Not on file   Years of education: Not on file   Highest education level: Not on file  Occupational History   Not on file  Tobacco Use   Smoking status: Former   Smokeless tobacco: Never  Substance and Sexual Activity   Alcohol use: Not Currently   Drug use: Never   Sexual activity: Not Currently  Other Topics Concern   Not on file  Social History Narrative   Not on file   Social Determinants of Health   Financial Resource Strain:  Not on file  Food Insecurity: Not on file  Transportation Needs: Not on file  Physical Activity: Inactive (06/09/2022)   Exercise Vital Sign    Days of Exercise per Week: 0 days    Minutes of Exercise per Session: 0 min  Stress: Stress Concern Present (06/09/2022)   [employer] of Occupational Health - Occupational Stress Questionnaire    Feeling of Stress : To some extent  Social Connections: Not on file    Family History  Problem Relation Age of Onset   Diabetes Sister     Health Maintenance  Topic Date Due   DTaP/Tdap/Td (1 - Tdap) Never done   Diabetic kidney evaluation - Urine ACR  02/06/2021   Zoster Vaccines- Shingrix (2 of 2) 09/03/2022 (Originally 05/02/2021)   COVID-19 Vaccine (4 - 2023-24 season) 11/20/2022 (Originally 10/11/2021)   OPHTHALMOLOGY EXAM  12/04/2022 (Originally 02/27/2022)   Medicare Annual Wellness (AWV)  12/04/2022 (Originally 10/20/2018)   INFLUENZA VACCINE  09/11/2022   HEMOGLOBIN A1C  01/02/2023   Diabetic kidney evaluation - eGFR measurement  06/04/2023   FOOT EXAM  06/12/2023   Pneumonia Vaccine 41+ Years old  Completed   HPV VACCINES  Aged Out     ----------------------------------------------------------------------------------------------------------------------------------------------------------------------------------------------------------------- Physical  Exam BP 132/64 (BP Location: Left Arm, Patient Position: Sitting, Cuff Size: Normal)   Pulse (!) 58   Ht 5\' 7"  (1.702 m)   Wt 192 lb (87.1 kg)   SpO2 98%   BMI 30.07 kg/m   Physical Exam Constitutional:      Appearance: Normal appearance.  HENT:     Head: Normocephalic and atraumatic.  Eyes:     General: No scleral icterus. Cardiovascular:     Rate and Rhythm: Normal rate and regular rhythm.  Pulmonary:     Comments: Bibasilar crackles noted. Neurological:     Mental Status: He is alert.  Psychiatric:        Mood and Affect: Mood normal.        Behavior: Behavior  normal.     ------------------------------------------------------------------------------------------------------------------------------------------------------------------------------------------------------------------- Assessment and Plan  Type 2 diabetes mellitus with chronic kidney disease, without long-term current use of insulin ([name]) Lab Results  Component Value Date   HGBA1C 5.3 07/02/2022   Blood sugars remain well-controlled.  He will continue Tradjenta at current strength.  Dyspnea He has had increased dyspnea over the past several weeks.  Weight is up about 5 pounds from last visit.  This along with bibasilar crackles concern of volume overload.  Adding Lasix and will plan to follow-up in about 7 to 10 days.  Hypertension associated with diabetes ([name]) Blood pressure is well-controlled at this time.  He will continue current medications for management of hypertension.   Meds ordered this encounter  Medications   furosemide (LASIX) 20 MG tablet    Sig: Take 1 tablet (20 mg total) by mouth 2 (two) times daily.    Dispense:  30 tablet    Refill:  3    Return in about 1 week (around 07/09/2022) for F/u dyspnea/edema.    This visit occurred during the SARS-CoV-2 public health emergency.  Safety protocols were in place, including screening questions prior to the visit, additional usage of staff PPE, and extensive cleaning of exam room while observing appropriate contact time as indicated for disinfecting solutions.

## 2022-07-07 NOTE — Assessment & Plan Note (Signed)
He has had increased dyspnea over the past several weeks.  Weight is up about 5 pounds from last visit.  This along with bibasilar crackles concern of volume overload.  Adding Lasix and will plan to follow-up in about 7 to 10 days.

## 2022-07-09 ENCOUNTER — Ambulatory Visit: Payer: Self-pay | Admitting: Licensed Clinical Social Worker

## 2022-07-09 ENCOUNTER — Other Ambulatory Visit: Payer: Self-pay | Admitting: Family Medicine

## 2022-07-09 NOTE — Patient Outreach (Signed)
  Care Coordination   Follow Up Visit Note   07/09/2022 Name: JHAN Acosta MRN: [id number] DOB: September 17, 1940  Damon Acosta is a 82 y.o. year old male who sees Damon Coombe, DO for primary care. I spoke with  Damon Acosta by phone today.  What matters to the patients health and wellness today? Patient uses a cane or a walker as needed to help him walk. Client wants to talk with PCP about program support    Goals Addressed             This Visit's Progress    Patient Stated he uses a cane or a walker as needed to help him walk. Client wants to talk with PCP about program support       Interventions:  Spoke with Damon Acosta today via phone about program support Discussed program support with RN, Pharmacist and LCSW Discussed client support from PCP , Damon Acosta Client said he is scheduled for an appointment tomorrow with Damon Acosta. Client said he would talk with PCP tomorrow about program support service Encouraged Damon Acosta to call LCSW as needed at [phone] to further discuss program support        SDOH assessments and interventions completed:  Yes  SDOH Interventions Today    Flowsheet Row Most Recent Value  SDOH Interventions   Physical Activity Interventions Other (Comments)  [walking challenges,  uses a cane or a walker to help him walk]  Stress Interventions Other (Comment)  [may have stress in managing medical needs]        Care Coordination Interventions:  Yes, provided   Interventions Today    Flowsheet Row Most Recent Value  Chronic Disease   Chronic disease during today's visit Other  [spoke with client about client needs]  General Interventions   General Interventions Discussed/Reviewed General Interventions Discussed, [employer]  [discussed program support]  Exercise Interventions   Exercise Discussed/Reviewed Physical Activity  [uses a cane or a walker as needed to help him walk]  Education Interventions    Education Provided Provided Education  Provided Verbal Education On [employer]  Mental Health Interventions   Mental Health Discussed/Reviewed Anxiety, Coping Strategies  [client did not mention any mood issues. He spoke of support from PCP]  Pharmacy Interventions   Pharmacy Dicussed/Reviewed Pharmacy Topics Discussed        Follow up plan: Client to call LCSW as needed to further discuss program support for client   Encounter Outcome:  Pt. Visit Completed   Damon Acosta.Damon Acosta MSW, LCSW Licensed Visual merchandiser [facility] Care Management [phone]

## 2022-07-09 NOTE — Patient Instructions (Signed)
Visit Information  Thank you for taking time to visit with me today. Please don't hesitate to contact me if I can be of assistance to you.   Following are the goals we discussed today:   Goals Addressed             This Visit's Progress    Patient Stated he uses a cane or a walker as needed to help him walk. Client wants to talk with PCP about program support       Interventions:  Spoke with Terisa Starr today via phone about program support Discussed program support with RN, Pharmacist and LCSW Discussed client support from PCP , Dr. Bertram Millard Client said he is scheduled for an appointment tomorrow with Dr. Ashley Royalty. Client said he would talk with PCP tomorrow about program support service Encouraged Damon Acosta to call LCSW as needed at [phone] to further discuss program support       Client to call LCSW as needed to further discuss program support  Please call the care guide team at [phone] if you need to cancel or reschedule your appointment.   If you are experiencing a Mental Health or Behavioral Health Crisis or need someone to talk to, please go to [facility] [address], [place] [phone])   The patient verbalized understanding of instructions, educational materials, and care plan provided today and DECLINED offer to receive copy of patient instructions, educational materials, and care plan.   The patient has been provided with contact information for the care management team and has been advised to call with any health related questions or concerns.   Damon Acosta.Damon Acosta MSW, LCSW Licensed Visual merchandiser [facility] Management [phone]

## 2022-07-10 ENCOUNTER — Encounter: Payer: Self-pay | Admitting: Family Medicine

## 2022-07-10 ENCOUNTER — Ambulatory Visit (INDEPENDENT_AMBULATORY_CARE_PROVIDER_SITE_OTHER): Payer: Medicare Other | Admitting: Family Medicine

## 2022-07-10 VITALS — BP 109/55 | HR 57 | Ht 67.0 in | Wt 189.0 lb

## 2022-07-10 DIAGNOSIS — E1159 Type 2 diabetes mellitus with other circulatory complications: Secondary | ICD-10-CM

## 2022-07-10 DIAGNOSIS — R0600 Dyspnea, unspecified: Secondary | ICD-10-CM | POA: Diagnosis not present

## 2022-07-10 DIAGNOSIS — I152 Hypertension secondary to endocrine disorders: Secondary | ICD-10-CM | POA: Diagnosis not present

## 2022-07-10 NOTE — Patient Instructions (Signed)
Reduce furosemide to 20mg  ONCE per day.  See me again in 3 months.

## 2022-07-10 NOTE — Progress Notes (Signed)
Damon Acosta - 82 y.o. male MRN [id number]  Date of birth: 1940/06/18  Subjective Chief Complaint  Patient presents with   Edema    HPI Damon Acosta is a 82 year old male here today for follow-up visit.  He had complained of dyspnea at his last visit.  Furosemide added on 20 mg twice daily until follow-up with me this week.  He reports that his edema has improved and his shortness of breath is improved quite a bit.  He has had some increased urination but nothing that really is affecting his quality of life.  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   Overactive bladder    Sleep apnea    Stroke [facility])     Past Surgical History:  Procedure Laterality Date   CHOLECYSTECTOMY     COLOSTOMY     PROSTATE SURGERY      Social History   Socioeconomic History   Marital status: Married    Spouse name: Not on file   Number of children: Not on file   Years of education: Not on file   Highest education level: Not on file  Occupational History   Not on file  Tobacco Use   Smoking status: Former   Smokeless tobacco: Never  Substance and Sexual Activity   Alcohol use: Not Currently   Drug use: Never   Sexual activity: Not Currently  Other Topics Concern   Not on file  Social History Narrative   Not on file   Social Determinants of Health   Financial Resource Strain: Not on file  Food Insecurity: Not on file  Transportation Needs: Not on file  Physical  Activity: Inactive (07/09/2022)   Exercise Vital Sign    Days of Exercise per Week: 0 days    Minutes of Exercise per Session: 0 min  Stress: Stress Concern Present (07/09/2022)   [employer] of Occupational Health - Occupational Stress Questionnaire    Feeling of Stress : To some extent  Social Connections: Not on file    Family History  Problem Relation Age of Onset   Diabetes Sister     Health Maintenance  Topic Date Due   DTaP/Tdap/Td (1 - Tdap) Never done   Diabetic kidney evaluation - Urine ACR  02/06/2021   Zoster Vaccines- Shingrix (2 of 2) 09/03/2022 (Originally 05/02/2021)   COVID-19 Vaccine (4 - 2023-24 season) 11/20/2022 (Originally 10/11/2021)   OPHTHALMOLOGY EXAM  12/04/2022 (Originally 02/27/2022)   Medicare Annual Wellness (AWV)  12/04/2022 (Originally 10/20/2018)   INFLUENZA VACCINE  09/11/2022   HEMOGLOBIN A1C  01/02/2023   Diabetic kidney evaluation - eGFR measurement  06/04/2023   FOOT EXAM  06/12/2023   Pneumonia Vaccine 12+ Years old  Completed   HPV VACCINES  Aged Out     ----------------------------------------------------------------------------------------------------------------------------------------------------------------------------------------------------------------- Physical Exam BP (!) 109/55   Pulse (!) 57   Ht 5\' 7"  (1.702 m)   Wt  189 lb (85.7 kg)   SpO2 99%   BMI 29.60 kg/m   Physical Exam Constitutional:      Appearance: Normal appearance.  HENT:     Head: Normocephalic and atraumatic.  Eyes:     General: No scleral icterus. Cardiovascular:     Rate and Rhythm: Normal rate and regular rhythm.  Pulmonary:     Effort: Pulmonary effort is normal.     Breath sounds: Normal breath sounds.  Neurological:     General: No focal deficit present.     Mental Status: He is alert.  Psychiatric:        Mood and Affect: Mood normal.        Behavior: Behavior normal.      ------------------------------------------------------------------------------------------------------------------------------------------------------------------------------------------------------------------- Assessment and Plan  Dyspnea His dyspnea has improved with Lasix.  Reducing to 20 mg daily.  Updating renal function and potassium.   No orders of the defined types were placed in this encounter.   No follow-ups on file.    This visit occurred during the SARS-CoV-2 public health emergency.  Safety protocols were in place, including screening questions prior to the visit, additional usage of staff PPE, and extensive cleaning of exam room while observing appropriate contact time as indicated for disinfecting solutions.

## 2022-07-10 NOTE — Assessment & Plan Note (Signed)
His dyspnea has improved with Lasix.  Reducing to 20 mg daily.  Updating renal function and potassium.

## 2022-07-11 ENCOUNTER — Other Ambulatory Visit: Payer: Self-pay | Admitting: Family Medicine

## 2022-07-11 DIAGNOSIS — R7989 Other specified abnormal findings of blood chemistry: Secondary | ICD-10-CM

## 2022-07-11 LAB — BASIC METABOLIC PANEL
BUN/Creatinine Ratio: 16 (calc) (ref 6–22)
BUN: 43 mg/dL — ABNORMAL HIGH (ref 7–25)
CO2: 26 mmol/L (ref 20–32)
Calcium: 9.2 mg/dL (ref 8.6–10.3)
Chloride: 106 mmol/L (ref 98–110)
Creat: 2.62 mg/dL — ABNORMAL HIGH (ref 0.70–1.22)
Glucose, Bld: 90 mg/dL (ref 65–99)
Potassium: 5.3 mmol/L (ref 3.5–5.3)
Sodium: 140 mmol/L (ref 135–146)

## 2022-07-21 DIAGNOSIS — R7989 Other specified abnormal findings of blood chemistry: Secondary | ICD-10-CM | POA: Diagnosis not present

## 2022-07-22 LAB — BASIC METABOLIC PANEL
BUN/Creatinine Ratio: 22 (calc) (ref 6–22)
BUN: 44 mg/dL — ABNORMAL HIGH (ref 7–25)
CO2: 24 mmol/L (ref 20–32)
Calcium: 9.5 mg/dL (ref 8.6–10.3)
Chloride: 111 mmol/L — ABNORMAL HIGH (ref 98–110)
Creat: 2 mg/dL — ABNORMAL HIGH (ref 0.70–1.22)
Glucose, Bld: 116 mg/dL — ABNORMAL HIGH (ref 65–99)
Potassium: 5.8 mmol/L — ABNORMAL HIGH (ref 3.5–5.3)
Sodium: 141 mmol/L (ref 135–146)

## 2022-07-25 ENCOUNTER — Other Ambulatory Visit: Payer: Self-pay | Admitting: Family Medicine

## 2022-07-25 ENCOUNTER — Telehealth: Payer: Self-pay | Admitting: Family Medicine

## 2022-07-25 DIAGNOSIS — I152 Hypertension secondary to endocrine disorders: Secondary | ICD-10-CM

## 2022-07-25 NOTE — Telephone Encounter (Signed)
Patients wife called requesting a refill of the patient's sleeping medication (did not know the name).  Pharmacy - [facility] [phone] - [place],  - [address]

## 2022-07-29 MED ORDER — TRAZODONE HCL 100 MG PO TABS: 100.0000 mg | ORAL_TABLET | Freq: Every evening | ORAL | 1 refills | Status: DC | PRN

## 2022-07-29 NOTE — Telephone Encounter (Signed)
Rx sent to pharmacy   

## 2022-08-04 ENCOUNTER — Telehealth: Payer: Self-pay | Admitting: Family Medicine

## 2022-08-04 NOTE — Telephone Encounter (Signed)
Patient called requesting a refill of his heart medication.  Pharmacy ;   [facility] [phone] - [place], [place] - [address]

## 2022-08-05 NOTE — Telephone Encounter (Signed)
Pt has 3 refills at his pharmacy. Roselyn Reef, CMA

## 2022-08-08 ENCOUNTER — Telehealth: Payer: Self-pay | Admitting: Family Medicine

## 2022-08-08 NOTE — Telephone Encounter (Signed)
Patient's wife forgot the information she will call back

## 2022-09-12 ENCOUNTER — Ambulatory Visit (INDEPENDENT_AMBULATORY_CARE_PROVIDER_SITE_OTHER): Payer: Medicare Other | Admitting: Podiatry

## 2022-09-12 DIAGNOSIS — Z91199 Patient's noncompliance with other medical treatment and regimen due to unspecified reason: Secondary | ICD-10-CM

## 2022-09-12 NOTE — Progress Notes (Signed)
No show

## 2022-09-16 ENCOUNTER — Encounter: Payer: Self-pay | Admitting: Family Medicine

## 2022-09-16 ENCOUNTER — Ambulatory Visit (INDEPENDENT_AMBULATORY_CARE_PROVIDER_SITE_OTHER): Payer: Medicare Other | Admitting: Family Medicine

## 2022-09-16 VITALS — BP 175/63 | HR 59 | Ht 67.0 in | Wt 189.0 lb

## 2022-09-16 DIAGNOSIS — E1122 Type 2 diabetes mellitus with diabetic chronic kidney disease: Secondary | ICD-10-CM | POA: Diagnosis not present

## 2022-09-16 DIAGNOSIS — Z8673 Personal history of transient ischemic attack (TIA), and cerebral infarction without residual deficits: Secondary | ICD-10-CM | POA: Diagnosis not present

## 2022-09-16 DIAGNOSIS — F332 Major depressive disorder, recurrent severe without psychotic features: Secondary | ICD-10-CM

## 2022-09-16 DIAGNOSIS — E1169 Type 2 diabetes mellitus with other specified complication: Secondary | ICD-10-CM | POA: Diagnosis not present

## 2022-09-16 DIAGNOSIS — I480 Paroxysmal atrial fibrillation: Secondary | ICD-10-CM | POA: Diagnosis not present

## 2022-09-16 DIAGNOSIS — E785 Hyperlipidemia, unspecified: Secondary | ICD-10-CM | POA: Diagnosis not present

## 2022-09-16 DIAGNOSIS — R0989 Other specified symptoms and signs involving the circulatory and respiratory systems: Secondary | ICD-10-CM | POA: Diagnosis not present

## 2022-09-16 DIAGNOSIS — M48062 Spinal stenosis, lumbar region with neurogenic claudication: Secondary | ICD-10-CM

## 2022-09-16 LAB — POCT GLYCOSYLATED HEMOGLOBIN (HGB A1C): HbA1c, POC (controlled diabetic range): 5.6 % (ref 0.0–7.0)

## 2022-09-16 LAB — HEMOGLOBIN A1C: Hemoglobin A1C: 5.6

## 2022-09-16 MED ORDER — TRIAMCINOLONE ACETONIDE 0.1 % EX CREA: 1.0000 | TOPICAL_CREAM | Freq: Two times a day (BID) | CUTANEOUS | 0 refills | Status: DC

## 2022-09-16 MED ORDER — ESCITALOPRAM OXALATE 10 MG PO TABS: 10.0000 mg | ORAL_TABLET | Freq: Every day | ORAL | 1 refills | Status: DC

## 2022-09-16 MED ORDER — ATORVASTATIN CALCIUM 20 MG PO TABS: 20.0000 mg | ORAL_TABLET | Freq: Every day | ORAL | 3 refills | Status: DC

## 2022-09-16 MED ORDER — PANTOPRAZOLE SODIUM 40 MG PO TBEC: 40.0000 mg | DELAYED_RELEASE_TABLET | Freq: Every day | ORAL | 3 refills | Status: DC

## 2022-09-16 MED ORDER — AZELASTINE HCL 0.1 % NA SOLN: 1.0000 | Freq: Two times a day (BID) | NASAL | 12 refills | Status: AC

## 2022-09-16 NOTE — Progress Notes (Signed)
Damon Acosta - 82 y.o. male MRN [id number]  Date of birth: 1940-03-09  Subjective Chief Complaint  Patient presents with   Diabetes   Hypertension    HPI Damon Acosta is a 82 y.o. male here today for follow up visit.   He reports that he is having continued back pain.  He has had previous ESI and eventually had surgery due to significant spinal stenosis.  Uses standing walker due to continued balance difficulty and decreased strength.    Remains on tradjenta for management of diabetes.  He is doing well with this.  Blood sugars are well controlled with A1c of 5.6%.  Remains on atoravstatin for HLD and history of CAD.    BP elevated today.  He is taking losartan/hydrochlorothiazide and metoprolol.  Has  paroxysmal  A. Fib with good rate control.   Anticoagulated with Eliquis.  Denies bleeding of frequent falls.   Mood is stable with lexapro at current strength.  Has trazodone for sleep which seems to be helpful.    He has rash in the flexor spaces of the L elbow and neck. Has tried hydrocortisone with some improvement.   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)     Past Surgical History:  Procedure Laterality Date   CHOLECYSTECTOMY     COLOSTOMY     PROSTATE SURGERY      Social History   Socioeconomic History   Marital status: Married    Spouse name: Not on file   Number of children: Not on file    Years of education: Not on file   Highest education level: Not on file  Occupational History   Not on file  Tobacco Use   Smoking status: Former   Smokeless tobacco: Never  Substance and Sexual Activity   Alcohol use: Not Currently   Drug use: Never   Sexual activity: Not Currently  Other Topics Concern   Not on file  Social History Narrative   Not on file   Social Determinants of Health   Financial Resource Strain: Low Risk  (07/31/2021)   Received from [facility], [facility]   Overall Financial Resource Strain (CARDIA)    Difficulty of Paying Living Expenses: Not hard at all  Food Insecurity: No Food Insecurity (03/27/2021)   Received from [facility], [facility]   Hunger Vital Sign    Worried About Running Out of Food in the Last Year: Never true    Ran Out of Food in the Last Year: Never true  Transportation Needs: No Transportation Needs (05/25/2022)   Received from [facility], [facility]   PRAPARE - Transportation    Lack of Transportation (Medical): No    Lack of Transportation (Non-Medical): No  Physical Activity: Inactive (07/09/2022)   Exercise Vital Sign    Days of  Exercise per Week: 0 days    Minutes of Exercise per Session: 0 min  Stress: Stress Concern Present (07/09/2022)   [employer] of Occupational Health - Occupational Stress Questionnaire    Feeling of Stress : To some extent  Social Connections: Unknown (06/09/2021)   Received from [facility], [facility]   Social Network    Social Network: Not on file    Family History  Problem Relation Age of Onset   Diabetes Sister     Health Maintenance  Topic Date Due   DTaP/Tdap/Td (1 - Tdap) Never done   Diabetic kidney evaluation - Urine ACR  02/06/2021   Zoster Vaccines- Shingrix (2 of 2) 05/02/2021   INFLUENZA VACCINE  09/11/2022   COVID-19 Vaccine (4 - 2023-24 season) 11/20/2022 (Originally 10/11/2021)   OPHTHALMOLOGY EXAM  12/04/2022 (Originally 02/27/2022)   Medicare  Annual Wellness (AWV)  12/04/2022 (Originally 10/20/2018)   HEMOGLOBIN A1C  03/19/2023   FOOT EXAM  06/12/2023   Diabetic kidney evaluation - eGFR measurement  07/21/2023   Pneumonia Vaccine 49+ Years old  Completed   HPV VACCINES  Aged Out     ----------------------------------------------------------------------------------------------------------------------------------------------------------------------------------------------------------------- Physical Exam BP (!) 175/63   Pulse (!) 59   Ht 5\' 7"  (1.702 m)   Wt 189 lb (85.7 kg)   SpO2 99%   BMI 29.60 kg/m   Physical Exam Constitutional:      Appearance: Normal appearance.  Cardiovascular:     Rate and Rhythm: Normal rate and regular rhythm.  Pulmonary:     Effort: Pulmonary effort is normal.     Breath sounds: Normal breath sounds.  Musculoskeletal:     Cervical back: Neck supple.  Neurological:     Mental Status: He is alert.  Psychiatric:        Mood and Affect: Mood normal.        Behavior: Behavior normal.     ------------------------------------------------------------------------------------------------------------------------------------------------------------------------------------------------------------------- Assessment and Plan  PAF (paroxysmal atrial fibrillation) ([name]) Continue metoprolol for rate control. He will remain anticoagulated with eliquis.   Dyslipidemia associated with type 2 diabetes mellitus ([name]) Continue atorvastatin at current strength.   Type 2 diabetes mellitus with chronic kidney disease, without long-term current use of insulin ([name]) Diabetes remains well controlled.  Recommend continuation of Tradjenta at current strength.    Lumbar spinal stenosis Continues to have low back pain.  S/p spine surgery x2.  Remains on tramadol as needed.   Severe episode of recurrent major depressive disorder, without psychotic features ([name]) Continue lexapro at current strength.   Throat  clearing He does have some reflux symptoms as well as post nasal drainage.  Add astepro and protonix.  F/u in 6 weeks.    Meds ordered this encounter  Medications   pantoprazole (PROTONIX) 40 MG tablet    Sig: Take 1 tablet (40 mg total) by mouth daily.    Dispense:  30 tablet    Refill:  3   triamcinolone cream (KENALOG) 0.1 %    Sig: Apply 1 Application topically 2 (two) times daily.    Dispense:  30 g    Refill:  0   azelastine (ASTELIN) 0.1 % nasal spray    Sig: Place 1 spray into both nostrils 2 (two) times daily. Use in each nostril as directed    Dispense:  30 mL    Refill:  12   atorvastatin (LIPITOR) 20 MG tablet    Sig: Take 1 tablet (20 mg total) by mouth daily.    Dispense:  90 tablet    Refill:  3   escitalopram (LEXAPRO) 10 MG tablet    Sig: Take 1 tablet (10 mg total) by mouth daily.    Dispense:  90 tablet    Refill:  1    Return in about 6 weeks (around 10/28/2022) for f/u frequent throat clearing.    This visit occurred during the SARS-CoV-2 public health emergency.  Safety protocols were in place, including screening questions prior to the visit, additional usage of staff PPE, and extensive cleaning of exam room while observing appropriate contact time as indicated for disinfecting solutions.

## 2022-09-16 NOTE — Assessment & Plan Note (Signed)
Continue lexapro at current strength.

## 2022-09-16 NOTE — Assessment & Plan Note (Signed)
Continue atorvastatin at current strength.  

## 2022-09-16 NOTE — Patient Instructions (Signed)
Start protonix daily.  Use astepro nasal spray daily.  Use triamcinolone cream on rash.  Let me know if not improving.

## 2022-09-16 NOTE — Assessment & Plan Note (Signed)
He does have some reflux symptoms as well as post nasal drainage.  Add astepro and protonix.  F/u in 6 weeks.

## 2022-09-16 NOTE — Assessment & Plan Note (Signed)
Continues to have low back pain.  S/p spine surgery x2.  Remains on tramadol as needed.

## 2022-09-16 NOTE — Assessment & Plan Note (Signed)
Diabetes remains well controlled.  Recommend continuation of Tradjenta at current strength.

## 2022-09-16 NOTE — Assessment & Plan Note (Signed)
Continue metoprolol for rate control. He will remain anticoagulated with eliquis.

## 2022-09-23 ENCOUNTER — Telehealth: Payer: Self-pay | Admitting: Family Medicine

## 2022-09-25 ENCOUNTER — Other Ambulatory Visit: Payer: Self-pay | Admitting: Family Medicine

## 2022-09-30 ENCOUNTER — Ambulatory Visit (INDEPENDENT_AMBULATORY_CARE_PROVIDER_SITE_OTHER): Payer: Medicare Other | Admitting: Family Medicine

## 2022-09-30 VITALS — BP 118/46 | HR 68

## 2022-09-30 DIAGNOSIS — E1122 Type 2 diabetes mellitus with diabetic chronic kidney disease: Secondary | ICD-10-CM

## 2022-09-30 DIAGNOSIS — I152 Hypertension secondary to endocrine disorders: Secondary | ICD-10-CM

## 2022-09-30 DIAGNOSIS — E1159 Type 2 diabetes mellitus with other circulatory complications: Secondary | ICD-10-CM | POA: Diagnosis not present

## 2022-09-30 LAB — POCT UA - MICROALBUMIN
Albumin/Creatinine Ratio, Urine, POC: NORMAL
Creatinine, POC: 200 mg/dL
Microalbumin Ur, POC: 10 mg/L

## 2022-09-30 NOTE — Progress Notes (Signed)
Medical screening examination/treatment was performed by qualified clinical staff member and as supervising physician I was immediately available for consultation/collaboration. I have reviewed documentation and agree with assessment and plan.  Cody Matthews, DO  

## 2022-09-30 NOTE — Progress Notes (Signed)
   Established Patient Office Visit  Subjective   Patient ID: Damon Acosta, male    DOB: [birth year]  Age: 82 y.o. MRN: [id number]  Chief Complaint  Patient presents with   Hypertension   Diabetes    HPI  Damon Acosta is here for blood pressure check. Denies chest pain, shortness of breath, weakness or dizziness.   He did bring in urine as directed.   ROS    Objective:     BP (!) 118/46   Pulse 68   SpO2 100%    Physical Exam   Results for orders placed or performed in visit on 09/30/22  POCT UA - Microalbumin  Result Value Ref Range   Microalbumin Ur, POC 10 mg/L   Creatinine, POC 200 mg/dL   Albumin/Creatinine Ratio, Urine, POC normal       The ASCVD Risk score (Arnett DK, et al., 2019) failed to calculate for the following reasons:   The 2019 ASCVD risk score is only valid for ages 77 to 16   The patient has a prior MI or stroke diagnosis    Assessment & Plan:  Blood pressure check - patient advised to continue current medications as directed. Keep follow up appointment with Damon Acosta.  Microalbumin was within normal limits.   Problem List Items Addressed This Visit       Unprioritized   Hypertension associated with diabetes ([name])   Type 2 diabetes mellitus with chronic kidney disease, without long-term current use of insulin ([name]) - Primary   Relevant Orders   POCT UA - Microalbumin (Completed)    Return in about 4 weeks (around 10/28/2022) for  f/u frequent throat clearing.Damon Acosta, Damon Acosta, CMA

## 2022-10-10 ENCOUNTER — Other Ambulatory Visit: Payer: Self-pay | Admitting: Family Medicine

## 2022-10-10 DIAGNOSIS — Z8739 Personal history of other diseases of the musculoskeletal system and connective tissue: Secondary | ICD-10-CM

## 2022-10-28 ENCOUNTER — Ambulatory Visit (INDEPENDENT_AMBULATORY_CARE_PROVIDER_SITE_OTHER): Payer: Medicare Other | Admitting: Family Medicine

## 2022-10-28 ENCOUNTER — Encounter: Payer: Self-pay | Admitting: Family Medicine

## 2022-10-28 VITALS — BP 90/48 | HR 60 | Ht 67.0 in | Wt 193.0 lb

## 2022-10-28 DIAGNOSIS — Z23 Encounter for immunization: Secondary | ICD-10-CM | POA: Diagnosis not present

## 2022-10-28 DIAGNOSIS — M48062 Spinal stenosis, lumbar region with neurogenic claudication: Secondary | ICD-10-CM

## 2022-10-28 DIAGNOSIS — L209 Atopic dermatitis, unspecified: Secondary | ICD-10-CM | POA: Insufficient documentation

## 2022-10-28 DIAGNOSIS — R0989 Other specified symptoms and signs involving the circulatory and respiratory systems: Secondary | ICD-10-CM | POA: Diagnosis not present

## 2022-10-28 DIAGNOSIS — N3281 Overactive bladder: Secondary | ICD-10-CM | POA: Diagnosis not present

## 2022-10-28 MED ORDER — OXYBUTYNIN CHLORIDE ER 15 MG PO TB24: 15.0000 mg | ORAL_TABLET | Freq: Every day | ORAL | 1 refills | Status: DC

## 2022-10-28 MED ORDER — TRAMADOL HCL 50 MG PO TABS: 50.0000 mg | ORAL_TABLET | Freq: Two times a day (BID) | ORAL | 0 refills | Status: DC

## 2022-10-28 NOTE — Assessment & Plan Note (Signed)
Resolved with triamcinolone

## 2022-10-28 NOTE — Assessment & Plan Note (Signed)
This is resolved with Protonix and Astepro.

## 2022-10-28 NOTE — Assessment & Plan Note (Signed)
Increase the oxybutynin to 15 mg.  If this is not effective we can consider switching to Myrbetriq.

## 2022-10-28 NOTE — Assessment & Plan Note (Signed)
He has continued back pain despite previous surgery.  I did renew his tramadol for now.  Will have him follow-up with Dr. Benjamin Stain.

## 2022-10-28 NOTE — Progress Notes (Signed)
Damon Acosta - 82 y.o. male MRN [id number]  Date of birth: [birth year]  Subjective Chief Complaint  Patient presents with   Urinary Frequency   Dysphagia    HPI Damon Acosta is a  82 y.o. male here today for follow up visit.   Had complaint of frequent throat clearing at last visit.  Astepro and protonix added at last visit.  Reports that this is resolved. .   Rash noted in flexor areas of elbow and neck at last visit.  Triamcinolone cream added and reports improvement of symptoms.  Rash is resolved with triamcinolone.  He does continue to have OAB symptoms.  Oxybutynin has helped some but still with some nocturia.  No side effects from medication at current strength.    Has chronic back pain.  Has had ESI previously and eventually had surgery.  Still with continued pain.  Has seen Dr. Benjamin Stain for this previously.  He is interested in possibly doing repeat back injections.   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   Overactive bladder    Sleep apnea    Stroke [facility])     Past Surgical History:  Procedure Laterality Date   CHOLECYSTECTOMY     COLOSTOMY     PROSTATE SURGERY      Social History   Socioeconomic History   Marital status: Married    Spouse name: Not on file   Number of children: Not on file   Years of education: Not on file   Highest education level: Not on file  Occupational History   Not on file  Tobacco Use   Smoking status:  Former   Smokeless tobacco: Never  Substance and Sexual Activity   Alcohol use: Not Currently   Drug use: Never   Sexual activity: Not Currently  Other Topics Concern   Not on file  Social History Narrative   Not on file   Social Determinants of Health   Financial Resource Strain: Low Risk  (07/31/2021)   Received from [facility], [facility]   Overall Financial Resource Strain (CARDIA)    Difficulty of Paying Living Expenses: Not hard at all  Food Insecurity: No Food Insecurity (03/27/2021)   Received from [facility], [facility]   Hunger Vital Sign    Worried About Running Out of Food in the Last Year: Never true    Ran Out of Food in the Last Year: Never true  Transportation Needs: No Transportation Needs (05/25/2022)   Received from [facility], [facility]   PRAPARE - Transportation    Lack of Transportation (Medical): No    Lack of Transportation (Non-Medical): No  Physical Activity: Inactive (07/09/2022)   Exercise Vital Sign    Days of Exercise per Week: 0 days    Minutes of Exercise per Session: 0 min  Stress: Stress Concern Present (07/09/2022)   [employer] of Occupational Health - Occupational  Stress Questionnaire    Feeling of Stress : To some extent  Social Connections: Unknown (06/09/2021)   Received from [facility], [facility]   Social Network    Social Network: Not on file    Family History  Problem Relation Age of Onset   Diabetes Sister     Health Maintenance  Topic Date Due   DTaP/Tdap/Td (1 - Tdap) Never done   OPHTHALMOLOGY EXAM  12/04/2022 (Originally 02/27/2022)   Medicare Annual Wellness (AWV)  12/04/2022 (Originally 10/20/2018)   Zoster Vaccines- Shingrix (2 of 2) 01/27/2023 (Originally 05/02/2021)   COVID-19 Vaccine (5 - 2023-24 season) 12/23/2022   HEMOGLOBIN A1C  03/19/2023   FOOT EXAM  06/12/2023   Diabetic kidney evaluation - eGFR measurement  07/21/2023   Diabetic kidney evaluation - Urine ACR  09/30/2023    Pneumonia Vaccine 85+ Years old  Completed   INFLUENZA VACCINE  Completed   HPV VACCINES  Aged Out     ----------------------------------------------------------------------------------------------------------------------------------------------------------------------------------------------------------------- Physical Exam BP (!) 90/48 (BP Location: Right Arm, Patient Position: Sitting, Cuff Size: Normal)   Pulse 60   Ht 5\' 7"  (1.702 m)   Wt 193 lb (87.5 kg)   SpO2 100%   BMI 30.23 kg/m   Physical Exam Constitutional:      Appearance: Normal appearance.  HENT:     Head: Normocephalic and atraumatic.  Eyes:     General: No scleral icterus. Cardiovascular:     Rate and Rhythm: Normal rate and regular rhythm.  Pulmonary:     Effort: Pulmonary effort is normal.     Breath sounds: Normal breath sounds.  Musculoskeletal:     Cervical back: Neck supple.  Neurological:     Mental Status: He is alert.  Psychiatric:        Mood and Affect: Mood normal.        Behavior: Behavior normal.     ------------------------------------------------------------------------------------------------------------------------------------------------------------------------------------------------------------------- Assessment and Plan  Lumbar spinal stenosis He has continued back pain despite previous surgery.  I did renew his tramadol for now.  Will have him follow-up with Dr. Benjamin Stain.  OAB (overactive bladder) Increase the oxybutynin to 15 mg.  If this is not effective we can consider switching to Myrbetriq.  Throat clearing This is resolved with Protonix and Astepro.  Atopic dermatitis Resolved with triamcinolone.   Meds ordered this encounter  Medications   oxybutynin (DITROPAN XL) 15 MG 24 hr tablet    Sig: Take 1 tablet (15 mg total) by mouth at bedtime.    Dispense:  90 tablet    Refill:  1   traMADol (ULTRAM) 50 MG tablet    Sig: Take 1 tablet (50 mg total) by mouth 2  (two) times daily.    Dispense:  14 tablet    Refill:  0    Patient is on this long-term, this is a refill.    Return in about 4 months (around 02/27/2023) for Type 2 Diabetes, Hypertension.    This visit occurred during the SARS-CoV-2 public health emergency.  Safety protocols were in place, including screening questions prior to the visit, additional usage of staff PPE, and extensive cleaning of exam room while observing appropriate contact time as indicated for disinfecting solutions.

## 2022-11-06 DIAGNOSIS — I25118 Atherosclerotic heart disease of native coronary artery with other forms of angina pectoris: Secondary | ICD-10-CM | POA: Diagnosis not present

## 2022-11-06 DIAGNOSIS — R0600 Dyspnea, unspecified: Secondary | ICD-10-CM | POA: Diagnosis not present

## 2022-11-06 DIAGNOSIS — I4891 Unspecified atrial fibrillation: Secondary | ICD-10-CM | POA: Diagnosis not present

## 2022-11-06 DIAGNOSIS — I10 Essential (primary) hypertension: Secondary | ICD-10-CM | POA: Diagnosis not present

## 2022-11-12 DIAGNOSIS — R0600 Dyspnea, unspecified: Secondary | ICD-10-CM | POA: Diagnosis not present

## 2022-11-12 DIAGNOSIS — I4891 Unspecified atrial fibrillation: Secondary | ICD-10-CM | POA: Diagnosis not present

## 2022-11-12 DIAGNOSIS — I25118 Atherosclerotic heart disease of native coronary artery with other forms of angina pectoris: Secondary | ICD-10-CM | POA: Diagnosis not present

## 2022-11-12 DIAGNOSIS — J984 Other disorders of lung: Secondary | ICD-10-CM | POA: Diagnosis not present

## 2022-11-19 ENCOUNTER — Ambulatory Visit (INDEPENDENT_AMBULATORY_CARE_PROVIDER_SITE_OTHER): Payer: Medicare Other | Admitting: Sports Medicine

## 2022-11-19 DIAGNOSIS — M5030 Other cervical disc degeneration, unspecified cervical region: Secondary | ICD-10-CM

## 2022-11-19 MED ORDER — PREDNISONE 50 MG PO TABS: ORAL_TABLET | ORAL | 0 refills | Status: DC

## 2022-11-19 NOTE — Assessment & Plan Note (Signed)
Known cervical DDD, we last treated this with axial neck pain and right periscapular radiculitis down the left arm as well to the 1st through 3rd fingers, at that time he had no progressive weakness or red flag symptoms, he responded well to prednisone, home therapy. We avoided NSAIDs due to Eliquis. Now he is having recurrence of neck pain with weakness in both arms and both legs. On exam he has tenderness across the paracervical musculature, he does have some weakness in both legs particularly to dorsiflexion of the ankles, I am not sure if this is related to his lumbar cervical spine. He also has brisk 3+ reflexes in both triceps. Negative Hoffmann sign bilaterally. Hypoesthesia 1st through 4th fingers. Due to progressive weakness and hyperreflexia we will obtain an urgent cervical spine MRI for concern for cervical myelopathy, we will also continue some prednisone and home conditioning.

## 2022-11-19 NOTE — Progress Notes (Signed)
    Procedures performed today:    None.  Independent interpretation of notes and tests performed by another provider:   None.  Brief History, Exam, Impression, and Recommendations:    DDD (degenerative disc disease), cervical Known cervical DDD, we last treated this with axial neck pain and right periscapular radiculitis down the left arm as well to the 1st through 3rd fingers, at that time he had no progressive weakness or red flag symptoms, he responded well to prednisone, home therapy. We avoided NSAIDs due to Eliquis. Now he is having recurrence of neck pain with weakness in both arms and both legs. On exam he has tenderness across the paracervical musculature, he does have some weakness in both legs particularly to dorsiflexion of the ankles, I am not sure if this is related to his lumbar cervical spine. He also has brisk 3+ reflexes in both triceps. Negative Hoffmann sign bilaterally. Hypoesthesia 1st through 4th fingers. Due to progressive weakness and hyperreflexia we will obtain an urgent cervical spine MRI for concern for cervical myelopathy, we will also continue some prednisone and home conditioning.    ____________________________________________ Ihor Austin. Benjamin Stain, M.D., ABFM., CAQSM., AME. Primary Care and Sports Medicine [facility]  Adjunct Professor of Family Medicine  [place] of [facility] [employer]  Restaurant manager, fast food

## 2022-11-29 ENCOUNTER — Other Ambulatory Visit: Payer: Self-pay | Admitting: Family Medicine

## 2022-12-05 ENCOUNTER — Other Ambulatory Visit: Payer: Self-pay | Admitting: Family Medicine

## 2022-12-09 DIAGNOSIS — I10 Essential (primary) hypertension: Secondary | ICD-10-CM | POA: Diagnosis not present

## 2022-12-09 DIAGNOSIS — I4891 Unspecified atrial fibrillation: Secondary | ICD-10-CM | POA: Diagnosis not present

## 2022-12-09 DIAGNOSIS — I25118 Atherosclerotic heart disease of native coronary artery with other forms of angina pectoris: Secondary | ICD-10-CM | POA: Diagnosis not present

## 2022-12-09 DIAGNOSIS — R9389 Abnormal findings on diagnostic imaging of other specified body structures: Secondary | ICD-10-CM | POA: Diagnosis not present

## 2022-12-14 ENCOUNTER — Ambulatory Visit (HOSPITAL_BASED_OUTPATIENT_CLINIC_OR_DEPARTMENT_OTHER)
Admission: RE | Admit: 2022-12-14 | Discharge: 2022-12-14 | Disposition: A | Discharge status: Home / Self Care | Payer: Medicare Other | Source: Ambulatory Visit | Attending: Sports Medicine | Admitting: Sports Medicine

## 2022-12-14 DIAGNOSIS — M5030 Other cervical disc degeneration, unspecified cervical region: Secondary | ICD-10-CM | POA: Diagnosis not present

## 2022-12-14 DIAGNOSIS — M4802 Spinal stenosis, cervical region: Secondary | ICD-10-CM | POA: Diagnosis not present

## 2022-12-14 DIAGNOSIS — M4803 Spinal stenosis, cervicothoracic region: Secondary | ICD-10-CM | POA: Diagnosis not present

## 2022-12-14 DIAGNOSIS — M4852XA Collapsed vertebra, not elsewhere classified, cervical region, initial encounter for fracture: Secondary | ICD-10-CM | POA: Diagnosis not present

## 2022-12-14 DIAGNOSIS — M5001 Cervical disc disorder with myelopathy,  high cervical region: Secondary | ICD-10-CM | POA: Diagnosis not present

## 2022-12-26 DIAGNOSIS — I25118 Atherosclerotic heart disease of native coronary artery with other forms of angina pectoris: Secondary | ICD-10-CM | POA: Diagnosis not present

## 2022-12-26 DIAGNOSIS — Z9889 Other specified postprocedural states: Secondary | ICD-10-CM | POA: Diagnosis not present

## 2022-12-26 DIAGNOSIS — I4891 Unspecified atrial fibrillation: Secondary | ICD-10-CM | POA: Diagnosis not present

## 2023-01-02 ENCOUNTER — Encounter: Payer: Self-pay | Admitting: Sports Medicine

## 2023-01-02 ENCOUNTER — Ambulatory Visit (INDEPENDENT_AMBULATORY_CARE_PROVIDER_SITE_OTHER): Payer: Medicare Other | Admitting: Sports Medicine

## 2023-01-02 DIAGNOSIS — G9589 Other specified diseases of spinal cord: Secondary | ICD-10-CM

## 2023-01-02 MED ORDER — TRAMADOL HCL 50 MG PO TABS: 50.0000 mg | ORAL_TABLET | Freq: Three times a day (TID) | ORAL | 3 refills | Status: DC | PRN

## 2023-01-02 MED ORDER — DEXAMETHASONE 6 MG PO TABS: 6.0000 mg | ORAL_TABLET | Freq: Three times a day (TID) | ORAL | 0 refills | Status: DC

## 2023-01-02 NOTE — Assessment & Plan Note (Signed)
Geoge does have known cervical DDD, he has had axial neck pain treatment for some time now, he also has some right periscapular radicular symptoms down to the left arm, 1st through 3rd fingers. Initially he had no progressive weakness or red flag symptoms, he did respond to prednisone. We have avoided NSAIDs along the way due to Eliquis treatment. Unfortunately at the follow-up visit in October he started to have recurrence of neck pain with weakness in both arms and both legs, he had weakness to dorsiflexion of both ankles, he also had brisk 3+ reflexes in both triceps with negative bilateral Hoffmann signs, hypoesthesia in the 1st through 4th fingers. We obtained an urgent MRI, unfortunately MRI does show cervical cord myelomalacia and spinal cord compression C3-C4 with central canal stenosis at other levels. I did discuss the urgency of the situation, we will get a stat neurosurgery consultation. I will add high-dose Decadron. Tramadol for pain in the meantime. He is uncertain as to whether he would want to consider an operation in spite of the potential risks of untreated spinal cord compression. Would like him to discuss this in further detail with neurosurgery and we can come up with a good plan for him.

## 2023-01-02 NOTE — Progress Notes (Signed)
    Procedures performed today:    None.  Independent interpretation of notes and tests performed by another provider:   None.  Brief History, Exam, Impression, and Recommendations:    Cervical cord myelomalacia ([name]) Damon Acosta does have known cervical DDD, he has had axial neck pain treatment for some time now, he also has some right periscapular radicular symptoms down to the left arm, 1st through 3rd fingers. Initially he had no progressive weakness or red flag symptoms, he did respond to prednisone. We have avoided NSAIDs along the way due to Eliquis treatment. Unfortunately at the follow-up visit in October he started to have recurrence of neck pain with weakness in both arms and both legs, he had weakness to dorsiflexion of both ankles, he also had brisk 3+ reflexes in both triceps with negative bilateral Hoffmann signs, hypoesthesia in the 1st through 4th fingers. We obtained an urgent MRI, unfortunately MRI does show cervical cord myelomalacia and spinal cord compression C3-C4 with central canal stenosis at other levels. I did discuss the urgency of the situation, we will get a stat neurosurgery consultation. I will add high-dose Decadron. Tramadol for pain in the meantime. He is uncertain as to whether he would want to consider an operation in spite of the potential risks of untreated spinal cord compression. Would like him to discuss this in further detail with neurosurgery and we can come up with a good plan for him.  We did consider hospitalization today  ____________________________________________ Damon Acosta. Benjamin Stain, M.D., ABFM., CAQSM., AME. Primary Care and Sports Medicine [facility]  Adjunct Professor of Family Medicine  [place] [employer] [facility] [employer] [facility] [employer]  Restaurant manager, fast food

## 2023-01-05 ENCOUNTER — Other Ambulatory Visit: Payer: Self-pay | Admitting: Family Medicine

## 2023-01-06 DIAGNOSIS — I10 Essential (primary) hypertension: Secondary | ICD-10-CM | POA: Diagnosis not present

## 2023-01-06 DIAGNOSIS — I480 Paroxysmal atrial fibrillation: Secondary | ICD-10-CM | POA: Diagnosis not present

## 2023-01-06 DIAGNOSIS — E119 Type 2 diabetes mellitus without complications: Secondary | ICD-10-CM | POA: Diagnosis not present

## 2023-01-13 DIAGNOSIS — G992 Myelopathy in diseases classified elsewhere: Secondary | ICD-10-CM | POA: Diagnosis not present

## 2023-01-13 DIAGNOSIS — M4802 Spinal stenosis, cervical region: Secondary | ICD-10-CM | POA: Diagnosis not present

## 2023-01-16 ENCOUNTER — Other Ambulatory Visit: Payer: Self-pay | Admitting: Neurosurgery

## 2023-01-16 NOTE — Telephone Encounter (Signed)
This is a [facility]K patient please reroute

## 2023-01-16 NOTE — Telephone Encounter (Signed)
Copied from CRM [id number]. Topic: General - Other >> Jan 16, 2023  8:23 AM Georgeanna Harrison H wrote: Reason for CRM: Pt wanted to let provider know he finally seen the other dr in [place] and he wants to know the update on the surgery.

## 2023-01-23 ENCOUNTER — Encounter: Payer: Self-pay | Admitting: Sports Medicine

## 2023-01-23 ENCOUNTER — Ambulatory Visit (INDEPENDENT_AMBULATORY_CARE_PROVIDER_SITE_OTHER): Payer: Medicare Other | Admitting: Sports Medicine

## 2023-01-23 DIAGNOSIS — G9589 Other specified diseases of spinal cord: Secondary | ICD-10-CM | POA: Diagnosis not present

## 2023-01-23 MED ORDER — DEXAMETHASONE 4 MG PO TABS: ORAL_TABLET | ORAL | 0 refills | Status: AC

## 2023-01-23 NOTE — Assessment & Plan Note (Signed)
Damon Acosta returns, he is a very pleasant 82 year old male, known cervical DDD, we have been treating him for axial neck pain and radicular symptoms, at the last visit however he had recurrence of neck pain with progressive weakness in arms and legs, he also had brisk reflexes 3+ triceps with negative bilateral Hoffmann signs, due to my concern for myelomalacia with his hyperreflexia we added an MRI that did show cervical spinal cord myelomalacia with spinal cord compression C3-C4. We discussed the urgency of the situation, added high-dose Decadron which improved his symptoms temporarily. He did have a follow-up with Dr. Lelon Perla who is going to be operating on him on the 30th. Adding an additional course of Decadron for symptom relief and he can follow-up with me as needed.

## 2023-01-23 NOTE — Progress Notes (Signed)
    Procedures performed today:    None.  Independent interpretation of notes and tests performed by another provider:   None.  Brief History, Exam, Impression, and Recommendations:    Cervical cord myelomalacia ([name]) Donn returns, he is a very pleasant 82 year old male, known cervical DDD, we have been treating him for axial neck pain and radicular symptoms, at the last visit however he had recurrence of neck pain with progressive weakness in arms and legs, he also had brisk reflexes 3+ triceps with negative bilateral Hoffmann signs, due to my concern for myelomalacia with his hyperreflexia we added an MRI that did show cervical spinal cord myelomalacia with spinal cord compression C3-C4. We discussed the urgency of the situation, added high-dose Decadron which improved his symptoms temporarily. He did have a follow-up with Dr. Lelon Perla who is going to be operating on him on the 30th. Adding an additional course of Decadron for symptom relief and he can follow-up with me as needed.    ____________________________________________ Ihor Austin. Benjamin Stain, M.D., ABFM., CAQSM., AME. Primary Care and Sports Medicine [facility]  Adjunct Professor of Family Medicine  [place] [employer] [facility] [employer]  Restaurant manager, fast food

## 2023-01-28 NOTE — Progress Notes (Addendum)
Surgical Instructions   Your procedure is scheduled on December 30, 24.. Report to [facility] Main Entrance "A" at 10:30 A.M., then check in with the Admitting office. Any questions or running late day of surgery: call [phone]  Questions prior to your surgery date: call [phone], Monday-Friday, 8am-4pm. If you experience any cold or flu symptoms such as cough, fever, chills, shortness of breath, etc. between now and your scheduled surgery, please notify us at the above number.     Remember:  Do not eat or drink after midnight the night before your surgery    Take these medicines the morning of surgery with A SIP OF WATER  acetaminophen (TYLENOL)  atorvastatin (LIPITOR)  allopurinol (ZYLOPRIM)  azelastine (ASTELIN) nasal spray escitalopram (LEXAPRO)  metoprolol succinate (TOPROL-XL)  oxybutynin (DITROPAN XL)  pantoprazole (PROTONIX)  traMADol Janean Sark)    May take these medicines IF NEEDED: nitroGLYCERIN (NITROSTAT)  traZODone (DESYREL)   Follow your surgeon's instructions on when to stop Eliquis.  If no instructions were given by your surgeon then you will need to call the office to get those instructions.      One week prior to surgery, STOP taking any Aspirin (unless otherwise instructed by your surgeon) Aleve, Naproxen, Ibuprofen, Motrin, Advil, Goody's, BC's, all herbal medications, fish oil, and non-prescription vitamins.                     Do NOT Smoke (Tobacco/Vaping) for 24 hours prior to your procedure.  If you use a CPAP at night, you may bring your mask/headgear for your overnight stay.   You will be asked to remove any contacts, glasses, piercing's, hearing aid's, dentures/partials prior to surgery. Please bring cases for these items if needed.    Patients discharged the day of surgery will not be allowed to drive home, and someone needs to stay with them for 24 hours.  SURGICAL WAITING ROOM VISITATION Patients may have no more than 2 support people  in the waiting area - these visitors may rotate.   Pre-op nurse will coordinate an appropriate time for 1 ADULT support person, who may not rotate, to accompany patient in pre-op.  Children under the age of 67 must have an adult with them who is not the patient and must remain in the main waiting area with an adult.  If the patient needs to stay at the hospital during part of their recovery, the visitor guidelines for inpatient rooms apply.  Please refer to the [facility] website for the visitor guidelines for any additional information.   If you received a COVID test during your pre-op visit  it is requested that you wear a mask when out in public, stay away from anyone that may not be feeling well and notify your surgeon if you develop symptoms. If you have been in contact with anyone that has tested positive in the last 10 days please notify you surgeon.      Pre-operative 5 CHG Bathing Instructions   You can play a key role in reducing the risk of infection after surgery. Your skin needs to be as free of germs as possible. You can reduce the number of germs on your skin by washing with CHG (chlorhexidine gluconate) soap before surgery. CHG is an antiseptic soap that kills germs and continues to kill germs even after washing.   DO NOT use if you have an allergy to chlorhexidine/CHG or antibacterial soaps. If your skin becomes reddened or irritated, stop using the CHG  and notify one of our RNs at [phone].   Please shower with the CHG soap starting 4 days before surgery using the following schedule:     Please keep in mind the following:  DO NOT shave, including legs and underarms, starting the day of your first shower.   You may shave your face at any point before/day of surgery.  Place clean sheets on your bed the day you start using CHG soap. Use a clean washcloth (not used since being washed) for each shower. DO NOT sleep with pets once you start using the CHG.   CHG Shower  Instructions:  Wash your face and private area with normal soap. If you choose to wash your hair, wash first with your normal shampoo.  After you use shampoo/soap, rinse your hair and body thoroughly to remove shampoo/soap residue.  Turn the water OFF and apply about 3 tablespoons (45 ml) of CHG soap to a CLEAN washcloth.  Apply CHG soap ONLY FROM YOUR NECK DOWN TO YOUR TOES (washing for 3-5 minutes)  DO NOT use CHG soap on face, private areas, open wounds, or sores.  Pay special attention to the area where your surgery is being performed.  If you are having back surgery, having someone wash your back for you may be helpful. Wait 2 minutes after CHG soap is applied, then you may rinse off the CHG soap.  Pat dry with a clean towel  Put on clean clothes/pajamas   If you choose to wear lotion, please use ONLY the CHG-compatible lotions on the back of this paper.   Additional instructions for the day of surgery: DO NOT APPLY any lotions, deodorants, cologne, or perfumes.   Do not bring valuables to the hospital. [facility] is not responsible for any belongings/valuables. Do not wear nail polish, gel polish, artificial nails, or any other type of covering on natural nails (fingers and toes) Do not wear jewelry or makeup Put on clean/comfortable clothes.  Please brush your teeth.  Ask your nurse before applying any prescription medications to the skin.     CHG Compatible Lotions   Aveeno Moisturizing lotion  Cetaphil Moisturizing Cream  Cetaphil Moisturizing Lotion  Clairol Herbal Essence Moisturizing Lotion, Dry Skin  Clairol Herbal Essence Moisturizing Lotion, Extra Dry Skin  Clairol Herbal Essence Moisturizing Lotion, Normal Skin  Curel Age Defying Therapeutic Moisturizing Lotion with Alpha Hydroxy  Curel Extreme Care Body Lotion  Curel Soothing Hands Moisturizing Hand Lotion  Curel Therapeutic Moisturizing Cream, Fragrance-Free  Curel Therapeutic Moisturizing Lotion,  Fragrance-Free  Curel Therapeutic Moisturizing Lotion, Original Formula  Eucerin Daily Replenishing Lotion  Eucerin Dry Skin Therapy Plus Alpha Hydroxy Crme  Eucerin Dry Skin Therapy Plus Alpha Hydroxy Lotion  Eucerin Original Crme  Eucerin Original Lotion  Eucerin Plus Crme Eucerin Plus Lotion  Eucerin TriLipid Replenishing Lotion  Keri Anti-Bacterial Hand Lotion  Keri Deep Conditioning Original Lotion Dry Skin Formula Softly Scented  Keri Deep Conditioning Original Lotion, Fragrance Free Sensitive Skin Formula  Keri Lotion Fast Absorbing Fragrance Free Sensitive Skin Formula  Keri Lotion Fast Absorbing Softly Scented Dry Skin Formula  Keri Original Lotion  Keri Skin Renewal Lotion Keri Silky Smooth Lotion  Keri Silky Smooth Sensitive Skin Lotion  Nivea Body Creamy Conditioning Oil  Nivea Body Extra Enriched Teacher, adult education Moisturizing Lotion Nivea Crme  Nivea Skin Firming Lotion  NutraDerm 30 Skin Lotion  NutraDerm Skin Lotion  NutraDerm Therapeutic Skin Cream  NutraDerm Therapeutic Skin  Lotion  ProShield Protective Hand Cream  Provon moisturizing lotion  Please read over the following fact sheets that you were given.

## 2023-01-29 ENCOUNTER — Encounter (HOSPITAL_COMMUNITY): Payer: Self-pay

## 2023-01-29 ENCOUNTER — Other Ambulatory Visit: Payer: Self-pay

## 2023-01-29 ENCOUNTER — Encounter (HOSPITAL_COMMUNITY)
Admission: RE | Admit: 2023-01-29 | Discharge: 2023-01-29 | Disposition: A | Discharge status: Home / Self Care | Payer: Medicare Other | Source: Ambulatory Visit | Attending: Neurosurgery | Admitting: Neurosurgery

## 2023-01-29 VITALS — BP 138/68 | HR 63 | Temp 98.4°F | Resp 17

## 2023-01-29 DIAGNOSIS — J984 Other disorders of lung: Secondary | ICD-10-CM | POA: Diagnosis not present

## 2023-01-29 DIAGNOSIS — G4733 Obstructive sleep apnea (adult) (pediatric): Secondary | ICD-10-CM | POA: Diagnosis not present

## 2023-01-29 DIAGNOSIS — N1832 Chronic kidney disease, stage 3b: Secondary | ICD-10-CM | POA: Insufficient documentation

## 2023-01-29 DIAGNOSIS — I129 Hypertensive chronic kidney disease with stage 1 through stage 4 chronic kidney disease, or unspecified chronic kidney disease: Secondary | ICD-10-CM | POA: Insufficient documentation

## 2023-01-29 DIAGNOSIS — M4802 Spinal stenosis, cervical region: Secondary | ICD-10-CM | POA: Insufficient documentation

## 2023-01-29 DIAGNOSIS — Z87891 Personal history of nicotine dependence: Secondary | ICD-10-CM | POA: Diagnosis not present

## 2023-01-29 DIAGNOSIS — Z8673 Personal history of transient ischemic attack (TIA), and cerebral infarction without residual deficits: Secondary | ICD-10-CM | POA: Diagnosis not present

## 2023-01-29 DIAGNOSIS — I252 Old myocardial infarction: Secondary | ICD-10-CM | POA: Diagnosis not present

## 2023-01-29 DIAGNOSIS — Z01812 Encounter for preprocedural laboratory examination: Secondary | ICD-10-CM | POA: Diagnosis not present

## 2023-01-29 DIAGNOSIS — I2510 Atherosclerotic heart disease of native coronary artery without angina pectoris: Secondary | ICD-10-CM | POA: Insufficient documentation

## 2023-01-29 DIAGNOSIS — E785 Hyperlipidemia, unspecified: Secondary | ICD-10-CM | POA: Insufficient documentation

## 2023-01-29 DIAGNOSIS — I4819 Other persistent atrial fibrillation: Secondary | ICD-10-CM | POA: Insufficient documentation

## 2023-01-29 DIAGNOSIS — E1122 Type 2 diabetes mellitus with diabetic chronic kidney disease: Secondary | ICD-10-CM | POA: Diagnosis not present

## 2023-01-29 DIAGNOSIS — M109 Gout, unspecified: Secondary | ICD-10-CM | POA: Diagnosis not present

## 2023-01-29 DIAGNOSIS — Z01818 Encounter for other preprocedural examination: Secondary | ICD-10-CM

## 2023-01-29 HISTORY — DX: Atherosclerotic heart disease of native coronary artery without angina pectoris: I25.10

## 2023-01-29 HISTORY — DX: Chronic kidney disease, unspecified: N18.9

## 2023-01-29 HISTORY — DX: Other complications of anesthesia, initial encounter: T88.59XA

## 2023-01-29 HISTORY — DX: Acute myocardial infarction, unspecified: I21.9

## 2023-01-29 HISTORY — DX: Unspecified atrial flutter: I48.92

## 2023-01-29 LAB — CBC
HCT: 33.3 % — ABNORMAL LOW (ref 39.0–52.0)
Hemoglobin: 10.7 g/dL — ABNORMAL LOW (ref 13.0–17.0)
MCH: 25.2 pg — ABNORMAL LOW (ref 26.0–34.0)
MCHC: 32.1 g/dL (ref 30.0–36.0)
MCV: 78.5 fL — ABNORMAL LOW (ref 80.0–100.0)
Platelets: 109 10*3/uL — ABNORMAL LOW (ref 150–400)
RBC: 4.24 MIL/uL (ref 4.22–5.81)
RDW: 15.9 % — ABNORMAL HIGH (ref 11.5–15.5)
WBC: 12.3 10*3/uL — ABNORMAL HIGH (ref 4.0–10.5)
nRBC: 0 % (ref 0.0–0.2)

## 2023-01-29 LAB — BASIC METABOLIC PANEL
Anion gap: 6 (ref 5–15)
BUN: 40 mg/dL — ABNORMAL HIGH (ref 8–23)
CO2: 23 mmol/L (ref 22–32)
Calcium: 9.1 mg/dL (ref 8.9–10.3)
Chloride: 107 mmol/L (ref 98–111)
Creatinine, Ser: 1.63 mg/dL — ABNORMAL HIGH (ref 0.61–1.24)
GFR, Estimated: 42 mL/min — ABNORMAL LOW (ref >60)
Glucose, Bld: 131 mg/dL — ABNORMAL HIGH (ref 70–99)
Potassium: 4.5 mmol/L (ref 3.5–5.1)
Sodium: 136 mmol/L (ref 135–145)

## 2023-01-29 LAB — GLUCOSE, CAPILLARY: Glucose-Capillary: 119 mg/dL — ABNORMAL HIGH (ref 70–99)

## 2023-01-29 LAB — SURGICAL PCR SCREEN
MRSA, PCR: NEGATIVE
Staphylococcus aureus: NEGATIVE

## 2023-01-29 NOTE — Progress Notes (Signed)
PCP - Dr. Everrett Coombe Cardiologist - Dr. Leron Croak  PPM/ICD - Denies Device Orders - n/a Rep Notified - n/a  Chest x-ray - n/a EKG - 12/2022 - requested doc from Dr. Boneta Lucks Stress Test - 12/2022 ECHO - 08/2020 Cardiac Cath - 09/2019  Sleep Study - Yes CPAP - does not use a machine  Fasting Blood Sugar - 105-115 Checks Blood Sugar __1___ times a day.  Patient no longer takes medication as he is controlled with his diet.   Last dose of GLP1 agonist-  denies GLP1 instructions: n/a  Blood Thinner Instructions: Eliquis - pt stated his doctor told him to hold for 3 days prior. Last dose is 02/05/23 Aspirin Instructions: denies taking  ERAS Protcol - npo PRE-SURGERY Ensure or G2- no  COVID TEST- n   Anesthesia review: y, requested EKG tracing from dr. Boneta Lucks. Patient had a stress test in 12/2022  Patient denies shortness of breath, fever, cough and chest pain at PAT appointment. Patient denies any respiratory illnesses at this time.    All instructions explained to the patient, with a verbal understanding of the material. Patient agrees to go over the instructions while at home for a better understanding. Patient also instructed to self quarantine after being tested for COVID-19. The opportunity to ask questions was provided.

## 2023-01-30 ENCOUNTER — Encounter (HOSPITAL_COMMUNITY): Payer: Self-pay

## 2023-01-30 NOTE — Anesthesia Preprocedure Evaluation (Addendum)
Anesthesia Evaluation  Patient identified by MRN, date of birth, ID band Patient awake    Reviewed: Allergy & Precautions, H&P , NPO status , Patient's Chart, lab work & pertinent test results, reviewed documented beta blocker date and time   Airway Mallampati: II  TM Distance: >3 FB Neck ROM: Full    Dental no notable dental hx. (+) Teeth Intact, Dental Advisory Given   Pulmonary sleep apnea , former smoker   Pulmonary exam normal breath sounds clear to auscultation       Cardiovascular hypertension, Pt. on medications and Pt. on home beta blockers + CAD, + Past MI and + Peripheral Vascular Disease  + dysrhythmias Atrial Fibrillation  Rhythm:Regular Rate:Normal     Neuro/Psych   Anxiety Depression    CVA negative neurological ROS     GI/Hepatic Neg liver ROS,GERD  Medicated,,  Endo/Other  diabetes    Renal/GU Renal InsufficiencyRenal disease  negative genitourinary   Musculoskeletal  (+) Arthritis , Osteoarthritis,    Abdominal   Peds  Hematology  (+) Blood dyscrasia, anemia   Anesthesia Other Findings   Reproductive/Obstetrics negative OB ROS                             Anesthesia Physical Anesthesia Plan  ASA: 3  Anesthesia Plan: General   Post-op Pain Management: Tylenol PO (pre-op)*   Induction: Intravenous  PONV Risk Score and Plan: 3 and Ondansetron, Dexamethasone and Treatment may vary due to age or medical condition  Airway Management Planned: Oral ETT and Video Laryngoscope Planned  Additional Equipment:   Intra-op Plan:   Post-operative Plan: Extubation in OR  Informed Consent: I have reviewed the patients History and Physical, chart, labs and discussed the procedure including the risks, benefits and alternatives for the proposed anesthesia with the patient or authorized representative who has indicated his/her understanding and acceptance.     Dental advisory  given  Plan Discussed with: CRNA  Anesthesia Plan Comments: (PAT note written 01/30/2023 by Shonna Chock, PA-C. He has an ileostomy.  )       Anesthesia Quick Evaluation

## 2023-01-30 NOTE — Progress Notes (Signed)
Anesthesia Chart Review:  Case: [id number] Date/Time: 02/09/23 1215   Procedure: Laminectomy  C2 - C7   Anesthesia type: General   Pre-op diagnosis: Stenosis   Location: [facility] ROOM 20 / [facility]   Surgeons: Julio Sicks, MD       DISCUSSION: Patient is an 82 year old male scheduled for the above procedure.   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 He reported delayed emergence from anesthesia.   He had cardiology follow-up with Dr. Boneta Lucks on 01/06/23 following recent Cardiolite SPECT study on 12/26/22 showing EF 70%, no regional wall motion abnormalities, medium size partially reversible perfusion abnormality in the lateral region suggestive of prior infarct with mild peri-infarct ischemia. Dr. Boneta Lucks considered the test "low risk".  He noted that patient had developed cord compression with persistent neck pain and had pending neurosurgery evaluation to discuss potential for surgery. BP controlled. No significant palpitations or afib. Echo on 08/10/20 showed LVEF 55-60%, mild LVH, mildly dilated RV, mild MR, mild-moderate TR, RVSP mildly elevated at 37-49 mmHg.  He reported instructions to hold Eliquis 3 days prior to surgery, last dose planned for 02/05/23.   Anesthesia team to evaluate on the day of surgery.    VS: BP 138/68   Pulse 63   Temp 36.9 C   Resp 17   SpO2 100%    PROVIDERS: Everrett Coombe, DO is PCP  Leron Croak, MD is cardiologist   LABS: Preoperative labs noted. Creatine 1.63, but appears at or below baseline--previously ~1.7-2.00 since August 2021. H/H appears stable at 10.7/33.3. PLT decreased at 109K. A1c 5.6% on  09/16/22. (all labs ordered are listed, but only abnormal results are displayed)  Labs Reviewed  CBC - Abnormal; Notable for the following components:      Result Value   WBC 12.3 (*)    Hemoglobin 10.7 (*)    HCT 33.3 (*)    MCV 78.5 (*)    MCH 25.2 (*)    RDW 15.9 (*)    Platelets 109 (*)    All other components within normal limits  BASIC METABOLIC PANEL - Abnormal; Notable for the following components:   Glucose, Bld 131 (*)    BUN 40 (*)    Creatinine, Ser 1.63 (*)    GFR, Estimated 42 (*)    All other components within normal limits  GLUCOSE, CAPILLARY - Abnormal; Notable for the following components:   Glucose-Capillary 119 (*)    All other components within normal limits  SURGICAL PCR SCREEN     IMAGES: MRI C-spine 12/14/22: IMPRESSION: 1. Advanced and generalized cervical spine degeneration accentuated by congenitally narrow spinal canal. 2. Spinal stenosis causes cord deformity at C3-4 to C6-7 with myelomalacia at C3-4. 3. High-grade foraminal impingement at C3-4 to C6-7.  CXR 11/12/22 ([facility]): FINDINGS:  Nodular density in the posterior right upper lobe. No focal airspace disease, pleural effusion or pneumothorax. The cardiomediastinal silhouette is normal in size and contour. Degenerative change in the spine.  IMPRESSION:  Nodular density in the posterior right upper lobe. Recommend correlation with CT. (Noted by ordering provider Dr. Boneta Lucks. Chest CT discussed at 12/09/22 office visit.)  MRI Head 05/26/22 ([facility]): IMPRESSION:  1. No acute intracranial abnormality or significant interval  change.  2.  Chronic changes as described above.   CT Head & CTA Head/Neck 05/25/22 ([facility]): IMPRESSION:  - The contrast bolus is suboptimal particularly for the segmental arteries. This is a venous phase bolus.  - No proximal filling defect or aneurysm is seen within the anterior or posterior cerebral arteries.  - There may be a stenosis at the posterior  division of the proximal left M2 segment of the middle cerebral artery. The right and into and M3 segments are not well opacified with contrast.  - There are short segment stenoses noted at the V4 segments of the vertebral arteries, left greater than right.  - There is severe narrowing at the right internal carotid artery at the compatible and proximally measuring 8 mm in length and several other short segment 5 to 6 mm stenoses of the right internal carotid artery contributing to several areas of 50-69% stenosis.  - There is an area of abnormal enhancement noted at the skull base near the internal carotid artery region measuring at least 1.8 x 1.1 cm. This may represent some prominent vasculature. This could also represent a glomus tumor. The bolus is suboptimal.  - The left internal carotid artery is occluded just past the carotid bulb.  - The bilateral cervical vertebral arteries appear patent. There is stenosis at the V4 segments.  - If there is persistent clinical concern for a neurologic deficit, consider MRI brain with diffusion-weighted sequences for further evaluation.  - Per April 2024 Discharge Summary, "CT angiogram revealed severe narrowing of the right internal carotid artery and segmental focal stenosis in the MCA, left, also has a left internal carotid artery occlusion. Patient does not have any focal neurological deficits Warranting interventions.He will need continued outpatient follow-up with primary care/neurology."   EKG: 11/06/22 ([facility]): Sinus rhythm. First degree AV block with rate variation. PRi 260 ms. Low voltage in precordial leads.    CV: Nuclear stress test 12/26/22 ([facility]): Impression  1. Unremarkable pharmacologic stress protocol. No chest pain, ischemic EKG changes or arrhythmias with Lexiscan infusion.  2. Normal left ventricular ejection fraction 70%. No regional wall motion abnormality noted.  3. Abnormal Cardiolite SPECT study. Medium size partially  reversible perfusion abnormality in the lateral region suggestive of prior infarct with mild peri-infarct ischemia.    48 hour Holter monitor 08/20/20 ([facility]): IMPRESSION:  Patient was monitored for 48 hours.  Analysis and rhythm strips revealed persistent atrial fibrillation at an average rate of 79 bpm.  Minimum heart rate 63 bpm.  Maximum heart rate 155 bpm.  Premature ventricular contractions represented 0.17% of readable data.  There were rare ventricular couplets.  No prolonged pauses were noted.    Echo 08/10/20 ([facility]): Left Ventricle: Systolic function is normal. EF: 55-60%.    Left Ventricle: There is mild hypertrophy.    Left Ventricle: Left ventricle size is normal.    Left Ventricle: No regional wall motion abnormalities noted.    Left Atrium: Left atrium is mildly dilated. Left atrium volume index is  normal (16-34 mL/m2).    Right Ventricle: Right ventricle is mildly dilated.    Right Atrium: Right atrium is mildly dilated.    Mitral Valve: The leaflets are mildly thickened.    Mitral Valve: There is mild regurgitation with a centrally directed  jet.   Tricuspid Valve: There is mild to moderate regurgitation.    Tricuspid Valve: The right ventricular systolic pressure is mildly  elevated (37-49 mmHg).    Cardiac cath 09/19/19 (  [facility], as outlined in 01/06/23 note by Dr. Boneta Lucks): 1. Hemodynamics: Aortic pressure 113/61, LVEDP 13 2. Coronary system: Left Main: Large caliber vessel with no angiographic evidence of stenosis.LAD system: Large caliber vessel, wraps around the apex. 50% proximal disease, there appears to be a small diagonal branch that has 99% ostial disease. Ramus system: Moderate caliber vessel with about 60 to 70% proximal disease.LCX system: Moderate caliber vessel. 60 to 70% distal disease. RCA system: right dominant. 60% mid disease  - Per [facility] Discharge Summary, "Decision was made to optimize medical management."   Past Medical History:   Diagnosis Date   Anxiety    Arthritis    Atrial flutter ([name])    Cancer ([name])    Carotid artery disease ([name]) 05/25/2022   50-69% RICA stenosis, LICA occlusion justpast the carotid bulb by 05/25/22 CTA   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   CKD (chronic kidney disease)    Complication of anesthesia    patient had dificulty waking up   Coronary artery disease    Diabetes mellitus without complication ([name])    Gout    Hyperlipidemia    Hypertension    Myocardial infarction ([name])    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   Overactive bladder    Sleep apnea    Stroke [facility])     Past Surgical History:  Procedure Laterality Date   CHOLECYSTECTOMY     COLOSTOMY     PROSTATE SURGERY      MEDICATIONS:  ELIQUIS 2.5 MG TABS tablet   acetaminophen (TYLENOL) 650 MG CR tablet   allopurinol (ZYLOPRIM) 100 MG tablet   AMBULATORY NON FORMULARY MEDICATION   atorvastatin (LIPITOR) 20 MG tablet   azelastine (ASTELIN) 0.1 % nasal spray   dexamethasone (DECADRON) 4 MG tablet   escitalopram (LEXAPRO) 10 MG tablet   fluticasone (FLONASE) 50 MCG/ACT nasal spray   furosemide (LASIX) 20 MG tablet   linagliptin (TRADJENTA) 5 MG TABS tablet   methocarbamol (ROBAXIN) 500 MG tablet   metoprolol succinate (TOPROL-XL) 50 MG 24 hr tablet   Multiple Vitamin (MULTIVITAMIN WITH MINERALS) TABS tablet   nitroGLYCERIN (NITROSTAT) 0.4 MG SL tablet   Nystatin POWD   ondansetron (ZOFRAN-ODT) 4 MG disintegrating tablet   oxybutynin (DITROPAN XL) 15 MG 24 hr tablet   pantoprazole (PROTONIX) 40 MG tablet   traMADol (ULTRAM) 50 MG tablet   traZODone (DESYREL) 100 MG tablet   traZODone (DESYREL) 50 MG tablet   triamcinolone cream (KENALOG) 0.1 %   No current facility-administered medications for this encounter.   Meds currently listed as not taking include trazodone 100 mg tablets, Zofran ODT, Nystatin powder, Robaxin, Tradjenta, Lasix, Flonase.   Shonna Chock, PA-C Surgical Short  Stay/Anesthesiology [facility] Phone [phone] [facility] Phone [phone] 01/30/2023 2:11 PM

## 2023-02-05 ENCOUNTER — Encounter (HOSPITAL_COMMUNITY): Payer: Self-pay

## 2023-02-06 NOTE — Progress Notes (Signed)
Pt made aware of surgery time change for 02/09/23 1130-1311, arrival 0930, and to follow all previous instructions given.

## 2023-02-09 ENCOUNTER — Inpatient Hospital Stay (HOSPITAL_COMMUNITY): Payer: Medicare Other

## 2023-02-09 ENCOUNTER — Inpatient Hospital Stay (HOSPITAL_COMMUNITY): Payer: Medicare Other | Admitting: Vascular Surgery

## 2023-02-09 ENCOUNTER — Inpatient Hospital Stay (HOSPITAL_COMMUNITY)
Admission: RE | Admit: 2023-02-09 | Discharge: 2023-02-10 | DRG: 518 | Disposition: A | Discharge status: Home / Self Care | Payer: Medicare Other | Attending: Neurosurgery | Admitting: Neurosurgery

## 2023-02-09 ENCOUNTER — Inpatient Hospital Stay (HOSPITAL_COMMUNITY): Payer: Medicare Other | Admitting: Certified Registered"

## 2023-02-09 ENCOUNTER — Encounter (HOSPITAL_COMMUNITY)
Admission: RE | Disposition: A | Discharge status: Home / Self Care | Payer: Self-pay | Source: Home / Self Care | Attending: Neurosurgery

## 2023-02-09 ENCOUNTER — Other Ambulatory Visit: Payer: Self-pay

## 2023-02-09 ENCOUNTER — Encounter (HOSPITAL_COMMUNITY): Payer: Self-pay | Admitting: Neurosurgery

## 2023-02-09 DIAGNOSIS — Z79899 Other long term (current) drug therapy: Secondary | ICD-10-CM | POA: Diagnosis not present

## 2023-02-09 DIAGNOSIS — I2510 Atherosclerotic heart disease of native coronary artery without angina pectoris: Secondary | ICD-10-CM | POA: Diagnosis present

## 2023-02-09 DIAGNOSIS — M4722 Other spondylosis with radiculopathy, cervical region: Secondary | ICD-10-CM | POA: Diagnosis not present

## 2023-02-09 DIAGNOSIS — Z7984 Long term (current) use of oral hypoglycemic drugs: Secondary | ICD-10-CM

## 2023-02-09 DIAGNOSIS — Z8673 Personal history of transient ischemic attack (TIA), and cerebral infarction without residual deficits: Secondary | ICD-10-CM

## 2023-02-09 DIAGNOSIS — Z01818 Encounter for other preprocedural examination: Secondary | ICD-10-CM

## 2023-02-09 DIAGNOSIS — G959 Disease of spinal cord, unspecified: Secondary | ICD-10-CM | POA: Diagnosis not present

## 2023-02-09 DIAGNOSIS — M4712 Other spondylosis with myelopathy, cervical region: Secondary | ICD-10-CM | POA: Diagnosis not present

## 2023-02-09 DIAGNOSIS — Z87891 Personal history of nicotine dependence: Secondary | ICD-10-CM | POA: Diagnosis not present

## 2023-02-09 DIAGNOSIS — Z886 Allergy status to analgesic agent status: Secondary | ICD-10-CM | POA: Diagnosis not present

## 2023-02-09 DIAGNOSIS — K5981 Ogilvie syndrome: Secondary | ICD-10-CM | POA: Diagnosis present

## 2023-02-09 DIAGNOSIS — Z888 Allergy status to other drugs, medicaments and biological substances status: Secondary | ICD-10-CM

## 2023-02-09 DIAGNOSIS — Z9049 Acquired absence of other specified parts of digestive tract: Secondary | ICD-10-CM | POA: Diagnosis not present

## 2023-02-09 DIAGNOSIS — Z932 Ileostomy status: Secondary | ICD-10-CM

## 2023-02-09 DIAGNOSIS — I252 Old myocardial infarction: Secondary | ICD-10-CM

## 2023-02-09 DIAGNOSIS — N183 Chronic kidney disease, stage 3 unspecified: Secondary | ICD-10-CM | POA: Diagnosis not present

## 2023-02-09 DIAGNOSIS — M48062 Spinal stenosis, lumbar region with neurogenic claudication: Secondary | ICD-10-CM | POA: Diagnosis present

## 2023-02-09 DIAGNOSIS — Z91013 Allergy to seafood: Secondary | ICD-10-CM | POA: Diagnosis not present

## 2023-02-09 DIAGNOSIS — E785 Hyperlipidemia, unspecified: Secondary | ICD-10-CM | POA: Diagnosis not present

## 2023-02-09 DIAGNOSIS — N3281 Overactive bladder: Secondary | ICD-10-CM | POA: Diagnosis not present

## 2023-02-09 DIAGNOSIS — Z833 Family history of diabetes mellitus: Secondary | ICD-10-CM

## 2023-02-09 DIAGNOSIS — F418 Other specified anxiety disorders: Secondary | ICD-10-CM | POA: Diagnosis not present

## 2023-02-09 DIAGNOSIS — G8250 Quadriplegia, unspecified: Secondary | ICD-10-CM | POA: Diagnosis not present

## 2023-02-09 DIAGNOSIS — I129 Hypertensive chronic kidney disease with stage 1 through stage 4 chronic kidney disease, or unspecified chronic kidney disease: Secondary | ICD-10-CM | POA: Diagnosis not present

## 2023-02-09 DIAGNOSIS — M4802 Spinal stenosis, cervical region: Principal | ICD-10-CM | POA: Diagnosis present

## 2023-02-09 DIAGNOSIS — Z7901 Long term (current) use of anticoagulants: Secondary | ICD-10-CM | POA: Diagnosis not present

## 2023-02-09 DIAGNOSIS — G992 Myelopathy in diseases classified elsewhere: Secondary | ICD-10-CM | POA: Diagnosis not present

## 2023-02-09 DIAGNOSIS — N189 Chronic kidney disease, unspecified: Secondary | ICD-10-CM | POA: Diagnosis present

## 2023-02-09 DIAGNOSIS — E1122 Type 2 diabetes mellitus with diabetic chronic kidney disease: Secondary | ICD-10-CM | POA: Diagnosis not present

## 2023-02-09 DIAGNOSIS — M47812 Spondylosis without myelopathy or radiculopathy, cervical region: Secondary | ICD-10-CM | POA: Diagnosis not present

## 2023-02-09 HISTORY — PX: POSTERIOR CERVICAL LAMINECTOMY: SHX2248

## 2023-02-09 LAB — GLUCOSE, CAPILLARY
Glucose-Capillary: 96 mg/dL (ref 70–99)
Glucose-Capillary: 107 mg/dL — ABNORMAL HIGH (ref 70–99)
Glucose-Capillary: 108 mg/dL — ABNORMAL HIGH (ref 70–99)
Glucose-Capillary: 121 mg/dL — ABNORMAL HIGH (ref 70–99)
Glucose-Capillary: 139 mg/dL — ABNORMAL HIGH (ref 70–99)

## 2023-02-09 SURGERY — POSTERIOR CERVICAL LAMINECTOMY
Anesthesia: General | Site: Spine Cervical

## 2023-02-09 MED ORDER — ONDANSETRON HCL 4 MG/2ML IJ SOLN: 4.0000 mg | Freq: Four times a day (QID) | INTRAMUSCULAR | Status: DC | PRN

## 2023-02-09 MED ORDER — NITROGLYCERIN 0.4 MG SL SUBL: 0.4000 mg | SUBLINGUAL_TABLET | SUBLINGUAL | Status: DC | PRN

## 2023-02-09 MED ORDER — SUGAMMADEX SODIUM 200 MG/2ML IV SOLN
INTRAVENOUS | Status: DC | PRN
  Administered 2023-02-09: 200 mg via INTRAVENOUS
  Administered 2023-02-09: 100 mg via INTRAVENOUS

## 2023-02-09 MED ORDER — HYDROMORPHONE HCL 1 MG/ML IJ SOLN: 1.0000 mg | INTRAMUSCULAR | Status: DC | PRN

## 2023-02-09 MED ORDER — TRAZODONE HCL 50 MG PO TABS
50.0000 mg | ORAL_TABLET | Freq: Every evening | ORAL | Status: DC | PRN
  Administered 2023-02-09: 50 mg via ORAL
  Filled 2023-02-09: qty 2

## 2023-02-09 MED ORDER — HYDROMORPHONE HCL 1 MG/ML IJ SOLN
0.2500 mg | INTRAMUSCULAR | Status: DC | PRN
  Administered 2023-02-09 (×4): 0.5 mg via INTRAVENOUS

## 2023-02-09 MED ORDER — DEXAMETHASONE SODIUM PHOSPHATE 10 MG/ML IJ SOLN
INTRAMUSCULAR | Status: DC | PRN
  Administered 2023-02-09: 5 mg via INTRAVENOUS

## 2023-02-09 MED ORDER — HYDROMORPHONE HCL 1 MG/ML IJ SOLN
INTRAMUSCULAR | Status: AC
  Filled 2023-02-09: qty 1

## 2023-02-09 MED ORDER — CHLORHEXIDINE GLUCONATE CLOTH 2 % EX PADS: 6.0000 | MEDICATED_PAD | Freq: Once | CUTANEOUS | Status: DC

## 2023-02-09 MED ORDER — EPHEDRINE 5 MG/ML INJ
INTRAVENOUS | Status: AC
  Filled 2023-02-09: qty 5

## 2023-02-09 MED ORDER — FENTANYL CITRATE (PF) 250 MCG/5ML IJ SOLN
INTRAMUSCULAR | Status: AC
  Filled 2023-02-09: qty 5

## 2023-02-09 MED ORDER — FUROSEMIDE 20 MG PO TABS
20.0000 mg | ORAL_TABLET | Freq: Two times a day (BID) | ORAL | Status: DC
Start: 2023-02-09 — End: 2023-02-10
  Administered 2023-02-09 – 2023-02-10 (×2): 20 mg via ORAL
  Filled 2023-02-09 (×2): qty 1

## 2023-02-09 MED ORDER — ACETAMINOPHEN 500 MG PO TABS
1000.0000 mg | ORAL_TABLET | Freq: Once | ORAL | Status: AC
  Administered 2023-02-09: 1000 mg via ORAL
  Filled 2023-02-09: qty 2

## 2023-02-09 MED ORDER — METOPROLOL SUCCINATE ER 50 MG PO TB24
50.0000 mg | ORAL_TABLET | Freq: Every day | ORAL | Status: DC
  Administered 2023-02-10: 50 mg via ORAL
  Filled 2023-02-09: qty 1

## 2023-02-09 MED ORDER — PROPOFOL 10 MG/ML IV BOLUS
INTRAVENOUS | Status: DC | PRN
  Administered 2023-02-09: 130 mg via INTRAVENOUS

## 2023-02-09 MED ORDER — ATORVASTATIN CALCIUM 10 MG PO TABS
20.0000 mg | ORAL_TABLET | Freq: Every day | ORAL | Status: DC
  Administered 2023-02-10: 20 mg via ORAL
  Filled 2023-02-09: qty 2

## 2023-02-09 MED ORDER — BUPIVACAINE HCL (PF) 0.25 % IJ SOLN
INTRAMUSCULAR | Status: AC
  Filled 2023-02-09: qty 30

## 2023-02-09 MED ORDER — HYDROCODONE-ACETAMINOPHEN 10-325 MG PO TABS: 2.0000 | ORAL_TABLET | ORAL | Status: DC | PRN

## 2023-02-09 MED ORDER — METHOCARBAMOL 500 MG PO TABS
500.0000 mg | ORAL_TABLET | Freq: Four times a day (QID) | ORAL | Status: DC | PRN
Start: 2023-02-09 — End: 2023-02-10
  Administered 2023-02-09 – 2023-02-10 (×2): 500 mg via ORAL
  Filled 2023-02-09 (×2): qty 1

## 2023-02-09 MED ORDER — ADULT MULTIVITAMIN W/MINERALS CH
1.0000 | ORAL_TABLET | Freq: Every day | ORAL | Status: DC
  Administered 2023-02-09 – 2023-02-10 (×2): 1 via ORAL
  Filled 2023-02-09 (×2): qty 1

## 2023-02-09 MED ORDER — ACETAMINOPHEN 325 MG PO TABS: 650.0000 mg | ORAL_TABLET | ORAL | Status: DC | PRN

## 2023-02-09 MED ORDER — MENTHOL 3 MG MT LOZG: 1.0000 | LOZENGE | OROMUCOSAL | Status: DC | PRN

## 2023-02-09 MED ORDER — PHENYLEPHRINE 80 MCG/ML (10ML) SYRINGE FOR IV PUSH (FOR BLOOD PRESSURE SUPPORT)
PREFILLED_SYRINGE | INTRAVENOUS | Status: AC
  Filled 2023-02-09: qty 10

## 2023-02-09 MED ORDER — LIDOCAINE 2% (20 MG/ML) 5 ML SYRINGE
INTRAMUSCULAR | Status: AC
  Filled 2023-02-09: qty 5

## 2023-02-09 MED ORDER — ONDANSETRON HCL 4 MG/2ML IJ SOLN
INTRAMUSCULAR | Status: DC | PRN
  Administered 2023-02-09: 4 mg via INTRAVENOUS

## 2023-02-09 MED ORDER — ONDANSETRON HCL 4 MG PO TABS: 4.0000 mg | ORAL_TABLET | Freq: Four times a day (QID) | ORAL | Status: DC | PRN

## 2023-02-09 MED ORDER — ESCITALOPRAM OXALATE 10 MG PO TABS
10.0000 mg | ORAL_TABLET | Freq: Every day | ORAL | Status: DC
  Administered 2023-02-10: 10 mg via ORAL
  Filled 2023-02-09: qty 1

## 2023-02-09 MED ORDER — ORAL CARE MOUTH RINSE: 15.0000 mL | Freq: Once | OROMUCOSAL | Status: AC

## 2023-02-09 MED ORDER — HYDROCODONE-ACETAMINOPHEN 5-325 MG PO TABS
ORAL_TABLET | ORAL | Status: AC
  Filled 2023-02-09: qty 1

## 2023-02-09 MED ORDER — PROPOFOL 500 MG/50ML IV EMUL
INTRAVENOUS | Status: DC | PRN
  Administered 2023-02-09: 100 ug/kg/min via INTRAVENOUS

## 2023-02-09 MED ORDER — FENTANYL CITRATE (PF) 250 MCG/5ML IJ SOLN
INTRAMUSCULAR | Status: DC | PRN
  Administered 2023-02-09 (×3): 50 ug via INTRAVENOUS
  Administered 2023-02-09: 100 ug via INTRAVENOUS

## 2023-02-09 MED ORDER — ALLOPURINOL 100 MG PO TABS
100.0000 mg | ORAL_TABLET | Freq: Every day | ORAL | Status: DC
Start: 2023-02-10 — End: 2023-02-10
  Administered 2023-02-10: 100 mg via ORAL
  Filled 2023-02-09: qty 1

## 2023-02-09 MED ORDER — PHENOL 1.4 % MT LIQD: 1.0000 | OROMUCOSAL | Status: DC | PRN

## 2023-02-09 MED ORDER — PANTOPRAZOLE SODIUM 40 MG PO TBEC
40.0000 mg | DELAYED_RELEASE_TABLET | Freq: Every day | ORAL | Status: DC
  Administered 2023-02-10: 40 mg via ORAL
  Filled 2023-02-09: qty 1

## 2023-02-09 MED ORDER — HYDRALAZINE HCL 20 MG/ML IJ SOLN
INTRAMUSCULAR | Status: AC
  Filled 2023-02-09: qty 1

## 2023-02-09 MED ORDER — VANCOMYCIN HCL 1000 MG IV SOLR
INTRAVENOUS | Status: DC | PRN
  Administered 2023-02-09: 1000 mg

## 2023-02-09 MED ORDER — PROPOFOL 1000 MG/100ML IV EMUL
INTRAVENOUS | Status: AC
  Filled 2023-02-09: qty 100

## 2023-02-09 MED ORDER — CEFAZOLIN SODIUM-DEXTROSE 2-4 GM/100ML-% IV SOLN
2.0000 g | INTRAVENOUS | Status: AC
  Administered 2023-02-09: 2 g via INTRAVENOUS
  Filled 2023-02-09: qty 100

## 2023-02-09 MED ORDER — ONDANSETRON HCL 4 MG/2ML IJ SOLN
INTRAMUSCULAR | Status: AC
  Filled 2023-02-09: qty 2

## 2023-02-09 MED ORDER — ROCURONIUM BROMIDE 10 MG/ML (PF) SYRINGE
PREFILLED_SYRINGE | INTRAVENOUS | Status: AC
  Filled 2023-02-09: qty 10

## 2023-02-09 MED ORDER — PROPOFOL 10 MG/ML IV BOLUS
INTRAVENOUS | Status: AC
  Filled 2023-02-09: qty 20

## 2023-02-09 MED ORDER — THROMBIN 20000 UNITS EX SOLR: CUTANEOUS | Status: DC | PRN

## 2023-02-09 MED ORDER — DOCUSATE SODIUM 100 MG PO CAPS
100.0000 mg | ORAL_CAPSULE | Freq: Two times a day (BID) | ORAL | Status: DC
  Administered 2023-02-10: 100 mg via ORAL
  Filled 2023-02-09: qty 1

## 2023-02-09 MED ORDER — AZELASTINE HCL 0.1 % NA SOLN
1.0000 | Freq: Two times a day (BID) | NASAL | Status: DC
  Administered 2023-02-10: 1 via NASAL
  Filled 2023-02-09: qty 30

## 2023-02-09 MED ORDER — ROCURONIUM BROMIDE 10 MG/ML (PF) SYRINGE
PREFILLED_SYRINGE | INTRAVENOUS | Status: DC | PRN
  Administered 2023-02-09: 20 mg via INTRAVENOUS
  Administered 2023-02-09: 70 mg via INTRAVENOUS

## 2023-02-09 MED ORDER — ACETAMINOPHEN 650 MG RE SUPP: 650.0000 mg | RECTAL | Status: DC | PRN

## 2023-02-09 MED ORDER — LIDOCAINE HCL (PF) 2 % IJ SOLN
INTRAMUSCULAR | Status: DC | PRN
  Administered 2023-02-09: 60 mg via INTRADERMAL

## 2023-02-09 MED ORDER — THROMBIN 20000 UNITS EX SOLR
CUTANEOUS | Status: AC
  Filled 2023-02-09: qty 20000

## 2023-02-09 MED ORDER — SODIUM CHLORIDE 0.9 % IV SOLN
250.0000 mL | INTRAVENOUS | Status: DC
  Administered 2023-02-09: 250 mL via INTRAVENOUS

## 2023-02-09 MED ORDER — 0.9 % SODIUM CHLORIDE (POUR BTL) OPTIME
TOPICAL | Status: DC | PRN
  Administered 2023-02-09: 1000 mL

## 2023-02-09 MED ORDER — HYDROCODONE-ACETAMINOPHEN 10-325 MG PO TABS
1.0000 | ORAL_TABLET | ORAL | Status: DC | PRN
Start: 2023-02-09 — End: 2023-02-10
  Administered 2023-02-09: 1 via ORAL
  Administered 2023-02-10 (×3): 2 via ORAL
  Filled 2023-02-09: qty 2
  Filled 2023-02-09: qty 1
  Filled 2023-02-09 (×2): qty 2

## 2023-02-09 MED ORDER — FLUTICASONE PROPIONATE 50 MCG/ACT NA SUSP
1.0000 | Freq: Every day | NASAL | Status: DC
  Administered 2023-02-10: 1 via NASAL
  Filled 2023-02-09: qty 16

## 2023-02-09 MED ORDER — METHOCARBAMOL 1000 MG/10ML IJ SOLN
INTRAMUSCULAR | Status: AC
  Filled 2023-02-09: qty 10

## 2023-02-09 MED ORDER — SENNA 8.6 MG PO TABS
1.0000 | ORAL_TABLET | Freq: Two times a day (BID) | ORAL | Status: DC
  Administered 2023-02-10: 8.6 mg via ORAL
  Filled 2023-02-09: qty 1

## 2023-02-09 MED ORDER — VANCOMYCIN HCL 1000 MG IV SOLR
INTRAVENOUS | Status: AC
  Filled 2023-02-09: qty 20

## 2023-02-09 MED ORDER — OXYBUTYNIN CHLORIDE ER 5 MG PO TB24
15.0000 mg | ORAL_TABLET | Freq: Every day | ORAL | Status: DC
  Filled 2023-02-09: qty 1

## 2023-02-09 MED ORDER — SODIUM CHLORIDE 0.9% FLUSH: 3.0000 mL | INTRAVENOUS | Status: DC | PRN

## 2023-02-09 MED ORDER — METOPROLOL TARTRATE 5 MG/5ML IV SOLN
INTRAVENOUS | Status: AC
  Filled 2023-02-09: qty 5

## 2023-02-09 MED ORDER — CHLORHEXIDINE GLUCONATE 0.12 % MT SOLN
15.0000 mL | Freq: Once | OROMUCOSAL | Status: AC
  Administered 2023-02-09: 15 mL via OROMUCOSAL
  Filled 2023-02-09: qty 15

## 2023-02-09 MED ORDER — HYDROCODONE-ACETAMINOPHEN 5-325 MG PO TABS
1.0000 | ORAL_TABLET | ORAL | Status: DC | PRN
  Administered 2023-02-09: 1 via ORAL

## 2023-02-09 MED ORDER — METHOCARBAMOL 1000 MG/10ML IJ SOLN
500.0000 mg | Freq: Four times a day (QID) | INTRAMUSCULAR | Status: DC | PRN
  Administered 2023-02-09: 500 mg via INTRAVENOUS

## 2023-02-09 MED ORDER — BACITRACIN ZINC 500 UNIT/GM EX OINT
TOPICAL_OINTMENT | CUTANEOUS | Status: AC
  Filled 2023-02-09: qty 28.35

## 2023-02-09 MED ORDER — SODIUM CHLORIDE 0.9% FLUSH
3.0000 mL | Freq: Two times a day (BID) | INTRAVENOUS | Status: DC
Start: 2023-02-09 — End: 2023-02-10
  Administered 2023-02-09: 3 mL via INTRAVENOUS

## 2023-02-09 MED ORDER — METOPROLOL TARTRATE 5 MG/5ML IV SOLN
INTRAVENOUS | Status: DC | PRN
  Administered 2023-02-09: 2 mg via INTRAVENOUS

## 2023-02-09 MED ORDER — INSULIN ASPART 100 UNIT/ML IJ SOLN: 0.0000 [IU] | INTRAMUSCULAR | Status: DC | PRN

## 2023-02-09 MED ORDER — DEXAMETHASONE SODIUM PHOSPHATE 10 MG/ML IJ SOLN
INTRAMUSCULAR | Status: AC
  Filled 2023-02-09: qty 1

## 2023-02-09 MED ORDER — BUPIVACAINE HCL (PF) 0.25 % IJ SOLN
INTRAMUSCULAR | Status: DC | PRN
  Administered 2023-02-09: 20 mL

## 2023-02-09 MED ORDER — LACTATED RINGERS IV SOLN: INTRAVENOUS | Status: DC

## 2023-02-09 MED ORDER — BACITRACIN ZINC 500 UNIT/GM EX OINT
TOPICAL_OINTMENT | CUTANEOUS | Status: DC | PRN
  Administered 2023-02-09: 1 via TOPICAL

## 2023-02-09 MED ORDER — HYDRALAZINE HCL 20 MG/ML IJ SOLN
5.0000 mg | Freq: Once | INTRAMUSCULAR | Status: AC
  Administered 2023-02-09: 5 mg via INTRAVENOUS

## 2023-02-09 MED ORDER — CEFAZOLIN SODIUM-DEXTROSE 1-4 GM/50ML-% IV SOLN
1.0000 g | Freq: Three times a day (TID) | INTRAVENOUS | Status: AC
  Administered 2023-02-09 – 2023-02-10 (×2): 1 g via INTRAVENOUS
  Filled 2023-02-09 (×2): qty 50

## 2023-02-09 SURGICAL SUPPLY — 40 items
BAG COUNTER SPONGE SURGICOUNT (BAG) ×1 IMPLANT
BENZOIN TINCTURE PRP APPL 2/3 (GAUZE/BANDAGES/DRESSINGS) ×1 IMPLANT
BLADE CLIPPER SURG (BLADE) IMPLANT
BUR MATCHSTICK NEURO 3.0 LAGG (BURR) ×1 IMPLANT
CANISTER SUCT 3000ML PPV (MISCELLANEOUS) ×1 IMPLANT
DERMABOND ADVANCED .7 DNX12 (GAUZE/BANDAGES/DRESSINGS) ×1 IMPLANT
DRAPE LAPAROTOMY 100X72 PEDS (DRAPES) ×1 IMPLANT
DRAPE MICROSCOPE SLANT 54X150 (MISCELLANEOUS) IMPLANT
DRSG OPSITE POSTOP 4X6 (GAUZE/BANDAGES/DRESSINGS) IMPLANT
DURAPREP 26ML APPLICATOR (WOUND CARE) ×1 IMPLANT
ELECT REM PT RETURN 9FT ADLT (ELECTROSURGICAL) ×1
ELECTRODE REM PT RTRN 9FT ADLT (ELECTROSURGICAL) ×1 IMPLANT
EVACUATOR 1/8 PVC DRAIN (DRAIN) IMPLANT
GAUZE 4X4 16PLY ~~LOC~~+RFID DBL (SPONGE) IMPLANT
GAUZE SPONGE 4X4 12PLY STRL (GAUZE/BANDAGES/DRESSINGS) ×1 IMPLANT
GLOVE ECLIPSE 9.0 STRL (GLOVE) ×1 IMPLANT
GLOVE EXAM NITRILE XL STR (GLOVE) IMPLANT
GOWN STRL REUS W/ TWL LRG LVL3 (GOWN DISPOSABLE) IMPLANT
GOWN STRL REUS W/ TWL XL LVL3 (GOWN DISPOSABLE) ×1 IMPLANT
GOWN STRL REUS W/TWL 2XL LVL3 (GOWN DISPOSABLE) IMPLANT
KIT BASIN OR (CUSTOM PROCEDURE TRAY) ×1 IMPLANT
KIT TURNOVER KIT B (KITS) ×1 IMPLANT
NDL HYPO 22X1.5 SAFETY MO (MISCELLANEOUS) ×1 IMPLANT
NDL SPNL 22GX3.5 QUINCKE BK (NEEDLE) ×1 IMPLANT
NEEDLE HYPO 22X1.5 SAFETY MO (MISCELLANEOUS) ×1 IMPLANT
NEEDLE SPNL 22GX3.5 QUINCKE BK (NEEDLE) ×1 IMPLANT
NS IRRIG 1000ML POUR BTL (IV SOLUTION) ×1 IMPLANT
PACK LAMINECTOMY NEURO (CUSTOM PROCEDURE TRAY) ×1 IMPLANT
PAD ARMBOARD 7.5X6 YLW CONV (MISCELLANEOUS) ×3 IMPLANT
PIN MAYFIELD SKULL DISP (PIN) ×1 IMPLANT
SPONGE SURGIFOAM ABS GEL 100 (HEMOSTASIS) IMPLANT
SPONGE SURGIFOAM ABS GEL SZ50 (HEMOSTASIS) ×1 IMPLANT
SPONGE T-LAP 4X18 ~~LOC~~+RFID (SPONGE) IMPLANT
STRIP CLOSURE SKIN 1/2X4 (GAUZE/BANDAGES/DRESSINGS) ×1 IMPLANT
SUT VIC AB 0 CT1 18XCR BRD8 (SUTURE) ×1 IMPLANT
SUT VIC AB 2-0 CT1 18 (SUTURE) ×1 IMPLANT
SUT VIC AB 3-0 SH 8-18 (SUTURE) ×1 IMPLANT
TOWEL GREEN STERILE (TOWEL DISPOSABLE) ×1 IMPLANT
TOWEL GREEN STERILE FF (TOWEL DISPOSABLE) ×1 IMPLANT
WATER STERILE IRR 1000ML POUR (IV SOLUTION) ×1 IMPLANT

## 2023-02-09 NOTE — Transfer of Care (Signed)
Immediate Anesthesia Transfer of Care Note  Patient: Damon Acosta  Procedure(s) Performed: Laminectomy  Cervical two - Cervical seven (Spine Cervical)  Patient Location: PACU  Anesthesia Type:General  Level of Consciousness: awake  Airway & Oxygen Therapy: Patient Spontanous Breathing  Post-op Assessment: Report given to RN and Post -op Vital signs reviewed and stable  Post vital signs: Reviewed and stable  Last Vitals:  Vitals Value Taken Time  BP 154/96 02/09/23 1315  Temp 36.1 C 02/09/23 1315  Pulse 64 02/09/23 1323  Resp 21 02/09/23 1323  SpO2 96 % 02/09/23 1323  Vitals shown include unfiled device data.  Last Pain:  Vitals:   02/09/23 1315  TempSrc:   PainSc: 7       Patients Stated Pain Goal: 3 (02/09/23 1315)  Complications: No notable events documented.

## 2023-02-09 NOTE — Brief Op Note (Signed)
02/09/2023  12:58 PM  PATIENT:  Damon Acosta  82 y.o. male  PRE-OPERATIVE DIAGNOSIS:  Stenosis  POST-OPERATIVE DIAGNOSIS:  Stenosis  PROCEDURE:  Procedure(s): Laminectomy  Cervical two - Cervical seven (N/A)  SURGEON:  Surgeons and Role:    Julio Sicks, MD - Primary  PHYSICIAN ASSISTANT:   ASSISTANTSMarland Mcalpine   ANESTHESIA:   general  EBL:  150 mL   BLOOD ADMINISTERED:none  DRAINS: (med ) Hemovact drain(s) in the deep wound space with  Suction Open   LOCAL MEDICATIONS USED:  MARCAINE     SPECIMEN:  No Specimen  DISPOSITION OF SPECIMEN:  N/A  COUNTS:  YES  TOURNIQUET:  * No tourniquets in log *  DICTATION: .Dragon Dictation  PLAN OF CARE: Admit to inpatient   PATIENT DISPOSITION:  PACU - hemodynamically stable.   Delay start of Pharmacological VTE agent (>24hrs) due to surgical blood loss or risk of bleeding: yes

## 2023-02-09 NOTE — Anesthesia Postprocedure Evaluation (Signed)
Anesthesia Post Note  Patient: Damon Acosta  Procedure(s) Performed: Laminectomy  Cervical two - Cervical seven (Spine Cervical)     Patient location during evaluation: Other Anesthesia Type: General Level of consciousness: awake and alert Pain management: pain level controlled Vital Signs Assessment: post-procedure vital signs reviewed and stable Respiratory status: spontaneous breathing, nonlabored ventilation and respiratory function stable Cardiovascular status: blood pressure returned to baseline and stable Postop Assessment: no apparent nausea or vomiting Anesthetic complications: no  No notable events documented.  Last Vitals:  Vitals:   02/09/23 1515 02/09/23 1622  BP: (!) 185/103 (!) 193/83  Pulse: 63 63  Resp: 20 16  Temp:  36.7 C  SpO2: 99%     Last Pain:  Vitals:   02/09/23 1524  TempSrc:   PainSc: 6                  Zani Kyllonen,W. EDMOND

## 2023-02-09 NOTE — Anesthesia Procedure Notes (Signed)
Procedure Name: Intubation Date/Time: 02/09/2023 11:27 AM  Performed by: Alwyn Ren, CRNAPre-anesthesia Checklist: Patient identified, Emergency Drugs available, Suction available and Patient being monitored Patient Re-evaluated:Patient Re-evaluated prior to induction Oxygen Delivery Method: Circle system utilized Preoxygenation: Pre-oxygenation with 100% oxygen Induction Type: IV induction Ventilation: Mask ventilation without difficulty Laryngoscope Size: Glidescope and 3 Grade View: Grade I Tube type: Oral Tube size: 7.0 mm Number of attempts: 1 Airway Equipment and Method: Stylet and Oral airway Placement Confirmation: ETT inserted through vocal cords under direct vision, positive ETCO2 and breath sounds checked- equal and bilateral Secured at: 22 cm Tube secured with: Tape Dental Injury: Teeth and Oropharynx as per pre-operative assessment  Comments: Pt symptomatic as head lowered. Dr. Sampson Goon with glidescope intubation x 1 with position of comfort for induction/intubation.

## 2023-02-09 NOTE — Progress Notes (Signed)
Orthopedic Tech Progress Note Patient Details:  Damon Acosta 1940/12/16 [id number]  PACU RN called requesting me to drop off soft collar to room.   Ortho Devices Type of Ortho Device: Soft collar Ortho Device/Splint Location: NECK Ortho Device/Splint Interventions: Ordered, Other (comment)   Post Interventions Patient Tolerated: Well Instructions Provided: Care of device  [name] 02/09/2023, 4:01 PM

## 2023-02-09 NOTE — Plan of Care (Signed)

## 2023-02-09 NOTE — Op Note (Signed)
Date of procedure: 02/09/2023  Date of dictation: Same  Service: Neurosurgery  Preoperative diagnosis: Cervical stenosis with myelopathy  Postoperative diagnosis: Same  Procedure Name: Partial C2, complete C3, C4, C5, C6 and partial C7 decompressive laminectomy with foraminotomies  Surgeon:Gerline Ratto A.Jamarkus Lisbon, M.D.  Asst. Surgeon: Doran Durand, NP  Anesthesia: General  Indication: 82 year old male with neck pain with bilateral upper extremity numbness weakness and dysesthesias consistent with a progressive cervical myelopathy.  Workup demonstrates evidence of severe multilevel cervical spondylosis with severe stenosis and ongoing marked cord compression throughout his cervical spine.  Patient without evidence of deformity or instability.  He presents now for multilevel posterior decompressive surgery.  Operative note: After induction of anesthesia, patient positioned prone onto bolsters with his head fixed in a neutral head position with a Mayfield pin headrest.  Patient's posterior cervical region was prepped and draped sterilely.  Incision was made from C2-C7.  Dissection performed bilaterally.  Retractor placed.  X-ray taken.  Level confirmed.  Decompressive laminectomy was then performed using Leksell rongeurs, Kerrison rongeurs and the high-speed drill to remove the inferior one third of the lamina of C2 the entire lamina of C3, C4, C5, C6 and the superior one half of the lamina of C7.  Ligament flavum elevated and resected.  Proximal foraminotomies complete on the course the exiting C4-C5-C6 and C7 nerve roots.  At this point a very thorough decompression of been achieved.  There was no evidence of injury to the thecal sac or nerve roots.  The wound was irrigated.  A medium Hemovac drain was left in the deep wound space.  Vancomycin powder is placed the deep wound space.  Wounds then closed in layers with Vicryl sutures.  Steri-Strips and sterile dressing were applied.  No apparent complications.   Patient tolerated the procedure well and he returns to the recovery room postop.

## 2023-02-09 NOTE — H&P (Signed)
Damon Acosta is an 82 y.o. male.   Chief Complaint: Weakness HPI: 82 year old male with progressive quadriparesis.  Workup demonstrates evidence of critical multilevel cervical spondylosis with stenosis and high-grade cord compression worse at C3-4 C4-5 and C5-6 but also present at C2-3 and C6-7.  Patient without evidence of instability or obvious deformity.  Plan for C2-C7 decompressive laminectomy in hopes of improving his symptoms.  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   CKD (chronic kidney disease)    Complication of anesthesia    patient had dificulty waking up   Coronary artery disease    Diabetes mellitus without complication ([name])    Gout    Hyperlipidemia    Hypertension    Myocardial infarction [facility])    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   colonic pseudo-obstruction, s/p colectomy with ileostomy 12/2013   Overactive bladder    Sleep apnea    Stroke [facility])     Past Surgical History:  Procedure Laterality Date   CHOLECYSTECTOMY     COLOSTOMY     PROSTATE SURGERY      Family History  Problem Relation Age of Onset   Diabetes Sister    Social History:  reports that he has quit smoking. He has never used smokeless tobacco. He reports that he does not currently use alcohol. He reports that he does not use drugs.  Allergies:  Allergies  Allergen Reactions   Aspirin Swelling   Doxepin Other (See Comments)    Dry mouth    Iodine    Shellfish Allergy Swelling   Sertraline Diarrhea    Medications Prior to Admission  Medication Sig Dispense Refill   acetaminophen (TYLENOL) 650 MG CR tablet Take 1-2 tablets (650-1,300 mg total) by mouth every 8 (eight) hours as needed for pain. 90 tablet 3   allopurinol (ZYLOPRIM) 100 MG tablet TAKE  1 TABLET BY MOUTH EVERY DAY 90 tablet 3   atorvastatin (LIPITOR) 20 MG tablet Take 1 tablet (20 mg total) by mouth daily. 90 tablet 3   azelastine (ASTELIN) 0.1 % nasal spray Place 1 spray into both nostrils 2 (two) times daily. Use in each nostril as directed 30 mL 12   ELIQUIS 2.5 MG TABS tablet TAKE 1 TABLET BY MOUTH TWICE A DAY 180 tablet 3   escitalopram (LEXAPRO) 10 MG tablet Take 1 tablet (10 mg total) by mouth daily. 90 tablet 1   metoprolol succinate (TOPROL-XL) 50 MG 24 hr tablet TAKE 1 TABLET BY MOUTH EVERY DAY 90 tablet 3   Multiple Vitamin (MULTIVITAMIN WITH MINERALS) TABS tablet Take 1 tablet by mouth daily.     nitroGLYCERIN (NITROSTAT) 0.4 MG SL tablet Place 0.4 mg under the tongue every 5 (five) minutes as needed for chest pain.     oxybutynin (DITROPAN XL) 15 MG 24 hr tablet Take 1 tablet (15 mg total) by mouth at bedtime. 90 tablet 1   pantoprazole (PROTONIX) 40 MG tablet TAKE 1 TABLET BY MOUTH EVERY DAY 90 tablet 1   traMADol (ULTRAM) 50 MG tablet Take 1 tablet (50 mg total) by mouth 3 (three) times daily as needed. 90 tablet 3   traZODone (DESYREL) 50 MG tablet TAKE 1-2 TABLETS BY MOUTH AT BEDTIME AS NEEDED FOR SLEEP. 90 tablet  1   triamcinolone cream (KENALOG) 0.1 % APPLY TO AFFECTED AREA TWICE A DAY 30 g 0   AMBULATORY NON FORMULARY MEDICATION Motorized wheelchair/scooter per patient preference and insurance coverage. Please fax to any medical supply company local to [place]   Dx: (E11.22) Type 2 diabetes mellitus with chronic kidney disease, without long-term current use of insulin, unspecified CKD stage ([name])  (primary encounter diagnosis) (E11.59,  I15.2) Hypertension associated with diabetes ([name]) (I48.0) Paroxysmal atrial fibrillation ([name]) (R26.2) Ambulatory dysfunction (R26.81) Gait instability (M48.062) Spinal stenosis of lumbar region with neurogenic claudication (Z86.73) History of CVA (cerebrovascular accident) (Z93.2) Ileostomy in place ([name]) 1 Units 99    fluticasone (FLONASE) 50 MCG/ACT nasal spray One spray in each nostril twice a day, use left hand for right nostril, and right hand for left nostril. (Patient not taking: Reported on 01/27/2023) 48 g 3   furosemide (LASIX) 20 MG tablet TAKE 1 TABLET BY MOUTH TWICE A DAY (Patient not taking: Reported on 01/27/2023) 180 tablet 1   linagliptin (TRADJENTA) 5 MG TABS tablet Take 1 tablet (5 mg total) by mouth daily. (Patient not taking: Reported on 01/27/2023) 90 tablet 3   methocarbamol (ROBAXIN) 500 MG tablet Take 1 tablet (500 mg total) by mouth 2 (two) times daily as needed for muscle spasms. (Patient not taking: Reported on 01/27/2023) 90 tablet 2   Nystatin POWD Apply liberally to affected area 2 times per day (Patient not taking: Reported on 01/27/2023) 3 each 11   ondansetron (ZOFRAN-ODT) 4 MG disintegrating tablet Take 1 tablet (4 mg total) by mouth every 8 (eight) hours as needed for nausea or vomiting. Dissolve under tongue. (Patient not taking: Reported on 01/27/2023) 15 tablet 0   traZODone (DESYREL) 100 MG tablet TAKE 1-1.5 TABLETS (100-150 MG TOTAL) BY MOUTH AT BEDTIME AS NEEDED FOR SLEEP. (Patient not taking: Reported on 01/27/2023) 90 tablet 1    Results for orders placed or performed during the hospital encounter of 02/09/23 (from the past 48 hours)  Glucose, capillary     Status: None   Collection Time: 02/09/23  9:42 AM  Result Value Ref Range   Glucose-Capillary 96 70 - 99 mg/dL    Comment: Glucose reference range applies only to samples taken after fasting for at least 8 hours.   No results found.  Pertinent items noted in HPI and remainder of comprehensive ROS otherwise negative.  Blood pressure (!) 180/69, pulse 65, temperature 98.1 F (36.7 C), temperature source Oral, resp. rate 18, height 5\' 9"  (1.753 m), weight 79.8 kg, SpO2 96%.  Patient is awake and alert.  He is oriented and appropriate.  His speech is fluent.  His judgment insight are intact.  Cranial nerve  function normal bilateral.  Motor examination reveals weakness of his deltoid motor group bilaterally at 4/5.  He has profound weakness in his biceps triceps grips and intrinsic grading at 4-/5.  He has marked weakness in both lower extremities grading at 4/5 with spasticity.  He is hyperreflexic.  He has patchy distal sensory loss in both upper and lower extremities.  His gait is unsteady.  Examination head ears eyes nose and throat is unremarked.  Chest and abdomen are benign.  Extremities are free from injury deformity. Assessment/Plan C2-C7 spondylosis with stenosis and myelopathy.  Plan C2 3456 and 7 decompressive laminectomies.  Risks and benefits been explained.  Patient wishes proceed.  Damon Acosta 02/09/2023, 10:22 AM

## 2023-02-10 ENCOUNTER — Encounter (HOSPITAL_COMMUNITY): Payer: Self-pay | Admitting: Neurosurgery

## 2023-02-10 ENCOUNTER — Other Ambulatory Visit (HOSPITAL_COMMUNITY): Payer: Self-pay

## 2023-02-10 DIAGNOSIS — M4802 Spinal stenosis, cervical region: Secondary | ICD-10-CM | POA: Diagnosis not present

## 2023-02-10 LAB — GLUCOSE, CAPILLARY
Glucose-Capillary: 119 mg/dL — ABNORMAL HIGH (ref 70–99)
Glucose-Capillary: 88 mg/dL (ref 70–99)

## 2023-02-10 MED ORDER — HYDRALAZINE HCL 20 MG/ML IJ SOLN
10.0000 mg | Freq: Once | INTRAMUSCULAR | Status: AC
  Administered 2023-02-10: 10 mg via INTRAVENOUS
  Filled 2023-02-10: qty 0.5

## 2023-02-10 MED ORDER — METHOCARBAMOL 500 MG PO TABS
500.0000 mg | ORAL_TABLET | Freq: Four times a day (QID) | ORAL | 1 refills | Status: DC | PRN
  Filled 2023-02-10: qty 40, 10d supply, fill #0

## 2023-02-10 MED ORDER — HYDROCODONE-ACETAMINOPHEN 5-325 MG PO TABS
1.0000 | ORAL_TABLET | ORAL | 0 refills | Status: DC | PRN
  Filled 2023-02-10: qty 30, 5d supply, fill #0

## 2023-02-10 NOTE — Progress Notes (Signed)
 PT Cancellation Note  Patient Details Name: Damon Acosta MRN: [id number] DOB: 10-25-1940   Cancelled Treatment:    Reason Eval/Treat Not Completed: PT screened, no needs identified, will sign off. Per OT, pt at baseline mobility and has wife available for assistance as needed at home. Pt also observed walking at modI level in hallway with RW. No further PT acute needs identified at this time, thank you for the consult.   Izetta Call, PT, DPT   Acute Rehabilitation Department Office [phone] Secure Chat Communication Preferred  Izetta JULIANNA Call 02/10/2023, 10:54 AM

## 2023-02-10 NOTE — Care Management CC44 (Signed)
 Condition Code 44 Documentation Completed  Patient Details  Name: Damon Acosta MRN: [id number] Date of Birth: 03/24/1940   Condition Code 44 given:  Yes Patient signature on Condition Code 44 notice:  Yes Documentation of 2 MD's agreement:  Yes Code 44 added to claim:  Yes    Andrez [name], RN 02/10/2023, 9:53 AM

## 2023-02-10 NOTE — Plan of Care (Signed)
  Problem: Education: Goal: Knowledge of General Education information will improve Description: Including pain rating scale, medication(s)/side effects and non-pharmacologic comfort measures Outcome: Completed/Met   Problem: Health Behavior/Discharge Planning: Goal: Ability to manage health-related needs will improve Outcome: Completed/Met   Problem: Clinical Measurements: Goal: Ability to maintain clinical measurements within normal limits will improve Outcome: Completed/Met Goal: Will remain free from infection Outcome: Completed/Met Goal: Diagnostic test results will improve Outcome: Completed/Met Goal: Respiratory complications will improve Outcome: Completed/Met Goal: Cardiovascular complication will be avoided Outcome: Completed/Met   Problem: Activity: Goal: Risk for activity intolerance will decrease Outcome: Completed/Met   Problem: Nutrition: Goal: Adequate nutrition will be maintained Outcome: Completed/Met   Problem: Coping: Goal: Level of anxiety will decrease Outcome: Completed/Met   Problem: Elimination: Goal: Will not experience complications related to bowel motility Outcome: Completed/Met Goal: Will not experience complications related to urinary retention Outcome: Completed/Met   Problem: Pain Management: Goal: General experience of comfort will improve Outcome: Completed/Met   Problem: Safety: Goal: Ability to remain free from injury will improve Outcome: Completed/Met   Problem: Skin Integrity: Goal: Risk for impaired skin integrity will decrease Outcome: Completed/Met   Problem: Education: Goal: Ability to verbalize activity precautions or restrictions will improve Outcome: Completed/Met Goal: Knowledge of the prescribed therapeutic regimen will improve Outcome: Completed/Met Goal: Understanding of discharge needs will improve Outcome: Completed/Met   Problem: Activity: Goal: Ability to avoid complications of mobility impairment will  improve Outcome: Completed/Met Goal: Ability to tolerate increased activity will improve Outcome: Completed/Met Goal: Will remain free from falls Outcome: Completed/Met   Problem: Bowel/Gastric: Goal: Gastrointestinal status for postoperative course will improve Outcome: Completed/Met   Problem: Clinical Measurements: Goal: Ability to maintain clinical measurements within normal limits will improve Outcome: Completed/Met Goal: Postoperative complications will be avoided or minimized Outcome: Completed/Met Goal: Diagnostic test results will improve Outcome: Completed/Met   Problem: Pain Management: Goal: Pain level will decrease Outcome: Completed/Met   Problem: Skin Integrity: Goal: Will show signs of wound healing Outcome: Completed/Met   Problem: Health Behavior/Discharge Planning: Goal: Identification of resources available to assist in meeting health care needs will improve Outcome: Completed/Met   Problem: Bladder/Genitourinary: Goal: Urinary functional status for postoperative course will improve Outcome: Completed/Met

## 2023-02-10 NOTE — Discharge Instructions (Addendum)
 Wound Care Keep incision covered and dry until post op day 3. You may remove the Honeycomb dressing on post op day 3. Leave steri-strips on back of neck.  They will fall off by themselves. Do not put any creams, lotions, or ointments on incision. You are fine to shower. Let water run over incision and pat dry.   Activity Walk each and every day, increasing distance each day. No lifting greater than 8 lbs.  No lifting no bending no twisting no driving or riding a car unless coming back and forth to see the doctor.   Diet Resume your normal diet.     Call Your Doctor If Any of These Occur Redness, drainage, or swelling at the wound.  Temperature greater than 101 degrees. Severe pain not relieved by pain medication. Incision starts to come apart.  Follow Up Appt Call [phone] if you have one or any problem.    Restart Eliquis  on Sunday

## 2023-02-10 NOTE — Evaluation (Signed)
 Occupational Therapy Evaluation Patient Details Name: Damon Acosta MRN: [id number] DOB: 02-Aug-1940 Today's Date: 02/10/2023   History of Present Illness 82 yo M s/p C2-C7 decompressive laminectomy.  PMH: Anxiety, Arthritis, Atrial flutter, Cancer, Carotid artery disease, Chronic intestinal pseudo-obstruction, CKD, Coronary artery disease, Diabetes mellitus, Gout, Hyperlipidemia, Hypertension, Myocardial infarction, Ogilvie's syndrome, Overactive bladder, Sleep apnea, Stroke.   Clinical Impression   Patient admitted for the procedure above.  PTA patient lives at home with his spouse, who will assist as needed with lower body ADL.  Patient uses either upright walker or transport chair at home.  He has baseline unsteadiness from cervical compression and prior CVA.  He is close to his baseline for ADL and hallway mobility.  Patient does have baseline memory deficits, and will need reinforcement of cervical precautions.  No further acute OT needs, and recommend follow up as prescribed by MD.         If plan is discharge home, recommend the following: Assist for transportation;Assistance with cooking/housework    Functional Status Assessment  Patient has not had a recent decline in their functional status  Equipment Recommendations  None recommended by OT    Recommendations for Other Services       Precautions / Restrictions Precautions Precautions: Cervical Precaution Booklet Issued: Yes (comment) Required Braces or Orthoses: Cervical Brace Cervical Brace: For comfort;Soft collar Restrictions Weight Bearing Restrictions Per Provider Order: No      Mobility Bed Mobility Overal bed mobility: Modified Independent             General bed mobility comments: cues    Transfers Overall transfer level: Needs assistance Equipment used: Rolling walker (2 wheels) Transfers: Sit to/from Stand, Bed to chair/wheelchair/BSC Sit to Stand: Supervision     Step pivot transfers:  Supervision            Balance Overall balance assessment: Needs assistance Sitting-balance support: Feet supported Sitting balance-Leahy Scale: Good     Standing balance support: Reliant on assistive device for balance Standing balance-Leahy Scale: Fair                             ADL either performed or assessed with clinical judgement   ADL Overall ADL's : At baseline                                       General ADL Comments: Min A with shoes and socks.  Mild unsteadness.     Vision Baseline Vision/History: 0 No visual deficits       Perception Perception: Not tested       Praxis Praxis: Not tested       Pertinent Vitals/Pain Pain Assessment Pain Assessment: Faces Faces Pain Scale: Hurts little more Pain Location: surgical Pain Descriptors / Indicators: Aching, Sore Pain Intervention(s): Monitored during session     Extremity/Trunk Assessment Upper Extremity Assessment Upper Extremity Assessment: RUE deficits/detail;LUE deficits/detail RUE Deficits / Details: weakness with mild incoordination RUE Sensation: decreased light touch RUE Coordination: decreased fine motor LUE Deficits / Details: weakness with mild incoordination LUE Sensation: decreased light touch LUE Coordination: decreased fine motor   Lower Extremity Assessment Lower Extremity Assessment: Defer to PT evaluation   Cervical / Trunk Assessment Cervical / Trunk Assessment: Kyphotic;Neck Surgery   Communication Communication Communication: No apparent difficulties   Cognition Arousal: Alert Behavior During Therapy: [facility] for  tasks assessed/performed Overall Cognitive Status: History of cognitive impairments - at baseline                                 General Comments: Poor ST Memory and decreased safety.     General Comments   VSS               Home Living Family/patient expects to be discharged to:: Private residence Living  Arrangements: Spouse/significant other Available Help at Discharge: Family;Available 24 hours/day Type of Home: House Home Access: Stairs to enter [employer] of Steps: 1   Home Layout: One level     Bathroom Shower/Tub: Producer, Television/film/video: Standard Bathroom Accessibility: Yes How Accessible: Accessible via walker Home Equipment: Rolling Walker (2 wheels);Rollator (4 wheels);BSC/3in1;Shower seat;Transport chair;Wheelchair - manual          Prior Functioning/Environment Prior Level of Function : Needs assist       Physical Assist : ADLs (physical)   ADLs (physical): Dressing Mobility Comments: Uses an upright RW with occasional transport chair depeding on steadiness. ADLs Comments: Occasional Min A with socks and shoes        OT Problem List: Pain      OT Treatment/Interventions:      OT Goals(Current goals can be found in the care plan section) Acute Rehab OT Goals Patient Stated Goal: Return home OT Goal Formulation: With patient Time For Goal Achievement: 02/13/23 Potential to Achieve Goals: Good  OT Frequency:      Co-evaluation              AM-PAC OT 6 Clicks Daily Activity     Outcome Measure Help from another person eating meals?: None Help from another person taking care of personal grooming?: None Help from another person toileting, which includes using toliet, bedpan, or urinal?: A Little Help from another person bathing (including washing, rinsing, drying)?: A Little Help from another person to put on and taking off regular upper body clothing?: None Help from another person to put on and taking off regular lower body clothing?: A Little 6 Click Score: 21   End of Session Equipment Utilized During Treatment: Rolling walker (2 wheels) Nurse Communication: Mobility status  Activity Tolerance: Patient tolerated treatment well Patient left: in chair;with call bell/phone within reach  OT Visit Diagnosis: Unsteadiness  on feet (R26.81)                Time: 9154-9091 OT Time Calculation (min): 23 min Charges:  OT General Charges $OT Visit: 1 Visit OT Evaluation $OT Eval Moderate Complexity: 1 Mod OT Treatments $Self Care/Home Management : 8-22 mins  02/10/2023  RP, OTR/L  Acute Rehabilitation Services  Office:  [phone]   Charlie JONETTA Halsted 02/10/2023, 9:19 AM

## 2023-02-10 NOTE — Progress Notes (Signed)
 Patient alert and oriented, voiding adequately, skin clean, dry and intact without evidence of skin break down, or symptoms of complications - no redness or edema noted, only slight tenderness at site.  Patient states pain is manageable at time of discharge. Room was checked and accounted for all patient's belongings; discharge instructions concerning her medications, incision care, follow up appointment and when to call the doctor as needed were all discussed with patient by RN and Patient and spouse expressed understanding on the instructions given.

## 2023-02-10 NOTE — Discharge Summary (Signed)
 Physician Discharge Summary  Patient ID: Damon Acosta MRN: [id number] DOB/AGE: 83-20-1942 83 y.o.  Admit date: 02/09/2023 Discharge date: 02/10/2023  Admission Diagnoses:  Discharge Diagnoses:  Principal Problem:   Cervical spondylosis with myelopathy and radiculopathy   Discharged Condition: good  Hospital Course: Patient admitted to hospital where underwent uncomplicated multilevel cervical decompressive laminectomy for treatment of his severe compressive stenosis with myelopathy.  Postoperatively doing well.  Neck pain well-controlled.  Upper extremity symptoms improving.  Standing ambulating and voiding without difficulty.  Ready for discharge home.  Consults:   Significant Diagnostic Studies:  Treatments:   Discharge Exam: Blood pressure (!) 110/53, pulse 70, temperature 97.8 F (36.6 C), temperature source Oral, resp. rate 20, height 5' 9 (1.753 m), weight 79.8 kg, SpO2 99%. Awake and alert.  Oriented and appropriate.  Motor and sensory function stable.  Wound clean and dry.  Chest and abdomen benign.  Disposition: Discharge disposition: 01-Home or Self Care        Allergies as of 02/10/2023       Reactions   Aspirin Swelling   Doxepin  Other (See Comments)   Dry mouth   Iodine    Shellfish Allergy Swelling   Sertraline  Diarrhea        Medication List     TAKE these medications    acetaminophen  650 MG CR tablet Commonly known as: TYLENOL  Take 1-2 tablets (650-1,300 mg total) by mouth every 8 (eight) hours as needed for pain.   allopurinol  100 MG tablet Commonly known as: ZYLOPRIM  TAKE 1 TABLET BY MOUTH EVERY DAY   AMBULATORY NON FORMULARY MEDICATION Motorized wheelchair/scooter per patient preference and insurance coverage. Please fax to any medical supply company local to [place]   Dx: (E11.22) Type 2 diabetes mellitus with chronic kidney disease, without long-term current use of insulin , unspecified CKD stage ([name])  (primary encounter  diagnosis) (E11.59,  I15.2) Hypertension associated with diabetes ([name]) (I48.0) Paroxysmal atrial fibrillation ([name]) (R26.2) Ambulatory dysfunction (R26.81) Gait instability (M48.062) Spinal stenosis of lumbar region with neurogenic claudication (Z86.73) History of CVA (cerebrovascular accident) (Z93.2) Ileostomy in place ([name])   atorvastatin  20 MG tablet Commonly known as: LIPITOR Take 1 tablet (20 mg total) by mouth daily.   azelastine  0.1 % nasal spray Commonly known as: ASTELIN  Place 1 spray into both nostrils 2 (two) times daily. Use in each nostril as directed   Eliquis  2.5 MG Tabs tablet Generic drug: apixaban  TAKE 1 TABLET BY MOUTH TWICE A DAY   escitalopram  10 MG tablet Commonly known as: LEXAPRO  Take 1 tablet (10 mg total) by mouth daily.   fluticasone  50 MCG/ACT nasal spray Commonly known as: Flonase  One spray in each nostril twice a day, use left hand for right nostril, and right hand for left nostril.   furosemide  20 MG tablet Commonly known as: LASIX  TAKE 1 TABLET BY MOUTH TWICE A DAY   HYDROcodone -acetaminophen  5-325 MG tablet Commonly known as: NORCO/VICODIN Take 1 tablet by mouth every 4 (four) hours as needed for moderate pain (pain score 4-6) ((score 4 to 6)).   linagliptin  5 MG Tabs tablet Commonly known as: Tradjenta  Take 1 tablet (5 mg total) by mouth daily.   methocarbamol  500 MG tablet Commonly known as: ROBAXIN  Take 1 tablet (500 mg total) by mouth 2 (two) times daily as needed for muscle spasms. What changed: Another medication with the same name was added. Make sure you understand how and when to take each.   methocarbamol  500 MG tablet Commonly known as: ROBAXIN  Take  1 tablet (500 mg total) by mouth every 6 (six) hours as needed for muscle spasms. What changed: You were already taking a medication with the same name, and this prescription was added. Make sure you understand how and when to take each.   metoprolol  succinate 50 MG 24 hr  tablet Commonly known as: TOPROL -XL TAKE 1 TABLET BY MOUTH EVERY DAY   multivitamin with minerals Tabs tablet Take 1 tablet by mouth daily.   nitroGLYCERIN  0.4 MG SL tablet Commonly known as: NITROSTAT  Place 0.4 mg under the tongue every 5 (five) minutes as needed for chest pain.   Nystatin  Powd Apply liberally to affected area 2 times per day   ondansetron  4 MG disintegrating tablet Commonly known as: ZOFRAN -ODT Take 1 tablet (4 mg total) by mouth every 8 (eight) hours as needed for nausea or vomiting. Dissolve under tongue.   oxybutynin  15 MG 24 hr tablet Commonly known as: DITROPAN  XL Take 1 tablet (15 mg total) by mouth at bedtime.   pantoprazole  40 MG tablet Commonly known as: PROTONIX  TAKE 1 TABLET BY MOUTH EVERY DAY   traMADol  50 MG tablet Commonly known as: ULTRAM  Take 1 tablet (50 mg total) by mouth 3 (three) times daily as needed.   traZODone  50 MG tablet Commonly known as: DESYREL  TAKE 1-2 TABLETS BY MOUTH AT BEDTIME AS NEEDED FOR SLEEP.   traZODone  100 MG tablet Commonly known as: DESYREL  TAKE 1-1.5 TABLETS (100-150 MG TOTAL) BY MOUTH AT BEDTIME AS NEEDED FOR SLEEP.   triamcinolone  cream 0.1 % Commonly known as: KENALOG  APPLY TO AFFECTED AREA TWICE A DAY         Signed: [facility] [employer] Andi Layfield 02/10/2023, 10:23 AM

## 2023-02-10 NOTE — Care Management Obs Status (Signed)
 MEDICARE OBSERVATION STATUS NOTIFICATION   Patient Details  Name: Damon Acosta MRN: [id number] Date of Birth: May 04, 1940   Medicare Observation Status Notification Given:  Yes    Kermit Balo, RN 02/10/2023, 9:53 AM

## 2023-02-11 DIAGNOSIS — I129 Hypertensive chronic kidney disease with stage 1 through stage 4 chronic kidney disease, or unspecified chronic kidney disease: Secondary | ICD-10-CM | POA: Diagnosis not present

## 2023-02-11 DIAGNOSIS — Z87891 Personal history of nicotine dependence: Secondary | ICD-10-CM | POA: Diagnosis not present

## 2023-02-11 DIAGNOSIS — I1A0 Resistant hypertension: Secondary | ICD-10-CM | POA: Diagnosis not present

## 2023-02-11 DIAGNOSIS — S0990XA Unspecified injury of head, initial encounter: Secondary | ICD-10-CM | POA: Diagnosis not present

## 2023-02-11 DIAGNOSIS — Z8673 Personal history of transient ischemic attack (TIA), and cerebral infarction without residual deficits: Secondary | ICD-10-CM | POA: Diagnosis not present

## 2023-02-11 DIAGNOSIS — M25511 Pain in right shoulder: Secondary | ICD-10-CM | POA: Diagnosis not present

## 2023-02-11 DIAGNOSIS — Z9889 Other specified postprocedural states: Secondary | ICD-10-CM | POA: Diagnosis not present

## 2023-02-11 DIAGNOSIS — M542 Cervicalgia: Secondary | ICD-10-CM | POA: Diagnosis not present

## 2023-02-11 DIAGNOSIS — I4891 Unspecified atrial fibrillation: Secondary | ICD-10-CM | POA: Diagnosis not present

## 2023-02-11 DIAGNOSIS — R42 Dizziness and giddiness: Secondary | ICD-10-CM | POA: Diagnosis not present

## 2023-02-11 DIAGNOSIS — Z7984 Long term (current) use of oral hypoglycemic drugs: Secondary | ICD-10-CM | POA: Diagnosis not present

## 2023-02-11 DIAGNOSIS — R55 Syncope and collapse: Secondary | ICD-10-CM | POA: Diagnosis not present

## 2023-02-11 DIAGNOSIS — S199XXA Unspecified injury of neck, initial encounter: Secondary | ICD-10-CM | POA: Diagnosis not present

## 2023-02-11 DIAGNOSIS — N183 Chronic kidney disease, stage 3 unspecified: Secondary | ICD-10-CM | POA: Diagnosis not present

## 2023-02-11 DIAGNOSIS — Z7901 Long term (current) use of anticoagulants: Secondary | ICD-10-CM | POA: Diagnosis not present

## 2023-02-11 DIAGNOSIS — E1122 Type 2 diabetes mellitus with diabetic chronic kidney disease: Secondary | ICD-10-CM | POA: Diagnosis not present

## 2023-02-11 DIAGNOSIS — S0001XA Abrasion of scalp, initial encounter: Secondary | ICD-10-CM | POA: Diagnosis not present

## 2023-02-11 DIAGNOSIS — Z043 Encounter for examination and observation following other accident: Secondary | ICD-10-CM | POA: Diagnosis not present

## 2023-02-11 DIAGNOSIS — S098XXA Other specified injuries of head, initial encounter: Secondary | ICD-10-CM | POA: Diagnosis not present

## 2023-02-11 DIAGNOSIS — S0101XA Laceration without foreign body of scalp, initial encounter: Secondary | ICD-10-CM | POA: Diagnosis not present

## 2023-02-11 DIAGNOSIS — Z933 Colostomy status: Secondary | ICD-10-CM | POA: Diagnosis not present

## 2023-02-12 ENCOUNTER — Telehealth: Payer: Self-pay

## 2023-02-12 NOTE — Telephone Encounter (Signed)
 Copied from CRM [id number]. Topic: Clinical - Medication Question >> Feb 12, 2023 10:23 AM Damon Acosta wrote: Reason for CRM: Patient had surgery on 02/09/2023 with Dr.Henry Pool on his spine. Dr.Pool recommended that patient start taking Lasix ;however, advised that Dr.Matthews place the order.

## 2023-02-13 DIAGNOSIS — R1310 Dysphagia, unspecified: Secondary | ICD-10-CM | POA: Diagnosis not present

## 2023-02-13 DIAGNOSIS — Z886 Allergy status to analgesic agent status: Secondary | ICD-10-CM | POA: Diagnosis not present

## 2023-02-13 DIAGNOSIS — M549 Dorsalgia, unspecified: Secondary | ICD-10-CM | POA: Diagnosis not present

## 2023-02-13 DIAGNOSIS — Z79899 Other long term (current) drug therapy: Secondary | ICD-10-CM | POA: Diagnosis not present

## 2023-02-13 DIAGNOSIS — Z91013 Allergy to seafood: Secondary | ICD-10-CM | POA: Diagnosis not present

## 2023-02-13 DIAGNOSIS — N183 Chronic kidney disease, stage 3 unspecified: Secondary | ICD-10-CM | POA: Diagnosis not present

## 2023-02-13 DIAGNOSIS — S0990XA Unspecified injury of head, initial encounter: Secondary | ICD-10-CM | POA: Diagnosis not present

## 2023-02-13 DIAGNOSIS — R531 Weakness: Secondary | ICD-10-CM | POA: Diagnosis not present

## 2023-02-13 DIAGNOSIS — I129 Hypertensive chronic kidney disease with stage 1 through stage 4 chronic kidney disease, or unspecified chronic kidney disease: Secondary | ICD-10-CM | POA: Diagnosis not present

## 2023-02-13 DIAGNOSIS — Z7901 Long term (current) use of anticoagulants: Secondary | ICD-10-CM | POA: Diagnosis not present

## 2023-02-13 DIAGNOSIS — G959 Disease of spinal cord, unspecified: Secondary | ICD-10-CM | POA: Diagnosis not present

## 2023-02-13 DIAGNOSIS — M542 Cervicalgia: Secondary | ICD-10-CM | POA: Diagnosis not present

## 2023-02-13 DIAGNOSIS — Z87891 Personal history of nicotine dependence: Secondary | ICD-10-CM | POA: Diagnosis not present

## 2023-02-13 DIAGNOSIS — G4730 Sleep apnea, unspecified: Secondary | ICD-10-CM | POA: Diagnosis not present

## 2023-02-13 DIAGNOSIS — G8918 Other acute postprocedural pain: Secondary | ICD-10-CM | POA: Diagnosis not present

## 2023-02-13 DIAGNOSIS — E1122 Type 2 diabetes mellitus with diabetic chronic kidney disease: Secondary | ICD-10-CM | POA: Diagnosis not present

## 2023-02-13 DIAGNOSIS — Z888 Allergy status to other drugs, medicaments and biological substances status: Secondary | ICD-10-CM | POA: Diagnosis not present

## 2023-02-14 DIAGNOSIS — G959 Disease of spinal cord, unspecified: Secondary | ICD-10-CM | POA: Diagnosis not present

## 2023-02-14 DIAGNOSIS — N1831 Chronic kidney disease, stage 3a: Secondary | ICD-10-CM | POA: Diagnosis not present

## 2023-02-14 DIAGNOSIS — G8918 Other acute postprocedural pain: Secondary | ICD-10-CM | POA: Diagnosis not present

## 2023-02-14 DIAGNOSIS — R531 Weakness: Secondary | ICD-10-CM | POA: Diagnosis not present

## 2023-02-14 DIAGNOSIS — I10 Essential (primary) hypertension: Secondary | ICD-10-CM | POA: Diagnosis not present

## 2023-02-14 DIAGNOSIS — E119 Type 2 diabetes mellitus without complications: Secondary | ICD-10-CM | POA: Diagnosis not present

## 2023-02-14 DIAGNOSIS — M542 Cervicalgia: Secondary | ICD-10-CM | POA: Diagnosis not present

## 2023-02-14 DIAGNOSIS — Z932 Ileostomy status: Secondary | ICD-10-CM | POA: Diagnosis not present

## 2023-02-14 DIAGNOSIS — Z8673 Personal history of transient ischemic attack (TIA), and cerebral infarction without residual deficits: Secondary | ICD-10-CM | POA: Diagnosis not present

## 2023-02-14 DIAGNOSIS — I480 Paroxysmal atrial fibrillation: Secondary | ICD-10-CM | POA: Diagnosis not present

## 2023-02-15 DIAGNOSIS — I4891 Unspecified atrial fibrillation: Secondary | ICD-10-CM | POA: Diagnosis not present

## 2023-02-15 DIAGNOSIS — N179 Acute kidney failure, unspecified: Secondary | ICD-10-CM | POA: Diagnosis not present

## 2023-02-15 DIAGNOSIS — R600 Localized edema: Secondary | ICD-10-CM | POA: Diagnosis not present

## 2023-02-15 DIAGNOSIS — E119 Type 2 diabetes mellitus without complications: Secondary | ICD-10-CM | POA: Diagnosis not present

## 2023-02-15 DIAGNOSIS — N1832 Chronic kidney disease, stage 3b: Secondary | ICD-10-CM | POA: Diagnosis not present

## 2023-02-15 DIAGNOSIS — G992 Myelopathy in diseases classified elsewhere: Secondary | ICD-10-CM | POA: Diagnosis not present

## 2023-02-15 DIAGNOSIS — M4802 Spinal stenosis, cervical region: Secondary | ICD-10-CM | POA: Diagnosis not present

## 2023-02-15 DIAGNOSIS — I25118 Atherosclerotic heart disease of native coronary artery with other forms of angina pectoris: Secondary | ICD-10-CM | POA: Diagnosis not present

## 2023-02-15 DIAGNOSIS — G4730 Sleep apnea, unspecified: Secondary | ICD-10-CM | POA: Diagnosis not present

## 2023-02-15 DIAGNOSIS — R531 Weakness: Secondary | ICD-10-CM | POA: Diagnosis not present

## 2023-02-15 DIAGNOSIS — Z8673 Personal history of transient ischemic attack (TIA), and cerebral infarction without residual deficits: Secondary | ICD-10-CM | POA: Diagnosis not present

## 2023-02-15 DIAGNOSIS — R1311 Dysphagia, oral phase: Secondary | ICD-10-CM | POA: Diagnosis not present

## 2023-02-15 DIAGNOSIS — F32A Depression, unspecified: Secondary | ICD-10-CM | POA: Diagnosis not present

## 2023-02-15 DIAGNOSIS — M542 Cervicalgia: Secondary | ICD-10-CM | POA: Diagnosis not present

## 2023-02-15 DIAGNOSIS — Z4789 Encounter for other orthopedic aftercare: Secondary | ICD-10-CM | POA: Diagnosis not present

## 2023-02-15 DIAGNOSIS — Z432 Encounter for attention to ileostomy: Secondary | ICD-10-CM | POA: Diagnosis not present

## 2023-02-15 DIAGNOSIS — E860 Dehydration: Secondary | ICD-10-CM | POA: Diagnosis not present

## 2023-02-15 DIAGNOSIS — E875 Hyperkalemia: Secondary | ICD-10-CM | POA: Diagnosis not present

## 2023-02-15 DIAGNOSIS — D649 Anemia, unspecified: Secondary | ICD-10-CM | POA: Diagnosis not present

## 2023-02-15 DIAGNOSIS — E1122 Type 2 diabetes mellitus with diabetic chronic kidney disease: Secondary | ICD-10-CM | POA: Diagnosis not present

## 2023-02-15 DIAGNOSIS — N1831 Chronic kidney disease, stage 3a: Secondary | ICD-10-CM | POA: Diagnosis not present

## 2023-02-15 DIAGNOSIS — I10 Essential (primary) hypertension: Secondary | ICD-10-CM | POA: Diagnosis not present

## 2023-02-15 DIAGNOSIS — R1310 Dysphagia, unspecified: Secondary | ICD-10-CM | POA: Diagnosis not present

## 2023-02-15 DIAGNOSIS — M109 Gout, unspecified: Secondary | ICD-10-CM | POA: Diagnosis not present

## 2023-02-15 DIAGNOSIS — Z933 Colostomy status: Secondary | ICD-10-CM | POA: Diagnosis not present

## 2023-02-15 DIAGNOSIS — Z8546 Personal history of malignant neoplasm of prostate: Secondary | ICD-10-CM | POA: Diagnosis not present

## 2023-02-15 DIAGNOSIS — G8918 Other acute postprocedural pain: Secondary | ICD-10-CM | POA: Diagnosis not present

## 2023-02-15 DIAGNOSIS — I129 Hypertensive chronic kidney disease with stage 1 through stage 4 chronic kidney disease, or unspecified chronic kidney disease: Secondary | ICD-10-CM | POA: Diagnosis not present

## 2023-02-15 DIAGNOSIS — B3741 Candidal cystitis and urethritis: Secondary | ICD-10-CM | POA: Diagnosis not present

## 2023-02-15 DIAGNOSIS — R7989 Other specified abnormal findings of blood chemistry: Secondary | ICD-10-CM | POA: Diagnosis not present

## 2023-02-15 DIAGNOSIS — F411 Generalized anxiety disorder: Secondary | ICD-10-CM | POA: Diagnosis not present

## 2023-02-15 DIAGNOSIS — N319 Neuromuscular dysfunction of bladder, unspecified: Secondary | ICD-10-CM | POA: Diagnosis not present

## 2023-02-15 DIAGNOSIS — R269 Unspecified abnormalities of gait and mobility: Secondary | ICD-10-CM | POA: Diagnosis not present

## 2023-02-15 DIAGNOSIS — R059 Cough, unspecified: Secondary | ICD-10-CM | POA: Diagnosis not present

## 2023-02-15 DIAGNOSIS — E785 Hyperlipidemia, unspecified: Secondary | ICD-10-CM | POA: Diagnosis not present

## 2023-02-15 DIAGNOSIS — D72829 Elevated white blood cell count, unspecified: Secondary | ICD-10-CM | POA: Diagnosis not present

## 2023-02-15 DIAGNOSIS — Z932 Ileostomy status: Secondary | ICD-10-CM | POA: Diagnosis not present

## 2023-02-15 DIAGNOSIS — N183 Chronic kidney disease, stage 3 unspecified: Secondary | ICD-10-CM | POA: Diagnosis not present

## 2023-02-15 DIAGNOSIS — I480 Paroxysmal atrial fibrillation: Secondary | ICD-10-CM | POA: Diagnosis not present

## 2023-02-16 ENCOUNTER — Telehealth (INDEPENDENT_AMBULATORY_CARE_PROVIDER_SITE_OTHER): Payer: Self-pay | Admitting: Family Medicine

## 2023-02-16 DIAGNOSIS — Z91199 Patient's noncompliance with other medical treatment and regimen due to unspecified reason: Secondary | ICD-10-CM

## 2023-02-16 NOTE — Progress Notes (Signed)
 Contacted patient x 2.  Unable to leave voicemail as voicemail was not set up.  Patient did not logon for virtual visit.

## 2023-02-18 DIAGNOSIS — R059 Cough, unspecified: Secondary | ICD-10-CM | POA: Diagnosis not present

## 2023-02-18 DIAGNOSIS — R1310 Dysphagia, unspecified: Secondary | ICD-10-CM | POA: Diagnosis not present

## 2023-02-21 DIAGNOSIS — N319 Neuromuscular dysfunction of bladder, unspecified: Secondary | ICD-10-CM | POA: Diagnosis not present

## 2023-02-21 DIAGNOSIS — I480 Paroxysmal atrial fibrillation: Secondary | ICD-10-CM | POA: Diagnosis not present

## 2023-02-21 DIAGNOSIS — I129 Hypertensive chronic kidney disease with stage 1 through stage 4 chronic kidney disease, or unspecified chronic kidney disease: Secondary | ICD-10-CM | POA: Diagnosis not present

## 2023-02-21 DIAGNOSIS — M109 Gout, unspecified: Secondary | ICD-10-CM | POA: Diagnosis not present

## 2023-03-02 ENCOUNTER — Ambulatory Visit: Payer: Medicare Other | Admitting: Family Medicine

## 2023-03-04 ENCOUNTER — Other Ambulatory Visit: Payer: Self-pay | Admitting: Family Medicine

## 2023-03-08 ENCOUNTER — Other Ambulatory Visit: Payer: Self-pay | Admitting: Family Medicine

## 2023-03-08 DIAGNOSIS — F332 Major depressive disorder, recurrent severe without psychotic features: Secondary | ICD-10-CM

## 2023-03-09 DIAGNOSIS — E785 Hyperlipidemia, unspecified: Secondary | ICD-10-CM | POA: Diagnosis not present

## 2023-03-09 DIAGNOSIS — N183 Chronic kidney disease, stage 3 unspecified: Secondary | ICD-10-CM | POA: Diagnosis not present

## 2023-03-09 DIAGNOSIS — Z4789 Encounter for other orthopedic aftercare: Secondary | ICD-10-CM | POA: Diagnosis not present

## 2023-03-09 DIAGNOSIS — I480 Paroxysmal atrial fibrillation: Secondary | ICD-10-CM | POA: Diagnosis not present

## 2023-03-09 DIAGNOSIS — G3184 Mild cognitive impairment, so stated: Secondary | ICD-10-CM | POA: Diagnosis not present

## 2023-03-09 DIAGNOSIS — N3281 Overactive bladder: Secondary | ICD-10-CM | POA: Diagnosis not present

## 2023-03-09 DIAGNOSIS — M109 Gout, unspecified: Secondary | ICD-10-CM | POA: Diagnosis not present

## 2023-03-09 DIAGNOSIS — I129 Hypertensive chronic kidney disease with stage 1 through stage 4 chronic kidney disease, or unspecified chronic kidney disease: Secondary | ICD-10-CM | POA: Diagnosis not present

## 2023-03-09 DIAGNOSIS — Z96643 Presence of artificial hip joint, bilateral: Secondary | ICD-10-CM | POA: Diagnosis not present

## 2023-03-09 DIAGNOSIS — G4700 Insomnia, unspecified: Secondary | ICD-10-CM | POA: Diagnosis not present

## 2023-03-09 DIAGNOSIS — G4730 Sleep apnea, unspecified: Secondary | ICD-10-CM | POA: Diagnosis not present

## 2023-03-09 DIAGNOSIS — Z7901 Long term (current) use of anticoagulants: Secondary | ICD-10-CM | POA: Diagnosis not present

## 2023-03-09 DIAGNOSIS — Z933 Colostomy status: Secondary | ICD-10-CM | POA: Diagnosis not present

## 2023-03-09 DIAGNOSIS — E1122 Type 2 diabetes mellitus with diabetic chronic kidney disease: Secondary | ICD-10-CM | POA: Diagnosis not present

## 2023-03-09 DIAGNOSIS — Z85038 Personal history of other malignant neoplasm of large intestine: Secondary | ICD-10-CM | POA: Diagnosis not present

## 2023-03-09 DIAGNOSIS — D509 Iron deficiency anemia, unspecified: Secondary | ICD-10-CM | POA: Diagnosis not present

## 2023-03-09 DIAGNOSIS — Z9049 Acquired absence of other specified parts of digestive tract: Secondary | ICD-10-CM | POA: Diagnosis not present

## 2023-03-09 DIAGNOSIS — F419 Anxiety disorder, unspecified: Secondary | ICD-10-CM | POA: Diagnosis not present

## 2023-03-09 DIAGNOSIS — F32A Depression, unspecified: Secondary | ICD-10-CM | POA: Diagnosis not present

## 2023-03-09 DIAGNOSIS — K5981 Ogilvie syndrome: Secondary | ICD-10-CM | POA: Diagnosis not present

## 2023-03-09 DIAGNOSIS — R1312 Dysphagia, oropharyngeal phase: Secondary | ICD-10-CM | POA: Diagnosis not present

## 2023-03-09 DIAGNOSIS — M1990 Unspecified osteoarthritis, unspecified site: Secondary | ICD-10-CM | POA: Diagnosis not present

## 2023-03-09 DIAGNOSIS — Z8673 Personal history of transient ischemic attack (TIA), and cerebral infarction without residual deficits: Secondary | ICD-10-CM | POA: Diagnosis not present

## 2023-03-10 ENCOUNTER — Telehealth: Payer: Self-pay

## 2023-03-10 DIAGNOSIS — N183 Chronic kidney disease, stage 3 unspecified: Secondary | ICD-10-CM | POA: Diagnosis not present

## 2023-03-10 DIAGNOSIS — I480 Paroxysmal atrial fibrillation: Secondary | ICD-10-CM | POA: Diagnosis not present

## 2023-03-10 DIAGNOSIS — I129 Hypertensive chronic kidney disease with stage 1 through stage 4 chronic kidney disease, or unspecified chronic kidney disease: Secondary | ICD-10-CM | POA: Diagnosis not present

## 2023-03-10 DIAGNOSIS — Z4789 Encounter for other orthopedic aftercare: Secondary | ICD-10-CM | POA: Diagnosis not present

## 2023-03-10 DIAGNOSIS — E1122 Type 2 diabetes mellitus with diabetic chronic kidney disease: Secondary | ICD-10-CM | POA: Diagnosis not present

## 2023-03-10 DIAGNOSIS — N3281 Overactive bladder: Secondary | ICD-10-CM | POA: Diagnosis not present

## 2023-03-10 NOTE — Telephone Encounter (Signed)
Copied from CRM [id number]. Topic: Clinical - Home Health Verbal Orders >> Mar 09, 2023  3:26 PM Nila Nephew wrote: Caller/Agency: [facility] Number: [phone] Service Requested: Physical Therapy Frequency: once a week for five weeks, then once every other week for four weeks  Any new concerns about the patient? No

## 2023-03-11 ENCOUNTER — Telehealth: Payer: Self-pay

## 2023-03-11 NOTE — Telephone Encounter (Signed)
Copied from CRM [id number]. Topic: Clinical - Home Health Verbal Orders >> Mar 11, 2023  2:41 PM Nila Nephew wrote: Caller/Agency: [facility] Callback Number: [phone] Service Requested: Speech Therapy Frequency: Moving Speech Therapy Evalation to Next Week Any new concerns about the patient? No

## 2023-03-11 NOTE — Telephone Encounter (Signed)
Elnita Maxwell is name of speach therapist

## 2023-03-11 NOTE — Telephone Encounter (Signed)
Verbal orders left on [facility] - PT confidential voice mail

## 2023-03-18 ENCOUNTER — Telehealth: Payer: Self-pay

## 2023-03-18 DIAGNOSIS — I480 Paroxysmal atrial fibrillation: Secondary | ICD-10-CM | POA: Diagnosis not present

## 2023-03-18 DIAGNOSIS — E1122 Type 2 diabetes mellitus with diabetic chronic kidney disease: Secondary | ICD-10-CM | POA: Diagnosis not present

## 2023-03-18 DIAGNOSIS — Z4789 Encounter for other orthopedic aftercare: Secondary | ICD-10-CM | POA: Diagnosis not present

## 2023-03-18 DIAGNOSIS — I129 Hypertensive chronic kidney disease with stage 1 through stage 4 chronic kidney disease, or unspecified chronic kidney disease: Secondary | ICD-10-CM | POA: Diagnosis not present

## 2023-03-18 DIAGNOSIS — N3281 Overactive bladder: Secondary | ICD-10-CM | POA: Diagnosis not present

## 2023-03-18 DIAGNOSIS — N183 Chronic kidney disease, stage 3 unspecified: Secondary | ICD-10-CM | POA: Diagnosis not present

## 2023-03-18 NOTE — Telephone Encounter (Signed)
 Copied from CRM [id number]. Topic: Clinical - Home Health Verbal Orders >> Mar 18, 2023  2:52 PM Deleta HERO wrote: Caller/Agency: [facility] Speech Therapist Home [facility] Callback Number: [phone] Service Requested: Speech Therapy Frequency: 5 additional home health therapy visits Any new concerns about the patient? Yes, pt was seen this afternoon and [facility] states he's been having issues with swallowing which is why she is wanting 5 additional speech therapy visits.

## 2023-03-19 DIAGNOSIS — I480 Paroxysmal atrial fibrillation: Secondary | ICD-10-CM | POA: Diagnosis not present

## 2023-03-19 DIAGNOSIS — N183 Chronic kidney disease, stage 3 unspecified: Secondary | ICD-10-CM | POA: Diagnosis not present

## 2023-03-19 DIAGNOSIS — Z4789 Encounter for other orthopedic aftercare: Secondary | ICD-10-CM | POA: Diagnosis not present

## 2023-03-19 DIAGNOSIS — E1122 Type 2 diabetes mellitus with diabetic chronic kidney disease: Secondary | ICD-10-CM | POA: Diagnosis not present

## 2023-03-19 DIAGNOSIS — I129 Hypertensive chronic kidney disease with stage 1 through stage 4 chronic kidney disease, or unspecified chronic kidney disease: Secondary | ICD-10-CM | POA: Diagnosis not present

## 2023-03-19 DIAGNOSIS — N3281 Overactive bladder: Secondary | ICD-10-CM | POA: Diagnosis not present

## 2023-03-19 NOTE — Telephone Encounter (Signed)
 Patient informed.

## 2023-03-19 NOTE — Telephone Encounter (Signed)
 Error below- should be cheryl speech therapist informed of verbal orders approval

## 2023-03-20 DIAGNOSIS — N3281 Overactive bladder: Secondary | ICD-10-CM | POA: Diagnosis not present

## 2023-03-20 DIAGNOSIS — N183 Chronic kidney disease, stage 3 unspecified: Secondary | ICD-10-CM | POA: Diagnosis not present

## 2023-03-20 DIAGNOSIS — I129 Hypertensive chronic kidney disease with stage 1 through stage 4 chronic kidney disease, or unspecified chronic kidney disease: Secondary | ICD-10-CM | POA: Diagnosis not present

## 2023-03-20 DIAGNOSIS — Z4789 Encounter for other orthopedic aftercare: Secondary | ICD-10-CM | POA: Diagnosis not present

## 2023-03-20 DIAGNOSIS — E1122 Type 2 diabetes mellitus with diabetic chronic kidney disease: Secondary | ICD-10-CM | POA: Diagnosis not present

## 2023-03-20 DIAGNOSIS — I480 Paroxysmal atrial fibrillation: Secondary | ICD-10-CM | POA: Diagnosis not present

## 2023-03-23 ENCOUNTER — Ambulatory Visit (INDEPENDENT_AMBULATORY_CARE_PROVIDER_SITE_OTHER): Payer: Medicare Other | Admitting: Family Medicine

## 2023-03-23 ENCOUNTER — Encounter: Payer: Self-pay | Admitting: Family Medicine

## 2023-03-23 VITALS — BP 104/61 | HR 77 | Ht 69.0 in | Wt 178.0 lb

## 2023-03-23 DIAGNOSIS — I480 Paroxysmal atrial fibrillation: Secondary | ICD-10-CM | POA: Diagnosis not present

## 2023-03-23 DIAGNOSIS — E1159 Type 2 diabetes mellitus with other circulatory complications: Secondary | ICD-10-CM

## 2023-03-23 DIAGNOSIS — N183 Chronic kidney disease, stage 3 unspecified: Secondary | ICD-10-CM | POA: Diagnosis not present

## 2023-03-23 DIAGNOSIS — M4722 Other spondylosis with radiculopathy, cervical region: Secondary | ICD-10-CM | POA: Diagnosis not present

## 2023-03-23 DIAGNOSIS — E1122 Type 2 diabetes mellitus with diabetic chronic kidney disease: Secondary | ICD-10-CM | POA: Diagnosis not present

## 2023-03-23 DIAGNOSIS — I129 Hypertensive chronic kidney disease with stage 1 through stage 4 chronic kidney disease, or unspecified chronic kidney disease: Secondary | ICD-10-CM | POA: Diagnosis not present

## 2023-03-23 DIAGNOSIS — I152 Hypertension secondary to endocrine disorders: Secondary | ICD-10-CM

## 2023-03-23 DIAGNOSIS — M4712 Other spondylosis with myelopathy, cervical region: Secondary | ICD-10-CM | POA: Diagnosis not present

## 2023-03-23 DIAGNOSIS — Z4789 Encounter for other orthopedic aftercare: Secondary | ICD-10-CM | POA: Diagnosis not present

## 2023-03-23 DIAGNOSIS — N3281 Overactive bladder: Secondary | ICD-10-CM | POA: Diagnosis not present

## 2023-03-23 MED ORDER — METHOCARBAMOL 500 MG PO TABS: 500.0000 mg | ORAL_TABLET | Freq: Three times a day (TID) | ORAL | 0 refills | Status: DC

## 2023-03-23 MED ORDER — LOSARTAN POTASSIUM 25 MG PO TABS: 25.0000 mg | ORAL_TABLET | Freq: Every day | ORAL | 3 refills | Status: DC

## 2023-03-23 MED ORDER — FUROSEMIDE 20 MG PO TABS: 20.0000 mg | ORAL_TABLET | Freq: Two times a day (BID) | ORAL | 3 refills | Status: AC

## 2023-03-23 MED ORDER — METOPROLOL SUCCINATE ER 25 MG PO TB24: 12.5000 mg | ORAL_TABLET | Freq: Every day | ORAL | 3 refills | Status: DC

## 2023-03-23 MED ORDER — HYDROCODONE-ACETAMINOPHEN 5-325 MG PO TABS: 1.0000 | ORAL_TABLET | Freq: Four times a day (QID) | ORAL | 0 refills | Status: DC | PRN

## 2023-03-23 NOTE — Progress Notes (Signed)
 Damon Acosta - 83 y.o. male MRN [id number]  Date of birth: 04/25/1940  Subjective Chief Complaint  Patient presents with   Medical Management of Chronic Issues    HPI Damon Acosta is an 83 y.o. male here today for follow-up on recent hospitalization.  Recently had cervical laminectomy.  Seen shortly afterwards in the ED due to generalized weakness and fall.  A lot of his weakness was attributed to acute pain and spasm.  He was prescribed pain medication as well as muscle relaxer.  Discharged to inpatient rehab.  Did well with this course.  Still having some pain.  Denies radiation of pain or increasing weakness.  His blood pressure medications were adjusted during his hospitalization.  Losartan  was added at 50 mg.  Toprol  was reduced to 12.5 mg.  He has not been taking Toprol  recently.  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   CKD (chronic kidney disease)    Complication of anesthesia    patient had dificulty waking up   Coronary artery disease    Diabetes mellitus without complication ([name])    Gout    Hyperlipidemia    Hypertension    Myocardial infarction ([name])    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   colonic pseudo-obstruction, s/p colectomy with ileostomy 12/2013   Overactive bladder    Sleep apnea    Stroke [facility])     Past Surgical History:  Procedure Laterality Date   CHOLECYSTECTOMY     COLOSTOMY     POSTERIOR CERVICAL LAMINECTOMY N/A 02/09/2023   Procedure: Laminectomy  Cervical two -  Cervical seven;  Surgeon: Agustina Aldrich, MD;  Location: [facility] OR;  Service: Neurosurgery;  Laterality: N/A;   PROSTATE SURGERY      Social History   Socioeconomic History   Marital status: Married    Spouse name: Not on file   Number of children: Not on file   Years of education: Not on file   Highest education level: Not on file  Occupational History   Not on file  Tobacco Use   Smoking status: Former   Smokeless tobacco: Never  Vaping Use   Vaping status: Never Used  Substance and Sexual Activity   Alcohol use: Not Currently   Drug use: Never   Sexual activity: Not Currently  Other Topics Concern   Not on file  Social History Narrative   Not on file   Social Drivers of Health   Financial Resource Strain: Low Risk  (07/31/2021)   Received from [facility], [facility]   Overall Financial Resource Strain (CARDIA)    Difficulty of Paying Living Expenses: Not hard at all  Food Insecurity: No Food Insecurity (03/27/2021)   Received from [facility], [facility]   Hunger Vital Sign    Worried About Running Out of Food in the Last Year: Never true    Ran Out of Food in the Last Year: Never true  Transportation Needs: No  Transportation Needs (02/14/2023)   Received from [facility]   PRAPARE - Transportation    Lack of Transportation (Medical): No    Lack of Transportation (Non-Medical): No  Physical Activity: Inactive (07/09/2022)   Exercise Vital Sign    Days of Exercise per Week: 0 days    Minutes of Exercise per Session: 0 min  Stress: Stress Concern Present (07/09/2022)   [employer] of Occupational Health - Occupational Stress Questionnaire    Feeling of Stress : To some extent  Social Connections: Unknown (06/09/2021)   Received from [facility], [facility]   Social Network    Social Network: Not on file    Family History  Problem Relation Age of Onset   Diabetes Sister     Health Maintenance  Topic Date Due   Medicare Annual Wellness (AWV)   Never done   DTaP/Tdap/Td (1 - Tdap) Never done   Zoster Vaccines- Shingrix (2 of 2) 05/02/2021   OPHTHALMOLOGY EXAM  02/27/2022   COVID-19 Vaccine (6 - 2024-25 season) 12/23/2022   HEMOGLOBIN A1C  03/19/2023   FOOT EXAM  06/12/2023   Diabetic kidney evaluation - Urine ACR  09/30/2023   Diabetic kidney evaluation - eGFR measurement  01/29/2024   Pneumonia Vaccine 62+ Years old  Completed   INFLUENZA VACCINE  Completed   HPV VACCINES  Aged Out     ----------------------------------------------------------------------------------------------------------------------------------------------------------------------------------------------------------------- Physical Exam BP 104/61 (BP Location: Left Arm, Patient Position: Sitting, Cuff Size: Normal)   Pulse 77   Ht 5\' 9"  (1.753 m)   Wt 178 lb (80.7 kg)   SpO2 100%   BMI 26.29 kg/m   Physical Exam Constitutional:      Appearance: Normal appearance.  HENT:     Head: Normocephalic and atraumatic.  Cardiovascular:     Rate and Rhythm: Normal rate and regular rhythm.  Pulmonary:     Effort: Pulmonary effort is normal.     Breath sounds: Normal breath sounds.  Musculoskeletal:     Cervical back: Neck supple.  Neurological:     Mental Status: He is alert.  Psychiatric:        Mood and Affect: Mood normal.        Behavior: Behavior normal.     ------------------------------------------------------------------------------------------------------------------------------------------------------------------------------------------------------------------- Assessment and Plan  Hypertension associated with diabetes ([name]) Blood pressure is little low.  He is not taking metoprolol  at this time.  I would like for him to add this back on at 12.5 mg daily given his history of coronary artery disease and paroxysmal A-fib.  Reducing losartan  to 25 mg.  Cervical spondylosis with myelopathy and radiculopathy Recent cervical spine surgery.   Still with some pain and spasm.  Norco renewed.  Robaxin  renewed.   Meds ordered this encounter  Medications   furosemide  (LASIX ) 20 MG tablet    Sig: Take 1 tablet (20 mg total) by mouth 2 (two) times daily.    Dispense:  180 tablet    Refill:  3   losartan  (COZAAR ) 25 MG tablet    Sig: Take 1 tablet (25 mg total) by mouth daily.    Dispense:  90 tablet    Refill:  3   metoprolol  succinate (TOPROL -XL) 25 MG 24 hr tablet    Sig: Take 0.5 tablets (12.5 mg total) by mouth daily.    Dispense:  45 tablet    Refill:  3   HYDROcodone -acetaminophen  (NORCO/VICODIN) 5-325 MG tablet    Sig: Take 1 tablet by mouth every 6 (six) hours as needed  for moderate pain (pain score 4-6).    Dispense:  30 tablet    Refill:  0   methocarbamol  (ROBAXIN ) 500 MG tablet    Sig: Take 1 tablet (500 mg total) by mouth 3 (three) times daily.    Dispense:  90 tablet    Refill:  0    No follow-ups on file.    This visit occurred during the SARS-CoV-2 public health emergency.  Safety protocols were in place, including screening questions prior to the visit, additional usage of staff PPE, and extensive cleaning of exam room while observing appropriate contact time as indicated for disinfecting solutions.

## 2023-03-23 NOTE — Assessment & Plan Note (Signed)
 Blood pressure is little low.  He is not taking metoprolol  at this time.  I would like for him to add this back on at 12.5 mg daily given his history of coronary artery disease and paroxysmal A-fib.  Reducing losartan  to 25 mg.

## 2023-03-23 NOTE — Patient Instructions (Signed)
 Reduce losartan  to 25mg .  A new prescription has been sent in.  Toprol  XL 1/2 tab daily.

## 2023-03-23 NOTE — Assessment & Plan Note (Signed)
 Recent cervical spine surgery.  Still with some pain and spasm.  Norco renewed.  Robaxin  renewed.

## 2023-03-25 DIAGNOSIS — N3281 Overactive bladder: Secondary | ICD-10-CM | POA: Diagnosis not present

## 2023-03-25 DIAGNOSIS — E1122 Type 2 diabetes mellitus with diabetic chronic kidney disease: Secondary | ICD-10-CM | POA: Diagnosis not present

## 2023-03-25 DIAGNOSIS — I129 Hypertensive chronic kidney disease with stage 1 through stage 4 chronic kidney disease, or unspecified chronic kidney disease: Secondary | ICD-10-CM | POA: Diagnosis not present

## 2023-03-25 DIAGNOSIS — Z4789 Encounter for other orthopedic aftercare: Secondary | ICD-10-CM | POA: Diagnosis not present

## 2023-03-25 DIAGNOSIS — I480 Paroxysmal atrial fibrillation: Secondary | ICD-10-CM | POA: Diagnosis not present

## 2023-03-25 DIAGNOSIS — N183 Chronic kidney disease, stage 3 unspecified: Secondary | ICD-10-CM | POA: Diagnosis not present

## 2023-03-26 DIAGNOSIS — Z4789 Encounter for other orthopedic aftercare: Secondary | ICD-10-CM | POA: Diagnosis not present

## 2023-03-26 DIAGNOSIS — E1122 Type 2 diabetes mellitus with diabetic chronic kidney disease: Secondary | ICD-10-CM | POA: Diagnosis not present

## 2023-03-26 DIAGNOSIS — N183 Chronic kidney disease, stage 3 unspecified: Secondary | ICD-10-CM | POA: Diagnosis not present

## 2023-03-26 DIAGNOSIS — I480 Paroxysmal atrial fibrillation: Secondary | ICD-10-CM | POA: Diagnosis not present

## 2023-03-26 DIAGNOSIS — N3281 Overactive bladder: Secondary | ICD-10-CM | POA: Diagnosis not present

## 2023-03-26 DIAGNOSIS — I129 Hypertensive chronic kidney disease with stage 1 through stage 4 chronic kidney disease, or unspecified chronic kidney disease: Secondary | ICD-10-CM | POA: Diagnosis not present

## 2023-03-30 DIAGNOSIS — Z4789 Encounter for other orthopedic aftercare: Secondary | ICD-10-CM | POA: Diagnosis not present

## 2023-03-30 DIAGNOSIS — I480 Paroxysmal atrial fibrillation: Secondary | ICD-10-CM | POA: Diagnosis not present

## 2023-03-30 DIAGNOSIS — I129 Hypertensive chronic kidney disease with stage 1 through stage 4 chronic kidney disease, or unspecified chronic kidney disease: Secondary | ICD-10-CM | POA: Diagnosis not present

## 2023-03-30 DIAGNOSIS — E1122 Type 2 diabetes mellitus with diabetic chronic kidney disease: Secondary | ICD-10-CM | POA: Diagnosis not present

## 2023-03-30 DIAGNOSIS — N3281 Overactive bladder: Secondary | ICD-10-CM | POA: Diagnosis not present

## 2023-03-30 DIAGNOSIS — N183 Chronic kidney disease, stage 3 unspecified: Secondary | ICD-10-CM | POA: Diagnosis not present

## 2023-03-31 DIAGNOSIS — I480 Paroxysmal atrial fibrillation: Secondary | ICD-10-CM | POA: Diagnosis not present

## 2023-03-31 DIAGNOSIS — E1122 Type 2 diabetes mellitus with diabetic chronic kidney disease: Secondary | ICD-10-CM | POA: Diagnosis not present

## 2023-03-31 DIAGNOSIS — Z4789 Encounter for other orthopedic aftercare: Secondary | ICD-10-CM | POA: Diagnosis not present

## 2023-03-31 DIAGNOSIS — N3281 Overactive bladder: Secondary | ICD-10-CM | POA: Diagnosis not present

## 2023-03-31 DIAGNOSIS — I129 Hypertensive chronic kidney disease with stage 1 through stage 4 chronic kidney disease, or unspecified chronic kidney disease: Secondary | ICD-10-CM | POA: Diagnosis not present

## 2023-03-31 DIAGNOSIS — N183 Chronic kidney disease, stage 3 unspecified: Secondary | ICD-10-CM | POA: Diagnosis not present

## 2023-04-02 ENCOUNTER — Other Ambulatory Visit: Payer: Self-pay | Admitting: Family Medicine

## 2023-04-02 DIAGNOSIS — E1122 Type 2 diabetes mellitus with diabetic chronic kidney disease: Secondary | ICD-10-CM | POA: Diagnosis not present

## 2023-04-02 DIAGNOSIS — I480 Paroxysmal atrial fibrillation: Secondary | ICD-10-CM | POA: Diagnosis not present

## 2023-04-02 DIAGNOSIS — N3281 Overactive bladder: Secondary | ICD-10-CM | POA: Diagnosis not present

## 2023-04-02 DIAGNOSIS — N183 Chronic kidney disease, stage 3 unspecified: Secondary | ICD-10-CM | POA: Diagnosis not present

## 2023-04-02 DIAGNOSIS — Z4789 Encounter for other orthopedic aftercare: Secondary | ICD-10-CM | POA: Diagnosis not present

## 2023-04-02 DIAGNOSIS — I129 Hypertensive chronic kidney disease with stage 1 through stage 4 chronic kidney disease, or unspecified chronic kidney disease: Secondary | ICD-10-CM | POA: Diagnosis not present

## 2023-04-07 DIAGNOSIS — N3281 Overactive bladder: Secondary | ICD-10-CM | POA: Diagnosis not present

## 2023-04-07 DIAGNOSIS — I480 Paroxysmal atrial fibrillation: Secondary | ICD-10-CM | POA: Diagnosis not present

## 2023-04-07 DIAGNOSIS — N183 Chronic kidney disease, stage 3 unspecified: Secondary | ICD-10-CM | POA: Diagnosis not present

## 2023-04-07 DIAGNOSIS — I129 Hypertensive chronic kidney disease with stage 1 through stage 4 chronic kidney disease, or unspecified chronic kidney disease: Secondary | ICD-10-CM | POA: Diagnosis not present

## 2023-04-07 DIAGNOSIS — Z4789 Encounter for other orthopedic aftercare: Secondary | ICD-10-CM | POA: Diagnosis not present

## 2023-04-07 DIAGNOSIS — E1122 Type 2 diabetes mellitus with diabetic chronic kidney disease: Secondary | ICD-10-CM | POA: Diagnosis not present

## 2023-04-08 DIAGNOSIS — R1312 Dysphagia, oropharyngeal phase: Secondary | ICD-10-CM | POA: Diagnosis not present

## 2023-04-08 DIAGNOSIS — Z96643 Presence of artificial hip joint, bilateral: Secondary | ICD-10-CM | POA: Diagnosis not present

## 2023-04-08 DIAGNOSIS — M109 Gout, unspecified: Secondary | ICD-10-CM | POA: Diagnosis not present

## 2023-04-08 DIAGNOSIS — M1990 Unspecified osteoarthritis, unspecified site: Secondary | ICD-10-CM | POA: Diagnosis not present

## 2023-04-08 DIAGNOSIS — D509 Iron deficiency anemia, unspecified: Secondary | ICD-10-CM | POA: Diagnosis not present

## 2023-04-08 DIAGNOSIS — Z9049 Acquired absence of other specified parts of digestive tract: Secondary | ICD-10-CM | POA: Diagnosis not present

## 2023-04-08 DIAGNOSIS — Z933 Colostomy status: Secondary | ICD-10-CM | POA: Diagnosis not present

## 2023-04-08 DIAGNOSIS — F32A Depression, unspecified: Secondary | ICD-10-CM | POA: Diagnosis not present

## 2023-04-08 DIAGNOSIS — G4700 Insomnia, unspecified: Secondary | ICD-10-CM | POA: Diagnosis not present

## 2023-04-08 DIAGNOSIS — Z7901 Long term (current) use of anticoagulants: Secondary | ICD-10-CM | POA: Diagnosis not present

## 2023-04-08 DIAGNOSIS — E785 Hyperlipidemia, unspecified: Secondary | ICD-10-CM | POA: Diagnosis not present

## 2023-04-08 DIAGNOSIS — Z8673 Personal history of transient ischemic attack (TIA), and cerebral infarction without residual deficits: Secondary | ICD-10-CM | POA: Diagnosis not present

## 2023-04-08 DIAGNOSIS — F419 Anxiety disorder, unspecified: Secondary | ICD-10-CM | POA: Diagnosis not present

## 2023-04-08 DIAGNOSIS — N183 Chronic kidney disease, stage 3 unspecified: Secondary | ICD-10-CM | POA: Diagnosis not present

## 2023-04-08 DIAGNOSIS — I129 Hypertensive chronic kidney disease with stage 1 through stage 4 chronic kidney disease, or unspecified chronic kidney disease: Secondary | ICD-10-CM | POA: Diagnosis not present

## 2023-04-08 DIAGNOSIS — G3184 Mild cognitive impairment, so stated: Secondary | ICD-10-CM | POA: Diagnosis not present

## 2023-04-08 DIAGNOSIS — E1122 Type 2 diabetes mellitus with diabetic chronic kidney disease: Secondary | ICD-10-CM | POA: Diagnosis not present

## 2023-04-08 DIAGNOSIS — N3281 Overactive bladder: Secondary | ICD-10-CM | POA: Diagnosis not present

## 2023-04-08 DIAGNOSIS — Z4789 Encounter for other orthopedic aftercare: Secondary | ICD-10-CM | POA: Diagnosis not present

## 2023-04-08 DIAGNOSIS — I480 Paroxysmal atrial fibrillation: Secondary | ICD-10-CM | POA: Diagnosis not present

## 2023-04-08 DIAGNOSIS — Z85038 Personal history of other malignant neoplasm of large intestine: Secondary | ICD-10-CM | POA: Diagnosis not present

## 2023-04-08 DIAGNOSIS — K5981 Ogilvie syndrome: Secondary | ICD-10-CM | POA: Diagnosis not present

## 2023-04-08 DIAGNOSIS — G4730 Sleep apnea, unspecified: Secondary | ICD-10-CM | POA: Diagnosis not present

## 2023-04-09 DIAGNOSIS — I480 Paroxysmal atrial fibrillation: Secondary | ICD-10-CM | POA: Diagnosis not present

## 2023-04-09 DIAGNOSIS — E1122 Type 2 diabetes mellitus with diabetic chronic kidney disease: Secondary | ICD-10-CM | POA: Diagnosis not present

## 2023-04-09 DIAGNOSIS — N183 Chronic kidney disease, stage 3 unspecified: Secondary | ICD-10-CM | POA: Diagnosis not present

## 2023-04-09 DIAGNOSIS — N3281 Overactive bladder: Secondary | ICD-10-CM | POA: Diagnosis not present

## 2023-04-09 DIAGNOSIS — I129 Hypertensive chronic kidney disease with stage 1 through stage 4 chronic kidney disease, or unspecified chronic kidney disease: Secondary | ICD-10-CM | POA: Diagnosis not present

## 2023-04-09 DIAGNOSIS — Z4789 Encounter for other orthopedic aftercare: Secondary | ICD-10-CM | POA: Diagnosis not present

## 2023-04-10 DIAGNOSIS — E1122 Type 2 diabetes mellitus with diabetic chronic kidney disease: Secondary | ICD-10-CM | POA: Diagnosis not present

## 2023-04-10 DIAGNOSIS — I129 Hypertensive chronic kidney disease with stage 1 through stage 4 chronic kidney disease, or unspecified chronic kidney disease: Secondary | ICD-10-CM | POA: Diagnosis not present

## 2023-04-10 DIAGNOSIS — N183 Chronic kidney disease, stage 3 unspecified: Secondary | ICD-10-CM | POA: Diagnosis not present

## 2023-04-10 DIAGNOSIS — Z4789 Encounter for other orthopedic aftercare: Secondary | ICD-10-CM | POA: Diagnosis not present

## 2023-04-10 DIAGNOSIS — I480 Paroxysmal atrial fibrillation: Secondary | ICD-10-CM | POA: Diagnosis not present

## 2023-04-10 DIAGNOSIS — N3281 Overactive bladder: Secondary | ICD-10-CM | POA: Diagnosis not present

## 2023-04-13 DIAGNOSIS — I129 Hypertensive chronic kidney disease with stage 1 through stage 4 chronic kidney disease, or unspecified chronic kidney disease: Secondary | ICD-10-CM | POA: Diagnosis not present

## 2023-04-13 DIAGNOSIS — I480 Paroxysmal atrial fibrillation: Secondary | ICD-10-CM | POA: Diagnosis not present

## 2023-04-13 DIAGNOSIS — N183 Chronic kidney disease, stage 3 unspecified: Secondary | ICD-10-CM | POA: Diagnosis not present

## 2023-04-13 DIAGNOSIS — N3281 Overactive bladder: Secondary | ICD-10-CM | POA: Diagnosis not present

## 2023-04-13 DIAGNOSIS — E1122 Type 2 diabetes mellitus with diabetic chronic kidney disease: Secondary | ICD-10-CM | POA: Diagnosis not present

## 2023-04-13 DIAGNOSIS — Z4789 Encounter for other orthopedic aftercare: Secondary | ICD-10-CM | POA: Diagnosis not present

## 2023-04-14 DIAGNOSIS — N183 Chronic kidney disease, stage 3 unspecified: Secondary | ICD-10-CM | POA: Diagnosis not present

## 2023-04-14 DIAGNOSIS — N3281 Overactive bladder: Secondary | ICD-10-CM | POA: Diagnosis not present

## 2023-04-14 DIAGNOSIS — E1122 Type 2 diabetes mellitus with diabetic chronic kidney disease: Secondary | ICD-10-CM | POA: Diagnosis not present

## 2023-04-14 DIAGNOSIS — I129 Hypertensive chronic kidney disease with stage 1 through stage 4 chronic kidney disease, or unspecified chronic kidney disease: Secondary | ICD-10-CM | POA: Diagnosis not present

## 2023-04-14 DIAGNOSIS — Z4789 Encounter for other orthopedic aftercare: Secondary | ICD-10-CM | POA: Diagnosis not present

## 2023-04-14 DIAGNOSIS — I480 Paroxysmal atrial fibrillation: Secondary | ICD-10-CM | POA: Diagnosis not present

## 2023-04-20 DIAGNOSIS — Z4789 Encounter for other orthopedic aftercare: Secondary | ICD-10-CM | POA: Diagnosis not present

## 2023-04-20 DIAGNOSIS — I129 Hypertensive chronic kidney disease with stage 1 through stage 4 chronic kidney disease, or unspecified chronic kidney disease: Secondary | ICD-10-CM | POA: Diagnosis not present

## 2023-04-20 DIAGNOSIS — N183 Chronic kidney disease, stage 3 unspecified: Secondary | ICD-10-CM | POA: Diagnosis not present

## 2023-04-20 DIAGNOSIS — E1122 Type 2 diabetes mellitus with diabetic chronic kidney disease: Secondary | ICD-10-CM | POA: Diagnosis not present

## 2023-04-20 DIAGNOSIS — N3281 Overactive bladder: Secondary | ICD-10-CM | POA: Diagnosis not present

## 2023-04-20 DIAGNOSIS — I480 Paroxysmal atrial fibrillation: Secondary | ICD-10-CM | POA: Diagnosis not present

## 2023-04-21 DIAGNOSIS — N3281 Overactive bladder: Secondary | ICD-10-CM | POA: Diagnosis not present

## 2023-04-21 DIAGNOSIS — I480 Paroxysmal atrial fibrillation: Secondary | ICD-10-CM | POA: Diagnosis not present

## 2023-04-21 DIAGNOSIS — Z4789 Encounter for other orthopedic aftercare: Secondary | ICD-10-CM | POA: Diagnosis not present

## 2023-04-21 DIAGNOSIS — I129 Hypertensive chronic kidney disease with stage 1 through stage 4 chronic kidney disease, or unspecified chronic kidney disease: Secondary | ICD-10-CM | POA: Diagnosis not present

## 2023-04-21 DIAGNOSIS — E1122 Type 2 diabetes mellitus with diabetic chronic kidney disease: Secondary | ICD-10-CM | POA: Diagnosis not present

## 2023-04-21 DIAGNOSIS — N183 Chronic kidney disease, stage 3 unspecified: Secondary | ICD-10-CM | POA: Diagnosis not present

## 2023-04-27 DIAGNOSIS — Z4789 Encounter for other orthopedic aftercare: Secondary | ICD-10-CM | POA: Diagnosis not present

## 2023-04-27 DIAGNOSIS — I129 Hypertensive chronic kidney disease with stage 1 through stage 4 chronic kidney disease, or unspecified chronic kidney disease: Secondary | ICD-10-CM | POA: Diagnosis not present

## 2023-04-27 DIAGNOSIS — N183 Chronic kidney disease, stage 3 unspecified: Secondary | ICD-10-CM | POA: Diagnosis not present

## 2023-04-27 DIAGNOSIS — N3281 Overactive bladder: Secondary | ICD-10-CM | POA: Diagnosis not present

## 2023-04-27 DIAGNOSIS — I480 Paroxysmal atrial fibrillation: Secondary | ICD-10-CM | POA: Diagnosis not present

## 2023-04-27 DIAGNOSIS — E1122 Type 2 diabetes mellitus with diabetic chronic kidney disease: Secondary | ICD-10-CM | POA: Diagnosis not present

## 2023-04-28 DIAGNOSIS — N183 Chronic kidney disease, stage 3 unspecified: Secondary | ICD-10-CM | POA: Diagnosis not present

## 2023-04-28 DIAGNOSIS — E1122 Type 2 diabetes mellitus with diabetic chronic kidney disease: Secondary | ICD-10-CM | POA: Diagnosis not present

## 2023-04-28 DIAGNOSIS — Z4789 Encounter for other orthopedic aftercare: Secondary | ICD-10-CM | POA: Diagnosis not present

## 2023-04-28 DIAGNOSIS — I129 Hypertensive chronic kidney disease with stage 1 through stage 4 chronic kidney disease, or unspecified chronic kidney disease: Secondary | ICD-10-CM | POA: Diagnosis not present

## 2023-04-28 DIAGNOSIS — I480 Paroxysmal atrial fibrillation: Secondary | ICD-10-CM | POA: Diagnosis not present

## 2023-04-28 DIAGNOSIS — N3281 Overactive bladder: Secondary | ICD-10-CM | POA: Diagnosis not present

## 2023-04-30 DIAGNOSIS — I480 Paroxysmal atrial fibrillation: Secondary | ICD-10-CM | POA: Diagnosis not present

## 2023-04-30 DIAGNOSIS — N3281 Overactive bladder: Secondary | ICD-10-CM | POA: Diagnosis not present

## 2023-04-30 DIAGNOSIS — N183 Chronic kidney disease, stage 3 unspecified: Secondary | ICD-10-CM | POA: Diagnosis not present

## 2023-04-30 DIAGNOSIS — Z4789 Encounter for other orthopedic aftercare: Secondary | ICD-10-CM | POA: Diagnosis not present

## 2023-04-30 DIAGNOSIS — E1122 Type 2 diabetes mellitus with diabetic chronic kidney disease: Secondary | ICD-10-CM | POA: Diagnosis not present

## 2023-04-30 DIAGNOSIS — I129 Hypertensive chronic kidney disease with stage 1 through stage 4 chronic kidney disease, or unspecified chronic kidney disease: Secondary | ICD-10-CM | POA: Diagnosis not present

## 2023-05-04 DIAGNOSIS — I129 Hypertensive chronic kidney disease with stage 1 through stage 4 chronic kidney disease, or unspecified chronic kidney disease: Secondary | ICD-10-CM | POA: Diagnosis not present

## 2023-05-04 DIAGNOSIS — I480 Paroxysmal atrial fibrillation: Secondary | ICD-10-CM | POA: Diagnosis not present

## 2023-05-04 DIAGNOSIS — N183 Chronic kidney disease, stage 3 unspecified: Secondary | ICD-10-CM | POA: Diagnosis not present

## 2023-05-04 DIAGNOSIS — N3281 Overactive bladder: Secondary | ICD-10-CM | POA: Diagnosis not present

## 2023-05-04 DIAGNOSIS — E1122 Type 2 diabetes mellitus with diabetic chronic kidney disease: Secondary | ICD-10-CM | POA: Diagnosis not present

## 2023-05-04 DIAGNOSIS — Z4789 Encounter for other orthopedic aftercare: Secondary | ICD-10-CM | POA: Diagnosis not present

## 2023-05-07 ENCOUNTER — Ambulatory Visit (INDEPENDENT_AMBULATORY_CARE_PROVIDER_SITE_OTHER)

## 2023-05-07 ENCOUNTER — Ambulatory Visit (INDEPENDENT_AMBULATORY_CARE_PROVIDER_SITE_OTHER): Admitting: Family Medicine

## 2023-05-07 ENCOUNTER — Encounter: Payer: Self-pay | Admitting: Family Medicine

## 2023-05-07 VITALS — BP 132/64 | HR 71 | Ht 69.0 in | Wt 173.0 lb

## 2023-05-07 DIAGNOSIS — R5383 Other fatigue: Secondary | ICD-10-CM

## 2023-05-07 DIAGNOSIS — L2089 Other atopic dermatitis: Secondary | ICD-10-CM | POA: Diagnosis not present

## 2023-05-07 DIAGNOSIS — M4722 Other spondylosis with radiculopathy, cervical region: Secondary | ICD-10-CM | POA: Diagnosis not present

## 2023-05-07 DIAGNOSIS — M4712 Other spondylosis with myelopathy, cervical region: Secondary | ICD-10-CM

## 2023-05-07 DIAGNOSIS — R0600 Dyspnea, unspecified: Secondary | ICD-10-CM | POA: Diagnosis not present

## 2023-05-07 MED ORDER — HYDROCODONE-ACETAMINOPHEN 5-325 MG PO TABS: 1.0000 | ORAL_TABLET | Freq: Three times a day (TID) | ORAL | 0 refills | Status: DC | PRN

## 2023-05-07 MED ORDER — TRIAMCINOLONE ACETONIDE 0.1 % EX CREA: TOPICAL_CREAM | Freq: Two times a day (BID) | CUTANEOUS | 0 refills | Status: DC

## 2023-05-07 MED ORDER — METHOCARBAMOL 500 MG PO TABS: 500.0000 mg | ORAL_TABLET | Freq: Three times a day (TID) | ORAL | 0 refills | Status: DC

## 2023-05-07 NOTE — Assessment & Plan Note (Signed)
 Increased dyspnea, O2 sats look okay.  He does report some green sputum.  Chest x-ray ordered.  Additionally given recent surgery and some increased immobilization I am checking a D-dimer.  He is on 2.5 mg of eliquis twice daily.

## 2023-05-07 NOTE — Progress Notes (Signed)
 Damon Acosta - 83 y.o. male MRN [id number]  Date of birth: 07-08-1940  Subjective Chief Complaint  Patient presents with   Neck Pain   Green Saliva   colostomy issue    HPI Damon Acosta is a an 83 year old male here today due to increased neck pain and difficulty with ambulation.  He reports that he has had fairly continual pain since having cervical laminectomy and decompression in December.  He has been off pain medication for about 3 weeks and reports that the pain is relatively unbearable.  He has stiffness with trying to look upwards with increased pain.  Denies increased radicular symptoms with this.  He does have some continued weakness in his legs.  He did complete course of inpatient rehab after his surgery as well as some home physical therapy.  He is not currently doing therapy.  He was able to ambulate short distances with his walker.  He denies bowel or bladder incontinence.  Additionally, he has had cough and congestion as well as dark green sputum when coughing.  He has felt a little more dyspneic.  Denies chest pain or fever.  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   CKD (chronic kidney disease)    Complication of anesthesia    patient had dificulty waking up   Coronary artery disease    Diabetes mellitus without complication ([name])    Gout    Hyperlipidemia    Hypertension    Myocardial infarction ([name])    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    colonic pseudo-obstruction, s/p colectomy with ileostomy 12/2013   Overactive bladder    Sleep apnea    Stroke [facility])     Past Surgical History:  Procedure Laterality Date   CHOLECYSTECTOMY     COLOSTOMY     POSTERIOR CERVICAL LAMINECTOMY N/A 02/09/2023   Procedure: Laminectomy  Cervical two - Cervical seven;  Surgeon: Julio Sicks, MD;  Location: [facility] OR;  Service: Neurosurgery;  Laterality: N/A;   PROSTATE SURGERY      Social History   Socioeconomic History   Marital status: Married    Spouse name: Not on file   Number of children: Not on file   Years of education: Not on file   Highest education level: Not on file  Occupational History   Not on file  Tobacco Use   Smoking status: Former   Smokeless tobacco: Never  Vaping Use   Vaping status: Never Used  Substance and Sexual Activity   Alcohol use: Not Currently   Drug use: Never   Sexual activity: Not Currently  Other Topics Concern   Not on file  Social History Narrative   Not on file   Social Drivers of Health   Financial Resource Strain: Low Risk  (07/31/2021)   Received from [facility], [facility]   Overall Financial Resource Strain (CARDIA)    Difficulty of Paying  Living Expenses: Not hard at all  Food Insecurity: No Food Insecurity (03/27/2021)   Received from [facility], [facility]   Hunger Vital Sign    Worried About Running Out of Food in the Last Year: Never true    Ran Out of Food in the Last Year: Never true  Transportation Needs: No Transportation Needs (02/14/2023)   Received from [facility] - Transportation    Lack of Transportation (Medical): No    Lack of Transportation (Non-Medical): No  Physical Activity: Inactive (05/07/2023)   Exercise Vital Sign    Days of Exercise per Week: 0 days    Minutes of Exercise per Session: 0 min  Stress: Stress Concern Present (05/07/2023)   [employer] of Occupational Health - Occupational Stress Questionnaire    Feeling of Stress :  To some extent  Social Connections: Socially Integrated (05/07/2023)   Social Connection and Isolation Panel [NHANES]    Frequency of Communication with Friends and Family: More than three times a week    Frequency of Social Gatherings with Friends and Family: Once a week    Attends Religious Services: More than 4 times per year    Active Member of Clubs or Organizations: Yes    Attends Banker Meetings: 1 to 4 times per year    Marital Status: Married    Family History  Problem Relation Age of Onset   Diabetes Sister     Health Maintenance  Topic Date Due   Medicare Annual Wellness (AWV)  Never done   DTaP/Tdap/Td (1 - Tdap) Never done   Zoster Vaccines- Shingrix (2 of 2) 05/02/2021   OPHTHALMOLOGY EXAM  02/27/2022   HEMOGLOBIN A1C  03/19/2023   COVID-19 Vaccine (6 - Moderna risk 2024-25 season) 04/27/2023   FOOT EXAM  06/12/2023   Diabetic kidney evaluation - Urine ACR  09/30/2023   Diabetic kidney evaluation - eGFR measurement  01/29/2024   Pneumonia Vaccine 57+ Years old  Completed   INFLUENZA VACCINE  Completed   HPV VACCINES  Aged Out     ----------------------------------------------------------------------------------------------------------------------------------------------------------------------------------------------------------------- Physical Exam Ht 5\' 9"  (1.753 m)   Wt 173 lb (78.5 kg)   BMI 25.55 kg/m   Physical Exam Constitutional:      Appearance: Normal appearance.  HENT:     Head: Normocephalic and atraumatic.  Eyes:     General: No scleral icterus. Cardiovascular:     Rate and Rhythm: Normal rate and regular rhythm.  Pulmonary:     Effort: Pulmonary effort is normal.     Breath sounds: Normal breath sounds.  Musculoskeletal:     Cervical back: Neck supple.     Comments: Tenderness and significant tightness of cervical paraspinals bilaterally.  Range of motion is fairly good although he has pain with full extension of the neck.   Neurological:     Mental Status: He is alert.  Psychiatric:        Mood and Affect: Mood normal.        Behavior: Behavior normal.     ------------------------------------------------------------------------------------------------------------------------------------------------------------------------------------------------------------------- Assessment and Plan  Dyspnea Increased dyspnea, O2 sats look okay.  He does report some green sputum.  Chest x-ray ordered.  Additionally given recent surgery and some increased immobilization I am checking a D-dimer.  He is on 2.5 mg of eliquis twice daily.  Cervical spondylosis with myelopathy and radiculopathy Status post laminectomy with decompression.  He has had continued pain since having this completed.  He did follow-up with neurosurgery who  told him that that should improve over time.  Denies fever or increased radicular symptoms.  Adding on short-term hydrocodone with Robaxin.  If not improving I would like for him to follow back up with his neurosurgeon.   Meds ordered this encounter  Medications   HYDROcodone-acetaminophen (NORCO/VICODIN) 5-325 MG tablet    Sig: Take 1 tablet by mouth every 8 (eight) hours as needed for moderate pain (pain score 4-6).    Dispense:  15 tablet    Refill:  0   methocarbamol (ROBAXIN) 500 MG tablet    Sig: Take 1 tablet (500 mg total) by mouth 3 (three) times daily.    Dispense:  90 tablet    Refill:  0   triamcinolone cream (KENALOG) 0.1 %    Sig: Apply topically 2 (two) times daily.    Dispense:  30 g    Refill:  0    No follow-ups on file.    This visit occurred during the SARS-CoV-2 public health emergency.  Safety protocols were in place, including screening questions prior to the visit, additional usage of staff PPE, and extensive cleaning of exam room while observing appropriate contact time as indicated for disinfecting solutions.

## 2023-05-07 NOTE — Patient Instructions (Signed)
 Use methocarbamol every 8 hours as needed for spasm Use hydrocodone avery 6 hours as needed for pain.  We'll be in touch with lab results.

## 2023-05-07 NOTE — Assessment & Plan Note (Signed)
 Status post laminectomy with decompression.  He has had continued pain since having this completed.  He did follow-up with neurosurgery who told him that that should improve over time.  Denies fever or increased radicular symptoms.  Adding on short-term hydrocodone with Robaxin.  If not improving I would like for him to follow back up with his neurosurgeon.

## 2023-05-08 ENCOUNTER — Encounter: Payer: Self-pay | Admitting: Family Medicine

## 2023-05-08 DIAGNOSIS — D509 Iron deficiency anemia, unspecified: Secondary | ICD-10-CM | POA: Diagnosis present

## 2023-05-08 DIAGNOSIS — N3289 Other specified disorders of bladder: Secondary | ICD-10-CM | POA: Diagnosis not present

## 2023-05-08 DIAGNOSIS — R55 Syncope and collapse: Secondary | ICD-10-CM | POA: Diagnosis not present

## 2023-05-08 DIAGNOSIS — K589 Irritable bowel syndrome without diarrhea: Secondary | ICD-10-CM | POA: Diagnosis not present

## 2023-05-08 DIAGNOSIS — I440 Atrioventricular block, first degree: Secondary | ICD-10-CM | POA: Diagnosis not present

## 2023-05-08 DIAGNOSIS — N1832 Chronic kidney disease, stage 3b: Secondary | ICD-10-CM | POA: Diagnosis not present

## 2023-05-08 DIAGNOSIS — M79674 Pain in right toe(s): Secondary | ICD-10-CM | POA: Diagnosis not present

## 2023-05-08 DIAGNOSIS — I252 Old myocardial infarction: Secondary | ICD-10-CM | POA: Diagnosis not present

## 2023-05-08 DIAGNOSIS — J45909 Unspecified asthma, uncomplicated: Secondary | ICD-10-CM | POA: Diagnosis present

## 2023-05-08 DIAGNOSIS — I69391 Dysphagia following cerebral infarction: Secondary | ICD-10-CM | POA: Diagnosis not present

## 2023-05-08 DIAGNOSIS — R202 Paresthesia of skin: Secondary | ICD-10-CM | POA: Diagnosis present

## 2023-05-08 DIAGNOSIS — R059 Cough, unspecified: Secondary | ICD-10-CM | POA: Diagnosis not present

## 2023-05-08 DIAGNOSIS — I480 Paroxysmal atrial fibrillation: Secondary | ICD-10-CM | POA: Diagnosis not present

## 2023-05-08 DIAGNOSIS — M48061 Spinal stenosis, lumbar region without neurogenic claudication: Secondary | ICD-10-CM | POA: Diagnosis present

## 2023-05-08 DIAGNOSIS — R197 Diarrhea, unspecified: Secondary | ICD-10-CM | POA: Diagnosis not present

## 2023-05-08 DIAGNOSIS — N19 Unspecified kidney failure: Secondary | ICD-10-CM | POA: Diagnosis not present

## 2023-05-08 DIAGNOSIS — I129 Hypertensive chronic kidney disease with stage 1 through stage 4 chronic kidney disease, or unspecified chronic kidney disease: Secondary | ICD-10-CM | POA: Diagnosis not present

## 2023-05-08 DIAGNOSIS — M4802 Spinal stenosis, cervical region: Secondary | ICD-10-CM | POA: Diagnosis not present

## 2023-05-08 DIAGNOSIS — D631 Anemia in chronic kidney disease: Secondary | ICD-10-CM | POA: Diagnosis not present

## 2023-05-08 DIAGNOSIS — N179 Acute kidney failure, unspecified: Secondary | ICD-10-CM | POA: Diagnosis not present

## 2023-05-08 DIAGNOSIS — A0472 Enterocolitis due to Clostridium difficile, not specified as recurrent: Secondary | ICD-10-CM | POA: Diagnosis not present

## 2023-05-08 DIAGNOSIS — M109 Gout, unspecified: Secondary | ICD-10-CM | POA: Diagnosis present

## 2023-05-08 DIAGNOSIS — E039 Hypothyroidism, unspecified: Secondary | ICD-10-CM | POA: Diagnosis present

## 2023-05-08 DIAGNOSIS — N401 Enlarged prostate with lower urinary tract symptoms: Secondary | ICD-10-CM | POA: Diagnosis not present

## 2023-05-08 DIAGNOSIS — Z9049 Acquired absence of other specified parts of digestive tract: Secondary | ICD-10-CM | POA: Diagnosis not present

## 2023-05-08 DIAGNOSIS — M5001 Cervical disc disorder with myelopathy,  high cervical region: Secondary | ICD-10-CM | POA: Diagnosis not present

## 2023-05-08 DIAGNOSIS — I25118 Atherosclerotic heart disease of native coronary artery with other forms of angina pectoris: Secondary | ICD-10-CM | POA: Diagnosis not present

## 2023-05-08 DIAGNOSIS — R338 Other retention of urine: Secondary | ICD-10-CM | POA: Diagnosis not present

## 2023-05-08 DIAGNOSIS — N2889 Other specified disorders of kidney and ureter: Secondary | ICD-10-CM | POA: Diagnosis not present

## 2023-05-08 DIAGNOSIS — R1310 Dysphagia, unspecified: Secondary | ICD-10-CM | POA: Diagnosis present

## 2023-05-08 DIAGNOSIS — G4733 Obstructive sleep apnea (adult) (pediatric): Secondary | ICD-10-CM | POA: Diagnosis not present

## 2023-05-08 DIAGNOSIS — E1122 Type 2 diabetes mellitus with diabetic chronic kidney disease: Secondary | ICD-10-CM | POA: Diagnosis not present

## 2023-05-08 DIAGNOSIS — M47812 Spondylosis without myelopathy or radiculopathy, cervical region: Secondary | ICD-10-CM | POA: Diagnosis not present

## 2023-05-08 DIAGNOSIS — I951 Orthostatic hypotension: Secondary | ICD-10-CM | POA: Diagnosis present

## 2023-05-08 LAB — CBC WITH DIFFERENTIAL/PLATELET
Basophils Absolute: 0 10*3/uL (ref 0.0–0.2)
Basos: 0 %
EOS (ABSOLUTE): 0.1 10*3/uL (ref 0.0–0.4)
Eos: 1 %
Hematocrit: 34.2 % — ABNORMAL LOW (ref 37.5–51.0)
Hemoglobin: 10.3 g/dL — ABNORMAL LOW (ref 13.0–17.7)
Immature Grans (Abs): 0 10*3/uL (ref 0.0–0.1)
Immature Granulocytes: 0 %
Lymphocytes Absolute: 1.5 10*3/uL (ref 0.7–3.1)
Lymphs: 24 %
MCH: 24.9 pg — ABNORMAL LOW (ref 26.6–33.0)
MCHC: 30.1 g/dL — ABNORMAL LOW (ref 31.5–35.7)
MCV: 83 fL (ref 79–97)
Monocytes Absolute: 0.5 10*3/uL (ref 0.1–0.9)
Monocytes: 8 %
Neutrophils Absolute: 4.3 10*3/uL (ref 1.4–7.0)
Neutrophils: 67 %
Platelets: 197 10*3/uL (ref 150–450)
RBC: 4.14 x10E6/uL (ref 4.14–5.80)
RDW: 16.1 % — ABNORMAL HIGH (ref 11.6–15.4)
WBC: 6.4 10*3/uL (ref 3.4–10.8)

## 2023-05-08 LAB — CMP14+EGFR
ALT: 11 IU/L (ref 0–44)
AST: 14 IU/L (ref 0–40)
Albumin: 4.1 g/dL (ref 3.7–4.7)
Alkaline Phosphatase: 109 IU/L (ref 44–121)
BUN/Creatinine Ratio: 38 — ABNORMAL HIGH (ref 10–24)
BUN: 98 mg/dL (ref 8–27)
Bilirubin Total: 0.3 mg/dL (ref 0.0–1.2)
CO2: 14 mmol/L — ABNORMAL LOW (ref 20–29)
Calcium: 9.9 mg/dL (ref 8.6–10.2)
Chloride: 105 mmol/L (ref 96–106)
Creatinine, Ser: 2.58 mg/dL — ABNORMAL HIGH (ref 0.76–1.27)
Globulin, Total: 2.2 g/dL (ref 1.5–4.5)
Glucose: 103 mg/dL — ABNORMAL HIGH (ref 70–99)
Potassium: 5.8 mmol/L — ABNORMAL HIGH (ref 3.5–5.2)
Sodium: 137 mmol/L (ref 134–144)
Total Protein: 6.3 g/dL (ref 6.0–8.5)
eGFR: 24 mL/min/{1.73_m2} — ABNORMAL LOW (ref >59)

## 2023-05-08 LAB — MAGNESIUM: Magnesium: 1.3 mg/dL — ABNORMAL LOW (ref 1.6–2.3)

## 2023-05-08 LAB — D-DIMER, QUANTITATIVE: D-DIMER: 0.42 mg{FEU}/L (ref 0.00–0.49)

## 2023-05-17 DIAGNOSIS — M109 Gout, unspecified: Secondary | ICD-10-CM | POA: Diagnosis not present

## 2023-05-17 DIAGNOSIS — I129 Hypertensive chronic kidney disease with stage 1 through stage 4 chronic kidney disease, or unspecified chronic kidney disease: Secondary | ICD-10-CM | POA: Diagnosis not present

## 2023-05-17 DIAGNOSIS — N1832 Chronic kidney disease, stage 3b: Secondary | ICD-10-CM | POA: Diagnosis not present

## 2023-05-17 DIAGNOSIS — I2510 Atherosclerotic heart disease of native coronary artery without angina pectoris: Secondary | ICD-10-CM | POA: Diagnosis not present

## 2023-05-17 DIAGNOSIS — E039 Hypothyroidism, unspecified: Secondary | ICD-10-CM | POA: Diagnosis not present

## 2023-05-17 DIAGNOSIS — A0472 Enterocolitis due to Clostridium difficile, not specified as recurrent: Secondary | ICD-10-CM | POA: Diagnosis not present

## 2023-05-17 DIAGNOSIS — M1990 Unspecified osteoarthritis, unspecified site: Secondary | ICD-10-CM | POA: Diagnosis not present

## 2023-05-17 DIAGNOSIS — E1122 Type 2 diabetes mellitus with diabetic chronic kidney disease: Secondary | ICD-10-CM | POA: Diagnosis not present

## 2023-05-17 DIAGNOSIS — F419 Anxiety disorder, unspecified: Secondary | ICD-10-CM | POA: Diagnosis not present

## 2023-05-17 DIAGNOSIS — E785 Hyperlipidemia, unspecified: Secondary | ICD-10-CM | POA: Diagnosis not present

## 2023-05-17 DIAGNOSIS — R338 Other retention of urine: Secondary | ICD-10-CM | POA: Diagnosis not present

## 2023-05-17 DIAGNOSIS — Z87891 Personal history of nicotine dependence: Secondary | ICD-10-CM | POA: Diagnosis not present

## 2023-05-17 DIAGNOSIS — N3281 Overactive bladder: Secondary | ICD-10-CM | POA: Diagnosis not present

## 2023-05-17 DIAGNOSIS — Z8673 Personal history of transient ischemic attack (TIA), and cerebral infarction without residual deficits: Secondary | ICD-10-CM | POA: Diagnosis not present

## 2023-05-17 DIAGNOSIS — G4733 Obstructive sleep apnea (adult) (pediatric): Secondary | ICD-10-CM | POA: Diagnosis not present

## 2023-05-17 DIAGNOSIS — J45909 Unspecified asthma, uncomplicated: Secondary | ICD-10-CM | POA: Diagnosis not present

## 2023-05-17 DIAGNOSIS — G4700 Insomnia, unspecified: Secondary | ICD-10-CM | POA: Diagnosis not present

## 2023-05-17 DIAGNOSIS — I4891 Unspecified atrial fibrillation: Secondary | ICD-10-CM | POA: Diagnosis not present

## 2023-05-17 DIAGNOSIS — M48061 Spinal stenosis, lumbar region without neurogenic claudication: Secondary | ICD-10-CM | POA: Diagnosis not present

## 2023-05-17 DIAGNOSIS — Z7901 Long term (current) use of anticoagulants: Secondary | ICD-10-CM | POA: Diagnosis not present

## 2023-05-17 DIAGNOSIS — K589 Irritable bowel syndrome without diarrhea: Secondary | ICD-10-CM | POA: Diagnosis not present

## 2023-05-17 DIAGNOSIS — D631 Anemia in chronic kidney disease: Secondary | ICD-10-CM | POA: Diagnosis not present

## 2023-05-17 DIAGNOSIS — I252 Old myocardial infarction: Secondary | ICD-10-CM | POA: Diagnosis not present

## 2023-05-17 DIAGNOSIS — N401 Enlarged prostate with lower urinary tract symptoms: Secondary | ICD-10-CM | POA: Diagnosis not present

## 2023-05-17 DIAGNOSIS — M4802 Spinal stenosis, cervical region: Secondary | ICD-10-CM | POA: Diagnosis not present

## 2023-05-19 ENCOUNTER — Telehealth: Payer: Self-pay

## 2023-05-19 DIAGNOSIS — N1832 Chronic kidney disease, stage 3b: Secondary | ICD-10-CM | POA: Diagnosis not present

## 2023-05-19 DIAGNOSIS — E1122 Type 2 diabetes mellitus with diabetic chronic kidney disease: Secondary | ICD-10-CM | POA: Diagnosis not present

## 2023-05-19 DIAGNOSIS — I129 Hypertensive chronic kidney disease with stage 1 through stage 4 chronic kidney disease, or unspecified chronic kidney disease: Secondary | ICD-10-CM | POA: Diagnosis not present

## 2023-05-19 DIAGNOSIS — M48061 Spinal stenosis, lumbar region without neurogenic claudication: Secondary | ICD-10-CM | POA: Diagnosis not present

## 2023-05-19 DIAGNOSIS — A0472 Enterocolitis due to Clostridium difficile, not specified as recurrent: Secondary | ICD-10-CM | POA: Diagnosis not present

## 2023-05-19 DIAGNOSIS — M4802 Spinal stenosis, cervical region: Secondary | ICD-10-CM | POA: Diagnosis not present

## 2023-05-19 NOTE — Telephone Encounter (Signed)
 Copied from CRM [id number]. Topic: Clinical - Home Health Verbal Orders >> May 19, 2023  2:33 PM Louie Boston wrote: Caller/Agency: [facility] Callback Number: [phone] Service Requested: Physical Therapy Frequency: 2 Week 2, 1 Week 3, Once every other week for 4 weeks Any new concerns about the patient? No

## 2023-05-19 NOTE — Telephone Encounter (Signed)
 Task completed. Left a detailed vm msg for [place] [facility] at [phone]. Current order is for:  2 Week 2, 1 Week 3, Once every other week for 4 weeks

## 2023-05-20 DIAGNOSIS — A0472 Enterocolitis due to Clostridium difficile, not specified as recurrent: Secondary | ICD-10-CM | POA: Diagnosis not present

## 2023-05-20 DIAGNOSIS — E1122 Type 2 diabetes mellitus with diabetic chronic kidney disease: Secondary | ICD-10-CM | POA: Diagnosis not present

## 2023-05-20 DIAGNOSIS — I129 Hypertensive chronic kidney disease with stage 1 through stage 4 chronic kidney disease, or unspecified chronic kidney disease: Secondary | ICD-10-CM | POA: Diagnosis not present

## 2023-05-20 DIAGNOSIS — M4802 Spinal stenosis, cervical region: Secondary | ICD-10-CM | POA: Diagnosis not present

## 2023-05-20 DIAGNOSIS — N1832 Chronic kidney disease, stage 3b: Secondary | ICD-10-CM | POA: Diagnosis not present

## 2023-05-20 DIAGNOSIS — M48061 Spinal stenosis, lumbar region without neurogenic claudication: Secondary | ICD-10-CM | POA: Diagnosis not present

## 2023-05-21 DIAGNOSIS — I129 Hypertensive chronic kidney disease with stage 1 through stage 4 chronic kidney disease, or unspecified chronic kidney disease: Secondary | ICD-10-CM | POA: Diagnosis not present

## 2023-05-21 DIAGNOSIS — N1832 Chronic kidney disease, stage 3b: Secondary | ICD-10-CM | POA: Diagnosis not present

## 2023-05-21 DIAGNOSIS — A0472 Enterocolitis due to Clostridium difficile, not specified as recurrent: Secondary | ICD-10-CM | POA: Diagnosis not present

## 2023-05-21 DIAGNOSIS — E1122 Type 2 diabetes mellitus with diabetic chronic kidney disease: Secondary | ICD-10-CM | POA: Diagnosis not present

## 2023-05-21 DIAGNOSIS — M48061 Spinal stenosis, lumbar region without neurogenic claudication: Secondary | ICD-10-CM | POA: Diagnosis not present

## 2023-05-21 DIAGNOSIS — M4802 Spinal stenosis, cervical region: Secondary | ICD-10-CM | POA: Diagnosis not present

## 2023-05-25 DIAGNOSIS — I129 Hypertensive chronic kidney disease with stage 1 through stage 4 chronic kidney disease, or unspecified chronic kidney disease: Secondary | ICD-10-CM | POA: Diagnosis not present

## 2023-05-25 DIAGNOSIS — E1122 Type 2 diabetes mellitus with diabetic chronic kidney disease: Secondary | ICD-10-CM | POA: Diagnosis not present

## 2023-05-25 DIAGNOSIS — N1832 Chronic kidney disease, stage 3b: Secondary | ICD-10-CM | POA: Diagnosis not present

## 2023-05-25 DIAGNOSIS — A0472 Enterocolitis due to Clostridium difficile, not specified as recurrent: Secondary | ICD-10-CM | POA: Diagnosis not present

## 2023-05-25 DIAGNOSIS — M4802 Spinal stenosis, cervical region: Secondary | ICD-10-CM | POA: Diagnosis not present

## 2023-05-25 DIAGNOSIS — M48061 Spinal stenosis, lumbar region without neurogenic claudication: Secondary | ICD-10-CM | POA: Diagnosis not present

## 2023-05-26 DIAGNOSIS — M48061 Spinal stenosis, lumbar region without neurogenic claudication: Secondary | ICD-10-CM | POA: Diagnosis not present

## 2023-05-26 DIAGNOSIS — A0472 Enterocolitis due to Clostridium difficile, not specified as recurrent: Secondary | ICD-10-CM | POA: Diagnosis not present

## 2023-05-26 DIAGNOSIS — I129 Hypertensive chronic kidney disease with stage 1 through stage 4 chronic kidney disease, or unspecified chronic kidney disease: Secondary | ICD-10-CM | POA: Diagnosis not present

## 2023-05-26 DIAGNOSIS — N1832 Chronic kidney disease, stage 3b: Secondary | ICD-10-CM | POA: Diagnosis not present

## 2023-05-26 DIAGNOSIS — E1122 Type 2 diabetes mellitus with diabetic chronic kidney disease: Secondary | ICD-10-CM | POA: Diagnosis not present

## 2023-05-26 DIAGNOSIS — M4802 Spinal stenosis, cervical region: Secondary | ICD-10-CM | POA: Diagnosis not present

## 2023-05-27 DIAGNOSIS — A0472 Enterocolitis due to Clostridium difficile, not specified as recurrent: Secondary | ICD-10-CM | POA: Diagnosis not present

## 2023-05-27 DIAGNOSIS — N1832 Chronic kidney disease, stage 3b: Secondary | ICD-10-CM | POA: Diagnosis not present

## 2023-05-27 DIAGNOSIS — E1122 Type 2 diabetes mellitus with diabetic chronic kidney disease: Secondary | ICD-10-CM | POA: Diagnosis not present

## 2023-05-27 DIAGNOSIS — M48061 Spinal stenosis, lumbar region without neurogenic claudication: Secondary | ICD-10-CM | POA: Diagnosis not present

## 2023-05-27 DIAGNOSIS — M4802 Spinal stenosis, cervical region: Secondary | ICD-10-CM | POA: Diagnosis not present

## 2023-05-27 DIAGNOSIS — I129 Hypertensive chronic kidney disease with stage 1 through stage 4 chronic kidney disease, or unspecified chronic kidney disease: Secondary | ICD-10-CM | POA: Diagnosis not present

## 2023-05-28 DIAGNOSIS — E1122 Type 2 diabetes mellitus with diabetic chronic kidney disease: Secondary | ICD-10-CM | POA: Diagnosis not present

## 2023-05-28 DIAGNOSIS — A0472 Enterocolitis due to Clostridium difficile, not specified as recurrent: Secondary | ICD-10-CM | POA: Diagnosis not present

## 2023-05-28 DIAGNOSIS — M48061 Spinal stenosis, lumbar region without neurogenic claudication: Secondary | ICD-10-CM | POA: Diagnosis not present

## 2023-05-28 DIAGNOSIS — M4802 Spinal stenosis, cervical region: Secondary | ICD-10-CM | POA: Diagnosis not present

## 2023-05-28 DIAGNOSIS — I129 Hypertensive chronic kidney disease with stage 1 through stage 4 chronic kidney disease, or unspecified chronic kidney disease: Secondary | ICD-10-CM | POA: Diagnosis not present

## 2023-05-28 DIAGNOSIS — N1832 Chronic kidney disease, stage 3b: Secondary | ICD-10-CM | POA: Diagnosis not present

## 2023-06-01 DIAGNOSIS — E1122 Type 2 diabetes mellitus with diabetic chronic kidney disease: Secondary | ICD-10-CM | POA: Diagnosis not present

## 2023-06-01 DIAGNOSIS — M4802 Spinal stenosis, cervical region: Secondary | ICD-10-CM | POA: Diagnosis not present

## 2023-06-01 DIAGNOSIS — I129 Hypertensive chronic kidney disease with stage 1 through stage 4 chronic kidney disease, or unspecified chronic kidney disease: Secondary | ICD-10-CM | POA: Diagnosis not present

## 2023-06-01 DIAGNOSIS — M48061 Spinal stenosis, lumbar region without neurogenic claudication: Secondary | ICD-10-CM | POA: Diagnosis not present

## 2023-06-01 DIAGNOSIS — N1832 Chronic kidney disease, stage 3b: Secondary | ICD-10-CM | POA: Diagnosis not present

## 2023-06-01 DIAGNOSIS — A0472 Enterocolitis due to Clostridium difficile, not specified as recurrent: Secondary | ICD-10-CM | POA: Diagnosis not present

## 2023-06-02 ENCOUNTER — Other Ambulatory Visit: Payer: Self-pay | Admitting: Family Medicine

## 2023-06-02 DIAGNOSIS — M48061 Spinal stenosis, lumbar region without neurogenic claudication: Secondary | ICD-10-CM | POA: Diagnosis not present

## 2023-06-02 DIAGNOSIS — A0472 Enterocolitis due to Clostridium difficile, not specified as recurrent: Secondary | ICD-10-CM | POA: Diagnosis not present

## 2023-06-02 DIAGNOSIS — M4802 Spinal stenosis, cervical region: Secondary | ICD-10-CM | POA: Diagnosis not present

## 2023-06-02 DIAGNOSIS — N1832 Chronic kidney disease, stage 3b: Secondary | ICD-10-CM | POA: Diagnosis not present

## 2023-06-02 DIAGNOSIS — E1122 Type 2 diabetes mellitus with diabetic chronic kidney disease: Secondary | ICD-10-CM | POA: Diagnosis not present

## 2023-06-02 DIAGNOSIS — I129 Hypertensive chronic kidney disease with stage 1 through stage 4 chronic kidney disease, or unspecified chronic kidney disease: Secondary | ICD-10-CM | POA: Diagnosis not present

## 2023-06-03 ENCOUNTER — Ambulatory Visit: Attending: Neurosurgery | Admitting: Physical Therapy

## 2023-06-03 ENCOUNTER — Other Ambulatory Visit: Payer: Self-pay

## 2023-06-03 ENCOUNTER — Encounter: Payer: Self-pay | Admitting: Physical Therapy

## 2023-06-03 DIAGNOSIS — R29898 Other symptoms and signs involving the musculoskeletal system: Secondary | ICD-10-CM | POA: Insufficient documentation

## 2023-06-03 DIAGNOSIS — M6281 Muscle weakness (generalized): Secondary | ICD-10-CM | POA: Diagnosis not present

## 2023-06-03 NOTE — Therapy (Signed)
 OUTPATIENT PHYSICAL THERAPY CERVICAL EVALUATION   Patient Name: Damon Acosta MRN: [id number] DOB:1940-03-22, 83 y.o., male Today's Date: 06/03/2023  END OF SESSION:  PT End of Session - 06/03/23 1142     Visit Number 1    Number of Visits 16    Date for PT Re-Evaluation 07/29/23    Authorization Type Medicare    Progress Note Due on Visit 10    PT Start Time 1100    PT Stop Time 1139    PT Time Calculation (min) 39 min    Activity Tolerance Patient tolerated treatment well    Behavior During Therapy [facility] for tasks assessed/performed             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   CKD (chronic kidney disease)    Complication of anesthesia    patient had dificulty waking up   Coronary artery disease    Diabetes mellitus without complication ([name])    Gout    Hyperlipidemia    Hypertension    Myocardial infarction ([name])    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   colonic pseudo-obstruction, s/p colectomy with ileostomy 12/2013   Overactive bladder    Sleep apnea    Stroke [facility])    Past Surgical History:  Procedure Laterality Date   CHOLECYSTECTOMY     COLOSTOMY     POSTERIOR CERVICAL LAMINECTOMY N/A 02/09/2023   Procedure: Laminectomy  Cervical two - Cervical seven;  Surgeon: Agustina Aldrich, MD;  Location: [facility] OR;  Service: Neurosurgery;  Laterality: N/A;   PROSTATE SURGERY     Patient Active Problem List   Diagnosis Date Noted   Cervical spondylosis with myelopathy and radiculopathy 02/09/2023   Atopic dermatitis 10/28/2022   Throat clearing 09/16/2022   Dyspnea 07/07/2022   Urinary retention 06/04/2022   Cognitive change 06/04/2022   Insomnia 03/17/2022   Callus of foot, right heel 12/11/2021   Cervical cord myelomalacia  ([name]) 10/30/2021   AV block, 1st degree 08/14/2021   Sinus bradycardia on ECG 08/14/2021   OSA (obstructive sleep apnea) 08/01/2021   History of MI (myocardial infarction) 10/21/2019   Diversion colitis 10/07/2019   Gastroesophageal reflux disease without esophagitis 10/07/2019   PAF (paroxysmal atrial fibrillation) ([name]) 09/16/2019   Atrial flutter ([name]) 09/15/2019   NSTEMI (non-ST elevated myocardial infarction) ([name]) 09/15/2019   Recurrent major depressive disorder, in partial remission ([name]) 09/15/2019   Coronary artery disease of native artery of native heart with stable angina pectoris ([name]) 09/15/2019   Severe episode of recurrent major depressive disorder, without psychotic features ([name]) 09/15/2019   Asymptomatic PVCs 07/27/2018   CKD (chronic kidney disease) stage 3, GFR 30-59 ml/min ([name]) 04/27/2018   Mild cognitive impairment 04/20/2018   Microcytic anemia 04/20/2018   Primary osteoarthritis of both knees 02/17/2018   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   Type 2 diabetes mellitus with chronic kidney disease, without long-term current use of insulin  ([name]) 01/06/2018   Dyslipidemia associated with type 2 diabetes mellitus ([name]) 01/06/2018   OAB (overactive bladder) 01/06/2018   History of prostate cancer 01/06/2018   Lumbar spinal stenosis 12/15/2017   Colostomy status ([name]) 12/15/2017   Hypertension associated with diabetes ([name]) 12/15/2017   Gout 12/15/2017   History of CVA (cerebrovascular accident)  12/15/2017   Ileostomy in place [facility]) 12/15/2017    PCP: Vincenza Greener PROVIDER: Agustina Aldrich  REFERRING DIAG: cervical spinal stenosis with myelopathy  THERAPY DIAG:  Muscle weakness (generalized)  Other symptoms and signs involving the musculoskeletal system  Rationale for Evaluation and Treatment: Rehabilitation  ONSET DATE: 02/09/24  SUBJECTIVE:                                                                                                                                                                                                          SUBJECTIVE STATEMENT: Pt is s/p cervical laminectomy C2-C7 on 02/09/24. Since the surgery he continues with pain in his neck and with Lt UE weakness and bilat UE numbness/tingling. Prior to surgery he used a PFRW for ambulation but he has had a few recent falls and is now using a power w/c for mobility in and out of the house. Pt reports difficulty using Lt UE for functional activities. He reports he drops items often due to numbness in hands. Numbness does not get better or worse, it is just "always there".  Hand dominance: Right  PERTINENT HISTORY:  Lumbar spinal stenosis CVA CKD NSTEMI  PAIN:  Are you having pain? Yes: NPRS scale: 4/10 currently, 9/10 at worst Pain location: neck, LT UE Pain description: sore, ache Aggravating factors: increased use of Lt UE Relieving factors: meds  PRECAUTIONS: Fall  RED FLAGS: None     WEIGHT BEARING RESTRICTIONS: No  FALLS:  Has patient fallen in last 6 months? Yes. Number of falls 2-3  LIVING ENVIRONMENT: Lives with: lives with their family Lives in: House/apartment Stairs: No Has following equipment at home: Environmental consultant - 2 wheeled and Wheelchair (power)  OCCUPATION: retired  PLOF: Independent with basic ADLs, Independent with household mobility with device, and Requires assistive device for independence  PATIENT GOALS: decrease pain, get Lt UE to work, decrease numbness  NEXT MD VISIT: none on file  OBJECTIVE:  Note: Objective measures were completed at Evaluation unless otherwise noted.  DIAGNOSTIC FINDINGS:  None on file since surgery  PATIENT SURVEYS:  NDI 26/50 - 52%  COGNITION: Overall cognitive status: History of cognitive impairments - at baseline  SENSATION: Light touch: Impaired   POSTURE: forward head  PALPATION: Increased mm spasticity Rt > Lt cervical paraspinals Increased tenderness to light touch throughout cervical  musculature, UT bilat Some decreased symptoms with manual traction   CERVICAL ROM:   Active ROM A/PROM (deg) eval  Flexion 34  Extension 14 pain  Right lateral flexion 35  Left lateral flexion 25  Right rotation 38  Left rotation 40   (Blank rows = not tested)  UPPER EXTREMITY MMT:  MMT Right eval Left eval  Shoulder flexion 4 3-  Shoulder extension    Shoulder abduction 4 3-  Shoulder adduction    Shoulder extension    Shoulder internal rotation    Shoulder external rotation    Elbow flexion 4+ 3+  Elbow extension 4+ 3  Wrist flexion    Wrist extension    Wrist ulnar deviation    Wrist radial deviation    Wrist pronation    GRIP STRENGTH 50 19   (Blank rows = not tested)  UPPER EXTREMITY ROM:  Active ROM Right eval Left eval  Shoulder flexion WFL 60  Shoulder extension    Shoulder abduction WFL 85 pain  Shoulder adduction    Shoulder extension    Shoulder internal rotation    Shoulder external rotation    Elbow flexion    Elbow extension    Wrist flexion    Wrist extension    Wrist ulnar deviation    Wrist radial deviation    Wrist pronation    Wrist supination     (Blank rows = not tested)  CERVICAL SPECIAL TESTS:  Spurling's test: Positive   TREATMENT DATE: 06/03/23 See HEP Pt educated on PT POC and goals, HEP, rationale for treatment                                                                                                                                 PATIENT EDUCATION:  Education details: PT POC and goals, HEP Person educated: Patient Education method: Explanation, Demonstration, and Handouts Education comprehension: verbalized understanding and returned demonstration  HOME EXERCISE PROGRAM: Access Code: [email or web address] URL: [email or web address] Date: 06/03/2023 Prepared by: Lowery Rue  Exercises - Seated Scapular Retraction  - 1 x daily - 7 x weekly - 2 sets - 10 reps - Seated Shoulder Abduction AAROM with  Dowel  - 1 x daily - 7 x weekly - 2 sets - 10 reps - Supine Shoulder Flexion Extension AAROM with Dowel  - 1 x daily - 7 x weekly - 2 sets - 10 reps  ASSESSMENT:  CLINICAL IMPRESSION: Patient is a 83 y.o. male who was seen today for physical therapy evaluation and treatment for cervical stenosis with myelopathy. Pt presents with impaired sensation, decreased strength and ROM, decreased functional activity tolerance, increased pain and increased tenderness to palpation. Pt will benefit from skilled PT to address deficits and improve functional mobility.   OBJECTIVE IMPAIRMENTS: decreased activity tolerance, decreased mobility, decreased ROM, decreased strength, increased muscle spasms, impaired sensation, impaired UE functional use, postural dysfunction, and pain.   ACTIVITY LIMITATIONS: carrying, lifting, sleeping, bathing, dressing, and reach over head  PARTICIPATION LIMITATIONS:  community activites  PERSONAL FACTORS: Time since onset of injury/illness/exacerbation and 3+ comorbidities: history of CVA, NSTEMI, lumbar stenosis  are also affecting patient's functional outcome.  REHAB POTENTIAL: Good  CLINICAL DECISION MAKING: Evolving/moderate complexity  EVALUATION COMPLEXITY: Moderate   GOALS: Goals reviewed with patient? Yes  SHORT TERM GOALS: Target date: 07/01/2023    Pt will be independent in initial HEP Baseline:  Goal status: INITIAL  2.  Pt will improve Lt UE AROM to 90 degrees flexion and 100 degrees abduction Baseline: see above Goal status: INITIAL  LONG TERM GOALS: Target date: 07/29/2023   Pt will be independent with advanced HEP Baseline:  Goal status: INITIAL  2.  NDI will improve to <= 16/50 to demo improved functional mobility Baseline: 26/50 Goal status: INITIAL  3.  Pt will improve Lt LE strength to 4/5 to improve functional activity tolerance Baseline: see above Goal status: INITIAL  4.  Pt will report 50% reduction in numbness in bilat  UEs Baseline:  Goal status: INITIAL     PLAN:  PT FREQUENCY: 2x/week  PT DURATION: 8 weeks  PLANNED INTERVENTIONS: 97164- PT Re-evaluation, 97110-Therapeutic exercises, 97530- Therapeutic activity, [id number]- Neuromuscular re-education, 97535- Self Care, 16109- Manual therapy, [id number]- Aquatic Therapy, U0454- Electrical stimulation (unattended), Patient/Family education, Taping, Dry Needling, Cryotherapy, and Moist heat  PLAN FOR NEXT SESSION: assess response to HEP, postural strength, cervical ROM and strength   Aaliyah Cancro, PT 06/03/2023, 11:43 AM

## 2023-06-09 ENCOUNTER — Ambulatory Visit: Admitting: Physical Therapy

## 2023-06-09 ENCOUNTER — Encounter: Payer: Self-pay | Admitting: Physical Therapy

## 2023-06-09 DIAGNOSIS — M6281 Muscle weakness (generalized): Secondary | ICD-10-CM

## 2023-06-09 DIAGNOSIS — R29898 Other symptoms and signs involving the musculoskeletal system: Secondary | ICD-10-CM

## 2023-06-09 NOTE — Therapy (Signed)
 OUTPATIENT PHYSICAL THERAPY CERVICAL TREATMENT   Patient Name: Damon Acosta MRN: [id number] DOB:[birth year], 83 y.o., male Today's Date: 06/09/2023  END OF SESSION:  PT End of Session - 06/09/23 1230     Visit Number 2    Number of Visits 16    Date for PT Re-Evaluation 07/29/23    Authorization Type Medicare    Authorization - Visit Number 1    Progress Note Due on Visit 10    PT Start Time 1152    PT Stop Time 1230    PT Time Calculation (min) 38 min    Activity Tolerance Patient tolerated treatment well    Behavior During Therapy [facility] for tasks assessed/performed              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   CKD (chronic kidney disease)    Complication of anesthesia    patient had dificulty waking up   Coronary artery disease    Diabetes mellitus without complication ([name])    Gout    Hyperlipidemia    Hypertension    Myocardial infarction ([name])    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   colonic pseudo-obstruction, s/p colectomy with ileostomy 12/2013   Overactive bladder    Sleep apnea    Stroke [facility])    Past Surgical History:  Procedure Laterality Date   CHOLECYSTECTOMY     COLOSTOMY     POSTERIOR CERVICAL LAMINECTOMY N/A 02/09/2023   Procedure: Laminectomy  Cervical two - Cervical seven;  Surgeon: Agustina Aldrich, MD;  Location: [facility] OR;  Service: Neurosurgery;  Laterality: N/A;   PROSTATE SURGERY     Patient Active Problem List   Diagnosis Date Noted   Cervical spondylosis with myelopathy and radiculopathy 02/09/2023   Atopic dermatitis 10/28/2022   Throat clearing 09/16/2022   Dyspnea 07/07/2022   Urinary retention 06/04/2022   Cognitive change 06/04/2022   Insomnia 03/17/2022   Callus of foot, right heel  12/11/2021   Cervical cord myelomalacia ([name]) 10/30/2021   AV block, 1st degree 08/14/2021   Sinus bradycardia on ECG 08/14/2021   OSA (obstructive sleep apnea) 08/01/2021   History of MI (myocardial infarction) 10/21/2019   Diversion colitis 10/07/2019   Gastroesophageal reflux disease without esophagitis 10/07/2019   PAF (paroxysmal atrial fibrillation) ([name]) 09/16/2019   Atrial flutter ([name]) 09/15/2019   NSTEMI (non-ST elevated myocardial infarction) ([name]) 09/15/2019   Recurrent major depressive disorder, in partial remission ([name]) 09/15/2019   Coronary artery disease of native artery of native heart with stable angina pectoris ([name]) 09/15/2019   Severe episode of recurrent major depressive disorder, without psychotic features ([name]) 09/15/2019   Asymptomatic PVCs 07/27/2018   CKD (chronic kidney disease) stage 3, GFR 30-59 ml/min ([name]) 04/27/2018   Mild cognitive impairment 04/20/2018   Microcytic anemia 04/20/2018   Primary osteoarthritis of both knees 02/17/2018   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   Type 2 diabetes mellitus with chronic kidney disease, without long-term current use of insulin  ([name]) 01/06/2018   Dyslipidemia associated with type 2 diabetes mellitus ([name]) 01/06/2018   OAB (overactive bladder) 01/06/2018   History of prostate cancer 01/06/2018   Lumbar spinal stenosis 12/15/2017   Colostomy status ([name]) 12/15/2017   Hypertension associated with diabetes ([name]) 12/15/2017  Gout 12/15/2017   History of CVA (cerebrovascular accident) 12/15/2017   Ileostomy in place [facility]) 12/15/2017    PCP: Vincenza Greener PROVIDER: Agustina Aldrich  REFERRING DIAG: cervical spinal stenosis with myelopathy  THERAPY DIAG:  Muscle weakness (generalized)  Other symptoms and signs involving the musculoskeletal system  Rationale for Evaluation and Treatment: Rehabilitation  ONSET DATE: 02/09/24  SUBJECTIVE:                                                                                                                                                                                                          SUBJECTIVE STATEMENT: Pt states that today is "bad day". He is having increased neck pain. He states no change in activity that caused his pain. He did perform HEP and states it was "ok"  PERTINENT HISTORY:  Lumbar spinal stenosis CVA CKD NSTEMI  Pt is s/p cervical laminectomy C2-C7 on 02/09/24. Since the surgery he continues with pain in his neck and with Lt UE weakness and bilat UE numbness/tingling. Prior to surgery he used a PFRW for ambulation but he has had a few recent falls and is now using a power w/c for mobility in and out of the house. Pt reports difficulty using Lt UE for functional activities. He reports he drops items often due to numbness in hands. Numbness does not get better or worse, it is just "always there".  Hand dominance: Right  PAIN:  Are you having pain? Yes: NPRS scale: 7/10 currently, 9/10 at worst Pain location: neck, LT UE Pain description: sore, ache Aggravating factors: increased use of Lt UE Relieving factors: meds  PRECAUTIONS: Fall  RED FLAGS: None     WEIGHT BEARING RESTRICTIONS: No  FALLS:  Has patient fallen in last 6 months? Yes. Number of falls 2-3  LIVING ENVIRONMENT: Lives with: lives with their family Lives in: House/apartment Stairs: No Has following equipment at home: Environmental consultant - 2 wheeled and Wheelchair (power)  OCCUPATION: retired  PLOF: Independent with basic ADLs, Independent with household mobility with device, and Requires assistive device for independence  PATIENT GOALS: decrease pain, get Lt UE to work, decrease numbness  NEXT MD VISIT: none on file  OBJECTIVE:  Note: Objective measures were completed at Evaluation unless otherwise noted.  DIAGNOSTIC FINDINGS:  None on file since surgery  PATIENT SURVEYS:  NDI 26/50 - 52%  COGNITION: Overall cognitive status: History of cognitive impairments  - at baseline  SENSATION: Light touch: Impaired   POSTURE: forward head  PALPATION: Increased mm spasticity Rt > Lt cervical paraspinals Increased tenderness  to light touch throughout cervical musculature, UT bilat Some decreased symptoms with manual traction   CERVICAL ROM:   Active ROM A/PROM (deg) eval  Flexion 34  Extension 14 pain  Right lateral flexion 35  Left lateral flexion 25  Right rotation 38  Left rotation 40   (Blank rows = not tested)  UPPER EXTREMITY MMT:  MMT Right eval Left eval  Shoulder flexion 4 3-  Shoulder extension    Shoulder abduction 4 3-  Shoulder adduction    Shoulder extension    Shoulder internal rotation    Shoulder external rotation    Elbow flexion 4+ 3+  Elbow extension 4+ 3  Wrist flexion    Wrist extension    Wrist ulnar deviation    Wrist radial deviation    Wrist pronation    GRIP STRENGTH 50 19   (Blank rows = not tested)  UPPER EXTREMITY ROM:  Active ROM Right eval Left eval  Shoulder flexion WFL 60  Shoulder extension    Shoulder abduction WFL 85 pain  Shoulder adduction    Shoulder extension    Shoulder internal rotation    Shoulder external rotation    Elbow flexion    Elbow extension    Wrist flexion    Wrist extension    Wrist ulnar deviation    Wrist radial deviation    Wrist pronation    Wrist supination     (Blank rows = not tested)  CERVICAL SPECIAL TESTS:  Spurling's test: Positive   [facility] Adult PT Treatment:                                                DATE: 06/09/23 Therapeutic Exercise/Activity/NMR: Janine [place] shoulder flexion with dowel x 10 Supine scap squeeze x 10 Supine cervical rotation AROM to tolerance Seated AAROM shoulder abd x 10 Seated cervical flex/ext ROM Seated row red TB Seated horizontal abd - attempted with red TB, pt unable so performed without resistance Backward shoulder rolls focus on posterior mm activation UT stretch bilat Manual Therapy: STM and TPR bilat  cervical paraspinals and UT   TREATMENT DATE: 06/03/23 See HEP Pt educated on PT POC and goals, HEP, rationale for treatment                                                                                                                                 PATIENT EDUCATION:  Education details: PT POC and goals, HEP Person educated: Patient Education method: Explanation, Demonstration, and Handouts Education comprehension: verbalized understanding and returned demonstration  HOME EXERCISE PROGRAM: Access Code: [email or web address] URL: [email or web address] Date: 06/03/2023 Prepared by: Lowery Rue  Exercises - Seated Scapular Retraction  - 1 x daily - 7 x weekly - 2 sets - 10 reps - Seated Shoulder Abduction AAROM with Dowel  -  1 x daily - 7 x weekly - 2 sets - 10 reps - Supine Shoulder Flexion Extension AAROM with Dowel  - 1 x daily - 7 x weekly - 2 sets - 10 reps  ASSESSMENT:  CLINICAL IMPRESSION: Pt with good response to manual work. He requires cuing to activate postural musculature during exercises. PT encouraged pt to perform cervical AROM all directions 2x a day.  OBJECTIVE IMPAIRMENTS: decreased activity tolerance, decreased mobility, decreased ROM, decreased strength, increased muscle spasms, impaired sensation, impaired UE functional use, postural dysfunction, and pain.      GOALS: Goals reviewed with patient? Yes  SHORT TERM GOALS: Target date: 07/01/2023    Pt will be independent in initial HEP Baseline:  Goal status: INITIAL  2.  Pt will improve Lt UE AROM to 90 degrees flexion and 100 degrees abduction Baseline: see above Goal status: INITIAL  LONG TERM GOALS: Target date: 07/29/2023   Pt will be independent with advanced HEP Baseline:  Goal status: INITIAL  2.  NDI will improve to <= 16/50 to demo improved functional mobility Baseline: 26/50 Goal status: INITIAL  3.  Pt will improve Lt LE strength to 4/5 to improve functional activity  tolerance Baseline: see above Goal status: INITIAL  4.  Pt will report 50% reduction in numbness in bilat UEs Baseline:  Goal status: INITIAL     PLAN:  PT FREQUENCY: 2x/week  PT DURATION: 8 weeks  PLANNED INTERVENTIONS: 97164- PT Re-evaluation, 97110-Therapeutic exercises, 97530- Therapeutic activity, [id number]- Neuromuscular re-education, 97535- Self Care, 09811- Manual therapy, [id number]- Aquatic Therapy, [id number]- Electrical stimulation (unattended), Patient/Family education, Taping, Dry Needling, Cryotherapy, and Moist heat  PLAN FOR NEXT SESSION: ADD CERVICAL AROM TO HEP, postural strength, cervical ROM and strength   Daschel Roughton, PT 06/09/2023, 12:30 PM

## 2023-06-10 ENCOUNTER — Other Ambulatory Visit: Payer: Self-pay | Admitting: Neurosurgery

## 2023-06-10 DIAGNOSIS — G992 Myelopathy in diseases classified elsewhere: Secondary | ICD-10-CM

## 2023-06-11 ENCOUNTER — Ambulatory Visit: Attending: Neurosurgery

## 2023-06-11 DIAGNOSIS — R29898 Other symptoms and signs involving the musculoskeletal system: Secondary | ICD-10-CM | POA: Insufficient documentation

## 2023-06-11 DIAGNOSIS — M6281 Muscle weakness (generalized): Secondary | ICD-10-CM | POA: Diagnosis not present

## 2023-06-11 NOTE — Therapy (Signed)
 OUTPATIENT PHYSICAL THERAPY CERVICAL TREATMENT   Patient Name: Damon Acosta MRN: [id number] DOB:01/29/1941, 83 y.o., male Today's Date: 06/11/2023  END OF SESSION:  PT End of Session - 06/11/23 1404     Visit Number 3    Number of Visits 16    Date for PT Re-Evaluation 07/29/23    Authorization Type Medicare    Progress Note Due on Visit 10    PT Start Time 1404    PT Stop Time 1452    PT Time Calculation (min) 48 min    Activity Tolerance Patient tolerated treatment well    Behavior During Therapy [facility] for tasks assessed/performed            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   CKD (chronic kidney disease)    Complication of anesthesia    patient had dificulty waking up   Coronary artery disease    Diabetes mellitus without complication ([name])    Gout    Hyperlipidemia    Hypertension    Myocardial infarction ([name])    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   colonic pseudo-obstruction, s/p colectomy with ileostomy 12/2013   Overactive bladder    Sleep apnea    Stroke [facility])    Past Surgical History:  Procedure Laterality Date   CHOLECYSTECTOMY     COLOSTOMY     POSTERIOR CERVICAL LAMINECTOMY N/A 02/09/2023   Procedure: Laminectomy  Cervical two - Cervical seven;  Surgeon: Agustina Aldrich, MD;  Location: [facility] OR;  Service: Neurosurgery;  Laterality: N/A;   PROSTATE SURGERY     Patient Active Problem List   Diagnosis Date Noted   Cervical spondylosis with myelopathy and radiculopathy 02/09/2023   Atopic dermatitis 10/28/2022   Throat clearing 09/16/2022   Dyspnea 07/07/2022   Urinary retention 06/04/2022   Cognitive change 06/04/2022   Insomnia 03/17/2022   Callus of foot, right heel 12/11/2021   Cervical cord myelomalacia ([name])  10/30/2021   AV block, 1st degree 08/14/2021   Sinus bradycardia on ECG 08/14/2021   OSA (obstructive sleep apnea) 08/01/2021   History of MI (myocardial infarction) 10/21/2019   Diversion colitis 10/07/2019   Gastroesophageal reflux disease without esophagitis 10/07/2019   PAF (paroxysmal atrial fibrillation) ([name]) 09/16/2019   Atrial flutter ([name]) 09/15/2019   NSTEMI (non-ST elevated myocardial infarction) ([name]) 09/15/2019   Recurrent major depressive disorder, in partial remission ([name]) 09/15/2019   Coronary artery disease of native artery of native heart with stable angina pectoris ([name]) 09/15/2019   Severe episode of recurrent major depressive disorder, without psychotic features ([name]) 09/15/2019   Asymptomatic PVCs 07/27/2018   CKD (chronic kidney disease) stage 3, GFR 30-59 ml/min ([name]) 04/27/2018   Mild cognitive impairment 04/20/2018   Microcytic anemia 04/20/2018   Primary osteoarthritis of both knees 02/17/2018   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   Type 2 diabetes mellitus with chronic kidney disease, without long-term current use of insulin  ([name]) 01/06/2018   Dyslipidemia associated with type 2 diabetes mellitus ([name]) 01/06/2018   OAB (overactive bladder) 01/06/2018   History of prostate cancer 01/06/2018   Lumbar spinal stenosis 12/15/2017   Colostomy status ([name]) 12/15/2017   Hypertension associated with diabetes ([name]) 12/15/2017   Gout 12/15/2017   History of CVA (cerebrovascular accident) 12/15/2017  Ileostomy in place [facility]) 12/15/2017    PCP: Vincenza Greener PROVIDER: Agustina Aldrich  REFERRING DIAG: cervical spinal stenosis with myelopathy  THERAPY DIAG:  Muscle weakness (generalized)  Other symptoms and signs involving the musculoskeletal system  Rationale for Evaluation and Treatment: Rehabilitation  ONSET DATE: 02/09/24  SUBJECTIVE:                                                                                                                                                                                                          SUBJECTIVE STATEMENT: Patient reports his neck is sore today; patient is compliant with HEP.  PERTINENT HISTORY:  Lumbar spinal stenosis CVA CKD NSTEMI  Pt is s/p cervical laminectomy C2-C7 on 02/09/24. Since the surgery he continues with pain in his neck and with Lt UE weakness and bilat UE numbness/tingling. Prior to surgery he used a PFRW for ambulation but he has had a few recent falls and is now using a power w/c for mobility in and out of the house. Pt reports difficulty using Lt UE for functional activities. He reports he drops items often due to numbness in hands. Numbness does not get better or worse, it is just "always there".  Hand dominance: Right  PAIN:  Are you having pain? Yes: NPRS scale: 7/10 currently, 9/10 at worst Pain location: neck, LT UE Pain description: sore, ache Aggravating factors: increased use of Lt UE Relieving factors: meds  PRECAUTIONS: Fall  RED FLAGS: None     WEIGHT BEARING RESTRICTIONS: No  FALLS:  Has patient fallen in last 6 months? Yes. Number of falls 2-3  LIVING ENVIRONMENT: Lives with: lives with their family Lives in: House/apartment Stairs: No Has following equipment at home: Environmental consultant - 2 wheeled and Wheelchair (power)  OCCUPATION: retired  PLOF: Independent with basic ADLs, Independent with household mobility with device, and Requires assistive device for independence  PATIENT GOALS: decrease pain, get Lt UE to work, decrease numbness  NEXT MD VISIT: none on file  OBJECTIVE:  Note: Objective measures were completed at Evaluation unless otherwise noted.  DIAGNOSTIC FINDINGS:  None on file since surgery  PATIENT SURVEYS:  NDI 26/50 - 52%  COGNITION: Overall cognitive status: History of cognitive impairments - at baseline  SENSATION: Light touch: Impaired   POSTURE: forward head  PALPATION: Increased mm spasticity Rt > Lt cervical  paraspinals Increased tenderness to light touch throughout cervical musculature, UT bilat Some decreased symptoms with manual traction   CERVICAL ROM:   Active ROM A/PROM (deg) eval  Flexion 34  Extension 14 pain  Right lateral flexion 35  Left lateral flexion 25  Right rotation 38  Left rotation 40   (Blank rows = not tested)  UPPER EXTREMITY MMT:  MMT Right eval Left eval  Shoulder flexion 4 3-  Shoulder extension    Shoulder abduction 4 3-  Shoulder adduction    Shoulder extension    Shoulder internal rotation    Shoulder external rotation    Elbow flexion 4+ 3+  Elbow extension 4+ 3  Wrist flexion    Wrist extension    Wrist ulnar deviation    Wrist radial deviation    Wrist pronation    GRIP STRENGTH 50 19   (Blank rows = not tested)  UPPER EXTREMITY ROM:  Active ROM Right eval Left eval  Shoulder flexion WFL 60  Shoulder extension    Shoulder abduction WFL 85 pain  Shoulder adduction    Shoulder extension    Shoulder internal rotation    Shoulder external rotation    Elbow flexion    Elbow extension    Wrist flexion    Wrist extension    Wrist ulnar deviation    Wrist radial deviation    Wrist pronation    Wrist supination     (Blank rows = not tested)  CERVICAL SPECIAL TESTS:  Spurling's test: Positive   [facility] Adult PT Treatment:                                                DATE: 06/11/2023 Therapeutic Exercise: Supine + 2 pillows & head of table raised: AAROM shoulder flexion with dowel --> cueing active stretch at end range AAROM shoulder abduction with cane  Cervical rotation AROM Seated: Small range thoracic extension against horizontal noodle --> tried a few with deflated coregeous ball (noodle felt better) Manual Therapy: STM cervical paraspinals, anterior scalenes, UT/LS, cervical upglides, suboccipitals, SOR  Neuromuscular re-ed: Supine + 2 pillows & head of table raised: Cervical retraction with towel roll behind  neck Shoulder horizontal abduction with YTB + breathing cues  Seated thoracic extension (small range) + breathing cues to promote diaphragm activation Self Care: Log rolling --> bed mobility    [facility] Adult PT Treatment:                                                DATE: 06/09/23 Therapeutic Exercise/Activity/NMR: Janine Melbourne shoulder flexion with dowel x 10 Supine scap squeeze x 10 Supine cervical rotation AROM to tolerance Seated AAROM shoulder abd x 10 Seated cervical flex/ext ROM Seated row red TB Seated horizontal abd - attempted with red TB, pt unable so performed without resistance Backward shoulder rolls focus on posterior mm activation UT stretch bilat Manual Therapy: STM and TPR bilat cervical paraspinals and UT   TREATMENT DATE: 06/03/23 See HEP Pt educated on PT POC and goals, HEP, rationale for treatment  PATIENT EDUCATION:  Education details: Updated HEP Person educated: Patient Education method: Explanation, Demonstration, and Handouts Education comprehension: verbalized understanding and returned demonstration  HOME EXERCISE PROGRAM: Access Code: [email or web address] URL: [email or web address] Date: 06/11/2023 Prepared by: Sims Duck  Exercises - Seated Scapular Retraction  - 1 x daily - 7 x weekly - 2 sets - 10 reps - Seated Shoulder Abduction AAROM with Dowel  - 1 x daily - 7 x weekly - 2 sets - 10 reps - Supine Shoulder Flexion Extension AAROM with Dowel  - 1 x daily - 7 x weekly - 2 sets - 10 reps - Supine Cervical Retraction with Towel  - 1 x daily - 7 x weekly - 3 sets - 10 reps - Supine Cervical Rotation AROM on Pillow  - 1 x daily - 7 x weekly - 3 sets - 10 reps - Seated Diaphragmatic Breathing  - 1 x daily - 7 x weekly - 3 sets - 10 reps  ASSESSMENT:  CLINICAL IMPRESSION: Cervical strengthening and mobility exercises  progressed in reclined position and sitting as tolerated. Recommended patient incorporate log rolling transfers in bed to decrease tension/strain on neck. Small range thoracic extension performed in sitting, cueing neutral alignment with cervical spine. Breathing cues added with seated extension to promote diaphragm activation and lung function.   OBJECTIVE IMPAIRMENTS: decreased activity tolerance, decreased mobility, decreased ROM, decreased strength, increased muscle spasms, impaired sensation, impaired UE functional use, postural dysfunction, and pain.      GOALS: Goals reviewed with patient? Yes  SHORT TERM GOALS: Target date: 07/01/2023  Pt will be independent in initial HEP Baseline:  Goal status: INITIAL  2.  Pt will improve Lt UE AROM to 90 degrees flexion and 100 degrees abduction Baseline: see above Goal status: INITIAL  LONG TERM GOALS: Target date: 07/29/2023  Pt will be independent with advanced HEP Baseline:  Goal status: INITIAL  2.  NDI will improve to <= 16/50 to demo improved functional mobility Baseline: 26/50 Goal status: INITIAL  3.  Pt will improve Lt LE strength to 4/5 to improve functional activity tolerance Baseline: see above Goal status: INITIAL  4.  Pt will report 50% reduction in numbness in bilat UEs Baseline:  Goal status: INITIAL     PLAN:  PT FREQUENCY: 2x/week  PT DURATION: 8 weeks  PLANNED INTERVENTIONS: 97164- PT Re-evaluation, 97110-Therapeutic exercises, 97530- Therapeutic activity, 97112- Neuromuscular re-education, 97535- Self Care, 11914- Manual therapy, [phone]- Aquatic Therapy, A2130- Electrical stimulation (unattended), Patient/Family education, Taping, Dry Needling, Cryotherapy, and Moist heat  PLAN FOR NEXT SESSION: Postural strength, cervical ROM and strength   Flint Hummer, PTA 06/11/2023, 4:33 PM

## 2023-06-15 ENCOUNTER — Ambulatory Visit

## 2023-06-15 DIAGNOSIS — M542 Cervicalgia: Secondary | ICD-10-CM

## 2023-06-15 DIAGNOSIS — M4802 Spinal stenosis, cervical region: Secondary | ICD-10-CM

## 2023-06-16 ENCOUNTER — Ambulatory Visit: Admitting: Physical Therapy

## 2023-06-16 ENCOUNTER — Encounter: Payer: Self-pay | Admitting: Physical Therapy

## 2023-06-16 DIAGNOSIS — M6281 Muscle weakness (generalized): Secondary | ICD-10-CM

## 2023-06-16 DIAGNOSIS — R29898 Other symptoms and signs involving the musculoskeletal system: Secondary | ICD-10-CM

## 2023-06-16 NOTE — Therapy (Signed)
 OUTPATIENT PHYSICAL THERAPY CERVICAL TREATMENT   Patient Name: Damon Acosta MRN: [id number] DOB:10-24-1940, 83 y.o., male Today's Date: 06/16/2023  END OF SESSION:  PT End of Session - 06/16/23 1142     Visit Number 4    Number of Visits 16    Date for PT Re-Evaluation 07/29/23    Authorization Type Medicare    Authorization - Visit Number 4    Progress Note Due on Visit 10    PT Start Time 1100    PT Stop Time 1142    PT Time Calculation (min) 42 min    Activity Tolerance Patient tolerated treatment well    Behavior During Therapy [facility] for tasks assessed/performed             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   CKD (chronic kidney disease)    Complication of anesthesia    patient had dificulty waking up   Coronary artery disease    Diabetes mellitus without complication ([name])    Gout    Hyperlipidemia    Hypertension    Myocardial infarction ([name])    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   colonic pseudo-obstruction, s/p colectomy with ileostomy 12/2013   Overactive bladder    Sleep apnea    Stroke [facility])    Past Surgical History:  Procedure Laterality Date   CHOLECYSTECTOMY     COLOSTOMY     POSTERIOR CERVICAL LAMINECTOMY N/A 02/09/2023   Procedure: Laminectomy  Cervical two - Cervical seven;  Surgeon: Agustina Aldrich, MD;  Location: [facility] OR;  Service: Neurosurgery;  Laterality: N/A;   PROSTATE SURGERY     Patient Active Problem List   Diagnosis Date Noted   Cervical spondylosis with myelopathy and radiculopathy 02/09/2023   Atopic dermatitis 10/28/2022   Throat clearing 09/16/2022   Dyspnea 07/07/2022   Urinary retention 06/04/2022   Cognitive change 06/04/2022   Insomnia 03/17/2022   Callus of foot, right heel 12/11/2021    Cervical cord myelomalacia ([name]) 10/30/2021   AV block, 1st degree 08/14/2021   Sinus bradycardia on ECG 08/14/2021   OSA (obstructive sleep apnea) 08/01/2021   History of MI (myocardial infarction) 10/21/2019   Diversion colitis 10/07/2019   Gastroesophageal reflux disease without esophagitis 10/07/2019   PAF (paroxysmal atrial fibrillation) ([name]) 09/16/2019   Atrial flutter ([name]) 09/15/2019   NSTEMI (non-ST elevated myocardial infarction) ([name]) 09/15/2019   Recurrent major depressive disorder, in partial remission ([name]) 09/15/2019   Coronary artery disease of native artery of native heart with stable angina pectoris ([name]) 09/15/2019   Severe episode of recurrent major depressive disorder, without psychotic features ([name]) 09/15/2019   Asymptomatic PVCs 07/27/2018   CKD (chronic kidney disease) stage 3, GFR 30-59 ml/min ([name]) 04/27/2018   Mild cognitive impairment 04/20/2018   Microcytic anemia 04/20/2018   Primary osteoarthritis of both knees 02/17/2018   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   Type 2 diabetes mellitus with chronic kidney disease, without long-term current use of insulin  ([name]) 01/06/2018   Dyslipidemia associated with type 2 diabetes mellitus ([name]) 01/06/2018   OAB (overactive bladder) 01/06/2018   History of prostate cancer 01/06/2018   Lumbar spinal stenosis 12/15/2017   Colostomy status ([name]) 12/15/2017   Hypertension associated with diabetes ([name]) 12/15/2017   Gout  12/15/2017   History of CVA (cerebrovascular accident) 12/15/2017   Ileostomy in place [facility]) 12/15/2017    PCP: Vincenza Greener PROVIDER: Agustina Aldrich  REFERRING DIAG: cervical spinal stenosis with myelopathy  THERAPY DIAG:  Muscle weakness (generalized)  Other symptoms and signs involving the musculoskeletal system  Rationale for Evaluation and Treatment: Rehabilitation  ONSET DATE: 02/09/24  SUBJECTIVE:                                                                                                                                                                                                          SUBJECTIVE STATEMENT: Patient reports he feels "a little looser". He comes in using PFRW today  PERTINENT HISTORY:  Lumbar spinal stenosis CVA CKD NSTEMI  Pt is s/p cervical laminectomy C2-C7 on 02/09/24. Since the surgery he continues with pain in his neck and with Lt UE weakness and bilat UE numbness/tingling. Prior to surgery he used a PFRW for ambulation but he has had a few recent falls and is now using a power w/c for mobility in and out of the house. Pt reports difficulty using Lt UE for functional activities. He reports he drops items often due to numbness in hands. Numbness does not get better or worse, it is just "always there".  Hand dominance: Right  PAIN:  Are you having pain? Yes: NPRS scale: 6/10 currently, 9/10 at worst Pain location: neck, LT UE Pain description: sore, ache Aggravating factors: increased use of Lt UE Relieving factors: meds  PRECAUTIONS: Fall  RED FLAGS: None     WEIGHT BEARING RESTRICTIONS: No  FALLS:  Has patient fallen in last 6 months? Yes. Number of falls 2-3  LIVING ENVIRONMENT: Lives with: lives with their family Lives in: House/apartment Stairs: No Has following equipment at home: Environmental consultant - 2 wheeled and Wheelchair (power)  OCCUPATION: retired  PLOF: Independent with basic ADLs, Independent with household mobility with device, and Requires assistive device for independence  PATIENT GOALS: decrease pain, get Lt UE to work, decrease numbness  NEXT MD VISIT: none on file  OBJECTIVE:  Note: Objective measures were completed at Evaluation unless otherwise noted.  DIAGNOSTIC FINDINGS:  None on file since surgery  PATIENT SURVEYS:  NDI 26/50 - 52%  COGNITION: Overall cognitive status: History of cognitive impairments - at baseline  SENSATION: Light touch: Impaired   POSTURE: forward head  PALPATION: Increased  mm spasticity Rt > Lt cervical paraspinals Increased tenderness to light touch throughout cervical musculature, UT bilat Some decreased symptoms with manual traction   CERVICAL ROM:  Active ROM A/PROM (deg) eval  Flexion 34  Extension 14 pain  Right lateral flexion 35  Left lateral flexion 25  Right rotation 38  Left rotation 40   (Blank rows = not tested)  UPPER EXTREMITY MMT:  MMT Right eval Left eval  Shoulder flexion 4 3-  Shoulder extension    Shoulder abduction 4 3-  Shoulder adduction    Shoulder extension    Shoulder internal rotation    Shoulder external rotation    Elbow flexion 4+ 3+  Elbow extension 4+ 3  Wrist flexion    Wrist extension    Wrist ulnar deviation    Wrist radial deviation    Wrist pronation    GRIP STRENGTH 50 19   (Blank rows = not tested)  UPPER EXTREMITY ROM:  Active ROM Right eval Left eval  Shoulder flexion WFL 60  Shoulder extension    Shoulder abduction WFL 85 pain  Shoulder adduction    Shoulder extension    Shoulder internal rotation    Shoulder external rotation    Elbow flexion    Elbow extension    Wrist flexion    Wrist extension    Wrist ulnar deviation    Wrist radial deviation    Wrist pronation    Wrist supination     (Blank rows = not tested)  CERVICAL SPECIAL TESTS:  Spurling's test: Positive   [facility] Adult PT Treatment:                                                DATE: 06/16/23 Therapeutic Exercise/Activity/NMR: Supine with head elevated and pillow Shoulder AAROM flexion with dowel 2 x 5 Shoulder AAROM abduction with dowel x 10 Cervical rotation AROM Cervical retraction with towel roll behind neck Scap squeeze focus on chest elevation Seated Thoracic rotation Backward shoulder rolls Seated with noodle: Thoracic extension with breathing cues to improve stretch and mobility Horizontal abd yellow TB Rows red TB Scap squeeze Manual Therapy: STM bilat UT, levator, cervical paraspinals,  suboccipitals   [facility] Adult PT Treatment:                                                DATE: 06/11/2023 Therapeutic Exercise: Supine + 2 pillows & head of table raised: AAROM shoulder flexion with dowel --> cueing active stretch at end range AAROM shoulder abduction with cane  Cervical rotation AROM Seated: Small range thoracic extension against horizontal noodle --> tried a few with deflated coregeous ball (noodle felt better) Manual Therapy: STM cervical paraspinals, anterior scalenes, UT/LS, cervical upglides, suboccipitals, SOR  Neuromuscular re-ed: Supine + 2 pillows & head of table raised: Cervical retraction with towel roll behind neck Shoulder horizontal abduction with YTB + breathing cues  Seated thoracic extension (small range) + breathing cues to promote diaphragm activation Self Care: Log rolling --> bed mobility    [facility] Adult PT Treatment:                                                DATE: 06/09/23 Therapeutic Exercise/Activity/NMR: Janine Melbourne shoulder flexion with dowel x  10 Supine scap squeeze x 10 Supine cervical rotation AROM to tolerance Seated AAROM shoulder abd x 10 Seated cervical flex/ext ROM Seated row red TB Seated horizontal abd - attempted with red TB, pt unable so performed without resistance Backward shoulder rolls focus on posterior mm activation UT stretch bilat Manual Therapy: STM and TPR bilat cervical paraspinals and UT    PATIENT EDUCATION:  Education details: Updated HEP Person educated: Patient Education method: Explanation, Demonstration, and Handouts Education comprehension: verbalized understanding and returned demonstration  HOME EXERCISE PROGRAM: Access Code: [email or web address] URL: [email or web address] Date: 06/16/2023 Prepared by: Lowery Rue  Exercises - Seated Scapular Retraction  - 1 x daily - 7 x weekly - 2 sets - 10 reps - Seated Shoulder Abduction AAROM with Dowel  - 1 x daily - 7 x weekly - 2 sets - 10 reps -  Supine Shoulder Flexion Extension AAROM with Dowel  - 1 x daily - 7 x weekly - 2 sets - 10 reps - Supine Cervical Retraction with Towel  - 1 x daily - 7 x weekly - 3 sets - 10 reps - Supine Cervical Rotation AROM on Pillow  - 1 x daily - 7 x weekly - 3 sets - 10 reps - Seated Diaphragmatic Breathing  - 1 x daily - 7 x weekly - 3 sets - 10 reps - Seated Shoulder Row with Anchored Resistance  - 1 x daily - 7 x weekly - 3 sets - 10 reps - Seated Thoracic Extension with Hands Behind Neck  - 1 x daily - 7 x weekly - 1 sets - 10 reps  ASSESSMENT:  CLINICAL IMPRESSION: Pt is improving tolerance to postural strengthening and thoracic mobility. Updated HEP to continue to progress at home. He continues with muscle spasms which respond well to manual therapy during session. PT encouraged pt to stretch often at home to maintain and improve gains made during session   OBJECTIVE IMPAIRMENTS: decreased activity tolerance, decreased mobility, decreased ROM, decreased strength, increased muscle spasms, impaired sensation, impaired UE functional use, postural dysfunction, and pain.      GOALS: Goals reviewed with patient? Yes  SHORT TERM GOALS: Target date: 07/01/2023  Pt will be independent in initial HEP Baseline:  Goal status: INITIAL  2.  Pt will improve Lt UE AROM to 90 degrees flexion and 100 degrees abduction Baseline: see above Goal status: INITIAL  LONG TERM GOALS: Target date: 07/29/2023  Pt will be independent with advanced HEP Baseline:  Goal status: INITIAL  2.  NDI will improve to <= 16/50 to demo improved functional mobility Baseline: 26/50 Goal status: INITIAL  3.  Pt will improve Lt LE strength to 4/5 to improve functional activity tolerance Baseline: see above Goal status: INITIAL  4.  Pt will report 50% reduction in numbness in bilat UEs Baseline:  Goal status: INITIAL     PLAN:  PT FREQUENCY: 2x/week  PT DURATION: 8 weeks  PLANNED INTERVENTIONS: 97164- PT  Re-evaluation, 97110-Therapeutic exercises, 97530- Therapeutic activity, [id number]- Neuromuscular re-education, 97535- Self Care, [id number]- Manual therapy, [id number]- Aquatic Therapy, [id number]- Electrical stimulation (unattended), Patient/Family education, Taping, Dry Needling, Cryotherapy, and Moist heat  PLAN FOR NEXT SESSION: Postural strength, cervical ROM and strength   Eloy Fehl, PT 06/16/2023, 11:43 AM

## 2023-06-18 ENCOUNTER — Ambulatory Visit

## 2023-06-18 DIAGNOSIS — M6281 Muscle weakness (generalized): Secondary | ICD-10-CM

## 2023-06-18 DIAGNOSIS — R29898 Other symptoms and signs involving the musculoskeletal system: Secondary | ICD-10-CM

## 2023-06-18 NOTE — Therapy (Signed)
 OUTPATIENT PHYSICAL THERAPY CERVICAL TREATMENT   Patient Name: Damon Acosta MRN: [id number] DOB:[birth year], 83 y.o., male Today's Date: 06/18/2023  END OF SESSION:  PT End of Session - 06/18/23 1404     Visit Number 5    Number of Visits 16    Date for PT Re-Evaluation 07/29/23    Authorization Type Medicare    Authorization - Visit Number 5    Progress Note Due on Visit 10    PT Start Time 1404    PT Stop Time 1450    PT Time Calculation (min) 46 min    Activity Tolerance Patient tolerated treatment well    Behavior During Therapy [facility] for tasks assessed/performed             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   CKD (chronic kidney disease)    Complication of anesthesia    patient had dificulty waking up   Coronary artery disease    Diabetes mellitus without complication ([name])    Gout    Hyperlipidemia    Hypertension    Myocardial infarction ([name])    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   colonic pseudo-obstruction, s/p colectomy with ileostomy 12/2013   Overactive bladder    Sleep apnea    Stroke [facility])    Past Surgical History:  Procedure Laterality Date   CHOLECYSTECTOMY     COLOSTOMY     POSTERIOR CERVICAL LAMINECTOMY N/A 02/09/2023   Procedure: Laminectomy  Cervical two - Cervical seven;  Surgeon: Agustina Aldrich, MD;  Location: [facility] OR;  Service: Neurosurgery;  Laterality: N/A;   PROSTATE SURGERY     Patient Active Problem List   Diagnosis Date Noted   Cervical spondylosis with myelopathy and radiculopathy 02/09/2023   Atopic dermatitis 10/28/2022   Throat clearing 09/16/2022   Dyspnea 07/07/2022   Urinary retention 06/04/2022   Cognitive change 06/04/2022   Insomnia 03/17/2022   Callus of foot, right heel 12/11/2021    Cervical cord myelomalacia ([name]) 10/30/2021   AV block, 1st degree 08/14/2021   Sinus bradycardia on ECG 08/14/2021   OSA (obstructive sleep apnea) 08/01/2021   History of MI (myocardial infarction) 10/21/2019   Diversion colitis 10/07/2019   Gastroesophageal reflux disease without esophagitis 10/07/2019   PAF (paroxysmal atrial fibrillation) ([name]) 09/16/2019   Atrial flutter ([name]) 09/15/2019   NSTEMI (non-ST elevated myocardial infarction) ([name]) 09/15/2019   Recurrent major depressive disorder, in partial remission ([name]) 09/15/2019   Coronary artery disease of native artery of native heart with stable angina pectoris ([name]) 09/15/2019   Severe episode of recurrent major depressive disorder, without psychotic features ([name]) 09/15/2019   Asymptomatic PVCs 07/27/2018   CKD (chronic kidney disease) stage 3, GFR 30-59 ml/min ([name]) 04/27/2018   Mild cognitive impairment 04/20/2018   Microcytic anemia 04/20/2018   Primary osteoarthritis of both knees 02/17/2018   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   Type 2 diabetes mellitus with chronic kidney disease, without long-term current use of insulin  ([name]) 01/06/2018   Dyslipidemia associated with type 2 diabetes mellitus ([name]) 01/06/2018   OAB (overactive bladder) 01/06/2018   History of prostate cancer 01/06/2018   Lumbar spinal stenosis 12/15/2017   Colostomy status ([name]) 12/15/2017   Hypertension associated with diabetes ([name]) 12/15/2017   Gout  12/15/2017   History of CVA (cerebrovascular accident) 12/15/2017   Ileostomy in place [facility]) 12/15/2017    PCP: Vincenza Greener PROVIDER: Agustina Aldrich  REFERRING DIAG: cervical spinal stenosis with myelopathy  THERAPY DIAG:  Muscle weakness (generalized)  Other symptoms and signs involving the musculoskeletal system  Rationale for Evaluation and Treatment: Rehabilitation  ONSET DATE: 02/09/24  SUBJECTIVE:                                                                                                                                                                                                          SUBJECTIVE STATEMENT: Patient reports he is doing exercises at home and is feeling better; patient arrived with upright walker.  PERTINENT HISTORY:  Lumbar spinal stenosis CVA CKD NSTEMI  Pt is s/p cervical laminectomy C2-C7 on 02/09/24. Since the surgery he continues with pain in his neck and with Lt UE weakness and bilat UE numbness/tingling. Prior to surgery he used a PFRW for ambulation but he has had a few recent falls and is now using a power w/c for mobility in and out of the house. Pt reports difficulty using Lt UE for functional activities. He reports he drops items often due to numbness in hands. Numbness does not get better or worse, it is just "always there".  Hand dominance: Right  PAIN:  Are you having pain? Yes: NPRS scale: 6/10 currently, 9/10 at worst Pain location: neck, LT UE Pain description: sore, ache Aggravating factors: increased use of Lt UE Relieving factors: meds  PRECAUTIONS: Fall  RED FLAGS: None     WEIGHT BEARING RESTRICTIONS: No  FALLS:  Has patient fallen in last 6 months? Yes. Number of falls 2-3  LIVING ENVIRONMENT: Lives with: lives with their family Lives in: House/apartment Stairs: No Has following equipment at home: Environmental consultant - 2 wheeled and Wheelchair (power)  OCCUPATION: retired  PLOF: Independent with basic ADLs, Independent with household mobility with device, and Requires assistive device for independence  PATIENT GOALS: decrease pain, get Lt UE to work, decrease numbness  NEXT MD VISIT: none on file  OBJECTIVE:  Note: Objective measures were completed at Evaluation unless otherwise noted.  DIAGNOSTIC FINDINGS:  None on file since surgery  PATIENT SURVEYS:  NDI 26/50 - 52%  COGNITION: Overall cognitive status: History of cognitive impairments - at baseline  SENSATION: Light touch: Impaired   POSTURE:  forward head  PALPATION: Increased mm spasticity Rt > Lt cervical paraspinals Increased tenderness to light touch throughout cervical musculature, UT bilat Some decreased symptoms with manual traction  CERVICAL ROM:   Active ROM A/PROM (deg) eval  Flexion 34  Extension 14 pain  Right lateral flexion 35  Left lateral flexion 25  Right rotation 38  Left rotation 40   (Blank rows = not tested)  UPPER EXTREMITY MMT:  MMT Right eval Left eval  Shoulder flexion 4 3-  Shoulder extension    Shoulder abduction 4 3-  Shoulder adduction    Shoulder extension    Shoulder internal rotation    Shoulder external rotation    Elbow flexion 4+ 3+  Elbow extension 4+ 3  Wrist flexion    Wrist extension    Wrist ulnar deviation    Wrist radial deviation    Wrist pronation    GRIP STRENGTH 50 19   (Blank rows = not tested)  UPPER EXTREMITY ROM:  Active ROM Right eval Left eval  Shoulder flexion WFL 60  Shoulder extension    Shoulder abduction WFL 85 pain  Shoulder adduction    Shoulder extension    Shoulder internal rotation    Shoulder external rotation    Elbow flexion    Elbow extension    Wrist flexion    Wrist extension    Wrist ulnar deviation    Wrist radial deviation    Wrist pronation    Wrist supination     (Blank rows = not tested)  CERVICAL SPECIAL TESTS:  Spurling's test: Positive    [facility] Adult PT Treatment:                                                DATE: 06/18/2023 Therapeutic Exercise: Seated: Bkwd shoulder circles  Chin tucks Passive cervical rotation stretch in supine Neuromuscular re-ed: Supine on incline with pillow & towel roll (gradually lowering incline): Scap retraction + chest lift Cervical retraction  T arm open + 1#DB Bent elbow reach up + 1#DB --> hands on front of shoulders (thoracic extension) Modified dead bug Standing with noodle at wall: Cervical retraction with hand/ball cue Low shoulder horizontal abduction +  1#DB   [facility] Adult PT Treatment:                                                DATE: 06/16/23 Therapeutic Exercise/Activity/NMR: Supine with head elevated and pillow Shoulder AAROM flexion with dowel 2 x 5 Shoulder AAROM abduction with dowel x 10 Cervical rotation AROM Cervical retraction with towel roll behind neck Scap squeeze focus on chest elevation Seated Thoracic rotation Backward shoulder rolls Seated with noodle: Thoracic extension with breathing cues to improve stretch and mobility Horizontal abd yellow TB Rows red TB Scap squeeze Manual Therapy: STM bilat UT, levator, cervical paraspinals, suboccipitals   [facility] Adult PT Treatment:                                                DATE: 06/11/2023 Therapeutic Exercise: Supine + 2 pillows & head of table raised: AAROM shoulder flexion with dowel --> cueing active stretch at end range AAROM shoulder abduction with cane  Cervical rotation AROM Seated: Small range thoracic extension against horizontal noodle -->  tried a few with deflated coregeous ball (noodle felt better) Manual Therapy: STM cervical paraspinals, anterior scalenes, UT/LS, cervical upglides, suboccipitals, SOR  Neuromuscular re-ed: Supine + 2 pillows & head of table raised: Cervical retraction with towel roll behind neck Shoulder horizontal abduction with YTB + breathing cues  Seated thoracic extension (small range) + breathing cues to promote diaphragm activation Self Care: Log rolling --> bed mobility   PATIENT EDUCATION:  Education details: Updated HEP Person educated: Patient Education method: Explanation, Demonstration, and Handouts Education comprehension: verbalized understanding and returned demonstration  HOME EXERCISE PROGRAM: Access Code: [email or web address] URL: [email or web address] Date: 06/18/2023 Prepared by: Sims Duck  Exercises - Seated Scapular Retraction  - 1 x daily - 7 x weekly - 2 sets - 10 reps - Seated Shoulder  Abduction AAROM with Dowel  - 1 x daily - 7 x weekly - 2 sets - 10 reps - Supine Shoulder Flexion Extension AAROM with Dowel  - 1 x daily - 7 x weekly - 2 sets - 10 reps - Supine Cervical Retraction with Towel  - 1 x daily - 7 x weekly - 3 sets - 10 reps - Supine Cervical Rotation AROM on Pillow  - 1 x daily - 7 x weekly - 3 sets - 10 reps - Seated Diaphragmatic Breathing  - 1 x daily - 7 x weekly - 3 sets - 10 reps - Seated Shoulder Row with Anchored Resistance  - 1 x daily - 7 x weekly - 3 sets - 10 reps - Seated Thoracic Extension with Hands Behind Neck  - 1 x daily - 7 x weekly - 1 sets - 10 reps - Standing Scapular Retraction  - 1 x daily - 7 x weekly - 3 sets - 10 reps - Standing with Back Flat Against Wall  - 1 x daily - 7 x weekly - 3 sets - 10 reps - Supine Elbow Flexion Extension AROM  - 1 x daily - 7 x weekly - 3 sets - 10 reps - Dead Bug  - 1 x daily - 7 x weekly - 3 sets - 10 reps  ASSESSMENT:  CLINICAL IMPRESSION: Inclined table gradually lowered during supine exercises as tolerated by patient to promote neutral postural alignment. Tactile cues improved cervical retraction mechanics and decreased cervical nod compensation. Postural exercises progressed to standing; patient able to complete exercises, however fatigues quickly.   OBJECTIVE IMPAIRMENTS: decreased activity tolerance, decreased mobility, decreased ROM, decreased strength, increased muscle spasms, impaired sensation, impaired UE functional use, postural dysfunction, and pain.      GOALS: Goals reviewed with patient? Yes  SHORT TERM GOALS: Target date: 07/01/2023  Pt will be independent in initial HEP Baseline:  Goal status: INITIAL  2.  Pt will improve Lt UE AROM to 90 degrees flexion and 100 degrees abduction Baseline: see above Goal status: INITIAL  LONG TERM GOALS: Target date: 07/29/2023  Pt will be independent with advanced HEP Baseline:  Goal status: INITIAL  2.  NDI will improve to <= 16/50 to  demo improved functional mobility Baseline: 26/50 Goal status: INITIAL  3.  Pt will improve Lt LE strength to 4/5 to improve functional activity tolerance Baseline: see above Goal status: INITIAL  4.  Pt will report 50% reduction in numbness in bilat UEs Baseline:  Goal status: INITIAL     PLAN:  PT FREQUENCY: 2x/week  PT DURATION: 8 weeks  PLANNED INTERVENTIONS: [id number]- PT Re-evaluation, 97110-Therapeutic exercises, 97530- Therapeutic activity, [id number]- Neuromuscular re-education, 97535- Self Care, [id number]-  Manual therapy, [id number]- Aquatic Therapy, [phone]- Electrical stimulation (unattended), Patient/Family education, Taping, Dry Needling, Cryotherapy, and Moist heat  PLAN FOR NEXT SESSION: Postural strength, cervical ROM and strength; gradually lower incline as tolerated; standing exercises as tolerated.   Flint Hummer, PTA 06/18/2023, 4:06 PM

## 2023-06-23 ENCOUNTER — Encounter: Payer: Self-pay | Admitting: Physical Therapy

## 2023-06-23 ENCOUNTER — Ambulatory Visit: Admitting: Physical Therapy

## 2023-06-23 DIAGNOSIS — M6281 Muscle weakness (generalized): Secondary | ICD-10-CM | POA: Diagnosis not present

## 2023-06-23 DIAGNOSIS — R29898 Other symptoms and signs involving the musculoskeletal system: Secondary | ICD-10-CM | POA: Diagnosis not present

## 2023-06-23 NOTE — Therapy (Signed)
 OUTPATIENT PHYSICAL THERAPY CERVICAL TREATMENT   Patient Name: Damon Acosta MRN: [id number] DOB:[birth year], 83 y.o., male Today's Date: 06/23/2023  END OF SESSION:  PT End of Session - 06/23/23 1358     Visit Number 6    Number of Visits 16    Date for PT Re-Evaluation 07/29/23    Authorization Type Medicare    Authorization - Visit Number 6    Progress Note Due on Visit 10    PT Start Time 1315    PT Stop Time 1356    PT Time Calculation (min) 41 min    Activity Tolerance Patient tolerated treatment well    Behavior During Therapy [facility] for tasks assessed/performed              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   CKD (chronic kidney disease)    Complication of anesthesia    patient had dificulty waking up   Coronary artery disease    Diabetes mellitus without complication ([name])    Gout    Hyperlipidemia    Hypertension    Myocardial infarction ([name])    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   colonic pseudo-obstruction, s/p colectomy with ileostomy 12/2013   Overactive bladder    Sleep apnea    Stroke [facility])    Past Surgical History:  Procedure Laterality Date   CHOLECYSTECTOMY     COLOSTOMY     POSTERIOR CERVICAL LAMINECTOMY N/A 02/09/2023   Procedure: Laminectomy  Cervical two - Cervical seven;  Surgeon: Agustina Aldrich, MD;  Location: [facility] OR;  Service: Neurosurgery;  Laterality: N/A;   PROSTATE SURGERY     Patient Active Problem List   Diagnosis Date Noted   Cervical spondylosis with myelopathy and radiculopathy 02/09/2023   Atopic dermatitis 10/28/2022   Throat clearing 09/16/2022   Dyspnea 07/07/2022   Urinary retention 06/04/2022   Cognitive change 06/04/2022   Insomnia 03/17/2022   Callus of foot, right heel  12/11/2021   Cervical cord myelomalacia ([name]) 10/30/2021   AV block, 1st degree 08/14/2021   Sinus bradycardia on ECG 08/14/2021   OSA (obstructive sleep apnea) 08/01/2021   History of MI (myocardial infarction) 10/21/2019   Diversion colitis 10/07/2019   Gastroesophageal reflux disease without esophagitis 10/07/2019   PAF (paroxysmal atrial fibrillation) ([name]) 09/16/2019   Atrial flutter ([name]) 09/15/2019   NSTEMI (non-ST elevated myocardial infarction) ([name]) 09/15/2019   Recurrent major depressive disorder, in partial remission ([name]) 09/15/2019   Coronary artery disease of native artery of native heart with stable angina pectoris ([name]) 09/15/2019   Severe episode of recurrent major depressive disorder, without psychotic features ([name]) 09/15/2019   Asymptomatic PVCs 07/27/2018   CKD (chronic kidney disease) stage 3, GFR 30-59 ml/min ([name]) 04/27/2018   Mild cognitive impairment 04/20/2018   Microcytic anemia 04/20/2018   Primary osteoarthritis of both knees 02/17/2018   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   Type 2 diabetes mellitus with chronic kidney disease, without long-term current use of insulin  ([name]) 01/06/2018   Dyslipidemia associated with type 2 diabetes mellitus ([name]) 01/06/2018   OAB (overactive bladder) 01/06/2018   History of prostate cancer 01/06/2018   Lumbar spinal stenosis 12/15/2017   Colostomy status ([name]) 12/15/2017   Hypertension associated with diabetes ([name]) 12/15/2017  Gout 12/15/2017   History of CVA (cerebrovascular accident) 12/15/2017   Ileostomy in place [facility]) 12/15/2017    PCP: Vincenza Greener PROVIDER: Agustina Aldrich  REFERRING DIAG: cervical spinal stenosis with myelopathy  THERAPY DIAG:  Muscle weakness (generalized)  Other symptoms and signs involving the musculoskeletal system  Rationale for Evaluation and Treatment: Rehabilitation  ONSET DATE: 02/09/24  SUBJECTIVE:                                                                                                                                                                                                          SUBJECTIVE STATEMENT: Pt states his neck is "stiff, but not as bad as it was"  PERTINENT HISTORY:  Lumbar spinal stenosis CVA CKD NSTEMI  Pt is s/p cervical laminectomy C2-C7 on 02/09/24. Since the surgery he continues with pain in his neck and with Lt UE weakness and bilat UE numbness/tingling. Prior to surgery he used a PFRW for ambulation but he has had a few recent falls and is now using a power w/c for mobility in and out of the house. Pt reports difficulty using Lt UE for functional activities. He reports he drops items often due to numbness in hands. Numbness does not get better or worse, it is just "always there".  Hand dominance: Right  PAIN:  Are you having pain? Yes: NPRS scale: 5/10 currently, 9/10 at worst Pain location: neck, LT UE Pain description: sore, ache Aggravating factors: increased use of Lt UE Relieving factors: meds  PRECAUTIONS: Fall  RED FLAGS: None     WEIGHT BEARING RESTRICTIONS: No  FALLS:  Has patient fallen in last 6 months? Yes. Number of falls 2-3  LIVING ENVIRONMENT: Lives with: lives with their family Lives in: House/apartment Stairs: No Has following equipment at home: Environmental consultant - 2 wheeled and Wheelchair (power)  OCCUPATION: retired  PLOF: Independent with basic ADLs, Independent with household mobility with device, and Requires assistive device for independence  PATIENT GOALS: decrease pain, get Lt UE to work, decrease numbness  NEXT MD VISIT: none on file  OBJECTIVE:  Note: Objective measures were completed at Evaluation unless otherwise noted.  DIAGNOSTIC FINDINGS:  None on file since surgery  PATIENT SURVEYS:  NDI 26/50 - 52%  COGNITION: Overall cognitive status: History of cognitive impairments - at baseline  SENSATION: Light touch: Impaired   POSTURE: forward head  PALPATION: Increased mm  spasticity Rt > Lt cervical paraspinals Increased tenderness to light touch throughout cervical musculature, UT bilat Some decreased symptoms with manual traction   CERVICAL ROM:  Active ROM A/PROM (deg) eval  Flexion 34  Extension 14 pain  Right lateral flexion 35  Left lateral flexion 25  Right rotation 38  Left rotation 40   (Blank rows = not tested)  UPPER EXTREMITY MMT:  MMT Right eval Left eval  Shoulder flexion 4 3-  Shoulder extension    Shoulder abduction 4 3-  Shoulder adduction    Shoulder extension    Shoulder internal rotation    Shoulder external rotation    Elbow flexion 4+ 3+  Elbow extension 4+ 3  Wrist flexion    Wrist extension    Wrist ulnar deviation    Wrist radial deviation    Wrist pronation    GRIP STRENGTH 50 19   (Blank rows = not tested)  UPPER EXTREMITY ROM:  Active ROM Right eval Left eval  Shoulder flexion WFL 60  Shoulder extension    Shoulder abduction WFL 85 pain  Shoulder adduction    Shoulder extension    Shoulder internal rotation    Shoulder external rotation    Elbow flexion    Elbow extension    Wrist flexion    Wrist extension    Wrist ulnar deviation    Wrist radial deviation    Wrist pronation    Wrist supination     (Blank rows = not tested)  CERVICAL SPECIAL TESTS:  Spurling's test: Positive    [facility] Adult PT Treatment:                                                DATE: 06/23/23 Therapeutic Exercise/Activity: Supine on incline with pillow and towel roll Scap retraction and chest lift Bear hug Alt shoulder flexion (AAROM on Lt) Bent elbow reach up for thoracic extension Seated with back against noodle Backward shoulder rolls Chin tucks Horizontal abd yellow TB Bilat ER yellow TB Cervical retraction with cues Modified dead bug  Standing  Shoulder flexion wall slide Lt x 4 Back against noodle: scap squeeze with chest elevation, cervical retraction with ball cue  Manual Therapy: Cervical  PROM all directions   [facility] Adult PT Treatment:                                                DATE: 06/18/2023 Therapeutic Exercise: Seated: Bkwd shoulder circles  Chin tucks Passive cervical rotation stretch in supine Neuromuscular re-ed: Supine on incline with pillow & towel roll (gradually lowering incline): Scap retraction + chest lift Cervical retraction  T arm open + 1#DB Bent elbow reach up + 1#DB --> hands on front of shoulders (thoracic extension) Modified dead bug Standing with noodle at wall: Cervical retraction with hand/ball cue Low shoulder horizontal abduction + 1#DB   [facility] Adult PT Treatment:                                                DATE: 06/16/23 Therapeutic Exercise/Activity/NMR: Supine with head elevated and pillow Shoulder AAROM flexion with dowel 2 x 5 Shoulder AAROM abduction with dowel x 10 Cervical rotation AROM Cervical retraction with towel roll behind neck Scap squeeze  focus on chest elevation Seated Thoracic rotation Backward shoulder rolls Seated with noodle: Thoracic extension with breathing cues to improve stretch and mobility Horizontal abd yellow TB Rows red TB Scap squeeze Manual Therapy: STM bilat UT, levator, cervical paraspinals, suboccipitals     PATIENT EDUCATION:  Education details: Updated HEP Person educated: Patient Education method: Explanation, Demonstration, and Handouts Education comprehension: verbalized understanding and returned demonstration  HOME EXERCISE PROGRAM: Access Code: [email or web address] URL: [email or web address] Date: 06/18/2023 Prepared by: Sims Duck  Exercises - Seated Scapular Retraction  - 1 x daily - 7 x weekly - 2 sets - 10 reps - Seated Shoulder Abduction AAROM with Dowel  - 1 x daily - 7 x weekly - 2 sets - 10 reps - Supine Shoulder Flexion Extension AAROM with Dowel  - 1 x daily - 7 x weekly - 2 sets - 10 reps - Supine Cervical Retraction with Towel  - 1 x daily - 7 x weekly - 3  sets - 10 reps - Supine Cervical Rotation AROM on Pillow  - 1 x daily - 7 x weekly - 3 sets - 10 reps - Seated Diaphragmatic Breathing  - 1 x daily - 7 x weekly - 3 sets - 10 reps - Seated Shoulder Row with Anchored Resistance  - 1 x daily - 7 x weekly - 3 sets - 10 reps - Seated Thoracic Extension with Hands Behind Neck  - 1 x daily - 7 x weekly - 1 sets - 10 reps - Standing Scapular Retraction  - 1 x daily - 7 x weekly - 3 sets - 10 reps - Standing with Back Flat Against Wall  - 1 x daily - 7 x weekly - 3 sets - 10 reps - Supine Elbow Flexion Extension AROM  - 1 x daily - 7 x weekly - 3 sets - 10 reps - Dead Bug  - 1 x daily - 7 x weekly - 3 sets - 10 reps  ASSESSMENT:  CLINICAL IMPRESSION: Pt is improving tolerance to postural exercises. He has difficulty with wall slide for LT shoulder flexion and this was added to HEP. Continued to focus on thoracic extension and cervical retraction to improve posture and reduce pain   OBJECTIVE IMPAIRMENTS: decreased activity tolerance, decreased mobility, decreased ROM, decreased strength, increased muscle spasms, impaired sensation, impaired UE functional use, postural dysfunction, and pain.      GOALS: Goals reviewed with patient? Yes  SHORT TERM GOALS: Target date: 07/01/2023  Pt will be independent in initial HEP Baseline:  Goal status: INITIAL  2.  Pt will improve Lt UE AROM to 90 degrees flexion and 100 degrees abduction Baseline: see above Goal status: INITIAL  LONG TERM GOALS: Target date: 07/29/2023  Pt will be independent with advanced HEP Baseline:  Goal status: INITIAL  2.  NDI will improve to <= 16/50 to demo improved functional mobility Baseline: 26/50 Goal status: INITIAL  3.  Pt will improve Lt LE strength to 4/5 to improve functional activity tolerance Baseline: see above Goal status: INITIAL  4.  Pt will report 50% reduction in numbness in bilat UEs Baseline:  Goal status: INITIAL     PLAN:  PT  FREQUENCY: 2x/week  PT DURATION: 8 weeks  PLANNED INTERVENTIONS: 97164- PT Re-evaluation, 97110-Therapeutic exercises, 97530- Therapeutic activity, [id number]- Neuromuscular re-education, 97535- Self Care, [id number]- Manual therapy, [id number]- Aquatic Therapy, [id number]- Electrical stimulation (unattended), Patient/Family education, Taping, Dry Needling, Cryotherapy, and Moist heat  PLAN FOR NEXT SESSION: CHECK GOALS,Postural strength, cervical ROM  and strength; gradually lower incline as tolerated; standing exercises as tolerated.   Modesty Rudy, PT 06/23/2023, 1:59 PM

## 2023-06-24 IMAGING — DX DG CHEST 2V
2 series · 2 of 2 positions shown · non-contrast
Comparison: None.

CLINICAL DATA: Shortness of breath, cough.

EXAM:
CHEST - 2 VIEW

[chest pa]
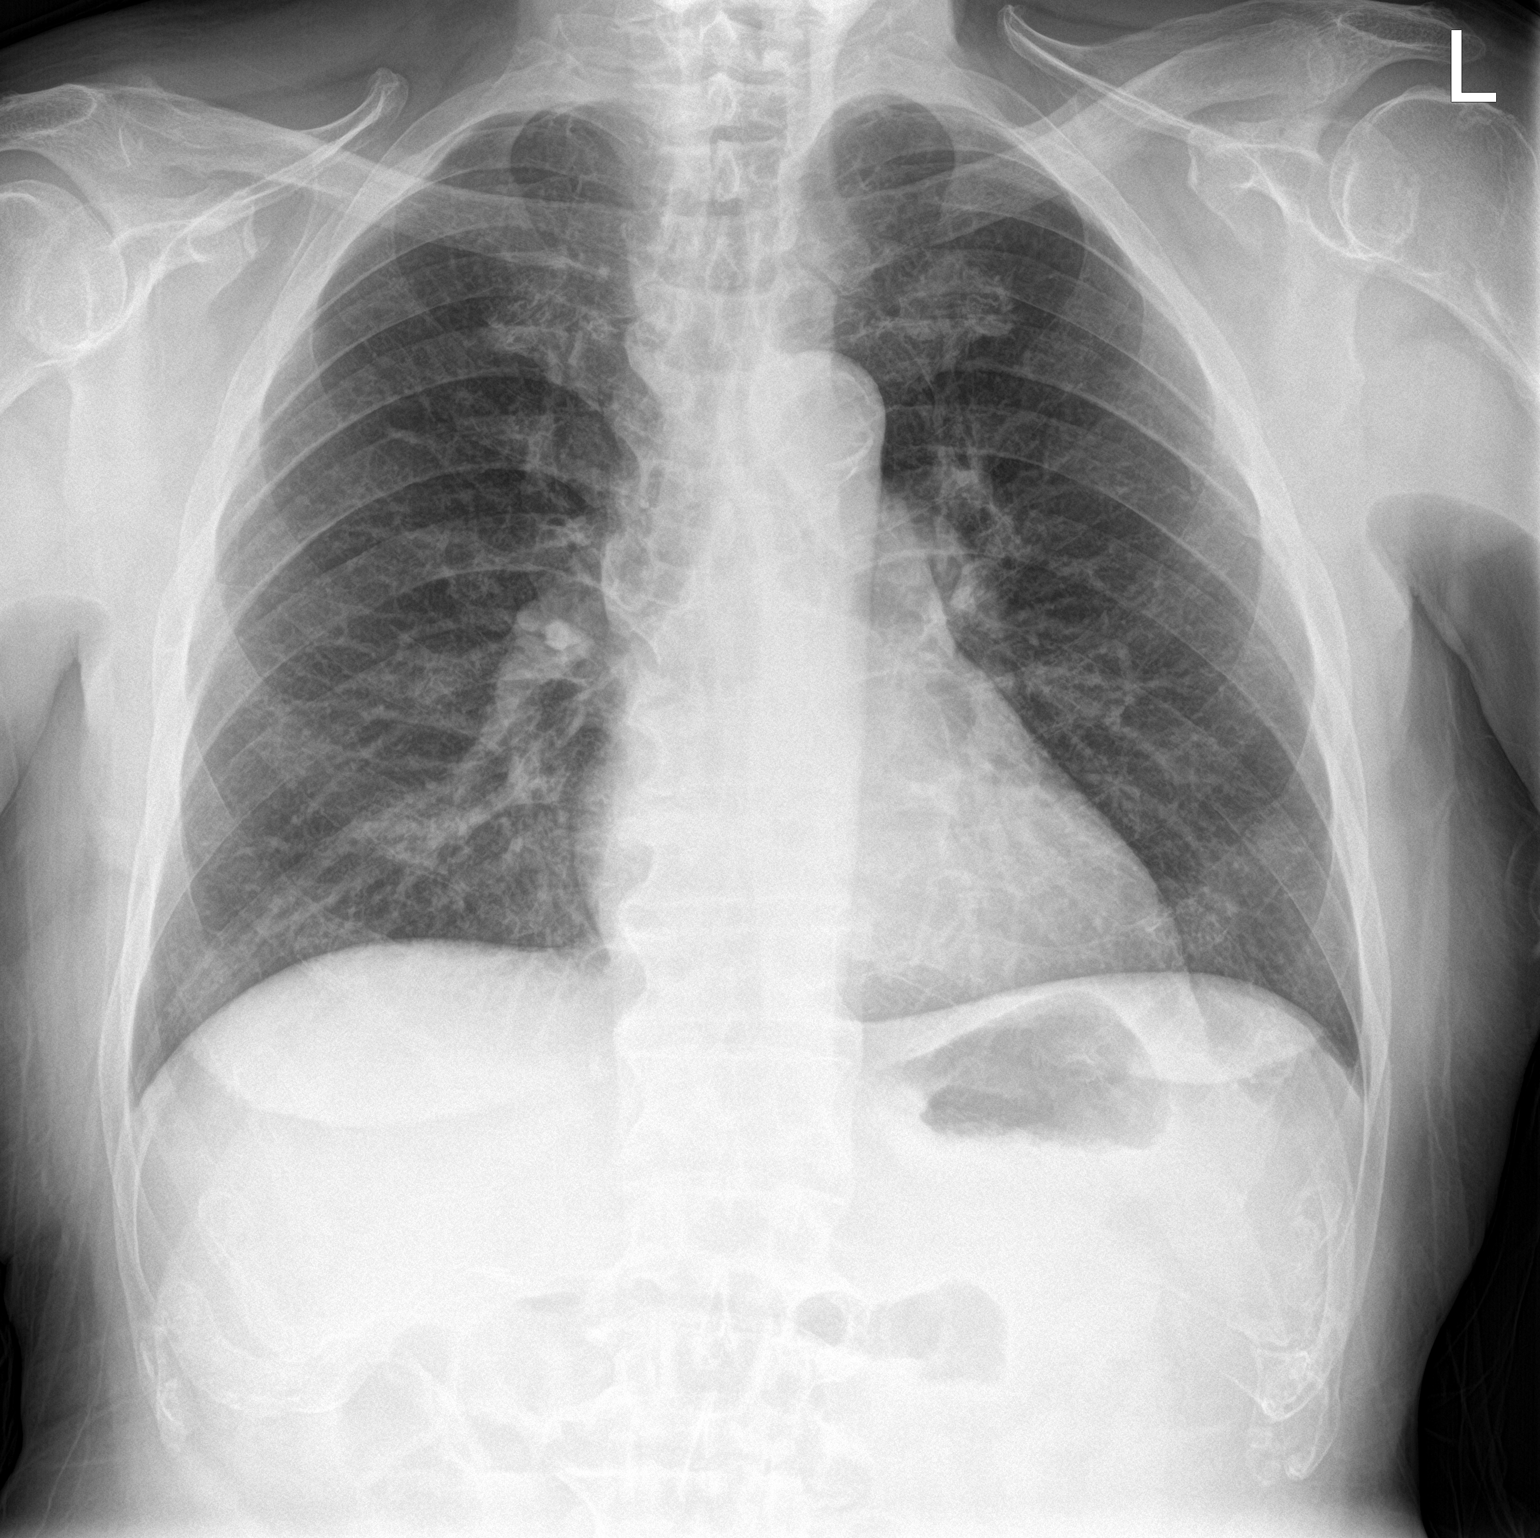

[chest lat]
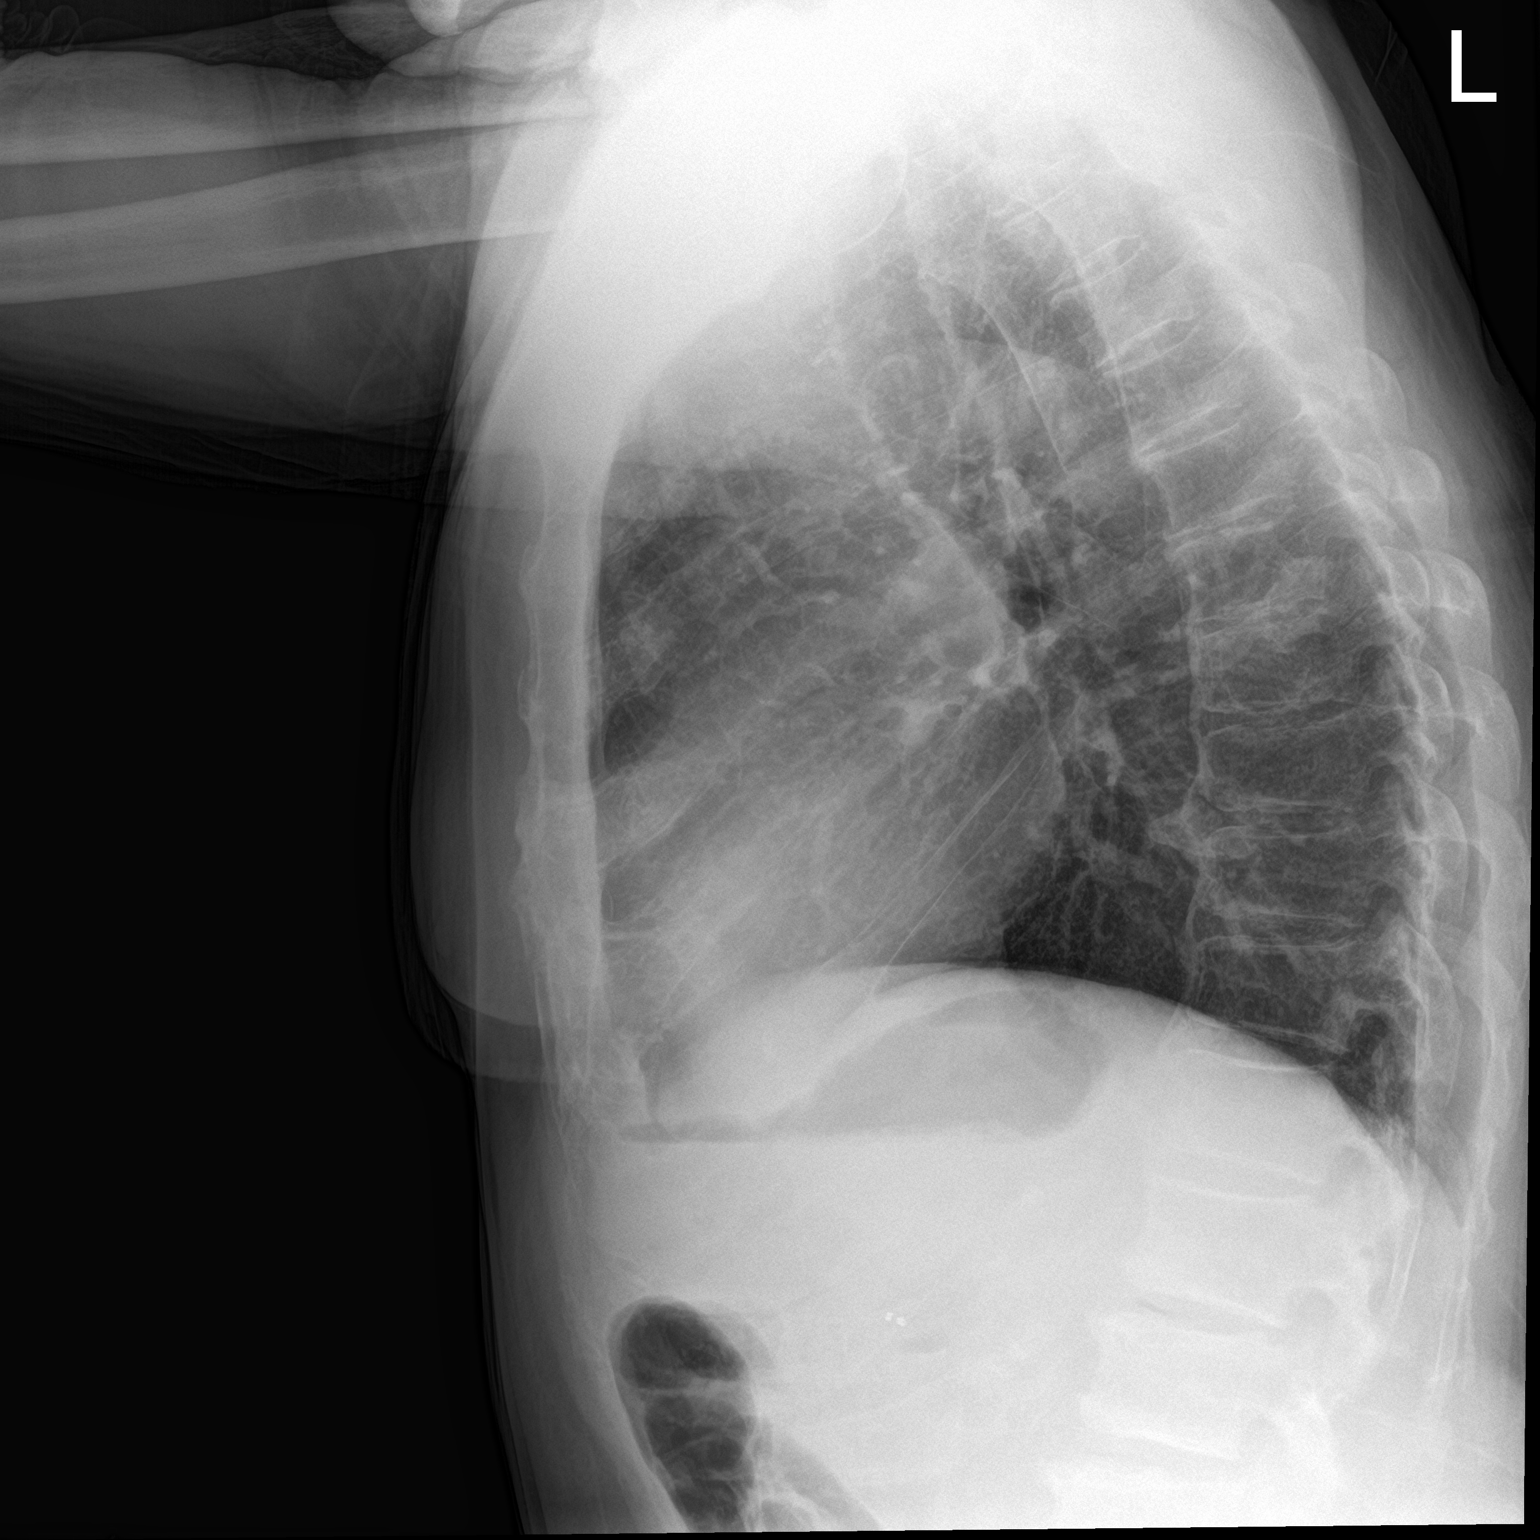

[2 of 2 positions shown; findings below may reference images not displayed]

FINDINGS: The heart size and mediastinal contours are within normal limits.
Both lungs are clear. The visualized skeletal structures are
unremarkable.
IMPRESSION: No active cardiopulmonary disease.

## 2023-06-25 ENCOUNTER — Encounter: Admitting: Physical Therapy

## 2023-06-30 ENCOUNTER — Ambulatory Visit

## 2023-06-30 DIAGNOSIS — R29898 Other symptoms and signs involving the musculoskeletal system: Secondary | ICD-10-CM | POA: Diagnosis not present

## 2023-06-30 DIAGNOSIS — M6281 Muscle weakness (generalized): Secondary | ICD-10-CM

## 2023-06-30 NOTE — Therapy (Signed)
 OUTPATIENT PHYSICAL THERAPY CERVICAL TREATMENT   Patient Name: Damon Acosta MRN: [id number] DOB:1940-09-03, 83 y.o., male Today's Date: 06/30/2023  END OF SESSION:  PT End of Session - 06/30/23 1104     Visit Number 7    Number of Visits 16    Date for PT Re-Evaluation 07/29/23    Authorization Type Medicare    Progress Note Due on Visit 10    PT Start Time 1105    PT Stop Time 1145    PT Time Calculation (min) 40 min    Activity Tolerance Patient tolerated treatment well    Behavior During Therapy [facility] for tasks assessed/performed            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   CKD (chronic kidney disease)    Complication of anesthesia    patient had dificulty waking up   Coronary artery disease    Diabetes mellitus without complication ([name])    Gout    Hyperlipidemia    Hypertension    Myocardial infarction ([name])    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   colonic pseudo-obstruction, s/p colectomy with ileostomy 12/2013   Overactive bladder    Sleep apnea    Stroke [facility])    Past Surgical History:  Procedure Laterality Date   CHOLECYSTECTOMY     COLOSTOMY     POSTERIOR CERVICAL LAMINECTOMY N/A 02/09/2023   Procedure: Laminectomy  Cervical two - Cervical seven;  Surgeon: Agustina Aldrich, MD;  Location: [facility] OR;  Service: Neurosurgery;  Laterality: N/A;   PROSTATE SURGERY     Patient Active Problem List   Diagnosis Date Noted   Cervical spondylosis with myelopathy and radiculopathy 02/09/2023   Atopic dermatitis 10/28/2022   Throat clearing 09/16/2022   Dyspnea 07/07/2022   Urinary retention 06/04/2022   Cognitive change 06/04/2022   Insomnia 03/17/2022   Callus of foot, right heel 12/11/2021   Cervical cord myelomalacia ([name])  10/30/2021   AV block, 1st degree 08/14/2021   Sinus bradycardia on ECG 08/14/2021   OSA (obstructive sleep apnea) 08/01/2021   History of MI (myocardial infarction) 10/21/2019   Diversion colitis 10/07/2019   Gastroesophageal reflux disease without esophagitis 10/07/2019   PAF (paroxysmal atrial fibrillation) ([name]) 09/16/2019   Atrial flutter ([name]) 09/15/2019   NSTEMI (non-ST elevated myocardial infarction) ([name]) 09/15/2019   Recurrent major depressive disorder, in partial remission ([name]) 09/15/2019   Coronary artery disease of native artery of native heart with stable angina pectoris ([name]) 09/15/2019   Severe episode of recurrent major depressive disorder, without psychotic features ([name]) 09/15/2019   Asymptomatic PVCs 07/27/2018   CKD (chronic kidney disease) stage 3, GFR 30-59 ml/min ([name]) 04/27/2018   Mild cognitive impairment 04/20/2018   Microcytic anemia 04/20/2018   Primary osteoarthritis of both knees 02/17/2018   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   Type 2 diabetes mellitus with chronic kidney disease, without long-term current use of insulin  ([name]) 01/06/2018   Dyslipidemia associated with type 2 diabetes mellitus ([name]) 01/06/2018   OAB (overactive bladder) 01/06/2018   History of prostate cancer 01/06/2018   Lumbar spinal stenosis 12/15/2017   Colostomy status ([name]) 12/15/2017   Hypertension associated with diabetes ([name]) 12/15/2017   Gout 12/15/2017   History of CVA (cerebrovascular accident) 12/15/2017  Ileostomy in place [facility]) 12/15/2017    PCP: Vincenza Greener PROVIDER: Agustina Aldrich  REFERRING DIAG: cervical spinal stenosis with myelopathy  THERAPY DIAG:  Muscle weakness (generalized)  Other symptoms and signs involving the musculoskeletal system  Rationale for Evaluation and Treatment: Rehabilitation  ONSET DATE: 02/09/24  SUBJECTIVE:                                                                                                                                                                                                          SUBJECTIVE STATEMENT: Patient reports his pain neck pain is decreasing but he still has a hard time holding his head up. Patient states his R hip has been hurting since Tuesday night.   PERTINENT HISTORY:  Lumbar spinal stenosis CVA CKD NSTEMI  Pt is s/p cervical laminectomy C2-C7 on 02/09/24. Since the surgery he continues with pain in his neck and with Lt UE weakness and bilat UE numbness/tingling. Prior to surgery he used a PFRW for ambulation but he has had a few recent falls and is now using a power w/c for mobility in and out of the house. Pt reports difficulty using Lt UE for functional activities. He reports he drops items often due to numbness in hands. Numbness does not get better or worse, it is just "always there".  Hand dominance: Right  PAIN:  Are you having pain? Yes: NPRS scale: 5/10 currently, 9/10 at worst Pain location: neck, LT UE Pain description: sore, ache Aggravating factors: increased use of Lt UE Relieving factors: meds  PRECAUTIONS: Fall  RED FLAGS: None     WEIGHT BEARING RESTRICTIONS: No  FALLS:  Has patient fallen in last 6 months? Yes. Number of falls 2-3  LIVING ENVIRONMENT: Lives with: lives with their family Lives in: House/apartment Stairs: No Has following equipment at home: Environmental consultant - 2 wheeled and Wheelchair (power)  OCCUPATION: retired  PLOF: Independent with basic ADLs, Independent with household mobility with device, and Requires assistive device for independence  PATIENT GOALS: decrease pain, get Lt UE to work, decrease numbness  NEXT MD VISIT: none on file  OBJECTIVE:  Note: Objective measures were completed at Evaluation unless otherwise noted.  DIAGNOSTIC FINDINGS:  None on file since surgery  PATIENT SURVEYS:  NDI 26/50 - 52%  COGNITION: Overall cognitive status: History of cognitive impairments - at baseline  SENSATION: Light touch:  Impaired   POSTURE: forward head  PALPATION: Increased mm spasticity Rt > Lt cervical paraspinals Increased tenderness to light touch throughout cervical musculature, UT bilat Some decreased symptoms with manual  traction   CERVICAL ROM:   Active ROM A/PROM (deg) eval  Flexion 34  Extension 14 pain  Right lateral flexion 35  Left lateral flexion 25  Right rotation 38  Left rotation 40   (Blank rows = not tested)  UPPER EXTREMITY MMT:  MMT Right eval Left eval  Shoulder flexion 4 3-  Shoulder extension    Shoulder abduction 4 3-  Shoulder adduction    Shoulder extension    Shoulder internal rotation    Shoulder external rotation    Elbow flexion 4+ 3+  Elbow extension 4+ 3  Wrist flexion    Wrist extension    Wrist ulnar deviation    Wrist radial deviation    Wrist pronation    GRIP STRENGTH 50 19   (Blank rows = not tested)  UPPER EXTREMITY ROM:  Active ROM Right eval Left eval  Shoulder flexion WFL 60  Shoulder extension    Shoulder abduction WFL 85 pain  Shoulder adduction    Shoulder extension    Shoulder internal rotation    Shoulder external rotation    Elbow flexion    Elbow extension    Wrist flexion    Wrist extension    Wrist ulnar deviation    Wrist radial deviation    Wrist pronation    Wrist supination     (Blank rows = not tested)  CERVICAL SPECIAL TESTS:  Spurling's test: Positive   [facility] Adult PT Treatment:                                                DATE: 06/30/2023 Therapeutic Exercise: Seated: Passive pec stretch with 1/2 foam roller vertical at back Thoracic extension stretch with deflated coregeous ball + pillowcase support for neck/head 10x5" Standing with back against noodle --> scap squeeze + chest lift Manual Therapy: STM cervical musculature Passive cervical lateral flexion and rotation stretches Passive scalenes/SCM stretches Neuromuscular re-ed: Supine on incline with pillow and towel roll --> gradually  lowering incline: Chest lift + scap retraction Shoulder flexion + 1# dowel  Shoulder horizontal abduction + YTB --> added cervical turns Supine assisted cervical isometrics   [facility] Adult PT Treatment:                                                DATE: 06/23/23 Therapeutic Exercise/Activity: Supine on incline with pillow and towel roll Scap retraction and chest lift Bear hug Alt shoulder flexion (AAROM on Lt) Bent elbow reach up for thoracic extension Seated with back against noodle Backward shoulder rolls Chin tucks Horizontal abd yellow TB Bilat ER yellow TB Cervical retraction with cues Modified dead bug  Standing  Shoulder flexion wall slide Lt x 4 Back against noodle: scap squeeze with chest elevation, cervical retraction with ball cue Manual Therapy: Cervical PROM all directions   [facility] Adult PT Treatment:                                                DATE: 06/18/2023 Therapeutic Exercise: Seated: Bkwd shoulder circles  Chin tucks Passive cervical rotation stretch in  supine Neuromuscular re-ed: Supine on incline with pillow & towel roll (gradually lowering incline): Scap retraction + chest lift Cervical retraction  T arm open + 1#DB Bent elbow reach up + 1#DB --> hands on front of shoulders (thoracic extension) Modified dead bug Standing with noodle at wall: Cervical retraction with hand/ball cue Low shoulder horizontal abduction + 1#DB   [facility] Adult PT Treatment:                                                DATE: 06/16/23 Therapeutic Exercise/Activity/NMR: Supine with head elevated and pillow Shoulder AAROM flexion with dowel 2 x 5 Shoulder AAROM abduction with dowel x 10 Cervical rotation AROM Cervical retraction with towel roll behind neck Scap squeeze focus on chest elevation Seated Thoracic rotation Backward shoulder rolls Seated with noodle: Thoracic extension with breathing cues to improve stretch and mobility Horizontal abd yellow TB Rows red  TB Scap squeeze Manual Therapy: STM bilat UT, levator, cervical paraspinals, suboccipitals   PATIENT EDUCATION:  Education details: Updated HEP Person educated: Patient Education method: Explanation, Demonstration, and Handouts Education comprehension: verbalized understanding and returned demonstration  HOME EXERCISE PROGRAM: Access Code: [email or web address] URL: [email or web address] Date: 06/18/2023 Prepared by: Sims Duck  Exercises - Seated Scapular Retraction  - 1 x daily - 7 x weekly - 2 sets - 10 reps - Seated Shoulder Abduction AAROM with Dowel  - 1 x daily - 7 x weekly - 2 sets - 10 reps - Supine Shoulder Flexion Extension AAROM with Dowel  - 1 x daily - 7 x weekly - 2 sets - 10 reps - Supine Cervical Retraction with Towel  - 1 x daily - 7 x weekly - 3 sets - 10 reps - Supine Cervical Rotation AROM on Pillow  - 1 x daily - 7 x weekly - 3 sets - 10 reps - Seated Diaphragmatic Breathing  - 1 x daily - 7 x weekly - 3 sets - 10 reps - Seated Shoulder Row with Anchored Resistance  - 1 x daily - 7 x weekly - 3 sets - 10 reps - Seated Thoracic Extension with Hands Behind Neck  - 1 x daily - 7 x weekly - 1 sets - 10 reps - Standing Scapular Retraction  - 1 x daily - 7 x weekly - 3 sets - 10 reps - Standing with Back Flat Against Wall  - 1 x daily - 7 x weekly - 3 sets - 10 reps - Supine Elbow Flexion Extension AROM  - 1 x daily - 7 x weekly - 3 sets - 10 reps - Dead Bug  - 1 x daily - 7 x weekly - 3 sets - 10 reps  ASSESSMENT:  CLINICAL IMPRESSION:  Postural exercises continued to progress from supine to standing as tolerated. Patient continues to have difficulty maintaining upright cervical posture due to weakness in standing. Increased weakness in Lt arm during resisted shoulder horizontal abduction noted after 3 repetitions.      OBJECTIVE IMPAIRMENTS: decreased activity tolerance, decreased mobility, decreased ROM, decreased strength, increased muscle spasms,  impaired sensation, impaired UE functional use, postural dysfunction, and pain.      GOALS: Goals reviewed with patient? Yes  SHORT TERM GOALS: Target date: 07/01/2023  Pt will be independent in initial HEP Baseline:  Goal status: MET  2.  Pt will improve Lt UE AROM  to 90 degrees flexion and 100 degrees abduction Baseline: see above 06/30/23: flexion 92 deg, abd 70 deg Goal status: IN PROGRESS  LONG TERM GOALS: Target date: 07/29/2023  Pt will be independent with advanced HEP Baseline:  Goal status: INITIAL  2.  NDI will improve to <= 16/50 to demo improved functional mobility Baseline: 26/50 Goal status: INITIAL  3.  Pt will improve Lt LE strength to 4/5 to improve functional activity tolerance Baseline: see above Goal status: INITIAL  4.  Pt will report 50% reduction in numbness in bilat UEs Baseline:  Goal status: INITIAL     PLAN:  PT FREQUENCY: 2x/week  PT DURATION: 8 weeks  PLANNED INTERVENTIONS: 97164- PT Re-evaluation, 97110-Therapeutic exercises, 97530- Therapeutic activity, [id number]- Neuromuscular re-education, 97535- Self Care, 16109- Manual therapy, [id number]- Aquatic Therapy, [id number]- Electrical stimulation (unattended), Patient/Family education, Taping, Dry Needling, Cryotherapy, and Moist heat  PLAN FOR NEXT SESSION: Update HEP next visit. Postural strength, cervical ROM and strength; gradually lower incline as tolerated; standing exercises as tolerated.   Flint Hummer, PTA 06/30/2023, 11:53 AM

## 2023-07-02 ENCOUNTER — Ambulatory Visit

## 2023-07-02 DIAGNOSIS — R29898 Other symptoms and signs involving the musculoskeletal system: Secondary | ICD-10-CM | POA: Diagnosis not present

## 2023-07-02 DIAGNOSIS — M6281 Muscle weakness (generalized): Secondary | ICD-10-CM | POA: Diagnosis not present

## 2023-07-02 NOTE — Therapy (Signed)
 OUTPATIENT PHYSICAL THERAPY CERVICAL TREATMENT   Patient Name: Damon Acosta MRN: [id number] DOB:[birth year], 83 y.o., male Today's Date: 07/02/2023  END OF SESSION:  PT End of Session - 07/02/23 1402     Visit Number 8    Number of Visits 16    Date for PT Re-Evaluation 07/29/23    Authorization Type Medicare    Progress Note Due on Visit 10    PT Start Time 1402    PT Stop Time 1449    PT Time Calculation (min) 47 min    Activity Tolerance Patient tolerated treatment well    Behavior During Therapy [facility] for tasks assessed/performed            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   CKD (chronic kidney disease)    Complication of anesthesia    patient had dificulty waking up   Coronary artery disease    Diabetes mellitus without complication ([name])    Gout    Hyperlipidemia    Hypertension    Myocardial infarction ([name])    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   colonic pseudo-obstruction, s/p colectomy with ileostomy 12/2013   Overactive bladder    Sleep apnea    Stroke [facility])    Past Surgical History:  Procedure Laterality Date   CHOLECYSTECTOMY     COLOSTOMY     POSTERIOR CERVICAL LAMINECTOMY N/A 02/09/2023   Procedure: Laminectomy  Cervical two - Cervical seven;  Surgeon: Agustina Aldrich, MD;  Location: [facility] OR;  Service: Neurosurgery;  Laterality: N/A;   PROSTATE SURGERY     Patient Active Problem List   Diagnosis Date Noted   Cervical spondylosis with myelopathy and radiculopathy 02/09/2023   Atopic dermatitis 10/28/2022   Throat clearing 09/16/2022   Dyspnea 07/07/2022   Urinary retention 06/04/2022   Cognitive change 06/04/2022   Insomnia 03/17/2022   Callus of foot, right heel 12/11/2021   Cervical cord myelomalacia ([name])  10/30/2021   AV block, 1st degree 08/14/2021   Sinus bradycardia on ECG 08/14/2021   OSA (obstructive sleep apnea) 08/01/2021   History of MI (myocardial infarction) 10/21/2019   Diversion colitis 10/07/2019   Gastroesophageal reflux disease without esophagitis 10/07/2019   PAF (paroxysmal atrial fibrillation) ([name]) 09/16/2019   Atrial flutter ([name]) 09/15/2019   NSTEMI (non-ST elevated myocardial infarction) ([name]) 09/15/2019   Recurrent major depressive disorder, in partial remission ([name]) 09/15/2019   Coronary artery disease of native artery of native heart with stable angina pectoris ([name]) 09/15/2019   Severe episode of recurrent major depressive disorder, without psychotic features ([name]) 09/15/2019   Asymptomatic PVCs 07/27/2018   CKD (chronic kidney disease) stage 3, GFR 30-59 ml/min ([name]) 04/27/2018   Mild cognitive impairment 04/20/2018   Microcytic anemia 04/20/2018   Primary osteoarthritis of both knees 02/17/2018   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   Type 2 diabetes mellitus with chronic kidney disease, without long-term current use of insulin  ([name]) 01/06/2018   Dyslipidemia associated with type 2 diabetes mellitus ([name]) 01/06/2018   OAB (overactive bladder) 01/06/2018   History of prostate cancer 01/06/2018   Lumbar spinal stenosis 12/15/2017   Colostomy status ([name]) 12/15/2017   Hypertension associated with diabetes ([name]) 12/15/2017   Gout 12/15/2017   History of CVA (cerebrovascular accident) 12/15/2017  Ileostomy in place [facility]) 12/15/2017    PCP: Vincenza Greener PROVIDER: Agustina Aldrich  REFERRING DIAG: cervical spinal stenosis with myelopathy  THERAPY DIAG:  Muscle weakness (generalized)  Other symptoms and signs involving the musculoskeletal system  Rationale for Evaluation and Treatment: Rehabilitation  ONSET DATE: 02/09/24  SUBJECTIVE:                                                                                                                                                                                                          SUBJECTIVE STATEMENT: Patient reports his neck is moving better but no change in endurance/strength with keeping it lifted when standing.  PERTINENT HISTORY:  Lumbar spinal stenosis CVA CKD NSTEMI  Pt is s/p cervical laminectomy C2-C7 on 02/09/24. Since the surgery he continues with pain in his neck and with Lt UE weakness and bilat UE numbness/tingling. Prior to surgery he used a PFRW for ambulation but he has had a few recent falls and is now using a power w/c for mobility in and out of the house. Pt reports difficulty using Lt UE for functional activities. He reports he drops items often due to numbness in hands. Numbness does not get better or worse, it is just "always there".  Hand dominance: Right  PAIN:  Are you having pain? Yes: NPRS scale: 5/10 currently, 9/10 at worst Pain location: neck, LT UE Pain description: sore, ache Aggravating factors: increased use of Lt UE Relieving factors: meds  PRECAUTIONS: Fall  RED FLAGS: None     WEIGHT BEARING RESTRICTIONS: No  FALLS:  Has patient fallen in last 6 months? Yes. Number of falls 2-3  LIVING ENVIRONMENT: Lives with: lives with their family Lives in: House/apartment Stairs: No Has following equipment at home: Environmental consultant - 2 wheeled and Wheelchair (power)  OCCUPATION: retired  PLOF: Independent with basic ADLs, Independent with household mobility with device, and Requires assistive device for independence  PATIENT GOALS: decrease pain, get Lt UE to work, decrease numbness  NEXT MD VISIT: none on file  OBJECTIVE:  Note: Objective measures were completed at Evaluation unless otherwise noted.  DIAGNOSTIC FINDINGS:  None on file since surgery  PATIENT SURVEYS:  NDI 26/50 - 52%  COGNITION: Overall cognitive status: History of cognitive impairments - at baseline  SENSATION: Light touch: Impaired   POSTURE: forward  head  PALPATION: Increased mm spasticity Rt > Lt cervical paraspinals Increased tenderness to light touch throughout cervical musculature, UT bilat Some decreased symptoms with manual traction   CERVICAL ROM:   Active ROM A/PROM (deg) eval  Flexion 34  Extension 14 pain  Right lateral flexion 35  Left lateral flexion 25  Right rotation 38  Left rotation 40   (Blank rows = not tested)  UPPER EXTREMITY MMT:  MMT Right eval Left eval  Shoulder flexion 4 3-  Shoulder extension    Shoulder abduction 4 3-  Shoulder adduction    Shoulder extension    Shoulder internal rotation    Shoulder external rotation    Elbow flexion 4+ 3+  Elbow extension 4+ 3  Wrist flexion    Wrist extension    Wrist ulnar deviation    Wrist radial deviation    Wrist pronation    GRIP STRENGTH 50 19   (Blank rows = not tested)  UPPER EXTREMITY ROM:  Active ROM Right eval Left eval  Shoulder flexion WFL 60  Shoulder extension    Shoulder abduction WFL 85 pain  Shoulder adduction    Shoulder extension    Shoulder internal rotation    Shoulder external rotation    Elbow flexion    Elbow extension    Wrist flexion    Wrist extension    Wrist ulnar deviation    Wrist radial deviation    Wrist pronation    Wrist supination     (Blank rows = not tested)  CERVICAL SPECIAL TESTS:  Spurling's test: Positive   [facility] Adult PT Treatment:                                                DATE: 07/02/2023 Therapeutic Exercise: Seated: Bent arm shoulder abd --> tactile cue for scap stability AAROM cervical rotation stretch Shoulder ER + YTB Shoulder horizontal abd + YTB UE D2 --> Lt no resistance, Rt YTB Neuromuscular re-ed: Supine on incline with pillow and towel roll --> gradually lowering incline: Cervical retraction Chest lift + scap retraction Seated: Scap squeeze + chest left AAROM shoulder flexion with dowel --> added 1# Shoulder isometrics --> flexion, abd, ER, ext Cervical  isometrics --> flexion, extension, side bending    [facility] Adult PT Treatment:                                                DATE: 06/30/2023 Therapeutic Exercise: Seated: Passive pec stretch with 1/2 foam roller vertical at back Thoracic extension stretch with deflated coregeous ball + pillowcase support for neck/head 10x5" Standing with back against noodle --> scap squeeze + chest lift Manual Therapy: STM cervical musculature Passive cervical lateral flexion and rotation stretches Passive scalenes/SCM stretches Neuromuscular re-ed: Supine on incline with pillow and towel roll --> gradually lowering incline: Chest lift + scap retraction Shoulder flexion + 1# dowel  Shoulder horizontal abduction + YTB --> added cervical turns Supine assisted cervical isometrics   [facility] Adult PT Treatment:                                                DATE: 06/23/23 Therapeutic Exercise/Activity: Supine on incline with pillow and towel roll Scap retraction and chest lift Bear hug Alt shoulder flexion (AAROM on Lt) Bent elbow reach up  for thoracic extension Seated with back against noodle Backward shoulder rolls Chin tucks Horizontal abd yellow TB Bilat ER yellow TB Cervical retraction with cues Modified dead bug  Standing  Shoulder flexion wall slide Lt x 4 Back against noodle: scap squeeze with chest elevation, cervical retraction with ball cue Manual Therapy: Cervical PROM all directions    PATIENT EDUCATION:  Education details: Updated HEP Person educated: Patient Education method: Explanation, Demonstration, and Handouts Education comprehension: verbalized understanding and returned demonstration  HOME EXERCISE PROGRAM: Access Code: [email or web address] URL: [email or web address] Date: 07/02/2023 Prepared by: Sims Duck  Exercises - Seated Scapular Retraction  - 1 x daily - 7 x weekly - 2 sets - 10 reps - Supine Cervical Retraction with Towel  - 1 x daily - 7 x weekly -  3 sets - 10 reps - Supine Cervical Rotation AROM on Pillow  - 1 x daily - 7 x weekly - 3 sets - 10 reps - Seated Shoulder Row with Anchored Resistance  - 1 x daily - 7 x weekly - 3 sets - 10 reps - Dead Bug  - 1 x daily - 7 x weekly - 3 sets - 10 reps - Shoulder Flexion Wall Slide with Towel  - 1 x daily - 7 x weekly - 3 sets - 10 reps - Standing Isometric Cervical Sidebending with Manual Resistance  - 2 x daily - 7 x weekly - 2 sets - 10 reps - 5 sec hold - Standing Isometric Cervical Flexion with Manual Resistance  - 2 x daily - 7 x weekly - 2 sets - 10 reps - 5 sec hold - Standing Isometric Cervical Extension with Manual Resistance  - 2 x daily - 7 x weekly - 2 sets - 10 reps - 5 sec hold - Standing Isometric Shoulder Abduction with Doorway  - 2 x daily - 7 x weekly - 2 sets - 10 reps - 5 sec hold - Standing Isometric Shoulder Abduction with Doorway - Arm Bent  - 2 x daily - 7 x weekly - 2 sets - 10 reps - 5 sec hold - Seated Shoulder Horizontal Abduction with Resistance  - 1 x daily - 7 x weekly - 3 sets - 10 reps - Seated Single Arm Shoulder External Rotation with Self-Anchored Resistance  - 1 x daily - 7 x weekly - 3 sets - 10 reps  ASSESSMENT:  CLINICAL IMPRESSION:  Shoulder and cervical isometrics incorporated to address strength deficits. Gentle overpressure provided during cervical rotation with good response. Increased nausea in supine with cervical strengthening exercises; position modified to sitting for remainder of session with no exacerbation of symptoms.    OBJECTIVE IMPAIRMENTS: decreased activity tolerance, decreased mobility, decreased ROM, decreased strength, increased muscle spasms, impaired sensation, impaired UE functional use, postural dysfunction, and pain.      GOALS: Goals reviewed with patient? Yes  SHORT TERM GOALS: Target date: 07/01/2023  Pt will be independent in initial HEP Baseline:  Goal status: MET  2.  Pt will improve Lt UE AROM to 90 degrees  flexion and 100 degrees abduction Baseline: see above 06/30/23: flexion 108 deg, abd 76 deg Goal status: IN PROGRESS   LONG TERM GOALS: Target date: 07/29/2023  Pt will be independent with advanced HEP Baseline:  Goal status: INITIAL  2.  NDI will improve to <= 16/50 to demo improved functional mobility Baseline: 26/50 Goal status: INITIAL  3.  Pt will improve Lt LE strength to 4/5 to improve functional activity tolerance Baseline:  see above Goal status: INITIAL  4.  Pt will report 50% reduction in numbness in bilat UEs Baseline:  Goal status: INITIAL     PLAN:  PT FREQUENCY: 2x/week  PT DURATION: 8 weeks  PLANNED INTERVENTIONS: [id number]- PT Re-evaluation, 97110-Therapeutic exercises, 97530- Therapeutic activity, [id number]- Neuromuscular re-education, 97535- Self Care, [id number]- Manual therapy, [id number]- Aquatic Therapy, [id number]- Electrical stimulation (unattended), Patient/Family education, Taping, Dry Needling, Cryotherapy, and Moist heat  PLAN FOR NEXT SESSION: Postural strength, cervical ROM and strength; gradually lower incline as tolerated; standing exercises as tolerated.   Flint Hummer, PTA 07/02/2023, 4:27 PM

## 2023-07-07 ENCOUNTER — Ambulatory Visit

## 2023-07-07 DIAGNOSIS — R29898 Other symptoms and signs involving the musculoskeletal system: Secondary | ICD-10-CM | POA: Diagnosis not present

## 2023-07-07 DIAGNOSIS — M6281 Muscle weakness (generalized): Secondary | ICD-10-CM | POA: Diagnosis not present

## 2023-07-07 NOTE — Therapy (Signed)
 OUTPATIENT PHYSICAL THERAPY CERVICAL TREATMENT   Patient Name: Damon Acosta MRN: [id number] DOB:[birth year], 83 y.o., male Today's Date: 07/07/2023  END OF SESSION:  PT End of Session - 07/07/23 1104     Visit Number 9    Number of Visits 16    Date for PT Re-Evaluation 07/29/23    Authorization Type Medicare    Authorization - Visit Number 9    Authorization - Number of Visits 16    Progress Note Due on Visit 10    PT Start Time 1105    PT Stop Time 1147    PT Time Calculation (min) 42 min    Activity Tolerance Patient tolerated treatment well    Behavior During Therapy [facility] for tasks assessed/performed            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   CKD (chronic kidney disease)    Complication of anesthesia    patient had dificulty waking up   Coronary artery disease    Diabetes mellitus without complication ([name])    Gout    Hyperlipidemia    Hypertension    Myocardial infarction ([name])    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   colonic pseudo-obstruction, s/p colectomy with ileostomy 12/2013   Overactive bladder    Sleep apnea    Stroke [facility])    Past Surgical History:  Procedure Laterality Date   CHOLECYSTECTOMY     COLOSTOMY     POSTERIOR CERVICAL LAMINECTOMY N/A 02/09/2023   Procedure: Laminectomy  Cervical two - Cervical seven;  Surgeon: Agustina Aldrich, MD;  Location: [facility] OR;  Service: Neurosurgery;  Laterality: N/A;   PROSTATE SURGERY     Patient Active Problem List   Diagnosis Date Noted   Cervical spondylosis with myelopathy and radiculopathy 02/09/2023   Atopic dermatitis 10/28/2022   Throat clearing 09/16/2022   Dyspnea 07/07/2022   Urinary retention 06/04/2022   Cognitive change 06/04/2022   Insomnia 03/17/2022    Callus of foot, right heel 12/11/2021   Cervical cord myelomalacia ([name]) 10/30/2021   AV block, 1st degree 08/14/2021   Sinus bradycardia on ECG 08/14/2021   OSA (obstructive sleep apnea) 08/01/2021   History of MI (myocardial infarction) 10/21/2019   Diversion colitis 10/07/2019   Gastroesophageal reflux disease without esophagitis 10/07/2019   PAF (paroxysmal atrial fibrillation) ([name]) 09/16/2019   Atrial flutter ([name]) 09/15/2019   NSTEMI (non-ST elevated myocardial infarction) ([name]) 09/15/2019   Recurrent major depressive disorder, in partial remission ([name]) 09/15/2019   Coronary artery disease of native artery of native heart with stable angina pectoris ([name]) 09/15/2019   Severe episode of recurrent major depressive disorder, without psychotic features ([name]) 09/15/2019   Asymptomatic PVCs 07/27/2018   CKD (chronic kidney disease) stage 3, GFR 30-59 ml/min ([name]) 04/27/2018   Mild cognitive impairment 04/20/2018   Microcytic anemia 04/20/2018   Primary osteoarthritis of both knees 02/17/2018   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   Type 2 diabetes mellitus with chronic kidney disease, without long-term current use of insulin  ([name]) 01/06/2018   Dyslipidemia associated with type 2 diabetes mellitus ([name]) 01/06/2018   OAB (overactive bladder) 01/06/2018   History of prostate cancer 01/06/2018   Lumbar spinal stenosis 12/15/2017   Colostomy status ([name]) 12/15/2017   Hypertension  associated with diabetes ([name]) 12/15/2017   Gout 12/15/2017   History of CVA (cerebrovascular accident) 12/15/2017   Ileostomy in place [facility]) 12/15/2017    PCP: Vincenza Greener PROVIDER: Agustina Aldrich  REFERRING DIAG: cervical spinal stenosis with myelopathy  THERAPY DIAG:  Muscle weakness (generalized)  Rationale for Evaluation and Treatment: Rehabilitation  ONSET DATE: 02/09/24  SUBJECTIVE:                                                                                                                                                                                                          SUBJECTIVE STATEMENT: Patient reports he is "hurting today from the weather".  PERTINENT HISTORY:  Lumbar spinal stenosis CVA CKD NSTEMI  Pt is s/p cervical laminectomy C2-C7 on 02/09/24. Since the surgery he continues with pain in his neck and with Lt UE weakness and bilat UE numbness/tingling. Prior to surgery he used a PFRW for ambulation but he has had a few recent falls and is now using a power w/c for mobility in and out of the house. Pt reports difficulty using Lt UE for functional activities. He reports he drops items often due to numbness in hands. Numbness does not get better or worse, it is just "always there".  Hand dominance: Right  PAIN:  Are you having pain? Yes: NPRS scale: 5/10 currently, 9/10 at worst Pain location: neck, LT UE Pain description: sore, ache Aggravating factors: increased use of Lt UE Relieving factors: meds  PRECAUTIONS: Fall  RED FLAGS: None     WEIGHT BEARING RESTRICTIONS: No  FALLS:  Has patient fallen in last 6 months? Yes. Number of falls 2-3  LIVING ENVIRONMENT: Lives with: lives with their family Lives in: House/apartment Stairs: No Has following equipment at home: Environmental consultant - 2 wheeled and Wheelchair (power)  OCCUPATION: retired  PLOF: Independent with basic ADLs, Independent with household mobility with device, and Requires assistive device for independence  PATIENT GOALS: decrease pain, get Lt UE to work, decrease numbness  NEXT MD VISIT: none on file  OBJECTIVE:  Note: Objective measures were completed at Evaluation unless otherwise noted.  DIAGNOSTIC FINDINGS:  None on file since surgery  PATIENT SURVEYS:  NDI 26/50 - 52%  COGNITION: Overall cognitive status: History of cognitive impairments - at baseline  SENSATION: Light touch: Impaired   POSTURE: forward head  PALPATION: Increased mm spasticity Rt > Lt cervical  paraspinals Increased tenderness to light touch throughout cervical musculature, UT bilat Some decreased symptoms with manual traction   CERVICAL ROM:   Active ROM A/PROM (deg) eval  Flexion 34  Extension 14 pain  Right lateral flexion 35  Left lateral flexion 25  Right rotation 38  Left rotation 40   (Blank rows = not tested)  UPPER EXTREMITY MMT:  MMT Right eval Left eval  Shoulder flexion 4 3-  Shoulder extension    Shoulder abduction 4 3-  Shoulder adduction    Shoulder extension    Shoulder internal rotation    Shoulder external rotation    Elbow flexion 4+ 3+  Elbow extension 4+ 3  Wrist flexion    Wrist extension    Wrist ulnar deviation    Wrist radial deviation    Wrist pronation    GRIP STRENGTH 50 19   (Blank rows = not tested)  UPPER EXTREMITY ROM:  Active ROM Right eval Left eval  Shoulder flexion WFL 60  Shoulder extension    Shoulder abduction WFL 85 pain  Shoulder adduction    Shoulder extension    Shoulder internal rotation    Shoulder external rotation    Elbow flexion    Elbow extension    Wrist flexion    Wrist extension    Wrist ulnar deviation    Wrist radial deviation    Wrist pronation    Wrist supination     (Blank rows = not tested)  CERVICAL SPECIAL TESTS:  Spurling's test: Positive   [facility] Adult PT Treatment:                                                DATE: 07/07/2023 Therapeutic Exercise: Seated: Scap squeezes + chest lift AAROM cervical rotation stretch Supine shoulder flexion + 1#dowel   Manual Therapy: STM cervical musculature Passive cervical lateral flexion and rotation stretches Passive scalenes/SCM stretches Neuromuscular re-ed: Standing with noodle: Bent arm shoulder abduction Shoulder flexion AROM Supine cervical isometrics Seated hip hinge fwd <--> resisted trunk extension with black super band Seated dead lift + 5#KB --> cueing posture    [facility] Adult PT Treatment:                                                 DATE: 07/02/2023 Therapeutic Exercise: Seated: Bent arm shoulder abd --> tactile cue for scap stability AAROM cervical rotation stretch Shoulder ER + YTB Shoulder horizontal abd + YTB UE D2 --> Lt no resistance, Rt YTB Neuromuscular re-ed: Supine on incline with pillow and towel roll --> gradually lowering incline: Cervical retraction Chest lift + scap retraction Seated: Scap squeeze + chest left AAROM shoulder flexion with dowel --> added 1# Shoulder isometrics --> flexion, abd, ER, ext Cervical isometrics --> flexion, extension, side bending    [facility] Adult PT Treatment:                                                DATE: 06/30/2023 Therapeutic Exercise: Seated: Passive pec stretch with 1/2 foam roller vertical at back Thoracic extension stretch with deflated coregeous ball + pillowcase support for neck/head 10x5" Standing with back against noodle --> scap squeeze + chest lift Manual Therapy: STM cervical musculature Passive cervical lateral flexion and  rotation stretches Passive scalenes/SCM stretches Neuromuscular re-ed: Supine on incline with pillow and towel roll --> gradually lowering incline: Chest lift + scap retraction Shoulder flexion + 1# dowel  Shoulder horizontal abduction + YTB --> added cervical turns Supine assisted cervical isometrics   [facility] Adult PT Treatment:                                                DATE: 06/23/23 Therapeutic Exercise/Activity: Supine on incline with pillow and towel roll Scap retraction and chest lift Bear hug Alt shoulder flexion (AAROM on Lt) Bent elbow reach up for thoracic extension Seated with back against noodle Backward shoulder rolls Chin tucks Horizontal abd yellow TB Bilat ER yellow TB Cervical retraction with cues Modified dead bug  Standing  Shoulder flexion wall slide Lt x 4 Back against noodle: scap squeeze with chest elevation, cervical retraction with ball cue Manual Therapy: Cervical  PROM all directions    PATIENT EDUCATION:  Education details: Updated HEP Person educated: Patient Education method: Explanation, Demonstration, and Handouts Education comprehension: verbalized understanding and returned demonstration  HOME EXERCISE PROGRAM: Access Code: [email or web address] URL: [email or web address] Date: 07/02/2023 Prepared by: Sims Duck  Exercises - Seated Scapular Retraction  - 1 x daily - 7 x weekly - 2 sets - 10 reps - Supine Cervical Retraction with Towel  - 1 x daily - 7 x weekly - 3 sets - 10 reps - Supine Cervical Rotation AROM on Pillow  - 1 x daily - 7 x weekly - 3 sets - 10 reps - Seated Shoulder Row with Anchored Resistance  - 1 x daily - 7 x weekly - 3 sets - 10 reps - Dead Bug  - 1 x daily - 7 x weekly - 3 sets - 10 reps - Shoulder Flexion Wall Slide with Towel  - 1 x daily - 7 x weekly - 3 sets - 10 reps - Standing Isometric Cervical Sidebending with Manual Resistance  - 2 x daily - 7 x weekly - 2 sets - 10 reps - 5 sec hold - Standing Isometric Cervical Flexion with Manual Resistance  - 2 x daily - 7 x weekly - 2 sets - 10 reps - 5 sec hold - Standing Isometric Cervical Extension with Manual Resistance  - 2 x daily - 7 x weekly - 2 sets - 10 reps - 5 sec hold - Standing Isometric Shoulder Abduction with Doorway  - 2 x daily - 7 x weekly - 2 sets - 10 reps - 5 sec hold - Standing Isometric Shoulder Abduction with Doorway - Arm Bent  - 2 x daily - 7 x weekly - 2 sets - 10 reps - 5 sec hold - Seated Shoulder Horizontal Abduction with Resistance  - 1 x daily - 7 x weekly - 3 sets - 10 reps - Seated Single Arm Shoulder External Rotation with Self-Anchored Resistance  - 1 x daily - 7 x weekly - 3 sets - 10 reps  ASSESSMENT:  CLINICAL IMPRESSION:  No change in numbness in bilateral UE. Cervical mobility exercises and postural strengthening continued to tolerance. Functional seated dead lifts incorporated with focus on hip hinge and postural  awareness; frequent verbal cues provided for cervical posture.     OBJECTIVE IMPAIRMENTS: decreased activity tolerance, decreased mobility, decreased ROM, decreased strength, increased muscle spasms, impaired sensation, impaired UE functional use,  postural dysfunction, and pain.      GOALS: Goals reviewed with patient? Yes  SHORT TERM GOALS: Target date: 07/01/2023  Pt will be independent in initial HEP Baseline:  Goal status: MET  2.  Pt will improve Lt UE AROM to 90 degrees flexion and 100 degrees abduction Baseline: see above 06/30/23: flexion 108 deg, abd 76 deg Goal status: IN PROGRESS   LONG TERM GOALS: Target date: 07/29/2023  Pt will be independent with advanced HEP Baseline:  Goal status: INITIAL  2.  NDI will improve to <= 16/50 to demo improved functional mobility Baseline: 26/50 Goal status: INITIAL  3.  Pt will improve Lt LE strength to 4/5 to improve functional activity tolerance Baseline: see above Goal status: INITIAL  4.  Pt will report 50% reduction in numbness in bilat UEs Baseline:  07/07/23: no change Goal status: INITIAL     PLAN:  PT FREQUENCY: 2x/week  PT DURATION: 8 weeks  PLANNED INTERVENTIONS: [id number]- PT Re-evaluation, 97110-Therapeutic exercises, 97530- Therapeutic activity, 97112- Neuromuscular re-education, 97535- Self Care, [id number]- Manual therapy, [id number]- Aquatic Therapy, [id number]- Electrical stimulation (unattended), Patient/Family education, Taping, Dry Needling, Cryotherapy, and Moist heat  PLAN FOR NEXT SESSION: Postural strength, cervical ROM and strength; gradually lower incline as tolerated; standing exercises as tolerated.   Flint Hummer, PTA 07/07/2023, 12:11 PM

## 2023-07-14 ENCOUNTER — Ambulatory Visit: Attending: Neurosurgery

## 2023-07-14 DIAGNOSIS — M6281 Muscle weakness (generalized): Secondary | ICD-10-CM

## 2023-07-14 DIAGNOSIS — R29898 Other symptoms and signs involving the musculoskeletal system: Secondary | ICD-10-CM

## 2023-07-14 NOTE — Therapy (Signed)
 OUTPATIENT PHYSICAL THERAPY CERVICAL TREATMENT Progress Note  Reporting Period 06/03/2023 to 07/14/2023  See note below for Objective Data and Assessment of Progress/Goals.      Patient Name: Damon Acosta MRN: [id number] DOB:03/28/1940, 83 y.o., male Today's Date: 07/14/2023  END OF SESSION:  PT End of Session - 07/14/23 1107     Visit Number 10    Number of Visits 16    Date for PT Re-Evaluation 07/29/23    Authorization Type Medicare    Authorization - Visit Number 10    Authorization - Number of Visits 16    Progress Note Due on Visit 10    PT Start Time 1107    PT Stop Time 1150    PT Time Calculation (min) 43 min    Activity Tolerance Patient tolerated treatment well    Behavior During Therapy [facility] for tasks assessed/performed            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   CKD (chronic kidney disease)    Complication of anesthesia    patient had dificulty waking up   Coronary artery disease    Diabetes mellitus without complication ([name])    Gout    Hyperlipidemia    Hypertension    Myocardial infarction ([name])    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   colonic pseudo-obstruction, s/p colectomy with ileostomy 12/2013   Overactive bladder    Sleep apnea    Stroke [facility])    Past Surgical History:  Procedure Laterality Date   CHOLECYSTECTOMY     COLOSTOMY     POSTERIOR CERVICAL LAMINECTOMY N/A 02/09/2023   Procedure: Laminectomy  Cervical two - Cervical seven;  Surgeon: Agustina Aldrich, MD;  Location: [facility] OR;  Service: Neurosurgery;  Laterality: N/A;   PROSTATE SURGERY     Patient Active Problem List   Diagnosis Date Noted   Cervical spondylosis with myelopathy and radiculopathy 02/09/2023   Atopic dermatitis 10/28/2022    Throat clearing 09/16/2022   Dyspnea 07/07/2022   Urinary retention 06/04/2022   Cognitive change 06/04/2022   Insomnia 03/17/2022   Callus of foot, right heel 12/11/2021   Cervical cord myelomalacia ([name]) 10/30/2021   AV block, 1st degree 08/14/2021   Sinus bradycardia on ECG 08/14/2021   OSA (obstructive sleep apnea) 08/01/2021   History of MI (myocardial infarction) 10/21/2019   Diversion colitis 10/07/2019   Gastroesophageal reflux disease without esophagitis 10/07/2019   PAF (paroxysmal atrial fibrillation) ([name]) 09/16/2019   Atrial flutter ([name]) 09/15/2019   NSTEMI (non-ST elevated myocardial infarction) ([name]) 09/15/2019   Recurrent major depressive disorder, in partial remission ([name]) 09/15/2019   Coronary artery disease of native artery of native heart with stable angina pectoris ([name]) 09/15/2019   Severe episode of recurrent major depressive disorder, without psychotic features ([name]) 09/15/2019   Asymptomatic PVCs 07/27/2018   CKD (chronic kidney disease) stage 3, GFR 30-59 ml/min ([name]) 04/27/2018   Mild cognitive impairment 04/20/2018   Microcytic anemia 04/20/2018   Primary osteoarthritis of both knees 02/17/2018   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   Type 2 diabetes mellitus with chronic kidney disease, without long-term current use of insulin  ([name]) 01/06/2018   Dyslipidemia associated with type 2 diabetes mellitus ([name]) 01/06/2018   OAB (overactive bladder) 01/06/2018  History of prostate cancer 01/06/2018   Lumbar spinal stenosis 12/15/2017   Colostomy status ([name]) 12/15/2017   Hypertension associated with diabetes ([name]) 12/15/2017   Gout 12/15/2017   History of CVA (cerebrovascular accident) 12/15/2017   Ileostomy in place [facility]) 12/15/2017    PCP: Vincenza Greener PROVIDER: Agustina Aldrich  REFERRING DIAG: cervical spinal stenosis with myelopathy  THERAPY DIAG:  Muscle weakness (generalized)  Other symptoms and signs involving the musculoskeletal  system  Rationale for Evaluation and Treatment: Rehabilitation  ONSET DATE: 02/09/24  SUBJECTIVE:                                                                                                                                                                                                         SUBJECTIVE STATEMENT: Patient reports his neck feels better with less pain but continues to be unable to hold hid head up when standing.   PERTINENT HISTORY:  Lumbar spinal stenosis CVA CKD NSTEMI  Pt is s/p cervical laminectomy C2-C7 on 02/09/24. Since the surgery he continues with pain in his neck and with Lt UE weakness and bilat UE numbness/tingling. Prior to surgery he used a PFRW for ambulation but he has had a few recent falls and is now using a power w/c for mobility in and out of the house. Pt reports difficulty using Lt UE for functional activities. He reports he drops items often due to numbness in hands. Numbness does not get better or worse, it is just "always there".  Hand dominance: Right  PAIN:  Are you having pain? Yes: NPRS scale: 6/10 currently, 8/10 at worst Pain location: neck, LT UE Pain description: sore, ache Aggravating factors: increased use of Lt UE Relieving factors: meds  PRECAUTIONS: Fall  RED FLAGS: None     WEIGHT BEARING RESTRICTIONS: No  FALLS:  Has patient fallen in last 6 months? Yes. Number of falls 2-3  LIVING ENVIRONMENT: Lives with: lives with their family Lives in: House/apartment Stairs: No Has following equipment at home: Environmental consultant - 2 wheeled and Wheelchair (power)  OCCUPATION: retired  PLOF: Independent with basic ADLs, Independent with household mobility with device, and Requires assistive device for independence  PATIENT GOALS: decrease pain, get Lt UE to work, decrease numbness  NEXT MD VISIT: none on file  OBJECTIVE:  Note: Objective measures were completed at Evaluation unless otherwise noted.  DIAGNOSTIC FINDINGS:  None  on file since surgery  PATIENT SURVEYS:  NDI 26/50 - 52%  COGNITION: Overall cognitive status: History of cognitive impairments - at baseline  SENSATION: Light touch: Impaired  POSTURE: forward head  PALPATION: Increased mm spasticity Rt > Lt cervical paraspinals Increased tenderness to light touch throughout cervical musculature, UT bilat Some decreased symptoms with manual traction   CERVICAL ROM:   Active ROM A/PROM (deg) eval  Flexion 34  Extension 14 pain  Right lateral flexion 35  Left lateral flexion 25  Right rotation 38  Left rotation 40   (Blank rows = not tested)  UPPER EXTREMITY MMT:  MMT Right eval Left eval Right 07/14/23 Left 07/14/23  Shoulder flexion 4 3- 4 4-  Shoulder extension      Shoulder abduction 4 3- 4 4-  Shoulder adduction      Shoulder extension      Shoulder internal rotation      Shoulder external rotation      Elbow flexion 4+ 3+ 4+ 4-  Elbow extension 4+ 3 4+ 4+  Wrist flexion      Wrist extension      Wrist ulnar deviation      Wrist radial deviation      Wrist pronation      GRIP STRENGTH 50 19 60 53   (Blank rows = not tested)  UPPER EXTREMITY ROM:  Active ROM Right eval Left eval  Shoulder flexion WFL 60  Shoulder extension    Shoulder abduction WFL 85 pain  Shoulder adduction    Shoulder extension    Shoulder internal rotation    Shoulder external rotation    Elbow flexion    Elbow extension    Wrist flexion    Wrist extension    Wrist ulnar deviation    Wrist radial deviation    Wrist pronation    Wrist supination     (Blank rows = not tested)  CERVICAL SPECIAL TESTS:  Spurling's test: Positive   [facility] Adult PT Treatment:                                                DATE: 07/14/2023 Therapeutic Exercise: Bilateral UE MMT Review of cervical isometrics for postural  Therapeutic Activity: NDI Updating LTGs Discussion of POC, cervical posture in sitting vs standing, recommended follow-up with surgeon  once MRI is reviewed, progress with POC   [facility] Adult PT Treatment:                                                DATE: 07/07/2023 Therapeutic Exercise: Seated: Scap squeezes + chest lift AAROM cervical rotation stretch Supine shoulder flexion + 1#dowel   Manual Therapy: STM cervical musculature Passive cervical lateral flexion and rotation stretches Passive scalenes/SCM stretches Neuromuscular re-ed: Standing with noodle: Bent arm shoulder abduction Shoulder flexion AROM Supine cervical isometrics Seated hip hinge fwd <--> resisted trunk extension with black super band Seated dead lift + 5#KB --> cueing posture    [facility] Adult PT Treatment:                                                DATE: 07/02/2023 Therapeutic Exercise: Seated: Bent arm shoulder abd --> tactile cue for scap stability AAROM cervical rotation stretch Shoulder ER +  YTB Shoulder horizontal abd + YTB UE D2 --> Lt no resistance, Rt YTB Neuromuscular re-ed: Supine on incline with pillow and towel roll --> gradually lowering incline: Cervical retraction Chest lift + scap retraction Seated: Scap squeeze + chest left AAROM shoulder flexion with dowel --> added 1# Shoulder isometrics --> flexion, abd, ER, ext Cervical isometrics --> flexion, extension, side bending   PATIENT EDUCATION:  Education details: Updated HEP Person educated: Patient Education method: Explanation, Demonstration, and Handouts Education comprehension: verbalized understanding and returned demonstration  HOME EXERCISE PROGRAM: Access Code: [email or web address] URL: [email or web address] Date: 07/02/2023 Prepared by: Sims Duck  Exercises - Seated Scapular Retraction  - 1 x daily - 7 x weekly - 2 sets - 10 reps - Supine Cervical Retraction with Towel  - 1 x daily - 7 x weekly - 3 sets - 10 reps - Supine Cervical Rotation AROM on Pillow  - 1 x daily - 7 x weekly - 3 sets - 10 reps - Seated Shoulder Row with Anchored  Resistance  - 1 x daily - 7 x weekly - 3 sets - 10 reps - Dead Bug  - 1 x daily - 7 x weekly - 3 sets - 10 reps - Shoulder Flexion Wall Slide with Towel  - 1 x daily - 7 x weekly - 3 sets - 10 reps - Standing Isometric Cervical Sidebending with Manual Resistance  - 2 x daily - 7 x weekly - 2 sets - 10 reps - 5 sec hold - Standing Isometric Cervical Flexion with Manual Resistance  - 2 x daily - 7 x weekly - 2 sets - 10 reps - 5 sec hold - Standing Isometric Cervical Extension with Manual Resistance  - 2 x daily - 7 x weekly - 2 sets - 10 reps - 5 sec hold - Standing Isometric Shoulder Abduction with Doorway  - 2 x daily - 7 x weekly - 2 sets - 10 reps - 5 sec hold - Standing Isometric Shoulder Abduction with Doorway - Arm Bent  - 2 x daily - 7 x weekly - 2 sets - 10 reps - 5 sec hold - Seated Shoulder Horizontal Abduction with Resistance  - 1 x daily - 7 x weekly - 3 sets - 10 reps - Seated Single Arm Shoulder External Rotation with Self-Anchored Resistance  - 1 x daily - 7 x weekly - 3 sets - 10 reps  ASSESSMENT:  CLINICAL IMPRESSION:  Lt shoulder strength has improved since eval as measured by MMT; significant improvement measured in Lt grip strength, as well as improved Rt grip strength. Mild regression in NDI score as compared to previous intake. Body mechanics and postural alignment reviewed with cervical isometrics from HEP. No change in bilateral UE numbness/tingling. Patient will continue to benefit from skilled therapy to address on-gong strength and postural deficits.     OBJECTIVE IMPAIRMENTS: decreased activity tolerance, decreased mobility, decreased ROM, decreased strength, increased muscle spasms, impaired sensation, impaired UE functional use, postural dysfunction, and pain.     GOALS: Goals reviewed with patient? Yes  SHORT TERM GOALS: Target date: 07/01/2023  Pt will be independent in initial HEP Baseline:  Goal status: MET  2.  Pt will improve Lt UE AROM to 90 degrees  flexion and 100 degrees abduction Baseline: see above 06/30/23: flexion 108 deg, abd 76 deg Goal status: IN PROGRESS   LONG TERM GOALS: Target date: 07/29/2023  Pt will be independent with advanced HEP Baseline:  Goal status: MET  2.  NDI will improve to <= 16/50 to demo improved functional mobility Baseline: 26/50 07/14/23: 22/50 = 44.0% Goal status: IN PROGRESS  3.  Pt will improve Lt UE strength to 4/5 to improve functional activity tolerance Baseline: see above 07/14/23: see above Goal status: IN PROGRESS  4.  Pt will report 50% reduction in numbness in bilat UEs Baseline:  07/07/23: no change 07/14/23: no change Goal status: NOT MET     PLAN:  PT FREQUENCY: 2x/week  PT DURATION: 8 weeks  PLANNED INTERVENTIONS: 97164- PT Re-evaluation, 97110-Therapeutic exercises, 97530- Therapeutic activity, 97112- Neuromuscular re-education, 97535- Self Care, 21308- Manual therapy, [id number]- Aquatic Therapy, [id number]- Electrical stimulation (unattended), Patient/Family education, Taping, Dry Needling, Cryotherapy, and Moist heat  PLAN FOR NEXT SESSION: Postural strength, cervical ROM and strength; gradually lower incline as tolerated; standing exercises as tolerated.   Flint Hummer, PTA 07/14/2023, 11:54 AM

## 2023-07-16 ENCOUNTER — Ambulatory Visit

## 2023-07-16 DIAGNOSIS — R29898 Other symptoms and signs involving the musculoskeletal system: Secondary | ICD-10-CM | POA: Diagnosis not present

## 2023-07-16 DIAGNOSIS — M6281 Muscle weakness (generalized): Secondary | ICD-10-CM | POA: Diagnosis not present

## 2023-07-16 NOTE — Therapy (Signed)
 OUTPATIENT PHYSICAL THERAPY CERVICAL TREATMENT    Patient Name: Damon Acosta MRN: [id number] DOB:1940/11/18, 83 y.o., male Today's Date: 07/16/2023  END OF SESSION:  PT End of Session - 07/16/23 1146     Visit Number 11    Number of Visits 16    Date for PT Re-Evaluation 07/29/23    Authorization Type Medicare    Authorization - Visit Number 11    Authorization - Number of Visits 16    PT Start Time 1145    PT Stop Time 1227    PT Time Calculation (min) 42 min    Activity Tolerance Patient tolerated treatment well    Behavior During Therapy WFL for tasks assessed/performed            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   CKD (chronic kidney disease)    Complication of anesthesia    patient had dificulty waking up   Coronary artery disease    Diabetes mellitus without complication ([name])    Gout    Hyperlipidemia    Hypertension    Myocardial infarction ([name])    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   colonic pseudo-obstruction, s/p colectomy with ileostomy 12/2013   Overactive bladder    Sleep apnea    Stroke [facility])    Past Surgical History:  Procedure Laterality Date   CHOLECYSTECTOMY     COLOSTOMY     POSTERIOR CERVICAL LAMINECTOMY N/A 02/09/2023   Procedure: Laminectomy  Cervical two - Cervical seven;  Surgeon: Agustina Aldrich, MD;  Location: [facility] OR;  Service: Neurosurgery;  Laterality: N/A;   PROSTATE SURGERY     Patient Active Problem List   Diagnosis Date Noted   Cervical spondylosis with myelopathy and radiculopathy 02/09/2023   Atopic dermatitis 10/28/2022   Throat clearing 09/16/2022   Dyspnea 07/07/2022   Urinary retention 06/04/2022   Cognitive change 06/04/2022   Insomnia 03/17/2022   Callus of foot, right heel  12/11/2021   Cervical cord myelomalacia ([name]) 10/30/2021   AV block, 1st degree 08/14/2021   Sinus bradycardia on ECG 08/14/2021   OSA (obstructive sleep apnea) 08/01/2021   History of MI (myocardial infarction) 10/21/2019   Diversion colitis 10/07/2019   Gastroesophageal reflux disease without esophagitis 10/07/2019   PAF (paroxysmal atrial fibrillation) ([name]) 09/16/2019   Atrial flutter ([name]) 09/15/2019   NSTEMI (non-ST elevated myocardial infarction) ([name]) 09/15/2019   Recurrent major depressive disorder, in partial remission ([name]) 09/15/2019   Coronary artery disease of native artery of native heart with stable angina pectoris ([name]) 09/15/2019   Severe episode of recurrent major depressive disorder, without psychotic features ([name]) 09/15/2019   Asymptomatic PVCs 07/27/2018   CKD (chronic kidney disease) stage 3, GFR 30-59 ml/min ([name]) 04/27/2018   Mild cognitive impairment 04/20/2018   Microcytic anemia 04/20/2018   Primary osteoarthritis of both knees 02/17/2018   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   Type 2 diabetes mellitus with chronic kidney disease, without long-term current use of insulin  ([name]) 01/06/2018   Dyslipidemia associated with type 2 diabetes mellitus ([name]) 01/06/2018   OAB (overactive bladder) 01/06/2018   History of prostate cancer 01/06/2018   Lumbar spinal stenosis 12/15/2017   Colostomy status ([name]) 12/15/2017   Hypertension associated with diabetes ([name]) 12/15/2017   Gout  12/15/2017   History of CVA (cerebrovascular accident) 12/15/2017   Ileostomy in place [facility]) 12/15/2017    PCP: Vincenza Greener PROVIDER: Agustina Aldrich  REFERRING DIAG: cervical spinal stenosis with myelopathy  THERAPY DIAG:  Muscle weakness (generalized)  Other symptoms and signs involving the musculoskeletal system  Rationale for Evaluation and Treatment: Rehabilitation  ONSET DATE: 02/09/24  SUBJECTIVE:                                                                                                                                                                                                          SUBJECTIVE STATEMENT: Patient reports no changes in being able to keep his head up for long when standing/walking; states he legs feel very weak if he is walking without the walker.   PERTINENT HISTORY:  Lumbar spinal stenosis CVA CKD NSTEMI  Pt is s/p cervical laminectomy C2-C7 on 02/09/24. Since the surgery he continues with pain in his neck and with Lt UE weakness and bilat UE numbness/tingling. Prior to surgery he used a PFRW for ambulation but he has had a few recent falls and is now using a power w/c for mobility in and out of the house. Pt reports difficulty using Lt UE for functional activities. He reports he drops items often due to numbness in hands. Numbness does not get better or worse, it is just "always there".  Hand dominance: Right  PAIN:  Are you having pain? Yes: NPRS scale: 6/10 currently, 8/10 at worst Pain location: neck, LT UE Pain description: sore, ache Aggravating factors: increased use of Lt UE Relieving factors: meds  PRECAUTIONS: Fall  RED FLAGS: None     WEIGHT BEARING RESTRICTIONS: No  FALLS:  Has patient fallen in last 6 months? Yes. Number of falls 2-3  LIVING ENVIRONMENT: Lives with: lives with their family Lives in: House/apartment Stairs: No Has following equipment at home: Environmental consultant - 2 wheeled and Wheelchair (power)  OCCUPATION: retired  PLOF: Independent with basic ADLs, Independent with household mobility with device, and Requires assistive device for independence  PATIENT GOALS: decrease pain, get Lt UE to work, decrease numbness  NEXT MD VISIT: none on file  OBJECTIVE:  Note: Objective measures were completed at Evaluation unless otherwise noted.  DIAGNOSTIC FINDINGS:  None on file since surgery  PATIENT SURVEYS:  NDI 26/50 - 52%  COGNITION: Overall cognitive status: History of cognitive impairments -  at baseline  SENSATION: Light touch: Impaired   POSTURE: forward head  PALPATION: Increased mm spasticity Rt > Lt cervical paraspinals Increased tenderness to light touch  throughout cervical musculature, UT bilat Some decreased symptoms with manual traction   CERVICAL ROM:   Active ROM A/PROM (deg) eval  Flexion 34  Extension 14 pain  Right lateral flexion 35  Left lateral flexion 25  Right rotation 38  Left rotation 40   (Blank rows = not tested)  UPPER EXTREMITY MMT:  MMT Right eval Left eval Right 07/14/23 Left 07/14/23  Shoulder flexion 4 3- 4 4-  Shoulder extension      Shoulder abduction 4 3- 4 4-  Shoulder adduction      Shoulder extension      Shoulder internal rotation      Shoulder external rotation      Elbow flexion 4+ 3+ 4+ 4-  Elbow extension 4+ 3 4+ 4+  Wrist flexion      Wrist extension      Wrist ulnar deviation      Wrist radial deviation      Wrist pronation      GRIP STRENGTH 50 19 60 53   (Blank rows = not tested)  UPPER EXTREMITY ROM:  Active ROM Right eval Left eval  Shoulder flexion WFL 60  Shoulder extension    Shoulder abduction WFL 85 pain  Shoulder adduction    Shoulder extension    Shoulder internal rotation    Shoulder external rotation    Elbow flexion    Elbow extension    Wrist flexion    Wrist extension    Wrist ulnar deviation    Wrist radial deviation    Wrist pronation    Wrist supination     (Blank rows = not tested)  CERVICAL SPECIAL TESTS:  Spurling's test: Positive    [facility] Adult PT Treatment:                                                DATE: 07/16/2023 Therapeutic Exercise: Seated with noodle: Scap squeeze + chest lift Bkwd shoulder circles Bent arm shoulder abd --> added 1#DB Cervical AROM all directions Thoracic extension + t arm stretch Neuromuscular re-ed: Seated: Cervical isometrics with therapist assist Standing back against wall: Cervical retraction into folded pillow Isometric chin  tuck into folded pillow + arm raises holding 1#DB in flexion & scaption Supine, HOT inclined + towel roll behind neck: Shoulder flexion + 1#dowel Arm circles CW/CCW + 1#DB Punches + 1#DB Hug + elbow lift <--> T arm stretch Therapeutic Activity: Seated: Front arm raises holding 1#DB + cue to track gaze on dumbbell 3-way bicep curls x10 each position + 2#DB --> 1#DB Lt on reverse grip curl   [facility] Adult PT Treatment:                                                DATE: 07/14/2023 Therapeutic Exercise: Bilateral UE MMT Review of cervical isometrics for postural  Therapeutic Activity: NDI Updating LTGs Discussion of POC, cervical posture in sitting vs standing, recommended follow-up with surgeon once MRI is reviewed, progress with POC   [facility] Adult PT Treatment:  DATE: 07/07/2023 Therapeutic Exercise: Seated: Scap squeezes + chest lift AAROM cervical rotation stretch Supine shoulder flexion + 1#dowel   Manual Therapy: STM cervical musculature Passive cervical lateral flexion and rotation stretches Passive scalenes/SCM stretches Neuromuscular re-ed: Standing with noodle: Bent arm shoulder abduction Shoulder flexion AROM Supine cervical isometrics Seated hip hinge fwd <--> resisted trunk extension with black super band Seated dead lift + 5#KB --> cueing posture   PATIENT EDUCATION:  Education details: Updated HEP Person educated: Patient Education method: Explanation, Demonstration, and Handouts Education comprehension: verbalized understanding and returned demonstration  HOME EXERCISE PROGRAM: Access Code: [email or web address] URL: [email or web address] Date: 07/02/2023 Prepared by: Sims Duck  Exercises - Seated Scapular Retraction  - 1 x daily - 7 x weekly - 2 sets - 10 reps - Supine Cervical Retraction with Towel  - 1 x daily - 7 x weekly - 3 sets - 10 reps - Supine Cervical Rotation AROM on Pillow  - 1 x daily - 7  x weekly - 3 sets - 10 reps - Seated Shoulder Row with Anchored Resistance  - 1 x daily - 7 x weekly - 3 sets - 10 reps - Dead Bug  - 1 x daily - 7 x weekly - 3 sets - 10 reps - Shoulder Flexion Wall Slide with Towel  - 1 x daily - 7 x weekly - 3 sets - 10 reps - Standing Isometric Cervical Sidebending with Manual Resistance  - 2 x daily - 7 x weekly - 2 sets - 10 reps - 5 sec hold - Standing Isometric Cervical Flexion with Manual Resistance  - 2 x daily - 7 x weekly - 2 sets - 10 reps - 5 sec hold - Standing Isometric Cervical Extension with Manual Resistance  - 2 x daily - 7 x weekly - 2 sets - 10 reps - 5 sec hold - Standing Isometric Shoulder Abduction with Doorway  - 2 x daily - 7 x weekly - 2 sets - 10 reps - 5 sec hold - Standing Isometric Shoulder Abduction with Doorway - Arm Bent  - 2 x daily - 7 x weekly - 2 sets - 10 reps - 5 sec hold - Seated Shoulder Horizontal Abduction with Resistance  - 1 x daily - 7 x weekly - 3 sets - 10 reps - Seated Single Arm Shoulder External Rotation with Self-Anchored Resistance  - 1 x daily - 7 x weekly - 3 sets - 10 reps  ASSESSMENT:  CLINICAL IMPRESSION:  Functional arm raises and reaching activities progressed with light weight and cueing for visual tracking is sitting. Cervical retraction isometrics incorporated in standing with functional arm raises to progress upright posture tolerance. Light weights incorporated with functional reaching to challenge strength and endurance.    OBJECTIVE IMPAIRMENTS: decreased activity tolerance, decreased mobility, decreased ROM, decreased strength, increased muscle spasms, impaired sensation, impaired UE functional use, postural dysfunction, and pain.     GOALS: Goals reviewed with patient? Yes  SHORT TERM GOALS: Target date: 07/01/2023  Pt will be independent in initial HEP Baseline:  Goal status: MET  2.  Pt will improve Lt UE AROM to 90 degrees flexion and 100 degrees abduction Baseline: see  above 06/30/23: flexion 108 deg, abd 76 deg Goal status: IN PROGRESS   LONG TERM GOALS: Target date: 07/29/2023  Pt will be independent with advanced HEP Baseline:  Goal status: MET  2.  NDI will improve to <= 16/50 to demo improved functional mobility Baseline: 26/50 07/14/23: 22/50 = 44.0%  Goal status: IN PROGRESS  3.  Pt will improve Lt UE strength to 4/5 to improve functional activity tolerance Baseline: see above 07/14/23: see above Goal status: IN PROGRESS  4.  Pt will report 50% reduction in numbness in bilat UEs Baseline:  07/07/23: no change 07/14/23: no change Goal status: NOT MET    PLAN:  PT FREQUENCY: 2x/week  PT DURATION: 8 weeks  PLANNED INTERVENTIONS: [id number]- PT Re-evaluation, 97110-Therapeutic exercises, 97530- Therapeutic activity, 97112- Neuromuscular re-education, 97535- Self Care, [id number]- Manual therapy, [id number]- Aquatic Therapy, [id number]- Electrical stimulation (unattended), Patient/Family education, Taping, Dry Needling, Cryotherapy, and Moist heat  PLAN FOR NEXT SESSION: Standing cervical retraction isometrics, adding functional arm raises. Cervical isometrics, cervical ROM and strength. Progress standing exercises as tolerated.   Flint Hummer, PTA 07/16/2023, 12:32 PM

## 2023-07-21 ENCOUNTER — Ambulatory Visit: Admitting: Physical Therapy

## 2023-07-21 ENCOUNTER — Encounter: Payer: Self-pay | Admitting: Physical Therapy

## 2023-07-21 DIAGNOSIS — M6281 Muscle weakness (generalized): Secondary | ICD-10-CM | POA: Diagnosis not present

## 2023-07-21 DIAGNOSIS — R29898 Other symptoms and signs involving the musculoskeletal system: Secondary | ICD-10-CM

## 2023-07-21 NOTE — Therapy (Signed)
 OUTPATIENT PHYSICAL THERAPY CERVICAL TREATMENT    Patient Name: Damon Acosta MRN: [id number] DOB:1940-04-21, 83 y.o., male Today's Date: 07/21/2023  END OF SESSION:  PT End of Session - 07/21/23 1143     Visit Number 12    Number of Visits 16    Date for PT Re-Evaluation 07/29/23    Authorization Type Medicare    Authorization - Visit Number 12    Progress Note Due on Visit 20    PT Start Time 1101    PT Stop Time 1143    PT Time Calculation (min) 42 min    Activity Tolerance Patient tolerated treatment well    Behavior During Therapy [facility] for tasks assessed/performed             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   CKD (chronic kidney disease)    Complication of anesthesia    patient had dificulty waking up   Coronary artery disease    Diabetes mellitus without complication ([name])    Gout    Hyperlipidemia    Hypertension    Myocardial infarction ([name])    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   colonic pseudo-obstruction, s/p colectomy with ileostomy 12/2013   Overactive bladder    Sleep apnea    Stroke [facility])    Past Surgical History:  Procedure Laterality Date   CHOLECYSTECTOMY     COLOSTOMY     POSTERIOR CERVICAL LAMINECTOMY N/A 02/09/2023   Procedure: Laminectomy  Cervical two - Cervical seven;  Surgeon: Agustina Aldrich, MD;  Location: [facility] OR;  Service: Neurosurgery;  Laterality: N/A;   PROSTATE SURGERY     Patient Active Problem List   Diagnosis Date Noted   Cervical spondylosis with myelopathy and radiculopathy 02/09/2023   Atopic dermatitis 10/28/2022   Throat clearing 09/16/2022   Dyspnea 07/07/2022   Urinary retention 06/04/2022   Cognitive change 06/04/2022   Insomnia 03/17/2022   Callus of foot, right heel  12/11/2021   Cervical cord myelomalacia ([name]) 10/30/2021   AV block, 1st degree 08/14/2021   Sinus bradycardia on ECG 08/14/2021   OSA (obstructive sleep apnea) 08/01/2021   History of MI (myocardial infarction) 10/21/2019   Diversion colitis 10/07/2019   Gastroesophageal reflux disease without esophagitis 10/07/2019   PAF (paroxysmal atrial fibrillation) ([name]) 09/16/2019   Atrial flutter ([name]) 09/15/2019   NSTEMI (non-ST elevated myocardial infarction) ([name]) 09/15/2019   Recurrent major depressive disorder, in partial remission ([name]) 09/15/2019   Coronary artery disease of native artery of native heart with stable angina pectoris ([name]) 09/15/2019   Severe episode of recurrent major depressive disorder, without psychotic features ([name]) 09/15/2019   Asymptomatic PVCs 07/27/2018   CKD (chronic kidney disease) stage 3, GFR 30-59 ml/min ([name]) 04/27/2018   Mild cognitive impairment 04/20/2018   Microcytic anemia 04/20/2018   Primary osteoarthritis of both knees 02/17/2018   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   Type 2 diabetes mellitus with chronic kidney disease, without long-term current use of insulin  ([name]) 01/06/2018   Dyslipidemia associated with type 2 diabetes mellitus ([name]) 01/06/2018   OAB (overactive bladder) 01/06/2018   History of prostate cancer 01/06/2018   Lumbar spinal stenosis 12/15/2017   Colostomy status ([name]) 12/15/2017   Hypertension associated with diabetes ([name]) 12/15/2017  Gout 12/15/2017   History of CVA (cerebrovascular accident) 12/15/2017   Ileostomy in place [facility]) 12/15/2017    PCP: Vincenza Greener PROVIDER: Agustina Aldrich  REFERRING DIAG: cervical spinal stenosis with myelopathy  THERAPY DIAG:  Muscle weakness (generalized)  Other symptoms and signs involving the musculoskeletal system  Rationale for Evaluation and Treatment: Rehabilitation  ONSET DATE: 02/09/24  SUBJECTIVE:                                                                                                                                                                                                          SUBJECTIVE STATEMENT: Patient reports no changes in being able to keep his head up for long when standing/walking; states he legs feel very weak if he is walking without the walker.   PERTINENT HISTORY:  Lumbar spinal stenosis CVA CKD NSTEMI  Pt is s/p cervical laminectomy C2-C7 on 02/09/24. Since the surgery he continues with pain in his neck and with Lt UE weakness and bilat UE numbness/tingling. Prior to surgery he used a PFRW for ambulation but he has had a few recent falls and is now using a power w/c for mobility in and out of the house. Pt reports difficulty using Lt UE for functional activities. He reports he drops items often due to numbness in hands. Numbness does not get better or worse, it is just "always there".  Hand dominance: Right  PAIN:  Are you having pain? Yes: NPRS scale: 6/10 currently, 8/10 at worst Pain location: neck, LT UE Pain description: sore, ache Aggravating factors: increased use of Lt UE Relieving factors: meds  PRECAUTIONS: Fall  RED FLAGS: None     WEIGHT BEARING RESTRICTIONS: No  FALLS:  Has patient fallen in last 6 months? Yes. Number of falls 2-3  LIVING ENVIRONMENT: Lives with: lives with their family Lives in: House/apartment Stairs: No Has following equipment at home: Environmental consultant - 2 wheeled and Wheelchair (power)  OCCUPATION: retired  PLOF: Independent with basic ADLs, Independent with household mobility with device, and Requires assistive device for independence  PATIENT GOALS: decrease pain, get Lt UE to work, decrease numbness  NEXT MD VISIT: none on file  OBJECTIVE:  Note: Objective measures were completed at Evaluation unless otherwise noted.  DIAGNOSTIC FINDINGS:  None on file since surgery  PATIENT SURVEYS:  NDI 26/50 - 52%  COGNITION: Overall cognitive status: History of cognitive impairments -  at baseline  SENSATION: Light touch: Impaired   POSTURE: forward head  PALPATION: Increased mm spasticity Rt > Lt cervical paraspinals Increased tenderness to light  touch throughout cervical musculature, UT bilat Some decreased symptoms with manual traction   CERVICAL ROM:   Active ROM A/PROM (deg) eval  Flexion 34  Extension 14 pain  Right lateral flexion 35  Left lateral flexion 25  Right rotation 38  Left rotation 40   (Blank rows = not tested)  UPPER EXTREMITY MMT:  MMT Right eval Left eval Right 07/14/23 Left 07/14/23  Shoulder flexion 4 3- 4 4-  Shoulder extension      Shoulder abduction 4 3- 4 4-  Shoulder adduction      Shoulder extension      Shoulder internal rotation      Shoulder external rotation      Elbow flexion 4+ 3+ 4+ 4-  Elbow extension 4+ 3 4+ 4+  Wrist flexion      Wrist extension      Wrist ulnar deviation      Wrist radial deviation      Wrist pronation      GRIP STRENGTH 50 19 60 53   (Blank rows = not tested)  UPPER EXTREMITY ROM:  Active ROM Right eval Left eval  Shoulder flexion WFL 60  Shoulder extension    Shoulder abduction WFL 85 pain  Shoulder adduction    Shoulder extension    Shoulder internal rotation    Shoulder external rotation    Elbow flexion    Elbow extension    Wrist flexion    Wrist extension    Wrist ulnar deviation    Wrist radial deviation    Wrist pronation    Wrist supination     (Blank rows = not tested)  CERVICAL SPECIAL TESTS:  Spurling's test: Positive    [facility] Adult PT Treatment:                                                DATE: 07/21/23 Therapeutic Exercise: Seated with noodle Scap squeeze with chest lift Backward shoulder circles Cervical AROM all directions T arm stretch Bent arm shoulder abduction with 1# DB  Neuromuscular re-ed: Cervical isometrics with therapist assist Standing Cervical retraction into folded pillow Cervical retraction into pillow with bilat shoulder  scaption --> unable to tolerate with 1#DB --> performed without weight Seated with head of table elevated Shoulder flexion 1# DB Punches 1# DB Bent arm shoulder abd 1# T stretch Y stretch Therapeutic Activity: Seated Front arm raises 1# DB x 10 with cues to prevent compensation Bicep curls 3 ways 1# DB all 2 x 10   [facility] Adult PT Treatment:                                                DATE: 07/16/2023 Therapeutic Exercise: Seated with noodle: Scap squeeze + chest lift Bkwd shoulder circles Bent arm shoulder abd --> added 1#DB Cervical AROM all directions Thoracic extension + t arm stretch Neuromuscular re-ed: Seated: Cervical isometrics with therapist assist Standing back against wall: Cervical retraction into folded pillow Isometric chin tuck into folded pillow + arm raises holding 1#DB in flexion & scaption Supine, HOT inclined + towel roll behind neck: Shoulder flexion + 1#dowel Arm circles CW/CCW + 1#DB Punches + 1#DB Hug + elbow lift <--> T arm stretch Therapeutic  Activity: Seated: Front arm raises holding 1#DB + cue to track gaze on dumbbell 3-way bicep curls x10 each position + 2#DB --> 1#DB Lt on reverse grip curl   [facility] Adult PT Treatment:                                                DATE: 07/14/2023 Therapeutic Exercise: Bilateral UE MMT Review of cervical isometrics for postural  Therapeutic Activity: NDI Updating LTGs Discussion of POC, cervical posture in sitting vs standing, recommended follow-up with surgeon once MRI is reviewed, progress with POC     PATIENT EDUCATION:  Education details: Updated HEP Person educated: Patient Education method: Explanation, Demonstration, and Handouts Education comprehension: verbalized understanding and returned demonstration  HOME EXERCISE PROGRAM: Access Code: [email or web address] URL: [email or web address] Date: 07/02/2023 Prepared by: Sims Duck  Exercises - Seated Scapular Retraction  - 1 x daily  - 7 x weekly - 2 sets - 10 reps - Supine Cervical Retraction with Towel  - 1 x daily - 7 x weekly - 3 sets - 10 reps - Supine Cervical Rotation AROM on Pillow  - 1 x daily - 7 x weekly - 3 sets - 10 reps - Seated Shoulder Row with Anchored Resistance  - 1 x daily - 7 x weekly - 3 sets - 10 reps - Dead Bug  - 1 x daily - 7 x weekly - 3 sets - 10 reps - Shoulder Flexion Wall Slide with Towel  - 1 x daily - 7 x weekly - 3 sets - 10 reps - Standing Isometric Cervical Sidebending with Manual Resistance  - 2 x daily - 7 x weekly - 2 sets - 10 reps - 5 sec hold - Standing Isometric Cervical Flexion with Manual Resistance  - 2 x daily - 7 x weekly - 2 sets - 10 reps - 5 sec hold - Standing Isometric Cervical Extension with Manual Resistance  - 2 x daily - 7 x weekly - 2 sets - 10 reps - 5 sec hold - Standing Isometric Shoulder Abduction with Doorway  - 2 x daily - 7 x weekly - 2 sets - 10 reps - 5 sec hold - Standing Isometric Shoulder Abduction with Doorway - Arm Bent  - 2 x daily - 7 x weekly - 2 sets - 10 reps - 5 sec hold - Seated Shoulder Horizontal Abduction with Resistance  - 1 x daily - 7 x weekly - 3 sets - 10 reps - Seated Single Arm Shoulder External Rotation with Self-Anchored Resistance  - 1 x daily - 7 x weekly - 3 sets - 10 reps  ASSESSMENT:  CLINICAL IMPRESSION: Discussed completing POC and then following up with surgeon to determine next steps. Pt is to call surgeon to schedule appointment. He has improved UE strength and neck ROM but continues with decreased endurance and states he is only able to hold his head up for about 20 minutes in the morning before pain sets in. If he exercises he feels he can hold head up 3-4 minutes before he is unable to hold it up due to fatigue   OBJECTIVE IMPAIRMENTS: decreased activity tolerance, decreased mobility, decreased ROM, decreased strength, increased muscle spasms, impaired sensation, impaired UE functional use, postural dysfunction, and pain.      GOALS: Goals reviewed with patient? Yes  SHORT TERM GOALS: Target date:  07/01/2023  Pt will be independent in initial HEP Baseline:  Goal status: MET  2.  Pt will improve Lt UE AROM to 90 degrees flexion and 100 degrees abduction Baseline: see above 06/30/23: flexion 108 deg, abd 76 deg Goal status: IN PROGRESS   LONG TERM GOALS: Target date: 07/29/2023  Pt will be independent with advanced HEP Baseline:  Goal status: MET  2.  NDI will improve to <= 16/50 to demo improved functional mobility Baseline: 26/50 07/14/23: 22/50 = 44.0% Goal status: IN PROGRESS  3.  Pt will improve Lt UE strength to 4/5 to improve functional activity tolerance Baseline: see above 07/14/23: see above Goal status: IN PROGRESS  4.  Pt will report 50% reduction in numbness in bilat UEs Baseline:  07/07/23: no change 07/14/23: no change Goal status: NOT MET    PLAN:  PT FREQUENCY: 2x/week  PT DURATION: 8 weeks  PLANNED INTERVENTIONS: [id number]- PT Re-evaluation, 97110-Therapeutic exercises, 97530- Therapeutic activity, 97112- Neuromuscular re-education, 97535- Self Care, [id number]- Manual therapy, [id number]- Aquatic Therapy, [id number]- Electrical stimulation (unattended), Patient/Family education, Taping, Dry Needling, Cryotherapy, and Moist heat  PLAN FOR NEXT SESSION: Standing cervical retraction isometrics, adding functional arm raises. Cervical isometrics, cervical ROM and strength. Progress standing exercises as tolerated.   Fathima Bartl, PT 07/21/2023, 11:44 AM

## 2023-07-23 ENCOUNTER — Ambulatory Visit

## 2023-07-23 DIAGNOSIS — M6281 Muscle weakness (generalized): Secondary | ICD-10-CM

## 2023-07-23 DIAGNOSIS — R29898 Other symptoms and signs involving the musculoskeletal system: Secondary | ICD-10-CM

## 2023-07-23 NOTE — Therapy (Signed)
 OUTPATIENT PHYSICAL THERAPY CERVICAL TREATMENT    Patient Name: Damon Acosta MRN: [id number] DOB:11/26/1940, 83 y.o., male Today's Date: 07/23/2023  END OF SESSION:  PT End of Session - 07/23/23 1152     Visit Number 13    Number of Visits 16    Date for PT Re-Evaluation 07/29/23    Authorization Type Medicare    Authorization - Visit Number 13    Authorization - Number of Visits 16    PT Start Time 1151    PT Stop Time 1229    PT Time Calculation (min) 38 min    Activity Tolerance Patient tolerated treatment well    Behavior During Therapy WFL for tasks assessed/performed         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   CKD (chronic kidney disease)    Complication of anesthesia    patient had dificulty waking up   Coronary artery disease    Diabetes mellitus without complication ([name])    Gout    Hyperlipidemia    Hypertension    Myocardial infarction ([name])    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   colonic pseudo-obstruction, s/p colectomy with ileostomy 12/2013   Overactive bladder    Sleep apnea    Stroke [facility])    Past Surgical History:  Procedure Laterality Date   CHOLECYSTECTOMY     COLOSTOMY     POSTERIOR CERVICAL LAMINECTOMY N/A 02/09/2023   Procedure: Laminectomy  Cervical two - Cervical seven;  Surgeon: Agustina Aldrich, MD;  Location: [facility] OR;  Service: Neurosurgery;  Laterality: N/A;   PROSTATE SURGERY     Patient Active Problem List   Diagnosis Date Noted   Cervical spondylosis with myelopathy and radiculopathy 02/09/2023   Atopic dermatitis 10/28/2022   Throat clearing 09/16/2022   Dyspnea 07/07/2022   Urinary retention 06/04/2022   Cognitive change 06/04/2022   Insomnia 03/17/2022   Callus of foot, right heel  12/11/2021   Cervical cord myelomalacia ([name]) 10/30/2021   AV block, 1st degree 08/14/2021   Sinus bradycardia on ECG 08/14/2021   OSA (obstructive sleep apnea) 08/01/2021   History of MI (myocardial infarction) 10/21/2019   Diversion colitis 10/07/2019   Gastroesophageal reflux disease without esophagitis 10/07/2019   PAF (paroxysmal atrial fibrillation) ([name]) 09/16/2019   Atrial flutter ([name]) 09/15/2019   NSTEMI (non-ST elevated myocardial infarction) ([name]) 09/15/2019   Recurrent major depressive disorder, in partial remission ([name]) 09/15/2019   Coronary artery disease of native artery of native heart with stable angina pectoris ([name]) 09/15/2019   Severe episode of recurrent major depressive disorder, without psychotic features ([name]) 09/15/2019   Asymptomatic PVCs 07/27/2018   CKD (chronic kidney disease) stage 3, GFR 30-59 ml/min ([name]) 04/27/2018   Mild cognitive impairment 04/20/2018   Microcytic anemia 04/20/2018   Primary osteoarthritis of both knees 02/17/2018   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   Type 2 diabetes mellitus with chronic kidney disease, without long-term current use of insulin  ([name]) 01/06/2018   Dyslipidemia associated with type 2 diabetes mellitus ([name]) 01/06/2018   OAB (overactive bladder) 01/06/2018   History of prostate cancer 01/06/2018   Lumbar spinal stenosis 12/15/2017   Colostomy status ([name]) 12/15/2017   Hypertension associated with diabetes ([name]) 12/15/2017   Gout 12/15/2017  History of CVA (cerebrovascular accident) 12/15/2017   Ileostomy in place [facility]) 12/15/2017    PCP: Vincenza Greener PROVIDER: Agustina Aldrich  REFERRING DIAG: cervical spinal stenosis with myelopathy  THERAPY DIAG:  Muscle weakness (generalized)  Other symptoms and signs involving the musculoskeletal system  Rationale for Evaluation and Treatment: Rehabilitation  ONSET DATE: 02/09/24  SUBJECTIVE:                                                                                                                                                                                                          SUBJECTIVE STATEMENT: Patient reports he is working on standing exercises at home and uses pedaler for his legs.    PERTINENT HISTORY:  Lumbar spinal stenosis CVA CKD NSTEMI  Pt is s/p cervical laminectomy C2-C7 on 02/09/24. Since the surgery he continues with pain in his neck and with Lt UE weakness and bilat UE numbness/tingling. Prior to surgery he used a PFRW for ambulation but he has had a few recent falls and is now using a power w/c for mobility in and out of the house. Pt reports difficulty using Lt UE for functional activities. He reports he drops items often due to numbness in hands. Numbness does not get better or worse, it is just always there.  Hand dominance: Right  PAIN:  Are you having pain? Yes: NPRS scale: 6/10 currently, 8/10 at worst Pain location: neck, LT UE Pain description: sore, ache Aggravating factors: increased use of Lt UE Relieving factors: meds  PRECAUTIONS: Fall  RED FLAGS: None     WEIGHT BEARING RESTRICTIONS: No  FALLS:  Has patient fallen in last 6 months? Yes. Number of falls 2-3  LIVING ENVIRONMENT: Lives with: lives with their family Lives in: House/apartment Stairs: No Has following equipment at home: Environmental consultant - 2 wheeled and Wheelchair (power)  OCCUPATION: retired  PLOF: Independent with basic ADLs, Independent with household mobility with device, and Requires assistive device for independence  PATIENT GOALS: decrease pain, get Lt UE to work, decrease numbness  NEXT MD VISIT: none on file  OBJECTIVE:  Note: Objective measures were completed at Evaluation unless otherwise noted.  DIAGNOSTIC FINDINGS:  None on file since surgery  PATIENT SURVEYS:  NDI 26/50 - 52%  COGNITION: Overall cognitive status: History of cognitive impairments - at baseline  SENSATION: Light touch: Impaired   POSTURE:  forward head  PALPATION: Increased mm spasticity Rt > Lt cervical paraspinals Increased tenderness to light touch throughout cervical musculature, UT bilat Some decreased symptoms with manual traction   CERVICAL ROM:  Active ROM A/PROM (deg) eval  Flexion 34  Extension 14 pain  Right lateral flexion 35  Left lateral flexion 25  Right rotation 38  Left rotation 40   (Blank rows = not tested)  UPPER EXTREMITY MMT:  MMT Right eval Left eval Right 07/14/23 Left 07/14/23  Shoulder flexion 4 3- 4 4-  Shoulder extension      Shoulder abduction 4 3- 4 4-  Shoulder adduction      Shoulder extension      Shoulder internal rotation      Shoulder external rotation      Elbow flexion 4+ 3+ 4+ 4-  Elbow extension 4+ 3 4+ 4+  Wrist flexion      Wrist extension      Wrist ulnar deviation      Wrist radial deviation      Wrist pronation      GRIP STRENGTH 50 19 60 53   (Blank rows = not tested)  UPPER EXTREMITY ROM:  Active ROM Right eval Left eval  Shoulder flexion WFL 60  Shoulder extension    Shoulder abduction WFL 85 pain  Shoulder adduction    Shoulder extension    Shoulder internal rotation    Shoulder external rotation    Elbow flexion    Elbow extension    Wrist flexion    Wrist extension    Wrist ulnar deviation    Wrist radial deviation    Wrist pronation    Wrist supination     (Blank rows = not tested)  CERVICAL SPECIAL TESTS:  Spurling's test: Positive   [facility] Adult PT Treatment:                                                DATE: 07/23/2023 Therapeutic Exercise: Seated with 1/2 foam roller behind back: Bent arm chest lift + pec stretch 10x5 Seated thoracic extension stretch with noodle horiz at back Neuromuscular re-ed: Seated cervical isometrics with therapist assist Standing back against wall + noodle vertical at back: Cervical retraction into folded pillow Isometric chin tuck into folded pillow + arm raises holding:  1#DB raises in  flexion 1#DB bicep curls  1#DB raises in scaption    [facility] Adult PT Treatment:                                                DATE: 07/21/23 Therapeutic Exercise: Seated with noodle Scap squeeze with chest lift Backward shoulder circles Cervical AROM all directions T arm stretch Bent arm shoulder abduction with 1# DB  Neuromuscular re-ed: Cervical isometrics with therapist assist Standing Cervical retraction into folded pillow Cervical retraction into pillow with bilat shoulder scaption --> unable to tolerate with 1#DB --> performed without weight Seated with head of table elevated Shoulder flexion 1# DB Punches 1# DB Bent arm shoulder abd 1# T stretch Y stretch Therapeutic Activity: Seated Front arm raises 1# DB x 10 with cues to prevent compensation Bicep curls 3 ways 1# DB all 2 x 10   [facility] Adult PT Treatment:  DATE: 07/16/2023 Therapeutic Exercise: Seated with noodle: Scap squeeze + chest lift Bkwd shoulder circles Bent arm shoulder abd --> added 1#DB Cervical AROM all directions Thoracic extension + t arm stretch Neuromuscular re-ed: Seated: Cervical isometrics with therapist assist Standing back against wall: Cervical retraction into folded pillow Isometric chin tuck into folded pillow + arm raises holding 1#DB in flexion & scaption Supine, HOT inclined + towel roll behind neck: Shoulder flexion + 1#dowel Arm circles CW/CCW + 1#DB Punches + 1#DB Hug + elbow lift <--> T arm stretch Therapeutic Activity: Seated: Front arm raises holding 1#DB + cue to track gaze on dumbbell 3-way bicep curls x10 each position + 2#DB --> 1#DB Lt on reverse grip curl   PATIENT EDUCATION:  Education details: Updated HEP Person educated: Patient Education method: Explanation, Demonstration, and Handouts Education comprehension: verbalized understanding and returned demonstration  HOME EXERCISE PROGRAM: Access Code: [email or web address] URL:  [email or web address] Date: 07/02/2023 Prepared by: Sims Duck  Exercises - Seated Scapular Retraction  - 1 x daily - 7 x weekly - 2 sets - 10 reps - Supine Cervical Retraction with Towel  - 1 x daily - 7 x weekly - 3 sets - 10 reps - Supine Cervical Rotation AROM on Pillow  - 1 x daily - 7 x weekly - 3 sets - 10 reps - Seated Shoulder Row with Anchored Resistance  - 1 x daily - 7 x weekly - 3 sets - 10 reps - Dead Bug  - 1 x daily - 7 x weekly - 3 sets - 10 reps - Shoulder Flexion Wall Slide with Towel  - 1 x daily - 7 x weekly - 3 sets - 10 reps - Standing Isometric Cervical Sidebending with Manual Resistance  - 2 x daily - 7 x weekly - 2 sets - 10 reps - 5 sec hold - Standing Isometric Cervical Flexion with Manual Resistance  - 2 x daily - 7 x weekly - 2 sets - 10 reps - 5 sec hold - Standing Isometric Cervical Extension with Manual Resistance  - 2 x daily - 7 x weekly - 2 sets - 10 reps - 5 sec hold - Standing Isometric Shoulder Abduction with Doorway  - 2 x daily - 7 x weekly - 2 sets - 10 reps - 5 sec hold - Standing Isometric Shoulder Abduction with Doorway - Arm Bent  - 2 x daily - 7 x weekly - 2 sets - 10 reps - 5 sec hold - Seated Shoulder Horizontal Abduction with Resistance  - 1 x daily - 7 x weekly - 3 sets - 10 reps - Seated Single Arm Shoulder External Rotation with Self-Anchored Resistance  - 1 x daily - 7 x weekly - 3 sets - 10 reps  ASSESSMENT:  CLINICAL IMPRESSION: Exercises in standing continued with arm raise variations, cueing repetitions to fatigue; seated rest breaks taken as needed due to weakness in bilateral legs. Encouraged patient to call surgeon's office to follow up on MRI results and schedule follow-up appt with surgeon.    OBJECTIVE IMPAIRMENTS: decreased activity tolerance, decreased mobility, decreased ROM, decreased strength, increased muscle spasms, impaired sensation, impaired UE functional use, postural dysfunction, and pain.      GOALS: Goals reviewed with patient? Yes  SHORT TERM GOALS: Target date: 07/01/2023  Pt will be independent in initial HEP Baseline:  Goal status: MET  2.  Pt will improve Lt UE AROM to 90 degrees flexion and 100 degrees abduction Baseline: see above 06/30/23: flexion 108  deg, abd 76 deg Goal status: IN PROGRESS   LONG TERM GOALS: Target date: 07/29/2023  Pt will be independent with advanced HEP Baseline:  Goal status: MET  2.  NDI will improve to <= 16/50 to demo improved functional mobility Baseline: 26/50 07/14/23: 22/50 = 44.0% Goal status: IN PROGRESS  3.  Pt will improve Lt UE strength to 4/5 to improve functional activity tolerance Baseline: see above 07/14/23: see above Goal status: IN PROGRESS  4.  Pt will report 50% reduction in numbness in bilat UEs Baseline:  07/07/23: no change 07/14/23: no change Goal status: NOT MET    PLAN:  PT FREQUENCY: 2x/week  PT DURATION: 8 weeks  PLANNED INTERVENTIONS: 97164- PT Re-evaluation, 97110-Therapeutic exercises, 97530- Therapeutic activity, 97112- Neuromuscular re-education, 97535- Self Care, 16109- Manual therapy, [id number]- Aquatic Therapy, [id number]- Electrical stimulation (unattended), Patient/Family education, Taping, Dry Needling, Cryotherapy, and Moist heat  PLAN FOR NEXT SESSION: PT on hold or discharge? Standing cervical retraction isometrics, adding functional arm raises. Cervical isometrics, cervical ROM and strength. Progress standing exercises as tolerated.   Flint Hummer, PTA 07/23/2023, 12:30 PM

## 2023-07-28 ENCOUNTER — Encounter: Payer: Self-pay | Admitting: Physical Therapy

## 2023-07-28 ENCOUNTER — Ambulatory Visit: Admitting: Physical Therapy

## 2023-07-28 ENCOUNTER — Telehealth: Payer: Self-pay | Admitting: Family Medicine

## 2023-07-28 DIAGNOSIS — M6281 Muscle weakness (generalized): Secondary | ICD-10-CM

## 2023-07-28 DIAGNOSIS — R29898 Other symptoms and signs involving the musculoskeletal system: Secondary | ICD-10-CM

## 2023-07-28 NOTE — Therapy (Signed)
 OUTPATIENT PHYSICAL THERAPY CERVICAL TREATMENT AND RECERTIFICATION    Patient Name: Damon Acosta MRN: [id number] DOB:03/17/1940, 83 y.o., male Today's Date: 07/28/2023  END OF SESSION:  PT End of Session - 07/28/23 1140     Visit Number 14    Number of Visits 26    Date for PT Re-Evaluation 09/08/23    Authorization Type Medicare    Authorization - Visit Number 14    Authorization - Number of Visits 26    Progress Note Due on Visit 20    PT Start Time 1100    PT Stop Time 1141    PT Time Calculation (min) 41 min    Activity Tolerance Patient tolerated treatment well    Behavior During Therapy [facility] for tasks assessed/performed          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   CKD (chronic kidney disease)    Complication of anesthesia    patient had dificulty waking up   Coronary artery disease    Diabetes mellitus without complication ([name])    Gout    Hyperlipidemia    Hypertension    Myocardial infarction ([name])    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   colonic pseudo-obstruction, s/p colectomy with ileostomy 12/2013   Overactive bladder    Sleep apnea    Stroke [facility])    Past Surgical History:  Procedure Laterality Date   CHOLECYSTECTOMY     COLOSTOMY     POSTERIOR CERVICAL LAMINECTOMY N/A 02/09/2023   Procedure: Laminectomy  Cervical two - Cervical seven;  Surgeon: Agustina Aldrich, MD;  Location: [facility] OR;  Service: Neurosurgery;  Laterality: N/A;   PROSTATE SURGERY     Patient Active Problem List   Diagnosis Date Noted   Cervical spondylosis with myelopathy and radiculopathy 02/09/2023   Atopic dermatitis 10/28/2022   Throat clearing 09/16/2022   Dyspnea 07/07/2022   Urinary retention 06/04/2022   Cognitive change 06/04/2022    Insomnia 03/17/2022   Callus of foot, right heel 12/11/2021   Cervical cord myelomalacia ([name]) 10/30/2021   AV block, 1st degree 08/14/2021   Sinus bradycardia on ECG 08/14/2021   OSA (obstructive sleep apnea) 08/01/2021   History of MI (myocardial infarction) 10/21/2019   Diversion colitis 10/07/2019   Gastroesophageal reflux disease without esophagitis 10/07/2019   PAF (paroxysmal atrial fibrillation) ([name]) 09/16/2019   Atrial flutter ([name]) 09/15/2019   NSTEMI (non-ST elevated myocardial infarction) ([name]) 09/15/2019   Recurrent major depressive disorder, in partial remission ([name]) 09/15/2019   Coronary artery disease of native artery of native heart with stable angina pectoris ([name]) 09/15/2019   Severe episode of recurrent major depressive disorder, without psychotic features ([name]) 09/15/2019   Asymptomatic PVCs 07/27/2018   CKD (chronic kidney disease) stage 3, GFR 30-59 ml/min ([name]) 04/27/2018   Mild cognitive impairment 04/20/2018   Microcytic anemia 04/20/2018   Primary osteoarthritis of both knees 02/17/2018   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   Type 2 diabetes mellitus with chronic kidney disease, without long-term current use of insulin  ([name]) 01/06/2018   Dyslipidemia associated with type 2 diabetes mellitus ([name]) 01/06/2018   OAB (overactive bladder) 01/06/2018   History of prostate cancer 01/06/2018   Lumbar spinal stenosis 12/15/2017   Colostomy status ([name]) 12/15/2017  Hypertension associated with diabetes ([name]) 12/15/2017   Gout 12/15/2017   History of CVA (cerebrovascular accident) 12/15/2017   Ileostomy in place [facility]) 12/15/2017    PCP: Vincenza Greener PROVIDER: Agustina Aldrich  REFERRING DIAG: cervical spinal stenosis with myelopathy  THERAPY DIAG:  Muscle weakness (generalized)  Other symptoms and signs involving the musculoskeletal system  Rationale for Evaluation and Treatment: Rehabilitation  ONSET DATE: 02/09/24  SUBJECTIVE:                                                                                                                                                                                                          SUBJECTIVE STATEMENT: Patient says he has follow up with surgeon at the end of the month. He still has difficulty lifting his head in standing. He feels his legs are getting weaker. Sometimes he feels his legs will give out   PERTINENT HISTORY:  Lumbar spinal stenosis CVA CKD NSTEMI  Pt is s/p cervical laminectomy C2-C7 on 02/09/24. Since the surgery he continues with pain in his neck and with Lt UE weakness and bilat UE numbness/tingling. Prior to surgery he used a PFRW for ambulation but he has had a few recent falls and is now using a power w/c for mobility in and out of the house. Pt reports difficulty using Lt UE for functional activities. He reports he drops items often due to numbness in hands. Numbness does not get better or worse, it is just always there.  Hand dominance: Right  PAIN:  Are you having pain? Yes: NPRS scale: 6/10 currently, 8/10 at worst Pain location: neck, LT UE Pain description: sore, ache Aggravating factors: increased use of Lt UE Relieving factors: meds  PRECAUTIONS: Fall  RED FLAGS: None     WEIGHT BEARING RESTRICTIONS: No  FALLS:  Has patient fallen in last 6 months? Yes. Number of falls 2-3  LIVING ENVIRONMENT: Lives with: lives with their family Lives in: House/apartment Stairs: No Has following equipment at home: Environmental consultant - 2 wheeled and Wheelchair (power)  OCCUPATION: retired  PLOF: Independent with basic ADLs, Independent with household mobility with device, and Requires assistive device for independence  PATIENT GOALS: decrease pain, get Lt UE to work, decrease numbness  NEXT MD VISIT: none on file  OBJECTIVE:  Note: Objective measures were completed at Evaluation unless otherwise noted.  DIAGNOSTIC FINDINGS:  None on file since surgery  PATIENT SURVEYS:   NDI 26/50 - 52%  COGNITION: Overall cognitive status: History of cognitive impairments - at baseline  SENSATION: Light touch: Impaired  POSTURE: forward head  PALPATION: Increased mm spasticity Rt > Lt cervical paraspinals Increased tenderness to light touch throughout cervical musculature, UT bilat Some decreased symptoms with manual traction   CERVICAL ROM:   Active ROM A/PROM (deg) eval AROM 07/28/23  Flexion 34 30  Extension 14 pain 10  Right lateral flexion 35 28  Left lateral flexion 25 33  Right rotation 38 55  Left rotation 40 57   (Blank rows = not tested)  UPPER EXTREMITY MMT:  MMT Right eval Left eval Right 07/14/23 Left 07/14/23 Rt/Lt 07/28/23  Shoulder flexion 4 3- 4 4- 4-/4-  Shoulder extension       Shoulder abduction 4 3- 4 4- 4/4-  Shoulder adduction       Shoulder extension       Shoulder internal rotation       Shoulder external rotation       Elbow flexion 4+ 3+ 4+ 4- 4+/4-  Elbow extension 4+ 3 4+ 4+ 4+/4-  Wrist flexion       Wrist extension       Wrist ulnar deviation       Wrist radial deviation       Wrist pronation       GRIP STRENGTH 50 19 60 53    (Blank rows = not tested)  UPPER EXTREMITY ROM:  Active ROM Right eval Left eval  Shoulder flexion WFL 60  Shoulder extension    Shoulder abduction WFL 85 pain  Shoulder adduction    Shoulder extension    Shoulder internal rotation    Shoulder external rotation    Elbow flexion    Elbow extension    Wrist flexion    Wrist extension    Wrist ulnar deviation    Wrist radial deviation    Wrist pronation    Wrist supination     (Blank rows = not tested)  LOWER EXTREMITY MMT:    MMT Right eval Left eval  Hip flexion 4- 4-  Hip extension    Hip abduction    Hip adduction    Hip internal rotation    Hip external rotation    Knee flexion 4 4  Knee extension 4- 4-  Ankle dorsiflexion 4- 4-  Ankle plantarflexion    Ankle inversion    Ankle eversion     (Blank rows = not  tested)  CERVICAL SPECIAL TESTS:  Spurling's test: Positive  5 x STS with UE support = 18.15 seconds (07/28/23) TUG (no AD): 21.84 seconds (07/28/23) [facility] Adult PT Treatment:                                                DATE: 07/28/23 Therapeutic Exercise/Activity/NMR: 5 x STS with UE support = 18.15 seconds LE MMT - see above TUG: 21.84 seconds Seated with noodle Pec stretch with chest lift Backward shoulder rolls Thoracic extension over horizontal noodle Standing back against wall + noodle vertical at back + folded pillow behind head: Cervical retraction into folded pillow Cervical rotation Backward shoulder rolls Bear hug swimmers Isometric chin tuck into folded pillow + arm raises holding:  1#DB raises in flexion 1#DB bicep curls  1#DB raises in scaption   [facility] Adult PT Treatment:  DATE: 07/23/2023 Therapeutic Exercise: Seated with 1/2 foam roller behind back: Bent arm chest lift + pec stretch 10x5 Seated thoracic extension stretch with noodle horiz at back Neuromuscular re-ed: Seated cervical isometrics with therapist assist Standing back against wall + noodle vertical at back: Cervical retraction into folded pillow Isometric chin tuck into folded pillow + arm raises holding:  1#DB raises in flexion 1#DB bicep curls  1#DB raises in scaption    [facility] Adult PT Treatment:                                                DATE: 07/21/23 Therapeutic Exercise: Seated with noodle Scap squeeze with chest lift Backward shoulder circles Cervical AROM all directions T arm stretch Bent arm shoulder abduction with 1# DB  Neuromuscular re-ed: Cervical isometrics with therapist assist Standing Cervical retraction into folded pillow Cervical retraction into pillow with bilat shoulder scaption --> unable to tolerate with 1#DB --> performed without weight Seated with head of table elevated Shoulder flexion 1# DB Punches 1# DB Bent  arm shoulder abd 1# T stretch Y stretch Therapeutic Activity: Seated Front arm raises 1# DB x 10 with cues to prevent compensation Bicep curls 3 ways 1# DB all 2 x 10   [facility] Adult PT Treatment:                                                DATE: 07/16/2023 Therapeutic Exercise: Seated with noodle: Scap squeeze + chest lift Bkwd shoulder circles Bent arm shoulder abd --> added 1#DB Cervical AROM all directions Thoracic extension + t arm stretch Neuromuscular re-ed: Seated: Cervical isometrics with therapist assist Standing back against wall: Cervical retraction into folded pillow Isometric chin tuck into folded pillow + arm raises holding 1#DB in flexion & scaption Supine, HOT inclined + towel roll behind neck: Shoulder flexion + 1#dowel Arm circles CW/CCW + 1#DB Punches + 1#DB Hug + elbow lift <--> T arm stretch Therapeutic Activity: Seated: Front arm raises holding 1#DB + cue to track gaze on dumbbell 3-way bicep curls x10 each position + 2#DB --> 1#DB Lt on reverse grip curl   PATIENT EDUCATION:  Education details: Updated HEP Person educated: Patient Education method: Explanation, Demonstration, and Handouts Education comprehension: verbalized understanding and returned demonstration  HOME EXERCISE PROGRAM: Access Code: [email or web address] URL: [email or web address] Date: 07/28/2023 Prepared by: Lowery Rue  Exercises - Seated Scapular Retraction  - 1 x daily - 7 x weekly - 2 sets - 10 reps - Supine Cervical Retraction with Towel  - 1 x daily - 7 x weekly - 3 sets - 10 reps - Supine Cervical Rotation AROM on Pillow  - 1 x daily - 7 x weekly - 3 sets - 10 reps - Seated Shoulder Row with Anchored Resistance  - 1 x daily - 7 x weekly - 3 sets - 10 reps - Dead Bug  - 1 x daily - 7 x weekly - 3 sets - 10 reps - Shoulder Flexion Wall Slide with Towel  - 1 x daily - 7 x weekly - 3 sets - 10 reps - Standing Isometric Cervical Sidebending with Manual Resistance  -  2 x daily - 7 x weekly - 2 sets - 10  reps - 5 sec hold - Standing Isometric Cervical Flexion with Manual Resistance  - 2 x daily - 7 x weekly - 2 sets - 10 reps - 5 sec hold - Standing Isometric Cervical Extension with Manual Resistance  - 2 x daily - 7 x weekly - 2 sets - 10 reps - 5 sec hold - Standing Isometric Shoulder Abduction with Doorway  - 2 x daily - 7 x weekly - 2 sets - 10 reps - 5 sec hold - Standing Isometric Shoulder Abduction with Doorway - Arm Bent  - 2 x daily - 7 x weekly - 2 sets - 10 reps - 5 sec hold - Seated Shoulder Horizontal Abduction with Resistance  - 1 x daily - 7 x weekly - 3 sets - 10 reps - Seated Single Arm Shoulder External Rotation with Self-Anchored Resistance  - 1 x daily - 7 x weekly - 3 sets - 10 reps - Sit to Stand with Armchair  - 1 x daily - 7 x weekly - 2 sets - 10 reps - Heel Toe Raises with Counter Support  - 1 x daily - 7 x weekly - 3 sets - 10 reps - Seated Hip Abduction with Resistance  - 1 x daily - 7 x weekly - 3 sets - 10 reps  ASSESSMENT:  CLINICAL IMPRESSION: Pt is making progress with postural strength and endurance. He has improved cervical ROM. He continues with difficulty holding his head up in standing due to posterior cervical and thoracic muscle weakness. He has new complaint of LE strength and was assessed for that today. He demos decreased LE strength, gait and balance and will continue to benefit from skilled PT to address these deficits as well as ongoing UE weakness and decreased cervical strength and ROM   OBJECTIVE IMPAIRMENTS: decreased activity tolerance, decreased mobility, decreased ROM, decreased strength, increased muscle spasms, impaired sensation, impaired UE functional use, postural dysfunction, and pain.     GOALS: Goals reviewed with patient? Yes  SHORT TERM GOALS: Target date: 07/01/2023  Pt will be independent in initial HEP Baseline:  Goal status: MET  2.  Pt will improve Lt UE AROM to 90 degrees flexion and  100 degrees abduction Baseline: see above 06/30/23: flexion 108 deg, abd 76 deg Goal status: IN PROGRESS   LONG TERM GOALS: Target date: 09/08/2023    Pt will be independent with advanced HEP Baseline:  Goal status: MET  2.  NDI will improve to <= 16/50 to demo improved functional mobility Baseline: 26/50 07/14/23: 22/50 = 44.0% Goal status: IN PROGRESS  3.  Pt will improve Lt UE strength to 4/5 to improve functional activity tolerance Baseline: see above 07/14/23: see above Goal status: IN PROGRESS  4.  Pt will report 50% reduction in numbness in bilat UEs Baseline:  07/07/23: no change 07/14/23: no change 07/28/23: 10% better Goal status: NOT MET 5.  Pt will demo improved LE strength with 5 x STS with UE support <= 15 seconds Baseline:  Goal status: INITIAL 6.  Pt will improve TUG to <= 16 seconds to demo improved gait and balance Baseline:  Goal status: INITIAL      PLAN:  PT FREQUENCY: 2x/week  PT DURATION: 6 weeks  PLANNED INTERVENTIONS: [id number]- PT Re-evaluation, 97110-Therapeutic exercises, 97530- Therapeutic activity, [id number]- Neuromuscular re-education, 97535- Self Care, [id number]- Manual therapy, [id number]- Aquatic Therapy, [id number]- Electrical stimulation (unattended), Patient/Family education, Taping, Dry Needling, Cryotherapy, and Moist heat  PLAN FOR NEXT SESSION: Continue to progress standing  exercises. Standing LE strength and balance, postural strength   Jeniah Kishi, PT 07/28/2023, 11:41 AM

## 2023-07-28 NOTE — Telephone Encounter (Signed)
 Pt last seen in March. Pt does not have a f/u currently scheduled. Routing this to Dr. Augustus Ledger for him to review since pt is needing all meds refilled.

## 2023-07-28 NOTE — Telephone Encounter (Signed)
 Pt's wife stopped by to request refills on all his medications. Pharmacy on file is correct.

## 2023-07-30 ENCOUNTER — Ambulatory Visit

## 2023-07-30 ENCOUNTER — Other Ambulatory Visit: Payer: Self-pay

## 2023-07-30 DIAGNOSIS — F332 Major depressive disorder, recurrent severe without psychotic features: Secondary | ICD-10-CM

## 2023-07-30 DIAGNOSIS — L2089 Other atopic dermatitis: Secondary | ICD-10-CM

## 2023-07-30 MED ORDER — ESCITALOPRAM OXALATE 10 MG PO TABS: 10.0000 mg | ORAL_TABLET | Freq: Every day | ORAL | 0 refills | Status: DC

## 2023-07-30 MED ORDER — TRIAMCINOLONE ACETONIDE 0.1 % EX CREA
TOPICAL_CREAM | Freq: Two times a day (BID) | CUTANEOUS | 0 refills | Status: AC
Start: 2023-07-30 — End: ?

## 2023-07-30 MED ORDER — METHOCARBAMOL 500 MG PO TABS: 500.0000 mg | ORAL_TABLET | Freq: Three times a day (TID) | ORAL | 0 refills | Status: DC

## 2023-07-30 MED ORDER — OXYBUTYNIN CHLORIDE ER 15 MG PO TB24: 15.0000 mg | ORAL_TABLET | Freq: Every day | ORAL | 0 refills | Status: DC

## 2023-07-30 NOTE — Telephone Encounter (Signed)
 The rx refill requests has been handled. Please contact the patient to schedule his follow up in September with the provider. Thanks in advance.

## 2023-08-04 ENCOUNTER — Ambulatory Visit

## 2023-08-04 DIAGNOSIS — R29898 Other symptoms and signs involving the musculoskeletal system: Secondary | ICD-10-CM | POA: Diagnosis not present

## 2023-08-04 DIAGNOSIS — M6281 Muscle weakness (generalized): Secondary | ICD-10-CM | POA: Diagnosis not present

## 2023-08-04 NOTE — Therapy (Signed)
 OUTPATIENT PHYSICAL THERAPY CERVICAL TREATMENT    Patient Name: Damon Acosta MRN: [id number] DOB:06/25/1940, 83 y.o., male Today's Date: 08/04/2023  END OF SESSION:  PT End of Session - 08/04/23 1102     Visit Number 15    Number of Visits 26    Date for PT Re-Evaluation 09/08/23    Authorization Type Medicare    Authorization - Visit Number 15    Authorization - Number of Visits 26    Progress Note Due on Visit 20    PT Start Time 1105    PT Stop Time 1143    PT Time Calculation (min) 38 min    Activity Tolerance Patient tolerated treatment well    Behavior During Therapy WFL for tasks assessed/performed         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   CKD (chronic kidney disease)    Complication of anesthesia    patient had dificulty waking up   Coronary artery disease    Diabetes mellitus without complication ([name])    Gout    Hyperlipidemia    Hypertension    Myocardial infarction ([name])    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   colonic pseudo-obstruction, s/p colectomy with ileostomy 12/2013   Overactive bladder    Sleep apnea    Stroke [facility])    Past Surgical History:  Procedure Laterality Date   CHOLECYSTECTOMY     COLOSTOMY     POSTERIOR CERVICAL LAMINECTOMY N/A 02/09/2023   Procedure: Laminectomy  Cervical two - Cervical seven;  Surgeon: Louis Shove, MD;  Location: [facility] OR;  Service: Neurosurgery;  Laterality: N/A;   PROSTATE SURGERY     Patient Active Problem List   Diagnosis Date Noted   Cervical spondylosis with myelopathy and radiculopathy 02/09/2023   Atopic dermatitis 10/28/2022   Throat clearing 09/16/2022   Dyspnea 07/07/2022   Urinary retention 06/04/2022   Cognitive change 06/04/2022   Insomnia 03/17/2022    Callus of foot, right heel 12/11/2021   Cervical cord myelomalacia ([name]) 10/30/2021   AV block, 1st degree 08/14/2021   Sinus bradycardia on ECG 08/14/2021   OSA (obstructive sleep apnea) 08/01/2021   History of MI (myocardial infarction) 10/21/2019   Diversion colitis 10/07/2019   Gastroesophageal reflux disease without esophagitis 10/07/2019   PAF (paroxysmal atrial fibrillation) ([name]) 09/16/2019   Atrial flutter ([name]) 09/15/2019   NSTEMI (non-ST elevated myocardial infarction) ([name]) 09/15/2019   Recurrent major depressive disorder, in partial remission ([name]) 09/15/2019   Coronary artery disease of native artery of native heart with stable angina pectoris ([name]) 09/15/2019   Severe episode of recurrent major depressive disorder, without psychotic features ([name]) 09/15/2019   Asymptomatic PVCs 07/27/2018   CKD (chronic kidney disease) stage 3, GFR 30-59 ml/min ([name]) 04/27/2018   Mild cognitive impairment 04/20/2018   Microcytic anemia 04/20/2018   Primary osteoarthritis of both knees 02/17/2018   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   Type 2 diabetes mellitus with chronic kidney disease, without long-term current use of insulin  ([name]) 01/06/2018   Dyslipidemia associated with type 2 diabetes mellitus ([name]) 01/06/2018   OAB (overactive bladder) 01/06/2018   History of prostate cancer 01/06/2018   Lumbar spinal stenosis 12/15/2017   Colostomy status ([name]) 12/15/2017   Hypertension associated with  diabetes ([name]) 12/15/2017   Gout 12/15/2017   History of CVA (cerebrovascular accident) 12/15/2017   Ileostomy in place [facility]) 12/15/2017    PCP: Alvia MART PROVIDER: Victory Gunnels  REFERRING DIAG: cervical spinal stenosis with myelopathy  THERAPY DIAG:  Other symptoms and signs involving the musculoskeletal system  Rationale for Evaluation and Treatment: Rehabilitation  ONSET DATE: 02/09/24  SUBJECTIVE:                                                                                                                                                                                                          SUBJECTIVE STATEMENT: Patient reports his legs continue to feel like they are going to give out and feel weak when walking.    PERTINENT HISTORY:  Lumbar spinal stenosis CVA CKD NSTEMI  Pt is s/p cervical laminectomy C2-C7 on 02/09/24. Since the surgery he continues with pain in his neck and with Lt UE weakness and bilat UE numbness/tingling. Prior to surgery he used a PFRW for ambulation but he has had a few recent falls and is now using a power w/c for mobility in and out of the house. Pt reports difficulty using Lt UE for functional activities. He reports he drops items often due to numbness in hands. Numbness does not get better or worse, it is just always there.  Hand dominance: Right  PAIN:  Are you having pain? Yes: NPRS scale: 6/10 currently, 8/10 at worst Pain location: neck, LT UE Pain description: sore, ache Aggravating factors: increased use of Lt UE Relieving factors: meds  PRECAUTIONS: Fall  RED FLAGS: None     WEIGHT BEARING RESTRICTIONS: No  FALLS:  Has patient fallen in last 6 months? Yes. Number of falls 2-3  LIVING ENVIRONMENT: Lives with: lives with their family Lives in: House/apartment Stairs: No Has following equipment at home: Environmental consultant - 2 wheeled and Wheelchair (power)  OCCUPATION: retired  PLOF: Independent with basic ADLs, Independent with household mobility with device, and Requires assistive device for independence  PATIENT GOALS: decrease pain, get Lt UE to work, decrease numbness  NEXT MD VISIT: July Burtis)  OBJECTIVE:  Note: Objective measures were completed at Evaluation unless otherwise noted.  DIAGNOSTIC FINDINGS:  None on file since surgery  PATIENT SURVEYS:  NDI 26/50 - 52%  COGNITION: Overall cognitive status: History of cognitive impairments - at baseline  SENSATION: Light touch: Impaired   POSTURE:  forward head  PALPATION: Increased mm spasticity Rt > Lt cervical paraspinals Increased tenderness to light touch throughout cervical musculature, UT bilat Some decreased symptoms  with manual traction   CERVICAL ROM:   Active ROM A/PROM (deg) eval AROM 07/28/23  Flexion 34 30  Extension 14 pain 10  Right lateral flexion 35 28  Left lateral flexion 25 33  Right rotation 38 55  Left rotation 40 57   (Blank rows = not tested)  UPPER EXTREMITY MMT:  MMT Right eval Left eval Right 07/14/23 Left 07/14/23 Rt/Lt 07/28/23  Shoulder flexion 4 3- 4 4- 4-/4-  Shoulder extension       Shoulder abduction 4 3- 4 4- 4/4-  Shoulder adduction       Shoulder extension       Shoulder internal rotation       Shoulder external rotation       Elbow flexion 4+ 3+ 4+ 4- 4+/4-  Elbow extension 4+ 3 4+ 4+ 4+/4-  Wrist flexion       Wrist extension       Wrist ulnar deviation       Wrist radial deviation       Wrist pronation       GRIP STRENGTH 50 19 60 53    (Blank rows = not tested)  UPPER EXTREMITY ROM:  Active ROM Right eval Left eval  Shoulder flexion WFL 60  Shoulder extension    Shoulder abduction WFL 85 pain  Shoulder adduction    Shoulder extension    Shoulder internal rotation    Shoulder external rotation    Elbow flexion    Elbow extension    Wrist flexion    Wrist extension    Wrist ulnar deviation    Wrist radial deviation    Wrist pronation    Wrist supination     (Blank rows = not tested)  LOWER EXTREMITY MMT:    MMT Right eval Left eval  Hip flexion 4- 4-  Hip extension    Hip abduction    Hip adduction    Hip internal rotation    Hip external rotation    Knee flexion 4 4  Knee extension 4- 4-  Ankle dorsiflexion 4- 4-  Ankle plantarflexion    Ankle inversion    Ankle eversion     (Blank rows = not tested)  CERVICAL SPECIAL TESTS:  Spurling's test: Positive  5 x STS with UE support = 18.15 seconds (07/28/23) TUG (no AD): 21.84 seconds  (07/28/23)   [facility] Adult PT Treatment:                                                DATE: 08/04/2023 Therapeutic Exercise: Seated: Hip abd + GTB x10 LAQ + 3#AW x10 (bil) Marching + RTB around thighs Standing at counter:  Heel raises x10 Toe raises + isometric hold x10 Gastroc stretch Seated bkwd shoulder circles Neuromuscular re-ed: Seated: Cervical retraction + scap squeeze --> folded pillow behind head Cervical retraction + shoulder ER Therapeutic Activity: Sit to stand + UE push off from arm rests --> fatigues quickly Seated bicep curl + overhead press with 1#DB --> to fatigue on Lt UE Standing squats (at counter) + chest lift in standing x10   [facility] Adult PT Treatment:  DATE: 07/28/23 Therapeutic Exercise/Activity/NMR: 5 x STS with UE support = 18.15 seconds LE MMT - see above TUG: 21.84 seconds Seated with noodle Pec stretch with chest lift Backward shoulder rolls Thoracic extension over horizontal noodle Standing back against wall + noodle vertical at back + folded pillow behind head: Cervical retraction into folded pillow Cervical rotation Backward shoulder rolls Bear hug swimmers Isometric chin tuck into folded pillow + arm raises holding:  1#DB raises in flexion 1#DB bicep curls  1#DB raises in scaption   [facility] Adult PT Treatment:                                                DATE: 07/23/2023 Therapeutic Exercise: Seated with 1/2 foam roller behind back: Bent arm chest lift + pec stretch 10x5 Seated thoracic extension stretch with noodle horiz at back Neuromuscular re-ed: Seated cervical isometrics with therapist assist Standing back against wall + noodle vertical at back: Cervical retraction into folded pillow Isometric chin tuck into folded pillow + arm raises holding:  1#DB raises in flexion 1#DB bicep curls  1#DB raises in scaption   PATIENT EDUCATION:  Education details: Updated HEP Person educated:  Patient Education method: Explanation, Demonstration, and Handouts Education comprehension: verbalized understanding and returned demonstration  HOME EXERCISE PROGRAM: Access Code: [email or web address] URL: [email or web address] Date: 08/04/2023 Prepared by: Lamarr Price  Exercises - Seated Scapular Retraction  - 1 x daily - 7 x weekly - 2 sets - 10 reps - Supine Cervical Retraction with Towel  - 1 x daily - 7 x weekly - 3 sets - 10 reps - Supine Cervical Rotation AROM on Pillow  - 1 x daily - 7 x weekly - 3 sets - 10 reps - Seated Shoulder Row with Anchored Resistance  - 1 x daily - 7 x weekly - 3 sets - 10 reps - Dead Bug  - 1 x daily - 7 x weekly - 3 sets - 10 reps - Shoulder Flexion Wall Slide with Towel  - 1 x daily - 7 x weekly - 3 sets - 10 reps - Standing Isometric Cervical Sidebending with Manual Resistance  - 2 x daily - 7 x weekly - 2 sets - 10 reps - 5 sec hold - Standing Isometric Cervical Flexion with Manual Resistance  - 2 x daily - 7 x weekly - 2 sets - 10 reps - 5 sec hold - Standing Isometric Cervical Extension with Manual Resistance  - 2 x daily - 7 x weekly - 2 sets - 10 reps - 5 sec hold - Standing Isometric Shoulder Abduction with Doorway  - 2 x daily - 7 x weekly - 2 sets - 10 reps - 5 sec hold - Standing Isometric Shoulder Abduction with Doorway - Arm Bent  - 2 x daily - 7 x weekly - 2 sets - 10 reps - 5 sec hold - Seated Shoulder Horizontal Abduction with Resistance  - 1 x daily - 7 x weekly - 3 sets - 10 reps - Seated Single Arm Shoulder External Rotation with Self-Anchored Resistance  - 1 x daily - 7 x weekly - 3 sets - 10 reps - Sit to Stand with Armchair  - 1 x daily - 7 x weekly - 2 sets - 10 reps - Heel Toe Raises with Counter Support  - 1 x daily - 7 x weekly -  3 sets - 10 reps - Seated Hip Abduction with Resistance  - 1 x daily - 7 x weekly - 3 sets - 10 reps - Seated March with Resistance  - 1 x daily - 7 x weekly - 3 sets - 10 reps - Standing Gastroc  Stretch  - 2 x daily - 7 x weekly - 1 sets - 5-3 reps - 10-30 sec hold  ASSESSMENT:  CLINICAL IMPRESSION:  LE strengthening incorporated to progress functional endurance and stability. Exercises cued to fatigue over set repetitions to help patient monitor pace. Patient quick to fatigue in bilateral LE (L>R) during sit to stand.   OBJECTIVE IMPAIRMENTS: decreased activity tolerance, decreased mobility, decreased ROM, decreased strength, increased muscle spasms, impaired sensation, impaired UE functional use, postural dysfunction, and pain.     GOALS: Goals reviewed with patient? Yes  SHORT TERM GOALS: Target date: 07/01/2023  Pt will be independent in initial HEP Baseline:  Goal status: MET  2.  Pt will improve Lt UE AROM to 90 degrees flexion and 100 degrees abduction Baseline: see above 06/30/23: flexion 108 deg, abd 76 deg Goal status: IN PROGRESS   LONG TERM GOALS: Target date: 09/08/2023  Pt will be independent with advanced HEP Baseline:  Goal status: MET  2.  NDI will improve to <= 16/50 to demo improved functional mobility Baseline: 26/50 07/14/23: 22/50 = 44.0% Goal status: IN PROGRESS  3.  Pt will improve Lt UE strength to 4/5 to improve functional activity tolerance Baseline: see above 07/14/23: see above Goal status: IN PROGRESS  4.  Pt will report 50% reduction in numbness in bilat UEs Baseline:  07/07/23: no change 07/14/23: no change 07/28/23: 10% better Goal status: NOT MET  5.  Pt will demo improved LE strength with 5 x STS with UE support <= 15 seconds Baseline:  Goal status: INITIAL  6.  Pt will improve TUG to <= 16 seconds to demo improved gait and balance Baseline:  Goal status: INITIAL   PLAN:  PT FREQUENCY: 2x/week  PT DURATION: 6 weeks  PLANNED INTERVENTIONS: [id number]- PT Re-evaluation, 97110-Therapeutic exercises, 97530- Therapeutic activity, [id number]- Neuromuscular re-education, 97535- Self Care, [id number]- Manual therapy, [id number]- Aquatic Therapy,  [id number]- Electrical stimulation (unattended), Patient/Family education, Taping, Dry Needling, Cryotherapy, and Moist heat  PLAN FOR NEXT SESSION: Continue to progress standing exercises. Standing LE strength and balance, postural strength   Lamarr GORMAN Price, PTA 08/04/2023, 11:44 AM

## 2023-08-06 ENCOUNTER — Ambulatory Visit

## 2023-08-06 DIAGNOSIS — R29898 Other symptoms and signs involving the musculoskeletal system: Secondary | ICD-10-CM

## 2023-08-06 DIAGNOSIS — M6281 Muscle weakness (generalized): Secondary | ICD-10-CM | POA: Diagnosis not present

## 2023-08-06 NOTE — Therapy (Signed)
 OUTPATIENT PHYSICAL THERAPY CERVICAL TREATMENT    Patient Name: Damon Acosta MRN: [id number] DOB:23-Jul-1940, 83 y.o., male Today's Date: 08/06/2023  END OF SESSION:  PT End of Session - 08/06/23 1105     Visit Number 16    Number of Visits 26    Date for PT Re-Evaluation 09/08/23    Authorization Type Medicare    Authorization - Visit Number 16    Authorization - Number of Visits 26    Progress Note Due on Visit 20    PT Start Time 1105    PT Stop Time 1143    PT Time Calculation (min) 38 min    Activity Tolerance Patient tolerated treatment well    Behavior During Therapy WFL for tasks assessed/performed         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   CKD (chronic kidney disease)    Complication of anesthesia    patient had dificulty waking up   Coronary artery disease    Diabetes mellitus without complication ([name])    Gout    Hyperlipidemia    Hypertension    Myocardial infarction ([name])    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   colonic pseudo-obstruction, s/p colectomy with ileostomy 12/2013   Overactive bladder    Sleep apnea    Stroke [facility])    Past Surgical History:  Procedure Laterality Date   CHOLECYSTECTOMY     COLOSTOMY     POSTERIOR CERVICAL LAMINECTOMY N/A 02/09/2023   Procedure: Laminectomy  Cervical two - Cervical seven;  Surgeon: Louis Shove, MD;  Location: [facility] OR;  Service: Neurosurgery;  Laterality: N/A;   PROSTATE SURGERY     Patient Active Problem List   Diagnosis Date Noted   Cervical spondylosis with myelopathy and radiculopathy 02/09/2023   Atopic dermatitis 10/28/2022   Throat clearing 09/16/2022   Dyspnea 07/07/2022   Urinary retention 06/04/2022   Cognitive change 06/04/2022   Insomnia 03/17/2022    Callus of foot, right heel 12/11/2021   Cervical cord myelomalacia ([name]) 10/30/2021   AV block, 1st degree 08/14/2021   Sinus bradycardia on ECG 08/14/2021   OSA (obstructive sleep apnea) 08/01/2021   History of MI (myocardial infarction) 10/21/2019   Diversion colitis 10/07/2019   Gastroesophageal reflux disease without esophagitis 10/07/2019   PAF (paroxysmal atrial fibrillation) ([name]) 09/16/2019   Atrial flutter ([name]) 09/15/2019   NSTEMI (non-ST elevated myocardial infarction) ([name]) 09/15/2019   Recurrent major depressive disorder, in partial remission ([name]) 09/15/2019   Coronary artery disease of native artery of native heart with stable angina pectoris ([name]) 09/15/2019   Severe episode of recurrent major depressive disorder, without psychotic features ([name]) 09/15/2019   Asymptomatic PVCs 07/27/2018   CKD (chronic kidney disease) stage 3, GFR 30-59 ml/min ([name]) 04/27/2018   Mild cognitive impairment 04/20/2018   Microcytic anemia 04/20/2018   Primary osteoarthritis of both knees 02/17/2018   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   Type 2 diabetes mellitus with chronic kidney disease, without long-term current use of insulin  ([name]) 01/06/2018   Dyslipidemia associated with type 2 diabetes mellitus ([name]) 01/06/2018   OAB (overactive bladder) 01/06/2018   History of prostate cancer 01/06/2018   Lumbar spinal stenosis 12/15/2017   Colostomy status ([name]) 12/15/2017   Hypertension associated with  diabetes ([name]) 12/15/2017   Gout 12/15/2017   History of CVA (cerebrovascular accident) 12/15/2017   Ileostomy in place [facility]) 12/15/2017    PCP: Alvia MART PROVIDER: Victory Gunnels  REFERRING DIAG: cervical spinal stenosis with myelopathy  THERAPY DIAG:  Muscle weakness (generalized)  Other symptoms and signs involving the musculoskeletal system  Rationale for Evaluation and Treatment: Rehabilitation  ONSET DATE: 02/09/24  SUBJECTIVE:                                                                                                                                                                                                          SUBJECTIVE STATEMENT: Patient reports he felt good after last visit with minimal soreness.   PERTINENT HISTORY:  Lumbar spinal stenosis CVA CKD NSTEMI  Pt is s/p cervical laminectomy C2-C7 on 02/09/24. Since the surgery he continues with pain in his neck and with Lt UE weakness and bilat UE numbness/tingling. Prior to surgery he used a PFRW for ambulation but he has had a few recent falls and is now using a power w/c for mobility in and out of the house. Pt reports difficulty using Lt UE for functional activities. He reports he drops items often due to numbness in hands. Numbness does not get better or worse, it is just always there.  Hand dominance: Right  PAIN:  Are you having pain? Yes: NPRS scale: 6/10 currently, 8/10 at worst Pain location: neck, LT UE Pain description: sore, ache Aggravating factors: increased use of Lt UE Relieving factors: meds  PRECAUTIONS: Fall  RED FLAGS: None     WEIGHT BEARING RESTRICTIONS: No  FALLS:  Has patient fallen in last 6 months? Yes. Number of falls 2-3  LIVING ENVIRONMENT: Lives with: lives with their family Lives in: House/apartment Stairs: No Has following equipment at home: Environmental consultant - 2 wheeled and Wheelchair (power)  OCCUPATION: retired  PLOF: Independent with basic ADLs, Independent with household mobility with device, and Requires assistive device for independence  PATIENT GOALS: decrease pain, get Lt UE to work, decrease numbness  NEXT MD VISIT: July Burtis)  OBJECTIVE:  Note: Objective measures were completed at Evaluation unless otherwise noted.  DIAGNOSTIC FINDINGS:  None on file since surgery  PATIENT SURVEYS:  NDI 26/50 - 52%  COGNITION: Overall cognitive status: History of cognitive impairments - at baseline  SENSATION: Light touch: Impaired   POSTURE:  forward head  PALPATION: Increased mm spasticity Rt > Lt cervical paraspinals Increased tenderness to light touch throughout cervical musculature, UT bilat Some decreased symptoms with manual traction  CERVICAL ROM:   Active ROM A/PROM (deg) eval AROM 07/28/23  Flexion 34 30  Extension 14 pain 10  Right lateral flexion 35 28  Left lateral flexion 25 33  Right rotation 38 55  Left rotation 40 57   (Blank rows = not tested)  UPPER EXTREMITY MMT:  MMT Right eval Left eval Right 07/14/23 Left 07/14/23 Rt/Lt 07/28/23  Shoulder flexion 4 3- 4 4- 4-/4-  Shoulder extension       Shoulder abduction 4 3- 4 4- 4/4-  Shoulder adduction       Shoulder extension       Shoulder internal rotation       Shoulder external rotation       Elbow flexion 4+ 3+ 4+ 4- 4+/4-  Elbow extension 4+ 3 4+ 4+ 4+/4-  Wrist flexion       Wrist extension       Wrist ulnar deviation       Wrist radial deviation       Wrist pronation       GRIP STRENGTH 50 19 60 53    (Blank rows = not tested)  UPPER EXTREMITY ROM:  Active ROM Right eval Left eval  Shoulder flexion WFL 60  Shoulder extension    Shoulder abduction WFL 85 pain  Shoulder adduction    Shoulder extension    Shoulder internal rotation    Shoulder external rotation    Elbow flexion    Elbow extension    Wrist flexion    Wrist extension    Wrist ulnar deviation    Wrist radial deviation    Wrist pronation    Wrist supination     (Blank rows = not tested)  LOWER EXTREMITY MMT:    MMT Right eval Left eval  Hip flexion 4- 4-  Hip extension    Hip abduction    Hip adduction    Hip internal rotation    Hip external rotation    Knee flexion 4 4  Knee extension 4- 4-  Ankle dorsiflexion 4- 4-  Ankle plantarflexion    Ankle inversion    Ankle eversion     (Blank rows = not tested)  CERVICAL SPECIAL TESTS:  Spurling's test: Positive  5 x STS with UE support = 18.15 seconds (07/28/23) TUG (no AD): 21.84 seconds  (07/28/23)   [facility] Adult PT Treatment:                                                DATE: 08/06/2023 Therapeutic Exercise: Seated bkwd shoulder circles Cervical AROM all directions Standing at high table:  Heel raises x20 Toe raises x20 Gastroc stretch 3x20 (bil) Hip abd  Hip ext HS curls Neuromuscular re-ed: Seated: Cervical retraction + scap squeeze --> folded pillow behind head Cervical retraction + shoulder flexion with 1#DB Resisted knee extension + RTB Therapeutic Activity: STS + UE push-off on armrests + chest lift in standing x10 Standing mini squats + UE support Standing + 1#DB curl & overhead lift    [facility] Adult PT Treatment:                                                DATE: 08/04/2023 Therapeutic Exercise: Seated: Hip  abd + GTB x10 LAQ + 3#AW x10 (bil) Marching + RTB around thighs Standing at counter:  Heel raises x10 Toe raises + isometric hold x10 Gastroc stretch Seated bkwd shoulder circles Neuromuscular re-ed: Seated: Cervical retraction + scap squeeze --> folded pillow behind head Cervical retraction + shoulder ER Therapeutic Activity: Sit to stand + UE push off from arm rests --> fatigues quickly Seated bicep curl + overhead press with 1#DB --> to fatigue on Lt UE Standing squats (at counter) + chest lift in standing x10   [facility] Adult PT Treatment:                                                DATE: 07/28/23 Therapeutic Exercise/Activity/NMR: 5 x STS with UE support = 18.15 seconds LE MMT - see above TUG: 21.84 seconds Seated with noodle Pec stretch with chest lift Backward shoulder rolls Thoracic extension over horizontal noodle Standing back against wall + noodle vertical at back + folded pillow behind head: Cervical retraction into folded pillow Cervical rotation Backward shoulder rolls Bear hug swimmers Isometric chin tuck into folded pillow + arm raises holding:  1#DB raises in flexion 1#DB bicep curls  1#DB raises in  scaption   [facility] Adult PT Treatment:                                                DATE: 07/23/2023 Therapeutic Exercise: Seated with 1/2 foam roller behind back: Bent arm chest lift + pec stretch 10x5 Seated thoracic extension stretch with noodle horiz at back Neuromuscular re-ed: Seated cervical isometrics with therapist assist Standing back against wall + noodle vertical at back: Cervical retraction into folded pillow Isometric chin tuck into folded pillow + arm raises holding:  1#DB raises in flexion 1#DB bicep curls  1#DB raises in scaption   PATIENT EDUCATION:  Education details: Updated HEP Person educated: Patient Education method: Explanation, Demonstration, and Handouts Education comprehension: verbalized understanding and returned demonstration  HOME EXERCISE PROGRAM: Access Code: [email or web address] URL: [email or web address] Date: 08/06/2023 Prepared by: Lamarr Price  Exercises - Seated Scapular Retraction  - 1 x daily - 7 x weekly - 2 sets - 10 reps - Supine Cervical Retraction with Towel  - 1 x daily - 7 x weekly - 3 sets - 10 reps - Supine Cervical Rotation AROM on Pillow  - 1 x daily - 7 x weekly - 3 sets - 10 reps - Seated Shoulder Row with Anchored Resistance  - 1 x daily - 7 x weekly - 3 sets - 10 reps - Dead Bug  - 1 x daily - 7 x weekly - 3 sets - 10 reps - Shoulder Flexion Wall Slide with Towel  - 1 x daily - 7 x weekly - 3 sets - 10 reps - Standing Isometric Cervical Sidebending with Manual Resistance  - 2 x daily - 7 x weekly - 2 sets - 10 reps - 5 sec hold - Standing Isometric Cervical Flexion with Manual Resistance  - 2 x daily - 7 x weekly - 2 sets - 10 reps - 5 sec hold - Standing Isometric Cervical Extension with Manual Resistance  - 2 x daily - 7 x weekly - 2 sets - 10 reps -  5 sec hold - Standing Isometric Shoulder Abduction with Doorway  - 2 x daily - 7 x weekly - 2 sets - 10 reps - 5 sec hold - Standing Isometric Shoulder Abduction with  Doorway - Arm Bent  - 2 x daily - 7 x weekly - 2 sets - 10 reps - 5 sec hold - Seated Shoulder Horizontal Abduction with Resistance  - 1 x daily - 7 x weekly - 3 sets - 10 reps - Seated Single Arm Shoulder External Rotation with Self-Anchored Resistance  - 1 x daily - 7 x weekly - 3 sets - 10 reps - Sit to Stand with Armchair  - 1 x daily - 7 x weekly - 2 sets - 10 reps - Heel Toe Raises with Counter Support  - 1 x daily - 7 x weekly - 3 sets - 10 reps - Seated Hip Abduction with Resistance  - 1 x daily - 7 x weekly - 3 sets - 10 reps - Seated March with Resistance  - 1 x daily - 7 x weekly - 3 sets - 10 reps - Standing Gastroc Stretch  - 2 x daily - 7 x weekly - 1 sets - 5-3 reps - 10-30 sec hold - Standing Hip Abduction with Counter Support  - 1 x daily - 7 x weekly - 3 sets - 10 reps - Standing Hip Extension with Counter Support  - 1 x daily - 7 x weekly - 3 sets - 10 reps - Standing Hamstring Curl with Chair Support  - 1 x daily - 7 x weekly - 3 sets - 10 reps - Mini Squat with Counter Support  - 1 x daily - 7 x weekly - 3 sets - 10 reps  ASSESSMENT:  CLINICAL IMPRESSION:  Postural cues and strengthening incorporated with LE strengthening exercises in sitting and standing. Continued progression with standing exercises and progressing endurance/strength with holding head up in upright posture.   OBJECTIVE IMPAIRMENTS: decreased activity tolerance, decreased mobility, decreased ROM, decreased strength, increased muscle spasms, impaired sensation, impaired UE functional use, postural dysfunction, and pain.     GOALS: Goals reviewed with patient? Yes  SHORT TERM GOALS: Target date: 07/01/2023  Pt will be independent in initial HEP Baseline:  Goal status: MET  2.  Pt will improve Lt UE AROM to 90 degrees flexion and 100 degrees abduction Baseline: see above 06/30/23: flexion 108 deg, abd 76 deg Goal status: IN PROGRESS   LONG TERM GOALS: Target date: 09/08/2023  Pt will be  independent with advanced HEP Baseline:  Goal status: MET  2.  NDI will improve to <= 16/50 to demo improved functional mobility Baseline: 26/50 07/14/23: 22/50 = 44.0% Goal status: IN PROGRESS  3.  Pt will improve Lt UE strength to 4/5 to improve functional activity tolerance Baseline: see above 07/14/23: see above Goal status: IN PROGRESS  4.  Pt will report 50% reduction in numbness in bilat UEs Baseline:  07/07/23: no change 07/14/23: no change 07/28/23: 10% better Goal status: NOT MET  5.  Pt will demo improved LE strength with 5 x STS with UE support <= 15 seconds Baseline:  Goal status: INITIAL  6.  Pt will improve TUG to <= 16 seconds to demo improved gait and balance Baseline:  Goal status: INITIAL   PLAN:  PT FREQUENCY: 2x/week  PT DURATION: 6 weeks  PLANNED INTERVENTIONS: [id number]- PT Re-evaluation, 97110-Therapeutic exercises, 97530- Therapeutic activity, [id number]- Neuromuscular re-education, 97535- Self Care, [id number]- Manual therapy, [id number]-  Aquatic Therapy, [id number]- Electrical stimulation (unattended), Patient/Family education, Taping, Dry Needling, Cryotherapy, and Moist heat  PLAN FOR NEXT SESSION: Continue to progress standing exercises. Standing LE strength and balance, postural strength   Lamarr GORMAN Price, PTA 08/06/2023, 11:47 AM

## 2023-08-11 ENCOUNTER — Ambulatory Visit: Attending: Neurosurgery | Admitting: Physical Therapy

## 2023-08-11 ENCOUNTER — Encounter: Payer: Self-pay | Admitting: Physical Therapy

## 2023-08-11 DIAGNOSIS — R29898 Other symptoms and signs involving the musculoskeletal system: Secondary | ICD-10-CM | POA: Insufficient documentation

## 2023-08-11 DIAGNOSIS — M6281 Muscle weakness (generalized): Secondary | ICD-10-CM | POA: Diagnosis not present

## 2023-08-11 NOTE — Therapy (Signed)
 OUTPATIENT PHYSICAL THERAPY CERVICAL TREATMENT    Patient Name: Damon Acosta MRN: [id number] DOB:[birth year], 83 y.o., male Today's Date: 08/11/2023  END OF SESSION:  PT End of Session - 08/11/23 1141     Visit Number 17    Number of Visits 26    Date for PT Re-Evaluation 09/08/23    Authorization Type Medicare    Authorization - Visit Number 17    Authorization - Number of Visits 26    Progress Note Due on Visit 20    PT Start Time 1100    PT Stop Time 1141    PT Time Calculation (min) 41 min    Activity Tolerance Patient tolerated treatment well    Behavior During Therapy [facility] for tasks assessed/performed          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   CKD (chronic kidney disease)    Complication of anesthesia    patient had dificulty waking up   Coronary artery disease    Diabetes mellitus without complication ([name])    Gout    Hyperlipidemia    Hypertension    Myocardial infarction ([name])    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   colonic pseudo-obstruction, s/p colectomy with ileostomy 12/2013   Overactive bladder    Sleep apnea    Stroke [facility])    Past Surgical History:  Procedure Laterality Date   CHOLECYSTECTOMY     COLOSTOMY     POSTERIOR CERVICAL LAMINECTOMY N/A 02/09/2023   Procedure: Laminectomy  Cervical two - Cervical seven;  Surgeon: Louis Shove, MD;  Location: [facility] OR;  Service: Neurosurgery;  Laterality: N/A;   PROSTATE SURGERY     Patient Active Problem List   Diagnosis Date Noted   Cervical spondylosis with myelopathy and radiculopathy 02/09/2023   Atopic dermatitis 10/28/2022   Throat clearing 09/16/2022   Dyspnea 07/07/2022   Urinary retention 06/04/2022   Cognitive change 06/04/2022   Insomnia 03/17/2022    Callus of foot, right heel 12/11/2021   Cervical cord myelomalacia ([name]) 10/30/2021   AV block, 1st degree 08/14/2021   Sinus bradycardia on ECG 08/14/2021   OSA (obstructive sleep apnea) 08/01/2021   History of MI (myocardial infarction) 10/21/2019   Diversion colitis 10/07/2019   Gastroesophageal reflux disease without esophagitis 10/07/2019   PAF (paroxysmal atrial fibrillation) ([name]) 09/16/2019   Atrial flutter ([name]) 09/15/2019   NSTEMI (non-ST elevated myocardial infarction) ([name]) 09/15/2019   Recurrent major depressive disorder, in partial remission ([name]) 09/15/2019   Coronary artery disease of native artery of native heart with stable angina pectoris ([name]) 09/15/2019   Severe episode of recurrent major depressive disorder, without psychotic features ([name]) 09/15/2019   Asymptomatic PVCs 07/27/2018   CKD (chronic kidney disease) stage 3, GFR 30-59 ml/min ([name]) 04/27/2018   Mild cognitive impairment 04/20/2018   Microcytic anemia 04/20/2018   Primary osteoarthritis of both knees 02/17/2018   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   Type 2 diabetes mellitus with chronic kidney disease, without long-term current use of insulin  ([name]) 01/06/2018   Dyslipidemia associated with type 2 diabetes mellitus ([name]) 01/06/2018   OAB (overactive bladder) 01/06/2018   History of prostate cancer 01/06/2018   Lumbar spinal stenosis 12/15/2017   Colostomy status ([name]) 12/15/2017   Hypertension associated  with diabetes ([name]) 12/15/2017   Gout 12/15/2017   History of CVA (cerebrovascular accident) 12/15/2017   Ileostomy in place [facility]) 12/15/2017    PCP: Alvia MART PROVIDER: Victory Gunnels  REFERRING DIAG: cervical spinal stenosis with myelopathy  THERAPY DIAG:  Muscle weakness (generalized)  Other symptoms and signs involving the musculoskeletal system  Rationale for Evaluation and Treatment: Rehabilitation  ONSET DATE: 02/09/24  SUBJECTIVE:                                                                                                                                                                                                          SUBJECTIVE STATEMENT: Patient reports he felt good after last visit with minimal soreness.   PERTINENT HISTORY:  Lumbar spinal stenosis CVA CKD NSTEMI  Pt is s/p cervical laminectomy C2-C7 on 02/09/24. Since the surgery he continues with pain in his neck and with Lt UE weakness and bilat UE numbness/tingling. Prior to surgery he used a PFRW for ambulation but he has had a few recent falls and is now using a power w/c for mobility in and out of the house. Pt reports difficulty using Lt UE for functional activities. He reports he drops items often due to numbness in hands. Numbness does not get better or worse, it is just always there.  Hand dominance: Right  PAIN:  Are you having pain? Yes: NPRS scale: 6/10 currently, 8/10 at worst Pain location: neck, LT UE Pain description: sore, ache Aggravating factors: increased use of Lt UE Relieving factors: meds  PRECAUTIONS: Fall  RED FLAGS: None     WEIGHT BEARING RESTRICTIONS: No  FALLS:  Has patient fallen in last 6 months? Yes. Number of falls 2-3  LIVING ENVIRONMENT: Lives with: lives with their family Lives in: House/apartment Stairs: No Has following equipment at home: Environmental consultant - 2 wheeled and Wheelchair (power)  OCCUPATION: retired  PLOF: Independent with basic ADLs, Independent with household mobility with device, and Requires assistive device for independence  PATIENT GOALS: decrease pain, get Lt UE to work, decrease numbness  NEXT MD VISIT: July Burtis)  OBJECTIVE:  Note: Objective measures were completed at Evaluation unless otherwise noted.  DIAGNOSTIC FINDINGS:  None on file since surgery  PATIENT SURVEYS:  NDI 26/50 - 52%  COGNITION: Overall cognitive status: History of cognitive impairments - at baseline  SENSATION: Light touch: Impaired   POSTURE:  forward head  PALPATION: Increased mm spasticity Rt > Lt cervical paraspinals Increased tenderness to light touch throughout cervical musculature, UT bilat Some decreased symptoms with manual traction  CERVICAL ROM:   Active ROM A/PROM (deg) eval AROM 07/28/23  Flexion 34 30  Extension 14 pain 10  Right lateral flexion 35 28  Left lateral flexion 25 33  Right rotation 38 55  Left rotation 40 57   (Blank rows = not tested)  UPPER EXTREMITY MMT:  MMT Right eval Left eval Right 07/14/23 Left 07/14/23 Rt/Lt 07/28/23  Shoulder flexion 4 3- 4 4- 4-/4-  Shoulder extension       Shoulder abduction 4 3- 4 4- 4/4-  Shoulder adduction       Shoulder extension       Shoulder internal rotation       Shoulder external rotation       Elbow flexion 4+ 3+ 4+ 4- 4+/4-  Elbow extension 4+ 3 4+ 4+ 4+/4-  Wrist flexion       Wrist extension       Wrist ulnar deviation       Wrist radial deviation       Wrist pronation       GRIP STRENGTH 50 19 60 53    (Blank rows = not tested)  UPPER EXTREMITY ROM:  Active ROM Right eval Left eval  Shoulder flexion WFL 60  Shoulder extension    Shoulder abduction WFL 85 pain  Shoulder adduction    Shoulder extension    Shoulder internal rotation    Shoulder external rotation    Elbow flexion    Elbow extension    Wrist flexion    Wrist extension    Wrist ulnar deviation    Wrist radial deviation    Wrist pronation    Wrist supination     (Blank rows = not tested)  LOWER EXTREMITY MMT:    MMT Right eval Left eval  Hip flexion 4- 4-  Hip extension    Hip abduction    Hip adduction    Hip internal rotation    Hip external rotation    Knee flexion 4 4  Knee extension 4- 4-  Ankle dorsiflexion 4- 4-  Ankle plantarflexion    Ankle inversion    Ankle eversion     (Blank rows = not tested)  CERVICAL SPECIAL TESTS:  Spurling's test: Positive  5 x STS with UE support = 18.15 seconds (07/28/23) TUG (no AD): 21.84 seconds  (07/28/23)   [facility] Adult PT Treatment:                                                DATE: 08/11/23 Therapeutic Exercise/Activity: Standing Heel raises x 20 Toe raises x 20  Mini squat x 10 Hip abd red TB Hip ext red TB HS curls red TB Dumbell curl to overhead lift 1# Scap squeeze with tactile cues for chest lift and to reduce cervical flexion Backward shoulder rolls cues for posture Wall push up x 10 Modified plantigrade scap squeeze and chin tuck Seated Overhead UE lift bilat UE --> single UE Sit <> stand focus on chest lift and posture Punch 1# DB Scaption 1# DB L arms x 10 Horizontal abd red TB   [facility] Adult PT Treatment:  DATE: 08/06/2023 Therapeutic Exercise: Seated bkwd shoulder circles Cervical AROM all directions Standing at high table:  Heel raises x20 Toe raises x20 Gastroc stretch 3x20 (bil) Hip abd  Hip ext HS curls Neuromuscular re-ed: Seated: Cervical retraction + scap squeeze --> folded pillow behind head Cervical retraction + shoulder flexion with 1#DB Resisted knee extension + RTB Therapeutic Activity: STS + UE push-off on armrests + chest lift in standing x10 Standing mini squats + UE support Standing + 1#DB curl & overhead lift    [facility] Adult PT Treatment:                                                DATE: 08/04/2023 Therapeutic Exercise: Seated: Hip abd + GTB x10 LAQ + 3#AW x10 (bil) Marching + RTB around thighs Standing at counter:  Heel raises x10 Toe raises + isometric hold x10 Gastroc stretch Seated bkwd shoulder circles Neuromuscular re-ed: Seated: Cervical retraction + scap squeeze --> folded pillow behind head Cervical retraction + shoulder ER Therapeutic Activity: Sit to stand + UE push off from arm rests --> fatigues quickly Seated bicep curl + overhead press with 1#DB --> to fatigue on Lt UE Standing squats (at counter) + chest lift in standing x10   [facility] Adult PT Treatment:                                                 DATE: 07/28/23 Therapeutic Exercise/Activity/NMR: 5 x STS with UE support = 18.15 seconds LE MMT - see above TUG: 21.84 seconds Seated with noodle Pec stretch with chest lift Backward shoulder rolls Thoracic extension over horizontal noodle Standing back against wall + noodle vertical at back + folded pillow behind head: Cervical retraction into folded pillow Cervical rotation Backward shoulder rolls Bear hug swimmers Isometric chin tuck into folded pillow + arm raises holding:  1#DB raises in flexion 1#DB bicep curls  1#DB raises in scaption   [facility] Adult PT Treatment:                                                DATE: 07/23/2023 Therapeutic Exercise: Seated with 1/2 foam roller behind back: Bent arm chest lift + pec stretch 10x5 Seated thoracic extension stretch with noodle horiz at back Neuromuscular re-ed: Seated cervical isometrics with therapist assist Standing back against wall + noodle vertical at back: Cervical retraction into folded pillow Isometric chin tuck into folded pillow + arm raises holding:  1#DB raises in flexion 1#DB bicep curls  1#DB raises in scaption   PATIENT EDUCATION:  Education details: Updated HEP Person educated: Patient Education method: Explanation, Demonstration, and Handouts Education comprehension: verbalized understanding and returned demonstration  HOME EXERCISE PROGRAM: Access Code: [email or web address] URL: [email or web address] Date: 08/06/2023 Prepared by: Lamarr Price  Exercises - Seated Scapular Retraction  - 1 x daily - 7 x weekly - 2 sets - 10 reps - Supine Cervical Retraction with Towel  - 1 x daily - 7 x weekly - 3 sets - 10 reps - Supine Cervical Rotation AROM on Pillow  - 1 x daily - 7 x  weekly - 3 sets - 10 reps - Seated Shoulder Row with Anchored Resistance  - 1 x daily - 7 x weekly - 3 sets - 10 reps - Dead Bug  - 1 x daily - 7 x weekly - 3 sets - 10 reps -  Shoulder Flexion Wall Slide with Towel  - 1 x daily - 7 x weekly - 3 sets - 10 reps - Standing Isometric Cervical Sidebending with Manual Resistance  - 2 x daily - 7 x weekly - 2 sets - 10 reps - 5 sec hold - Standing Isometric Cervical Flexion with Manual Resistance  - 2 x daily - 7 x weekly - 2 sets - 10 reps - 5 sec hold - Standing Isometric Cervical Extension with Manual Resistance  - 2 x daily - 7 x weekly - 2 sets - 10 reps - 5 sec hold - Standing Isometric Shoulder Abduction with Doorway  - 2 x daily - 7 x weekly - 2 sets - 10 reps - 5 sec hold - Standing Isometric Shoulder Abduction with Doorway - Arm Bent  - 2 x daily - 7 x weekly - 2 sets - 10 reps - 5 sec hold - Seated Shoulder Horizontal Abduction with Resistance  - 1 x daily - 7 x weekly - 3 sets - 10 reps - Seated Single Arm Shoulder External Rotation with Self-Anchored Resistance  - 1 x daily - 7 x weekly - 3 sets - 10 reps - Sit to Stand with Armchair  - 1 x daily - 7 x weekly - 2 sets - 10 reps - Heel Toe Raises with Counter Support  - 1 x daily - 7 x weekly - 3 sets - 10 reps - Seated Hip Abduction with Resistance  - 1 x daily - 7 x weekly - 3 sets - 10 reps - Seated March with Resistance  - 1 x daily - 7 x weekly - 3 sets - 10 reps - Standing Gastroc Stretch  - 2 x daily - 7 x weekly - 1 sets - 5-3 reps - 10-30 sec hold - Standing Hip Abduction with Counter Support  - 1 x daily - 7 x weekly - 3 sets - 10 reps - Standing Hip Extension with Counter Support  - 1 x daily - 7 x weekly - 3 sets - 10 reps - Standing Hamstring Curl with Chair Support  - 1 x daily - 7 x weekly - 3 sets - 10 reps - Mini Squat with Counter Support  - 1 x daily - 7 x weekly - 3 sets - 10 reps  ASSESSMENT:  CLINICAL IMPRESSION: Pt able to remain standing x 10 min without seated rest. Session performed almost entirely in standing with cues for posture. Pt is progressing well with postural and LE strength   OBJECTIVE IMPAIRMENTS: decreased activity  tolerance, decreased mobility, decreased ROM, decreased strength, increased muscle spasms, impaired sensation, impaired UE functional use, postural dysfunction, and pain.     GOALS: Goals reviewed with patient? Yes  SHORT TERM GOALS: Target date: 07/01/2023  Pt will be independent in initial HEP Baseline:  Goal status: MET  2.  Pt will improve Lt UE AROM to 90 degrees flexion and 100 degrees abduction Baseline: see above 06/30/23: flexion 108 deg, abd 76 deg Goal status: IN PROGRESS   LONG TERM GOALS: Target date: 09/08/2023  Pt will be independent with advanced HEP Baseline:  Goal status: MET  2.  NDI will improve to <= 16/50  to demo improved functional mobility Baseline: 26/50 07/14/23: 22/50 = 44.0% Goal status: IN PROGRESS  3.  Pt will improve Lt UE strength to 4/5 to improve functional activity tolerance Baseline: see above 07/14/23: see above Goal status: IN PROGRESS  4.  Pt will report 50% reduction in numbness in bilat UEs Baseline:  07/07/23: no change 07/14/23: no change 07/28/23: 10% better Goal status: NOT MET  5.  Pt will demo improved LE strength with 5 x STS with UE support <= 15 seconds Baseline:  Goal status: INITIAL  6.  Pt will improve TUG to <= 16 seconds to demo improved gait and balance Baseline:  Goal status: INITIAL   PLAN:  PT FREQUENCY: 2x/week  PT DURATION: 6 weeks  PLANNED INTERVENTIONS: [id number]- PT Re-evaluation, 97110-Therapeutic exercises, 97530- Therapeutic activity, [id number]- Neuromuscular re-education, 97535- Self Care, [id number]- Manual therapy, [id number]- Aquatic Therapy, [id number]- Electrical stimulation (unattended), Patient/Family education, Taping, Dry Needling, Cryotherapy, and Moist heat  PLAN FOR NEXT SESSION: Continue to progress standing exercises. Standing LE strength and balance, postural strength   Philomina Leon, PT 08/11/2023, 11:41 AM

## 2023-08-13 ENCOUNTER — Ambulatory Visit

## 2023-08-13 DIAGNOSIS — M6281 Muscle weakness (generalized): Secondary | ICD-10-CM

## 2023-08-13 DIAGNOSIS — R29898 Other symptoms and signs involving the musculoskeletal system: Secondary | ICD-10-CM

## 2023-08-13 NOTE — Therapy (Signed)
 OUTPATIENT PHYSICAL THERAPY CERVICAL TREATMENT    Patient Name: Damon Acosta MRN: [id number] DOB:12-23-1940, 83 y.o., male Today's Date: 08/13/2023  END OF SESSION:  PT End of Session - 08/13/23 1058     Visit Number 18    Number of Visits 26    Date for PT Re-Evaluation 09/08/23    Authorization Type Medicare    Authorization - Visit Number 18    Authorization - Number of Visits 26    Progress Note Due on Visit 20    PT Start Time 1100    PT Stop Time 1140    PT Time Calculation (min) 40 min    Activity Tolerance Patient tolerated treatment well    Behavior During Therapy WFL for tasks assessed/performed         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   CKD (chronic kidney disease)    Complication of anesthesia    patient had dificulty waking up   Coronary artery disease    Diabetes mellitus without complication ([name])    Gout    Hyperlipidemia    Hypertension    Myocardial infarction ([name])    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   colonic pseudo-obstruction, s/p colectomy with ileostomy 12/2013   Overactive bladder    Sleep apnea    Stroke [facility])    Past Surgical History:  Procedure Laterality Date   CHOLECYSTECTOMY     COLOSTOMY     POSTERIOR CERVICAL LAMINECTOMY N/A 02/09/2023   Procedure: Laminectomy  Cervical two - Cervical seven;  Surgeon: Louis Shove, MD;  Location: [facility] OR;  Service: Neurosurgery;  Laterality: N/A;   PROSTATE SURGERY     Patient Active Problem List   Diagnosis Date Noted   Cervical spondylosis with myelopathy and radiculopathy 02/09/2023   Atopic dermatitis 10/28/2022   Throat clearing 09/16/2022   Dyspnea 07/07/2022   Urinary retention 06/04/2022   Cognitive change 06/04/2022   Insomnia 03/17/2022    Callus of foot, right heel 12/11/2021   Cervical cord myelomalacia ([name]) 10/30/2021   AV block, 1st degree 08/14/2021   Sinus bradycardia on ECG 08/14/2021   OSA (obstructive sleep apnea) 08/01/2021   History of MI (myocardial infarction) 10/21/2019   Diversion colitis 10/07/2019   Gastroesophageal reflux disease without esophagitis 10/07/2019   PAF (paroxysmal atrial fibrillation) ([name]) 09/16/2019   Atrial flutter ([name]) 09/15/2019   NSTEMI (non-ST elevated myocardial infarction) ([name]) 09/15/2019   Recurrent major depressive disorder, in partial remission ([name]) 09/15/2019   Coronary artery disease of native artery of native heart with stable angina pectoris ([name]) 09/15/2019   Severe episode of recurrent major depressive disorder, without psychotic features ([name]) 09/15/2019   Asymptomatic PVCs 07/27/2018   CKD (chronic kidney disease) stage 3, GFR 30-59 ml/min ([name]) 04/27/2018   Mild cognitive impairment 04/20/2018   Microcytic anemia 04/20/2018   Primary osteoarthritis of both knees 02/17/2018   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   Type 2 diabetes mellitus with chronic kidney disease, without long-term current use of insulin  ([name]) 01/06/2018   Dyslipidemia associated with type 2 diabetes mellitus ([name]) 01/06/2018   OAB (overactive bladder) 01/06/2018   History of prostate cancer 01/06/2018   Lumbar spinal stenosis 12/15/2017   Colostomy status ([name]) 12/15/2017   Hypertension associated with  diabetes ([name]) 12/15/2017   Gout 12/15/2017   History of CVA (cerebrovascular accident) 12/15/2017   Ileostomy in place [facility]) 12/15/2017    PCP: Alvia MART PROVIDER: Victory Gunnels  REFERRING DIAG: cervical spinal stenosis with myelopathy  THERAPY DIAG:  Muscle weakness (generalized)  Other symptoms and signs involving the musculoskeletal system  Rationale for Evaluation and Treatment: Rehabilitation  ONSET DATE: 02/09/24  SUBJECTIVE:                                                                                                                                                                                                          SUBJECTIVE STATEMENT: Patient reports he feels like his Lt arm is getting stronger and has a lot less pain in neck but still has a hard time    PERTINENT HISTORY:  Lumbar spinal stenosis CVA CKD NSTEMI  Pt is s/p cervical laminectomy C2-C7 on 02/09/24. Since the surgery he continues with pain in his neck and with Lt UE weakness and bilat UE numbness/tingling. Prior to surgery he used a PFRW for ambulation but he has had a few recent falls and is now using a power w/c for mobility in and out of the house. Pt reports difficulty using Lt UE for functional activities. He reports he drops items often due to numbness in hands. Numbness does not get better or worse, it is just always there.  Hand dominance: Right  PAIN:  Are you having pain? Yes: NPRS scale: 6/10 currently, 8/10 at worst Pain location: neck, LT UE Pain description: sore, ache Aggravating factors: increased use of Lt UE Relieving factors: meds  PRECAUTIONS: Fall  RED FLAGS: None     WEIGHT BEARING RESTRICTIONS: No  FALLS:  Has patient fallen in last 6 months? Yes. Number of falls 2-3  LIVING ENVIRONMENT: Lives with: lives with their family Lives in: House/apartment Stairs: No Has following equipment at home: Environmental consultant - 2 wheeled and Wheelchair (power)  OCCUPATION: retired  PLOF: Independent with basic ADLs, Independent with household mobility with device, and Requires assistive device for independence  PATIENT GOALS: decrease pain, get Lt UE to work, decrease numbness  NEXT MD VISIT: 09/05/23 [facility])  OBJECTIVE:  Note: Objective measures were completed at Evaluation unless otherwise noted.  DIAGNOSTIC FINDINGS:  None on file since surgery  PATIENT SURVEYS:  NDI 26/50 - 52%  COGNITION: Overall cognitive status: History of cognitive impairments - at  baseline  SENSATION: Light touch: Impaired   POSTURE: forward head  PALPATION: Increased mm spasticity Rt > Lt cervical paraspinals Increased tenderness to  light touch throughout cervical musculature, UT bilat Some decreased symptoms with manual traction   CERVICAL ROM:   Active ROM A/PROM (deg) eval AROM 07/28/23  Flexion 34 30  Extension 14 pain 10  Right lateral flexion 35 28  Left lateral flexion 25 33  Right rotation 38 55  Left rotation 40 57   (Blank rows = not tested)  UPPER EXTREMITY MMT:  MMT Right eval Left eval Right 07/14/23 Left 07/14/23 Rt/Lt 07/28/23  Shoulder flexion 4 3- 4 4- 4-/4-  Shoulder extension       Shoulder abduction 4 3- 4 4- 4/4-  Shoulder adduction       Shoulder extension       Shoulder internal rotation       Shoulder external rotation       Elbow flexion 4+ 3+ 4+ 4- 4+/4-  Elbow extension 4+ 3 4+ 4+ 4+/4-  Wrist flexion       Wrist extension       Wrist ulnar deviation       Wrist radial deviation       Wrist pronation       GRIP STRENGTH 50 19 60 53    (Blank rows = not tested)  UPPER EXTREMITY ROM:  Active ROM Right eval Left eval  Shoulder flexion WFL 60  Shoulder extension    Shoulder abduction WFL 85 pain  Shoulder adduction    Shoulder extension    Shoulder internal rotation    Shoulder external rotation    Elbow flexion    Elbow extension    Wrist flexion    Wrist extension    Wrist ulnar deviation    Wrist radial deviation    Wrist pronation    Wrist supination     (Blank rows = not tested)  LOWER EXTREMITY MMT:    MMT Right eval Left eval  Hip flexion 4- 4-  Hip extension    Hip abduction    Hip adduction    Hip internal rotation    Hip external rotation    Knee flexion 4 4  Knee extension 4- 4-  Ankle dorsiflexion 4- 4-  Ankle plantarflexion    Ankle inversion    Ankle eversion     (Blank rows = not tested)  CERVICAL SPECIAL TESTS:  Spurling's test: Positive  5 x STS with UE support = 18.15  seconds (07/28/23) TUG (no AD): 21.84 seconds (07/28/23)   [facility] Adult PT Treatment:                                                DATE: 08/13/2023 Therapeutic Exercise: Standing: Gastroc stretch Heel raises Toe raises Hip abd  Butt kickers  Seated: Pec stretch with bent arm shoulder ER + rolled yoga mat behind spine HS stretch --> foot propped on edge of treadmill Cervical AROM all directions Neuromuscular re-ed: Seated: Horizontal abd red TB + scap retraction Bicep curl + overhead press with 1#DB --> reps to fatigue Standing: STS with focus on eccentric control --> UE support to stand, no UE support to sit Diagonal pull ups+ YTB opp  Marching + 3#AW  HS curls + 3#AW Therapeutic Activity: Standing: Squats x10 Staggered stance squats x10 each leg Abduction arm raises + 1#DB x10 Walking no AD x 30' (SBA)    [facility] Adult PT Treatment:  DATE: 08/11/23 Therapeutic Exercise/Activity: Standing Heel raises x 20 Toe raises x 20  Mini squat x 10 Hip abd red TB Hip ext red TB HS curls red TB Dumbell curl to overhead lift 1# Scap squeeze with tactile cues for chest lift and to reduce cervical flexion Backward shoulder rolls cues for posture Wall push up x 10 Modified plantigrade scap squeeze and chin tuck Seated Overhead UE lift bilat UE --> single UE Sit <> stand focus on chest lift and posture Punch 1# DB Scaption 1# DB L arms x 10 Horizontal abd red TB   [facility] Adult PT Treatment:                                                DATE: 08/06/2023 Therapeutic Exercise: Seated bkwd shoulder circles Cervical AROM all directions Standing at high table:  Heel raises x20 Toe raises x20 Gastroc stretch 3x20 (bil) Hip abd  Hip ext HS curls Neuromuscular re-ed: Seated: Cervical retraction + scap squeeze --> folded pillow behind head Cervical retraction + shoulder flexion with 1#DB Resisted knee extension + RTB Therapeutic  Activity: STS + UE push-off on armrests + chest lift in standing x10 Standing mini squats + UE support Standing + 1#DB curl & overhead lift   PATIENT EDUCATION:  Education details: Updated HEP Person educated: Patient Education method: Explanation, Demonstration, and Handouts Education comprehension: verbalized understanding and returned demonstration  HOME EXERCISE PROGRAM: Access Code: [email or web address] URL: [email or web address] Date: 08/06/2023 Prepared by: Lamarr Price  Exercises - Seated Scapular Retraction  - 1 x daily - 7 x weekly - 2 sets - 10 reps - Supine Cervical Retraction with Towel  - 1 x daily - 7 x weekly - 3 sets - 10 reps - Supine Cervical Rotation AROM on Pillow  - 1 x daily - 7 x weekly - 3 sets - 10 reps - Seated Shoulder Row with Anchored Resistance  - 1 x daily - 7 x weekly - 3 sets - 10 reps - Dead Bug  - 1 x daily - 7 x weekly - 3 sets - 10 reps - Shoulder Flexion Wall Slide with Towel  - 1 x daily - 7 x weekly - 3 sets - 10 reps - Standing Isometric Cervical Sidebending with Manual Resistance  - 2 x daily - 7 x weekly - 2 sets - 10 reps - 5 sec hold - Standing Isometric Cervical Flexion with Manual Resistance  - 2 x daily - 7 x weekly - 2 sets - 10 reps - 5 sec hold - Standing Isometric Cervical Extension with Manual Resistance  - 2 x daily - 7 x weekly - 2 sets - 10 reps - 5 sec hold - Standing Isometric Shoulder Abduction with Doorway  - 2 x daily - 7 x weekly - 2 sets - 10 reps - 5 sec hold - Standing Isometric Shoulder Abduction with Doorway - Arm Bent  - 2 x daily - 7 x weekly - 2 sets - 10 reps - 5 sec hold - Seated Shoulder Horizontal Abduction with Resistance  - 1 x daily - 7 x weekly - 3 sets - 10 reps - Seated Single Arm Shoulder External Rotation with Self-Anchored Resistance  - 1 x daily - 7 x weekly - 3 sets - 10 reps - Sit to Stand with Armchair  - 1 x daily -  7 x weekly - 2 sets - 10 reps - Heel Toe Raises with Counter Support  - 1 x  daily - 7 x weekly - 3 sets - 10 reps - Seated Hip Abduction with Resistance  - 1 x daily - 7 x weekly - 3 sets - 10 reps - Seated March with Resistance  - 1 x daily - 7 x weekly - 3 sets - 10 reps - Standing Gastroc Stretch  - 2 x daily - 7 x weekly - 1 sets - 5-3 reps - 10-30 sec hold - Standing Hip Abduction with Counter Support  - 1 x daily - 7 x weekly - 3 sets - 10 reps - Standing Hip Extension with Counter Support  - 1 x daily - 7 x weekly - 3 sets - 10 reps - Standing Hamstring Curl with Chair Support  - 1 x daily - 7 x weekly - 3 sets - 10 reps - Mini Squat with Counter Support  - 1 x daily - 7 x weekly - 3 sets - 10 reps  ASSESSMENT:  CLINICAL IMPRESSION:  Focus on standing exercises continued; noted increase endurance in standing time before needing seated rest break. Increased repetition with left arm raises demonstrated before range limitation due to fatigue. Steady improvement demonstrated with overall strength and endurance; patient continues to have difficulty maintaining upright head posture in standing.   OBJECTIVE IMPAIRMENTS: decreased activity tolerance, decreased mobility, decreased ROM, decreased strength, increased muscle spasms, impaired sensation, impaired UE functional use, postural dysfunction, and pain.     GOALS: Goals reviewed with patient? Yes  SHORT TERM GOALS: Target date: 07/01/2023  Pt will be independent in initial HEP Baseline:  Goal status: MET  2.  Pt will improve Lt UE AROM to 90 degrees flexion and 100 degrees abduction Baseline: see above 06/30/23: flexion 108 deg, abd 76 deg Goal status: IN PROGRESS   LONG TERM GOALS: Target date: 09/08/2023  Pt will be independent with advanced HEP Baseline:  Goal status: MET  2.  NDI will improve to <= 16/50 to demo improved functional mobility Baseline: 26/50 07/14/23: 22/50 = 44.0% Goal status: IN PROGRESS  3.  Pt will improve Lt UE strength to 4/5 to improve functional activity  tolerance Baseline: see above 07/14/23: see above Goal status: IN PROGRESS  4.  Pt will report 50% reduction in numbness in bilat UEs Baseline:  07/07/23: no change 07/14/23: no change 07/28/23: 10% better Goal status: NOT MET  5.  Pt will demo improved LE strength with 5 x STS with UE support <= 15 seconds Baseline:  Goal status: INITIAL  6.  Pt will improve TUG to <= 16 seconds to demo improved gait and balance Baseline:  Goal status: INITIAL   PLAN:  PT FREQUENCY: 2x/week  PT DURATION: 6 weeks  PLANNED INTERVENTIONS: [id number]- PT Re-evaluation, 97110-Therapeutic exercises, 97530- Therapeutic activity, [id number]- Neuromuscular re-education, 97535- Self Care, [id number]- Manual therapy, [id number]- Aquatic Therapy, [id number]- Electrical stimulation (unattended), Patient/Family education, Taping, Dry Needling, Cryotherapy, and Moist heat  PLAN FOR NEXT SESSION: Needs to schedule more visits - 2 more weeks needed. Continue to progress standing exercises. Standing LE strength and balance, postural strength   Lamarr GORMAN Price, PTA 08/13/2023, 11:50 AM

## 2023-08-18 ENCOUNTER — Encounter: Payer: Self-pay | Admitting: Physical Therapy

## 2023-08-18 ENCOUNTER — Ambulatory Visit: Admitting: Physical Therapy

## 2023-08-18 DIAGNOSIS — R29898 Other symptoms and signs involving the musculoskeletal system: Secondary | ICD-10-CM

## 2023-08-18 DIAGNOSIS — M6281 Muscle weakness (generalized): Secondary | ICD-10-CM

## 2023-08-18 NOTE — Therapy (Signed)
 OUTPATIENT PHYSICAL THERAPY CERVICAL TREATMENT    Patient Name: Damon Acosta MRN: [id number] DOB:04-Apr-1940, 83 y.o., male Today's Date: 08/18/2023  END OF SESSION:  PT End of Session - 08/18/23 1225     Visit Number 19    Number of Visits 26    Date for PT Re-Evaluation 09/08/23    Authorization Type Medicare    Authorization - Visit Number 19    Progress Note Due on Visit 20    PT Start Time 1145    PT Stop Time 1225    PT Time Calculation (min) 40 min    Activity Tolerance Patient tolerated treatment well    Behavior During Therapy [facility] for tasks assessed/performed          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   CKD (chronic kidney disease)    Complication of anesthesia    patient had dificulty waking up   Coronary artery disease    Diabetes mellitus without complication ([name])    Gout    Hyperlipidemia    Hypertension    Myocardial infarction ([name])    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   colonic pseudo-obstruction, s/p colectomy with ileostomy 12/2013   Overactive bladder    Sleep apnea    Stroke [facility])    Past Surgical History:  Procedure Laterality Date   CHOLECYSTECTOMY     COLOSTOMY     POSTERIOR CERVICAL LAMINECTOMY N/A 02/09/2023   Procedure: Laminectomy  Cervical two - Cervical seven;  Surgeon: Louis Shove, MD;  Location: [facility] OR;  Service: Neurosurgery;  Laterality: N/A;   PROSTATE SURGERY     Patient Active Problem List   Diagnosis Date Noted   Cervical spondylosis with myelopathy and radiculopathy 02/09/2023   Atopic dermatitis 10/28/2022   Throat clearing 09/16/2022   Dyspnea 07/07/2022   Urinary retention 06/04/2022   Cognitive change 06/04/2022   Insomnia 03/17/2022   Callus of foot, right heel 12/11/2021    Cervical cord myelomalacia ([name]) 10/30/2021   AV block, 1st degree 08/14/2021   Sinus bradycardia on ECG 08/14/2021   OSA (obstructive sleep apnea) 08/01/2021   History of MI (myocardial infarction) 10/21/2019   Diversion colitis 10/07/2019   Gastroesophageal reflux disease without esophagitis 10/07/2019   PAF (paroxysmal atrial fibrillation) ([name]) 09/16/2019   Atrial flutter ([name]) 09/15/2019   NSTEMI (non-ST elevated myocardial infarction) ([name]) 09/15/2019   Recurrent major depressive disorder, in partial remission ([name]) 09/15/2019   Coronary artery disease of native artery of native heart with stable angina pectoris ([name]) 09/15/2019   Severe episode of recurrent major depressive disorder, without psychotic features ([name]) 09/15/2019   Asymptomatic PVCs 07/27/2018   CKD (chronic kidney disease) stage 3, GFR 30-59 ml/min ([name]) 04/27/2018   Mild cognitive impairment 04/20/2018   Microcytic anemia 04/20/2018   Primary osteoarthritis of both knees 02/17/2018   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   Type 2 diabetes mellitus with chronic kidney disease, without long-term current use of insulin  ([name]) 01/06/2018   Dyslipidemia associated with type 2 diabetes mellitus ([name]) 01/06/2018   OAB (overactive bladder) 01/06/2018   History of prostate cancer 01/06/2018   Lumbar spinal stenosis 12/15/2017   Colostomy status ([name]) 12/15/2017   Hypertension associated with diabetes ([name]) 12/15/2017   Gout 12/15/2017  History of CVA (cerebrovascular accident) 12/15/2017   Ileostomy in place [facility]) 12/15/2017    PCP: Alvia MART PROVIDER: Victory Gunnels  REFERRING DIAG: cervical spinal stenosis with myelopathy  THERAPY DIAG:  Muscle weakness (generalized)  Other symptoms and signs involving the musculoskeletal system  Rationale for Evaluation and Treatment: Rehabilitation  ONSET DATE: 02/09/24  SUBJECTIVE:                                                                                                                                                                                                          SUBJECTIVE STATEMENT: Patient reports he feels sore in his legs when he walks. He thinks he is slowly getting stronger   PERTINENT HISTORY:  Lumbar spinal stenosis CVA CKD NSTEMI  Pt is s/p cervical laminectomy C2-C7 on 02/09/24. Since the surgery he continues with pain in his neck and with Lt UE weakness and bilat UE numbness/tingling. Prior to surgery he used a PFRW for ambulation but he has had a few recent falls and is now using a power w/c for mobility in and out of the house. Pt reports difficulty using Lt UE for functional activities. He reports he drops items often due to numbness in hands. Numbness does not get better or worse, it is just always there.  Hand dominance: Right  PAIN:  Are you having pain? Yes: NPRS scale: 6/10 currently, 8/10 at worst Pain location: neck, LT UE Pain description: sore, ache Aggravating factors: increased use of Lt UE Relieving factors: meds  PRECAUTIONS: Fall  RED FLAGS: None     WEIGHT BEARING RESTRICTIONS: No  FALLS:  Has patient fallen in last 6 months? Yes. Number of falls 2-3  LIVING ENVIRONMENT: Lives with: lives with their family Lives in: House/apartment Stairs: No Has following equipment at home: Environmental consultant - 2 wheeled and Wheelchair (power)  OCCUPATION: retired  PLOF: Independent with basic ADLs, Independent with household mobility with device, and Requires assistive device for independence  PATIENT GOALS: decrease pain, get Lt UE to work, decrease numbness  NEXT MD VISIT: 09/05/23 [place])  OBJECTIVE:  Note: Objective measures were completed at Evaluation unless otherwise noted.  DIAGNOSTIC FINDINGS:  None on file since surgery  PATIENT SURVEYS:  NDI 26/50 - 52%  COGNITION: Overall cognitive status: History of cognitive impairments - at baseline  SENSATION: Light touch: Impaired   POSTURE: forward  head  PALPATION: Increased mm spasticity Rt > Lt cervical paraspinals Increased tenderness to light touch throughout cervical musculature, UT bilat Some decreased symptoms with manual traction   CERVICAL ROM:  Active ROM A/PROM (deg) eval AROM 07/28/23  Flexion 34 30  Extension 14 pain 10  Right lateral flexion 35 28  Left lateral flexion 25 33  Right rotation 38 55  Left rotation 40 57   (Blank rows = not tested)  UPPER EXTREMITY MMT:  MMT Right eval Left eval Right 07/14/23 Left 07/14/23 Rt/Lt 07/28/23  Shoulder flexion 4 3- 4 4- 4-/4-  Shoulder extension       Shoulder abduction 4 3- 4 4- 4/4-  Shoulder adduction       Shoulder extension       Shoulder internal rotation       Shoulder external rotation       Elbow flexion 4+ 3+ 4+ 4- 4+/4-  Elbow extension 4+ 3 4+ 4+ 4+/4-  Wrist flexion       Wrist extension       Wrist ulnar deviation       Wrist radial deviation       Wrist pronation       GRIP STRENGTH 50 19 60 53    (Blank rows = not tested)  UPPER EXTREMITY ROM:  Active ROM Right eval Left eval  Shoulder flexion WFL 60  Shoulder extension    Shoulder abduction WFL 85 pain  Shoulder adduction    Shoulder extension    Shoulder internal rotation    Shoulder external rotation    Elbow flexion    Elbow extension    Wrist flexion    Wrist extension    Wrist ulnar deviation    Wrist radial deviation    Wrist pronation    Wrist supination     (Blank rows = not tested)  LOWER EXTREMITY MMT:    MMT Right eval Left eval  Hip flexion 4- 4-  Hip extension    Hip abduction    Hip adduction    Hip internal rotation    Hip external rotation    Knee flexion 4 4  Knee extension 4- 4-  Ankle dorsiflexion 4- 4-  Ankle plantarflexion    Ankle inversion    Ankle eversion     (Blank rows = not tested)  CERVICAL SPECIAL TESTS:  Spurling's test: Positive  5 x STS with UE support = 18.15 seconds (07/28/23) TUG (no AD): 21.84 seconds (07/28/23)   [facility]  Adult PT Treatment:                                                DATE: 08/18/23 Therapeutic Exercise/Activity/NMR: Gait without AD 80' x 3 with CGA - min A to correct LOB Seated Bicep curl to overhead press 1# 2 x 8 Bent arm shoulder abd 1# 2 x 8 Pec stretch with yoga mat behind spine Cervical ROM all directions Standing Hip abd HS curl Heel raises Toe raises Squat Staggered stance squat Sit <> stand focus on eccentric control  Self Care: NDI 20/50  [facility] Adult PT Treatment:                                                DATE: 08/13/2023 Therapeutic Exercise: Standing: Gastroc stretch Heel raises Toe raises Hip abd  Butt kickers  Seated: Pec stretch with bent arm shoulder ER +  rolled yoga mat behind spine HS stretch --> foot propped on edge of treadmill Cervical AROM all directions Neuromuscular re-ed: Seated: Horizontal abd red TB + scap retraction Bicep curl + overhead press with 1#DB --> reps to fatigue Standing: STS with focus on eccentric control --> UE support to stand, no UE support to sit Diagonal pull ups+ YTB opp  Marching + 3#AW  HS curls + 3#AW Therapeutic Activity: Standing: Squats x10 Staggered stance squats x10 each leg Abduction arm raises + 1#DB x10 Walking no AD x 30' (SBA)    [facility] Adult PT Treatment:                                                DATE: 08/11/23 Therapeutic Exercise/Activity: Standing Heel raises x 20 Toe raises x 20  Mini squat x 10 Hip abd red TB Hip ext red TB HS curls red TB Dumbell curl to overhead lift 1# Scap squeeze with tactile cues for chest lift and to reduce cervical flexion Backward shoulder rolls cues for posture Wall push up x 10 Modified plantigrade scap squeeze and chin tuck Seated Overhead UE lift bilat UE --> single UE Sit <> stand focus on chest lift and posture Punch 1# DB Scaption 1# DB L arms x 10 Horizontal abd red TB     PATIENT EDUCATION:  Education details: Updated HEP Person  educated: Patient Education method: Explanation, Demonstration, and Handouts Education comprehension: verbalized understanding and returned demonstration  HOME EXERCISE PROGRAM: Access Code: [email or web address] URL: [email or web address] Date: 08/06/2023 Prepared by: Lamarr Price  Exercises - Seated Scapular Retraction  - 1 x daily - 7 x weekly - 2 sets - 10 reps - Supine Cervical Retraction with Towel  - 1 x daily - 7 x weekly - 3 sets - 10 reps - Supine Cervical Rotation AROM on Pillow  - 1 x daily - 7 x weekly - 3 sets - 10 reps - Seated Shoulder Row with Anchored Resistance  - 1 x daily - 7 x weekly - 3 sets - 10 reps - Dead Bug  - 1 x daily - 7 x weekly - 3 sets - 10 reps - Shoulder Flexion Wall Slide with Towel  - 1 x daily - 7 x weekly - 3 sets - 10 reps - Standing Isometric Cervical Sidebending with Manual Resistance  - 2 x daily - 7 x weekly - 2 sets - 10 reps - 5 sec hold - Standing Isometric Cervical Flexion with Manual Resistance  - 2 x daily - 7 x weekly - 2 sets - 10 reps - 5 sec hold - Standing Isometric Cervical Extension with Manual Resistance  - 2 x daily - 7 x weekly - 2 sets - 10 reps - 5 sec hold - Standing Isometric Shoulder Abduction with Doorway  - 2 x daily - 7 x weekly - 2 sets - 10 reps - 5 sec hold - Standing Isometric Shoulder Abduction with Doorway - Arm Bent  - 2 x daily - 7 x weekly - 2 sets - 10 reps - 5 sec hold - Seated Shoulder Horizontal Abduction with Resistance  - 1 x daily - 7 x weekly - 3 sets - 10 reps - Seated Single Arm Shoulder External Rotation with Self-Anchored Resistance  - 1 x daily - 7 x weekly - 3 sets - 10 reps - Sit  to Stand with Armchair  - 1 x daily - 7 x weekly - 2 sets - 10 reps - Heel Toe Raises with Counter Support  - 1 x daily - 7 x weekly - 3 sets - 10 reps - Seated Hip Abduction with Resistance  - 1 x daily - 7 x weekly - 3 sets - 10 reps - Seated March with Resistance  - 1 x daily - 7 x weekly - 3 sets - 10 reps -  Standing Gastroc Stretch  - 2 x daily - 7 x weekly - 1 sets - 5-3 reps - 10-30 sec hold - Standing Hip Abduction with Counter Support  - 1 x daily - 7 x weekly - 3 sets - 10 reps - Standing Hip Extension with Counter Support  - 1 x daily - 7 x weekly - 3 sets - 10 reps - Standing Hamstring Curl with Chair Support  - 1 x daily - 7 x weekly - 3 sets - 10 reps - Mini Squat with Counter Support  - 1 x daily - 7 x weekly - 3 sets - 10 reps  ASSESSMENT:  CLINICAL IMPRESSION: Pt continues to tolerate increased standing time during sessions. He continues with decreased neck mobility and difficulty holding head up in standing. He requires min A for LOB when fatigued during gait without AD and PT encouraged pt to always use RW to prevent falls. Pt continues to make slight improvements in NDI and is improving functional strength and mobility. Plan to hold PT after next session until pt follows up with surgeon   OBJECTIVE IMPAIRMENTS: decreased activity tolerance, decreased mobility, decreased ROM, decreased strength, increased muscle spasms, impaired sensation, impaired UE functional use, postural dysfunction, and pain.     GOALS: Goals reviewed with patient? Yes  SHORT TERM GOALS: Target date: 07/01/2023  Pt will be independent in initial HEP Baseline:  Goal status: MET  2.  Pt will improve Lt UE AROM to 90 degrees flexion and 100 degrees abduction Baseline: see above 06/30/23: flexion 108 deg, abd 76 deg Goal status: IN PROGRESS   LONG TERM GOALS: Target date: 09/08/2023  Pt will be independent with advanced HEP Baseline:  Goal status: MET  2.  NDI will improve to <= 16/50 to demo improved functional mobility Baseline: 26/50 07/14/23: 22/50 = 44.0% 08/18/23: 20/50 40% Goal status: IN PROGRESS  3.  Pt will improve Lt UE strength to 4/5 to improve functional activity tolerance Baseline: see above 07/14/23: see above Goal status: IN PROGRESS  4.  Pt will report 50% reduction in numbness in  bilat UEs Baseline:  07/07/23: no change 07/14/23: no change 07/28/23: 10% better 08/18/23: 25% better Goal status: NOT MET  5.  Pt will demo improved LE strength with 5 x STS with UE support <= 15 seconds Baseline:  Goal status: INITIAL  6.  Pt will improve TUG to <= 16 seconds to demo improved gait and balance Baseline:  Goal status: INITIAL   PLAN:  PT FREQUENCY: 2x/week  PT DURATION: 6 weeks  PLANNED INTERVENTIONS: [id number]- PT Re-evaluation, 97110-Therapeutic exercises, 97530- Therapeutic activity, 97112- Neuromuscular re-education, 97535- Self Care, [id number]- Manual therapy, [id number]- Aquatic Therapy, [id number]- Electrical stimulation (unattended), Patient/Family education, Taping, Dry Needling, Cryotherapy, and Moist heat  PLAN FOR NEXT SESSION: Plans for 7/10 to be last visit until after MD appt. Continue to progress standing exercises. Standing LE strength and balance, postural strength   Kristen Fromm, PT 08/18/2023, 12:26 PM

## 2023-08-20 ENCOUNTER — Ambulatory Visit

## 2023-09-02 ENCOUNTER — Other Ambulatory Visit: Payer: Self-pay | Admitting: Family Medicine

## 2023-09-02 DIAGNOSIS — G992 Myelopathy in diseases classified elsewhere: Secondary | ICD-10-CM | POA: Diagnosis not present

## 2023-09-02 DIAGNOSIS — M4802 Spinal stenosis, cervical region: Secondary | ICD-10-CM | POA: Diagnosis not present

## 2023-09-22 ENCOUNTER — Ambulatory Visit: Attending: Neurosurgery | Admitting: Physical Therapy

## 2023-09-22 ENCOUNTER — Encounter: Payer: Self-pay | Admitting: Physical Therapy

## 2023-09-22 DIAGNOSIS — R29898 Other symptoms and signs involving the musculoskeletal system: Secondary | ICD-10-CM | POA: Diagnosis not present

## 2023-09-22 DIAGNOSIS — M6281 Muscle weakness (generalized): Secondary | ICD-10-CM | POA: Insufficient documentation

## 2023-09-22 NOTE — Therapy (Signed)
 OUTPATIENT PHYSICAL THERAPY CERVICAL RE-EVALUATION    Patient Name: Damon Acosta MRN: [id number] DOB:February 23, 1940, 83 y.o., male Today's Date: 09/22/2023 Dates of service: 06/03/23-09/22/23 END OF SESSION:  PT End of Session - 09/22/23 1305     Visit Number 20    Number of Visits 36    Date for PT Re-Evaluation 11/17/23    Authorization Type Medicare    Authorization - Visit Number 20    Progress Note Due on Visit 30    PT Start Time 1145    PT Stop Time 1223    PT Time Calculation (min) 38 min    Activity Tolerance Patient tolerated treatment well    Behavior During Therapy [facility] for tasks assessed/performed           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   CKD (chronic kidney disease)    Complication of anesthesia    patient had dificulty waking up   Coronary artery disease    Diabetes mellitus without complication ([name])    Gout    Hyperlipidemia    Hypertension    Myocardial infarction ([name])    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 OR;  Service: Neurosurgery;  Laterality: N/A;   PROSTATE SURGERY     Patient Active Problem List   Diagnosis Date Noted   Cervical spondylosis with myelopathy and radiculopathy 02/09/2023   Atopic dermatitis 10/28/2022   Throat clearing 09/16/2022   Dyspnea 07/07/2022   Urinary retention 06/04/2022   Cognitive change 06/04/2022   Insomnia 03/17/2022    Callus of foot, right heel 12/11/2021   Cervical cord myelomalacia ([name]) 10/30/2021   AV block, 1st degree 08/14/2021   Sinus bradycardia on ECG 08/14/2021   OSA (obstructive sleep apnea) 08/01/2021   History of MI (myocardial infarction) 10/21/2019   Diversion colitis 10/07/2019   Gastroesophageal reflux disease without esophagitis 10/07/2019   PAF (paroxysmal atrial fibrillation) ([name]) 09/16/2019   Atrial flutter ([name]) 09/15/2019   NSTEMI (non-ST elevated myocardial infarction) ([name]) 09/15/2019   Recurrent major depressive disorder, in partial remission ([name]) 09/15/2019   Coronary artery disease of native artery of native heart with stable angina pectoris ([name]) 09/15/2019   Severe episode of recurrent major depressive disorder, without psychotic features ([name]) 09/15/2019   Asymptomatic PVCs 07/27/2018   CKD (chronic kidney disease) stage 3, GFR 30-59 ml/min ([name]) 04/27/2018   Mild cognitive impairment 04/20/2018   Microcytic anemia 04/20/2018   Primary osteoarthritis of both knees 02/17/2018   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   Type 2 diabetes mellitus with chronic kidney disease, without long-term current use of insulin  ([name]) 01/06/2018   Dyslipidemia associated with type 2 diabetes mellitus ([name]) 01/06/2018   OAB (overactive bladder) 01/06/2018   History of prostate cancer 01/06/2018   Lumbar spinal stenosis 12/15/2017   Colostomy status ([name]) 12/15/2017   Hypertension associated with diabetes ([name]) 12/15/2017  Gout 12/15/2017   History of CVA (cerebrovascular accident) 12/15/2017   Ileostomy in place [facility]) 12/15/2017    PCP: Alvia MART PROVIDER: Victory Gunnels  REFERRING DIAG: cervical spinal stenosis with myelopathy  THERAPY DIAG:  Muscle weakness (generalized)  Other symptoms and signs involving the musculoskeletal system  Rationale for Evaluation and Treatment: Rehabilitation  ONSET DATE: 02/09/24  SUBJECTIVE:                                                                                                                                                                                                          SUBJECTIVE STATEMENT: Patient returns to PT after returning to MD. MD recommended continued physical therapy for continued weakness and neck pain. Pt states his neck always hurts and that his Lt UE continues to be difficult to use due to decreased muscle endurance. He states he continues with numbness in bilat hands. He states the only time his neck doesn't hurt is when he is asleep. He takes pain meds and tylenol  but they don't seem to help. He states he has continued to work on standing and strengthening at home   PERTINENT HISTORY:  Lumbar spinal stenosis CVA CKD NSTEMI  Pt is s/p cervical laminectomy C2-C7 on 02/09/24. Since the surgery he continues with pain in his neck and with Lt UE weakness and bilat UE numbness/tingling. Prior to surgery he used a PFRW for ambulation but he has had a few recent falls and is now using a power w/c for mobility in and out of the house. Pt reports difficulty using Lt UE for functional activities. He reports he drops items often due to numbness in hands. Numbness does not get better or worse, it is just always there.  Hand dominance: Right  PAIN:  Are you having pain? Yes: NPRS scale: 8/10 currently, 9/10 at worst Pain location: neck, LT UE Pain description: sore, ache Aggravating factors: increased use of Lt UE Relieving factors: meds  PRECAUTIONS: Fall  RED FLAGS: None     WEIGHT BEARING RESTRICTIONS: No  FALLS:  Has patient fallen in last 6 months? Yes. Number of falls 2-3  LIVING ENVIRONMENT: Lives with: lives with their family Lives in: House/apartment Stairs: No Has following equipment at home: Environmental consultant - 2 wheeled and Wheelchair (power)  OCCUPATION: retired  PLOF: Independent with basic ADLs, Independent with household mobility with device, and Requires assistive device for  independence  PATIENT GOALS: decrease pain, get Lt UE to work, decrease numbness  NEXT MD VISIT: 09/05/23 [place])  OBJECTIVE:  Note: Objective measures were  completed at Evaluation unless otherwise noted.  DIAGNOSTIC FINDINGS:  None on file since surgery  PATIENT SURVEYS:  NDI 26/50 - 52%  COGNITION: Overall cognitive status: History of cognitive impairments - at baseline  SENSATION: Light touch: Impaired   POSTURE: forward head  PALPATION: Increased mm spasticity Rt > Lt cervical paraspinals Increased tenderness to light touch throughout cervical musculature, UT bilat Some decreased symptoms with manual traction   CERVICAL ROM:   Active ROM A/PROM (deg) eval AROM 07/28/23  Flexion 34 30  Extension 14 pain 10  Right lateral flexion 35 28  Left lateral flexion 25 33  Right rotation 38 55  Left rotation 40 57   (Blank rows = not tested)  UPPER EXTREMITY MMT:  MMT Right eval Left eval Right 07/14/23 Left 07/14/23 Rt/Lt 07/28/23 Rt/Lt 09/22/23  Shoulder flexion 4 3- 4 4- 4-/4- 4/4-  Shoulder extension        Shoulder abduction 4 3- 4 4- 4/4- 4/4-  Shoulder adduction        Shoulder extension        Shoulder internal rotation        Shoulder external rotation        Elbow flexion 4+ 3+ 4+ 4- 4+/4- 4+/4  Elbow extension 4+ 3 4+ 4+ 4+/4- 4+/4  Wrist flexion        Wrist extension        Wrist ulnar deviation        Wrist radial deviation        Wrist pronation        GRIP STRENGTH 50 19 60 53  65/45   (Blank rows = not tested)  UPPER EXTREMITY ROM:  Active ROM Right eval Left eval  Shoulder flexion WFL 60  Shoulder extension    Shoulder abduction WFL 85 pain  Shoulder adduction    Shoulder extension    Shoulder internal rotation    Shoulder external rotation    Elbow flexion    Elbow extension    Wrist flexion    Wrist extension    Wrist ulnar deviation    Wrist radial deviation    Wrist pronation    Wrist supination     (Blank rows = not  tested)  LOWER EXTREMITY MMT:    MMT Right eval Left eval Rt/Lt  09/22/23  Hip flexion 4- 4- 4/3+  Hip extension     Hip abduction     Hip adduction     Hip internal rotation     Hip external rotation     Knee flexion 4 4 4/4-  Knee extension 4- 4- 4/4-  Ankle dorsiflexion 4- 4- 4-/3+  Ankle plantarflexion     Ankle inversion     Ankle eversion      (Blank rows = not tested)  CERVICAL SPECIAL TESTS:  Spurling's test: Positive  5 x STS with UE support = 18.15 seconds (07/28/23), 22.31 seconds (09/22/23) TUG (no AD): 21.84 seconds (07/28/23) 23.21 seconds (09/22/23)   [facility] Adult PT Treatment:                                                DATE: 09/22/23 Therapeutic Exercise: Re-Evaluation (see above) 5 x STS = 22.31 seconds TUG (no AD) = 23.21 seconds  Therapeutic Activity: Standing shoulder Wall slide x 10 with 3 sec hold Standing Hip abd  x 10 with 3 sec hold Standing HS curl x 10 with 3 sec hold Hip flexion seated x 10 3 sec hold  Self Care: Education on continued PT POC and goals, HEP  [facility] Adult PT Treatment:                                                DATE: 08/18/23 Therapeutic Exercise/Activity/NMR: Gait without AD 80' x 3 with CGA - min A to correct LOB Seated Bicep curl to overhead press 1# 2 x 8 Bent arm shoulder abd 1# 2 x 8 Pec stretch with yoga mat behind spine Cervical ROM all directions Standing Hip abd HS curl Heel raises Toe raises Squat Staggered stance squat Sit <> stand focus on eccentric control  Self Care: NDI 20/50  [facility] Adult PT Treatment:                                                DATE: 08/13/2023 Therapeutic Exercise: Standing: Gastroc stretch Heel raises Toe raises Hip abd  Butt kickers  Seated: Pec stretch with bent arm shoulder ER + rolled yoga mat behind spine HS stretch --> foot propped on edge of treadmill Cervical AROM all directions Neuromuscular re-ed: Seated: Horizontal abd red TB + scap retraction Bicep curl  + overhead press with 1#DB --> reps to fatigue Standing: STS with focus on eccentric control --> UE support to stand, no UE support to sit Diagonal pull ups+ YTB opp  Marching + 3#AW  HS curls + 3#AW Therapeutic Activity: Standing: Squats x10 Staggered stance squats x10 each leg Abduction arm raises + 1#DB x10 Walking no AD x 30' (SBA)    [facility] Adult PT Treatment:                                                DATE: 08/11/23 Therapeutic Exercise/Activity: Standing Heel raises x 20 Toe raises x 20  Mini squat x 10 Hip abd red TB Hip ext red TB HS curls red TB Dumbell curl to overhead lift 1# Scap squeeze with tactile cues for chest lift and to reduce cervical flexion Backward shoulder rolls cues for posture Wall push up x 10 Modified plantigrade scap squeeze and chin tuck Seated Overhead UE lift bilat UE --> single UE Sit <> stand focus on chest lift and posture Punch 1# DB Scaption 1# DB L arms x 10 Horizontal abd red TB     PATIENT EDUCATION:  Education details: Updated HEP Person educated: Patient Education method: Explanation, Demonstration, and Handouts Education comprehension: verbalized understanding and returned demonstration  HOME EXERCISE PROGRAM: Access Code: [email or web address] URL: [email or web address] Date: 09/22/2023 Prepared by: Darice Conine  Exercises - Dead Bug  - 1 x daily - 7 x weekly - 3 sets - 10 reps - Shoulder Flexion Wall Slide with Towel  - 1 x daily - 7 x weekly - 1 sets - 10 reps - 5-10 seconds hold - Sit to Stand with Armchair  - 1 x daily - 7 x weekly - 2 sets - 10 reps - Standing Gastroc Stretch  - 2 x  daily - 7 x weekly - 1 sets - 5-3 reps - 10-30 sec hold - Heel Toe Raises with Counter Support  - 1 x daily - 7 x weekly - 1 sets - 10 reps - 3-5 seconds hold - Seated March with Resistance  - 1 x daily - 7 x weekly - 1 sets - 10 reps - 3-5 seconds hold - Standing Hip Abduction with Counter Support  - 1 x daily - 7 x weekly -  1 sets - 10 reps - 3-5 seconds hold - Standing Hip Extension with Counter Support  - 1 x daily - 7 x weekly - 1 sets - 10 reps - 3-5 seconds hold - Standing Hamstring Curl with Chair Support  - 1 x daily - 7 x weekly - 1 sets - 10 reps - 3-5 seconds hold - Mini Squat with Counter Support  - 1 x daily - 7 x weekly - 1 sets - 10 reps - 3-5 seconds hold  ASSESSMENT:  CLINICAL IMPRESSION: Pt returns after holding PT with MD recommendation and referral to continue PT for strengthening. Pt continues with decreased UE and LE strength, noted with deficits in muscular endurance. HEP updated with focus on muscular strength and endurance. Pt will benefit from continued PT services to address deficits and improve functional mobility and independence.  Pt with notable shortness of breath and dyspnea during session today. spO2 86%. No c/o chest pain, dizziness or difficultly breathing. PCP made aware. PT advised pt to schedule an appointment with his provider as soon as possible   OBJECTIVE IMPAIRMENTS: decreased activity tolerance, decreased mobility, decreased ROM, decreased strength, increased muscle spasms, impaired sensation, impaired UE functional use, postural dysfunction, and pain.     GOALS: Goals reviewed with patient? Yes  SHORT TERM GOALS: Target date: 07/01/2023  Pt will be independent in initial HEP Baseline:  Goal status: MET  2.  Pt will improve Lt UE AROM to 90 degrees flexion and 100 degrees abduction Baseline: see above 06/30/23: flexion 108 deg, abd 76 deg Goal status: IN PROGRESS   LONG TERM GOALS: Target date: 11/17/2023    Pt will be independent with advanced HEP Baseline:  Goal status: IN PROGRESS  2.  NDI will improve to <= 16/50 to demo improved functional mobility Baseline: 26/50 07/14/23: 22/50 = 44.0% 08/18/23: 20/50 40% Goal status: IN PROGRESS  3.  Pt will improve Lt UE strength to 4+/5 to improve functional activity tolerance Baseline: see above 07/14/23: see  above Goal status: UPDATED  4.  Pt will report 50% reduction in numbness in bilat UEs Baseline:  07/07/23: no change 07/14/23: no change 07/28/23: 10% better 08/18/23: 25% better Goal status: NOT MET  5.  Pt will demo improved LE strength with 5 x STS with UE support <= 15 seconds Baseline:  Goal status: INITIAL  6.  Pt will improve TUG to <= 16 seconds to demo improved gait and balance Baseline:  Goal status: INITIAL   PLAN:  PT FREQUENCY: 2x/week  PT DURATION: 8 weeks  PLANNED INTERVENTIONS: 97164- PT Re-evaluation, 97110-Therapeutic exercises, 97530- Therapeutic activity, [id number]- Neuromuscular re-education, 97535- Self Care, [id number]- Manual therapy, [id number]- Aquatic Therapy, [id number]- Electrical stimulation (unattended), Patient/Family education, Taping, Dry Needling, Cryotherapy, and Moist heat  PLAN FOR NEXT SESSION: Standing LE strength and balance, postural strength   Alka Falwell, PT 09/22/2023, 1:06 PM

## 2023-09-23 ENCOUNTER — Encounter: Payer: Self-pay | Admitting: Medical-Surgical

## 2023-09-23 ENCOUNTER — Ambulatory Visit (INDEPENDENT_AMBULATORY_CARE_PROVIDER_SITE_OTHER)

## 2023-09-23 ENCOUNTER — Ambulatory Visit (INDEPENDENT_AMBULATORY_CARE_PROVIDER_SITE_OTHER): Admitting: Medical-Surgical

## 2023-09-23 VITALS — BP 114/54 | HR 56 | Resp 20 | Ht 69.0 in | Wt 170.1 lb

## 2023-09-23 DIAGNOSIS — R0600 Dyspnea, unspecified: Secondary | ICD-10-CM | POA: Diagnosis not present

## 2023-09-23 DIAGNOSIS — R0602 Shortness of breath: Secondary | ICD-10-CM | POA: Diagnosis not present

## 2023-09-23 DIAGNOSIS — R0609 Other forms of dyspnea: Secondary | ICD-10-CM | POA: Diagnosis not present

## 2023-09-23 NOTE — Progress Notes (Signed)
 Established patient visit   History of Present Illness   Discussed the use of AI scribe software for clinical note transcription with the patient, who gave verbal consent to proceed.  History of Present Illness   Damon Acosta is an 83 year old male who presents with shortness of breath during physical therapy.  He experiences shortness of breath during physical therapy sessions, which began approximately four to five days ago. The sensation is described as being unable to catch his breath, necessitating rest breaks during activity. There is no chest pain or shortness of breath at rest.  He underwent recent neck surgery and has persistent neck pain post-operation, affecting his ability to continue with physical therapy.  He experiences occasional swelling in his legs, which worsens throughout the day and improves at night. He also has occasional coughing with dark green mucus production. There is no recent fever.  He is currently taking blood pressure medication and monitors his blood pressure at home. He drinks a lot of water daily and avoids caffeine.      Physical Exam   Physical Exam Vitals and nursing note reviewed.  Constitutional:      General: He is not in acute distress.    Appearance: Normal appearance. He is well-developed. He is not ill-appearing.  HENT:     Head: Normocephalic and atraumatic.  Cardiovascular:     Rate and Rhythm: Normal rate and regular rhythm.     Pulses: Normal pulses.     Heart sounds: Normal heart sounds. No murmur heard.    No friction rub. No gallop.  Pulmonary:     Effort: Pulmonary effort is normal. No respiratory distress.     Breath sounds: Normal breath sounds.  Skin:    General: Skin is warm and dry.  Neurological:     Mental Status: He is alert and oriented to person, place, and time.  Psychiatric:        Mood and Affect: Mood normal.        Behavior: Behavior normal.        Thought Content: Thought content normal.         Judgment: Judgment normal.    In office EKG done today showing Sinus bradycardia with 1st degree AV block, normal axis, rate 56.  Assessment & Plan   Assessment and Plan    Dyspnea on exertion Dyspnea on exertion noted during physical therapy. Differential includes hypotension, cardiac or pulmonary issues, or electrolyte imbalance. Possible dehydration or medication-related hypotension considered. - Order chest x-ray. - Order blood work. - Initial BP 87/43, seated. Recheck blood pressure, 114/54. - Advise to monitor blood pressure at home and withhold Losartan  if below 110/70 mmHg. - Advise to increase fluid intake. - Advise to consume salty foods or drinks if blood pressure is low.  Productive cough with green sputum Intermittent productive cough with green sputum. No fever or recent illness reported.  Lower extremity edema Intermittent lower extremity edema, likely due to venous insufficiency. - Advise to elevate legs when sitting. - Advise to monitor sodium intake and maintain hydration.  Chronic neck pain after recent neck surgery Chronic neck pain persists post-surgery, exacerbated by poor posture. - Encourage regular physical therapy. - Advise on maintaining proper posture to alleviate neck pain.  Hypertension, controlled with medication Hypertension controlled with medication. Hypotensive today. Blood pressure monitoring advised, especially if symptoms of hypotension occur. - Advise to monitor blood pressure at home. - Withhold Losartan  if blood pressure  is below 110/70 mmHg.     Follow up   Return in about 2 weeks (around 10/07/2023) for dyspnea and chronic disease follow up with PCP.  __________________________________ Zada FREDRIK Palin, DNP, APRN, FNP-BC Primary Care and Sports Medicine [facility]

## 2023-09-24 ENCOUNTER — Ambulatory Visit: Payer: Self-pay | Admitting: Medical-Surgical

## 2023-09-24 LAB — CBC WITH DIFFERENTIAL/PLATELET
Basophils Absolute: 0 x10E3/uL (ref 0.0–0.2)
Basos: 0 %
EOS (ABSOLUTE): 0.2 x10E3/uL (ref 0.0–0.4)
Eos: 4 %
Hematocrit: 35.6 % — ABNORMAL LOW (ref 37.5–51.0)
Hemoglobin: 10.4 g/dL — ABNORMAL LOW (ref 13.0–17.7)
Immature Grans (Abs): 0 x10E3/uL (ref 0.0–0.1)
Immature Granulocytes: 0 %
Lymphocytes Absolute: 1.3 x10E3/uL (ref 0.7–3.1)
Lymphs: 25 %
MCH: 25.3 pg — ABNORMAL LOW (ref 26.6–33.0)
MCHC: 29.2 g/dL — ABNORMAL LOW (ref 31.5–35.7)
MCV: 87 fL (ref 79–97)
Monocytes Absolute: 0.5 x10E3/uL (ref 0.1–0.9)
Monocytes: 9 %
Neutrophils Absolute: 3.2 x10E3/uL (ref 1.4–7.0)
Neutrophils: 62 %
Platelets: 125 x10E3/uL — ABNORMAL LOW (ref 150–450)
RBC: 4.11 x10E6/uL — ABNORMAL LOW (ref 4.14–5.80)
RDW: 14 % (ref 11.6–15.4)
WBC: 5.2 x10E3/uL (ref 3.4–10.8)

## 2023-09-24 LAB — CMP14+EGFR
ALT: 10 IU/L (ref 0–44)
AST: 14 IU/L (ref 0–40)
Albumin: 4.1 g/dL (ref 3.7–4.7)
Alkaline Phosphatase: 106 IU/L (ref 44–121)
BUN/Creatinine Ratio: 27 — ABNORMAL HIGH (ref 10–24)
BUN: 59 mg/dL — ABNORMAL HIGH (ref 8–27)
Bilirubin Total: 0.3 mg/dL (ref 0.0–1.2)
CO2: 16 mmol/L — ABNORMAL LOW (ref 20–29)
Calcium: 9.6 mg/dL (ref 8.6–10.2)
Chloride: 106 mmol/L (ref 96–106)
Creatinine, Ser: 2.19 mg/dL — ABNORMAL HIGH (ref 0.76–1.27)
Globulin, Total: 2.4 g/dL (ref 1.5–4.5)
Sodium: 136 mmol/L (ref 134–144)
Total Protein: 6.5 g/dL (ref 6.0–8.5)
eGFR: 29 mL/min/1.73 — ABNORMAL LOW (ref >59)

## 2023-09-24 LAB — IRON,TIBC AND FERRITIN PANEL
Ferritin: 353 ng/mL (ref 30–400)
Iron Saturation: 35 % (ref 15–55)
Iron: 75 ug/dL (ref 38–169)
Total Iron Binding Capacity: 213 ug/dL — ABNORMAL LOW (ref 250–450)
UIBC: 138 ug/dL (ref 111–343)

## 2023-09-24 LAB — D-DIMER, QUANTITATIVE: D-DIMER: 0.44 mg{FEU}/L (ref 0.00–0.49)

## 2023-09-24 LAB — TSH: TSH: 2.81 u[IU]/mL (ref 0.450–4.500)

## 2023-09-24 LAB — BRAIN NATRIURETIC PEPTIDE: BNP: 216.9 pg/mL — ABNORMAL HIGH (ref 0.0–100.0)

## 2023-09-28 ENCOUNTER — Telehealth: Payer: Self-pay

## 2023-09-28 NOTE — Telephone Encounter (Signed)
 Copied from CRM #[id number]. Topic: Clinical - Lab/Test Results >> Sep 28, 2023 11:11 AM Gra[facility] ORN wrote: Reason for CRM: Patient wife called in BP readings requested by provider:   Sun: 126/75 p 56 Mon: 130/72 p 60   ----------------------------------------------------------------------- From previous Reason for Contact - Medical Advice: Reason for CRM:

## 2023-09-29 ENCOUNTER — Ambulatory Visit: Payer: Self-pay | Admitting: Physical Therapy

## 2023-09-29 ENCOUNTER — Encounter: Payer: Self-pay | Admitting: Physical Therapy

## 2023-09-29 DIAGNOSIS — R29898 Other symptoms and signs involving the musculoskeletal system: Secondary | ICD-10-CM | POA: Diagnosis not present

## 2023-09-29 DIAGNOSIS — M6281 Muscle weakness (generalized): Secondary | ICD-10-CM

## 2023-09-29 NOTE — Therapy (Signed)
 OUTPATIENT PHYSICAL THERAPY CERVICAL TREATMENT  Patient Name: Damon Acosta MRN: [id number] DOB:Jul 31, 1940, 83 y.o., male Today's Date: 09/29/2023 Dates of service: 06/03/23-09/22/23 END OF SESSION:  PT End of Session - 09/29/23 1225     Visit Number 21    Number of Visits 36    Date for PT Re-Evaluation 11/17/23    Authorization Type Medicare    Authorization - Visit Number 21    Progress Note Due on Visit 30    PT Start Time 1145    PT Stop Time 1224    PT Time Calculation (min) 39 min    Activity Tolerance Patient tolerated treatment well    Behavior During Therapy [facility] for tasks assessed/performed            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   CKD (chronic kidney disease)    Complication of anesthesia    patient had dificulty waking up   Coronary artery disease    Diabetes mellitus without complication ([name])    Gout    Hyperlipidemia    Hypertension    Myocardial infarction ([name])    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   colonic pseudo-obstruction, s/p colectomy with ileostomy 12/2013   Overactive bladder    Sleep apnea    Stroke [facility])    Past Surgical History:  Procedure Laterality Date   CHOLECYSTECTOMY     COLOSTOMY     POSTERIOR CERVICAL LAMINECTOMY N/A 02/09/2023   Procedure: Laminectomy  Cervical two - Cervical seven;  Surgeon: Louis Shove, MD;  Location: [facility] OR;  Service: Neurosurgery;  Laterality: N/A;   PROSTATE SURGERY     Patient Active Problem List   Diagnosis Date Noted   Cervical spondylosis with myelopathy and radiculopathy 02/09/2023   Atopic dermatitis 10/28/2022   Throat clearing 09/16/2022   Dyspnea 07/07/2022   Urinary retention 06/04/2022   Cognitive change 06/04/2022   Insomnia 03/17/2022   Callus  of foot, right heel 12/11/2021   Cervical cord myelomalacia ([name]) 10/30/2021   AV block, 1st degree 08/14/2021   Sinus bradycardia on ECG 08/14/2021   OSA (obstructive sleep apnea) 08/01/2021   History of MI (myocardial infarction) 10/21/2019   Diversion colitis 10/07/2019   Gastroesophageal reflux disease without esophagitis 10/07/2019   PAF (paroxysmal atrial fibrillation) ([name]) 09/16/2019   Atrial flutter ([name]) 09/15/2019   NSTEMI (non-ST elevated myocardial infarction) ([name]) 09/15/2019   Recurrent major depressive disorder, in partial remission ([name]) 09/15/2019   Coronary artery disease of native artery of native heart with stable angina pectoris ([name]) 09/15/2019   Severe episode of recurrent major depressive disorder, without psychotic features ([name]) 09/15/2019   Asymptomatic PVCs 07/27/2018   CKD (chronic kidney disease) stage 3, GFR 30-59 ml/min ([name]) 04/27/2018   Mild cognitive impairment 04/20/2018   Microcytic anemia 04/20/2018   Primary osteoarthritis of both knees 02/17/2018   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   Type 2 diabetes mellitus with chronic kidney disease, without long-term current use of insulin  ([name]) 01/06/2018   Dyslipidemia associated with type 2 diabetes mellitus ([name]) 01/06/2018   OAB (overactive bladder) 01/06/2018   History of prostate cancer 01/06/2018   Lumbar spinal stenosis 12/15/2017   Colostomy status ([name]) 12/15/2017   Hypertension associated with diabetes ([name]) 12/15/2017  Gout 12/15/2017   History of CVA (cerebrovascular accident) 12/15/2017   Ileostomy in place [facility]) 12/15/2017    PCP: Alvia MART PROVIDER: Victory Gunnels  REFERRING DIAG: cervical spinal stenosis with myelopathy  THERAPY DIAG:  Muscle weakness (generalized)  Other symptoms and signs involving the musculoskeletal system  Rationale for Evaluation and Treatment: Rehabilitation  ONSET DATE: 02/09/24  SUBJECTIVE:                                                                                                                                                                                                          SUBJECTIVE STATEMENT: Pt states he is feeling ok. His shortness of breath is better   PERTINENT HISTORY:  Lumbar spinal stenosis CVA CKD NSTEMI  Pt is s/p cervical laminectomy C2-C7 on 02/09/24. Since the surgery he continues with pain in his neck and with Lt UE weakness and bilat UE numbness/tingling. Prior to surgery he used a PFRW for ambulation but he has had a few recent falls and is now using a power w/c for mobility in and out of the house. Pt reports difficulty using Lt UE for functional activities. He reports he drops items often due to numbness in hands. Numbness does not get better or worse, it is just always there.  Hand dominance: Right  PAIN:  Are you having pain? Yes: NPRS scale: 8/10 currently, 9/10 at worst Pain location: neck, LT UE Pain description: sore, ache Aggravating factors: increased use of Lt UE Relieving factors: meds  PRECAUTIONS: Fall  RED FLAGS: None     WEIGHT BEARING RESTRICTIONS: No  FALLS:  Has patient fallen in last 6 months? Yes. Number of falls 2-3  LIVING ENVIRONMENT: Lives with: lives with their family Lives in: House/apartment Stairs: No Has following equipment at home: Environmental consultant - 2 wheeled and Wheelchair (power)  OCCUPATION: retired  PLOF: Independent with basic ADLs, Independent with household mobility with device, and Requires assistive device for independence  PATIENT GOALS: decrease pain, get Lt UE to work, decrease numbness  NEXT MD VISIT: 09/05/23 [place])  OBJECTIVE:  Note: Objective measures were completed at Evaluation unless otherwise noted.  DIAGNOSTIC FINDINGS:  None on file since surgery  PATIENT SURVEYS:  NDI 26/50 - 52%  COGNITION: Overall cognitive status: History of cognitive impairments - at baseline  SENSATION: Light touch: Impaired   POSTURE: forward  head  PALPATION: Increased mm spasticity Rt > Lt cervical paraspinals Increased tenderness to light touch throughout cervical musculature, UT bilat Some decreased symptoms with manual traction   CERVICAL ROM:  Active ROM A/PROM (deg) eval AROM 07/28/23  Flexion 34 30  Extension 14 pain 10  Right lateral flexion 35 28  Left lateral flexion 25 33  Right rotation 38 55  Left rotation 40 57   (Blank rows = not tested)  UPPER EXTREMITY MMT:  MMT Right eval Left eval Right 07/14/23 Left 07/14/23 Rt/Lt 07/28/23 Rt/Lt 09/22/23  Shoulder flexion 4 3- 4 4- 4-/4- 4/4-  Shoulder extension        Shoulder abduction 4 3- 4 4- 4/4- 4/4-  Shoulder adduction        Shoulder extension        Shoulder internal rotation        Shoulder external rotation        Elbow flexion 4+ 3+ 4+ 4- 4+/4- 4+/4  Elbow extension 4+ 3 4+ 4+ 4+/4- 4+/4  Wrist flexion        Wrist extension        Wrist ulnar deviation        Wrist radial deviation        Wrist pronation        GRIP STRENGTH 50 19 60 53  65/45   (Blank rows = not tested)  UPPER EXTREMITY ROM:  Active ROM Right eval Left eval  Shoulder flexion WFL 60  Shoulder extension    Shoulder abduction WFL 85 pain  Shoulder adduction    Shoulder extension    Shoulder internal rotation    Shoulder external rotation    Elbow flexion    Elbow extension    Wrist flexion    Wrist extension    Wrist ulnar deviation    Wrist radial deviation    Wrist pronation    Wrist supination     (Blank rows = not tested)  LOWER EXTREMITY MMT:    MMT Right eval Left eval Rt/Lt  09/22/23  Hip flexion 4- 4- 4/3+  Hip extension     Hip abduction     Hip adduction     Hip internal rotation     Hip external rotation     Knee flexion 4 4 4/4-  Knee extension 4- 4- 4/4-  Ankle dorsiflexion 4- 4- 4-/3+  Ankle plantarflexion     Ankle inversion     Ankle eversion      (Blank rows = not tested)  CERVICAL SPECIAL TESTS:  Spurling's test: Positive  5 x  STS with UE support = 18.15 seconds (07/28/23), 22.31 seconds (09/22/23) TUG (no AD): 21.84 seconds (07/28/23) 23.21 seconds (09/22/23)   [facility] Adult PT Treatment:                                                DATE: 09/29/23 Therapeutic Exercise/Activity: Standing Heel raise 12 x 3 sec hold HS curl 10 x 3 sec hold Hip abd 10 x 3 sec hold Hip ext 10 x 3 sec hold March 10 x 3 sec hold With back against wall: pec stretch, cervical retraction UBE x 2 min level 1 Seated Overhead press 10 x 3 sec hold Bent arm shoulder abd 10 x 3 sec hold Pec stretch   [facility] Adult PT Treatment:  DATE: 09/22/23 Therapeutic Exercise: Re-Evaluation (see above) 5 x STS = 22.31 seconds TUG (no AD) = 23.21 seconds  Therapeutic Activity: Standing shoulder Wall slide x 10 with 3 sec hold Standing Hip abd x 10 with 3 sec hold Standing HS curl x 10 with 3 sec hold Hip flexion seated x 10 3 sec hold  Self Care: Education on continued PT POC and goals, HEP  [facility] Adult PT Treatment:                                                DATE: 08/18/23 Therapeutic Exercise/Activity/NMR: Gait without AD 80' x 3 with CGA - min A to correct LOB Seated Bicep curl to overhead press 1# 2 x 8 Bent arm shoulder abd 1# 2 x 8 Pec stretch with yoga mat behind spine Cervical ROM all directions Standing Hip abd HS curl Heel raises Toe raises Squat Staggered stance squat Sit <> stand focus on eccentric control  Self Care: NDI 20/50  [facility] Adult PT Treatment:                                                DATE: 08/13/2023 Therapeutic Exercise: Standing: Gastroc stretch Heel raises Toe raises Hip abd  Butt kickers  Seated: Pec stretch with bent arm shoulder ER + rolled yoga mat behind spine HS stretch --> foot propped on edge of treadmill Cervical AROM all directions Neuromuscular re-ed: Seated: Horizontal abd red TB + scap retraction Bicep curl + overhead press with 1#DB  --> reps to fatigue Standing: STS with focus on eccentric control --> UE support to stand, no UE support to sit Diagonal pull ups+ YTB opp  Marching + 3#AW  HS curls + 3#AW Therapeutic Activity: Standing: Squats x10 Staggered stance squats x10 each leg Abduction arm raises + 1#DB x10 Walking no AD x 30' (SBA)   PATIENT EDUCATION:  Education details: Updated HEP Person educated: Patient Education method: Explanation, Demonstration, and Handouts Education comprehension: verbalized understanding and returned demonstration  HOME EXERCISE PROGRAM: Access Code: [email or web address] URL: [email or web address] Date: 09/22/2023 Prepared by: Darice Conine  Exercises - Dead Bug  - 1 x daily - 7 x weekly - 3 sets - 10 reps - Shoulder Flexion Wall Slide with Towel  - 1 x daily - 7 x weekly - 1 sets - 10 reps - 5-10 seconds hold - Sit to Stand with Armchair  - 1 x daily - 7 x weekly - 2 sets - 10 reps - Standing Gastroc Stretch  - 2 x daily - 7 x weekly - 1 sets - 5-3 reps - 10-30 sec hold - Heel Toe Raises with Counter Support  - 1 x daily - 7 x weekly - 1 sets - 10 reps - 3-5 seconds hold - Seated March with Resistance  - 1 x daily - 7 x weekly - 1 sets - 10 reps - 3-5 seconds hold - Standing Hip Abduction with Counter Support  - 1 x daily - 7 x weekly - 1 sets - 10 reps - 3-5 seconds hold - Standing Hip Extension with Counter Support  - 1 x daily - 7 x weekly - 1 sets - 10 reps - 3-5 seconds hold - Standing Hamstring  Curl with Chair Support  - 1 x daily - 7 x weekly - 1 sets - 10 reps - 3-5 seconds hold - Mini Squat with Counter Support  - 1 x daily - 7 x weekly - 1 sets - 10 reps - 3-5 seconds hold  ASSESSMENT:  CLINICAL IMPRESSION: Continued to focus on strength and endurance training. Most of session performed in standing with seated rest breaks as needed. Pt continues with noted decreased endurance in postural and LE muscles and will continue to benefit from continued strength  and endurance training   OBJECTIVE IMPAIRMENTS: decreased activity tolerance, decreased mobility, decreased ROM, decreased strength, increased muscle spasms, impaired sensation, impaired UE functional use, postural dysfunction, and pain.     GOALS: Goals reviewed with patient? Yes  SHORT TERM GOALS: Target date: 07/01/2023  Pt will be independent in initial HEP Baseline:  Goal status: MET  2.  Pt will improve Lt UE AROM to 90 degrees flexion and 100 degrees abduction Baseline: see above 06/30/23: flexion 108 deg, abd 76 deg Goal status: IN PROGRESS   LONG TERM GOALS: Target date: 11/17/2023    Pt will be independent with advanced HEP Baseline:  Goal status: IN PROGRESS  2.  NDI will improve to <= 16/50 to demo improved functional mobility Baseline: 26/50 07/14/23: 22/50 = 44.0% 08/18/23: 20/50 40% Goal status: IN PROGRESS  3.  Pt will improve Lt UE strength to 4+/5 to improve functional activity tolerance Baseline: see above 07/14/23: see above Goal status: UPDATED  4.  Pt will report 50% reduction in numbness in bilat UEs Baseline:  07/07/23: no change 07/14/23: no change 07/28/23: 10% better 08/18/23: 25% better Goal status: NOT MET  5.  Pt will demo improved LE strength with 5 x STS with UE support <= 15 seconds Baseline:  Goal status: INITIAL  6.  Pt will improve TUG to <= 16 seconds to demo improved gait and balance Baseline:  Goal status: INITIAL   PLAN:  PT FREQUENCY: 2x/week  PT DURATION: 8 weeks  PLANNED INTERVENTIONS: [id number]- PT Re-evaluation, 97110-Therapeutic exercises, 97530- Therapeutic activity, [id number]- Neuromuscular re-education, 97535- Self Care, [id number]- Manual therapy, [id number]- Aquatic Therapy, [id number]- Electrical stimulation (unattended), Patient/Family education, Taping, Dry Needling, Cryotherapy, and Moist heat  PLAN FOR NEXT SESSION: UPDATE HEP Standing LE strength and balance, postural strength   Rueben Kassim, PT 09/29/2023, 12:25 PM

## 2023-10-01 ENCOUNTER — Other Ambulatory Visit: Payer: Self-pay | Admitting: Family Medicine

## 2023-10-01 DIAGNOSIS — Z8673 Personal history of transient ischemic attack (TIA), and cerebral infarction without residual deficits: Secondary | ICD-10-CM

## 2023-10-01 NOTE — Telephone Encounter (Signed)
 Patient informed.

## 2023-10-04 ENCOUNTER — Other Ambulatory Visit: Payer: Self-pay | Admitting: Family Medicine

## 2023-10-06 ENCOUNTER — Ambulatory Visit: Payer: Self-pay | Admitting: Physical Therapy

## 2023-10-06 ENCOUNTER — Encounter: Payer: Self-pay | Admitting: Physical Therapy

## 2023-10-06 DIAGNOSIS — M6281 Muscle weakness (generalized): Secondary | ICD-10-CM | POA: Diagnosis not present

## 2023-10-06 DIAGNOSIS — R29898 Other symptoms and signs involving the musculoskeletal system: Secondary | ICD-10-CM

## 2023-10-06 NOTE — Therapy (Signed)
 OUTPATIENT PHYSICAL THERAPY CERVICAL TREATMENT  Patient Name: Damon Acosta MRN: [id number] DOB:[birth year], 83 y.o., male Today's Date: 10/06/2023 Dates of service: 06/03/23-09/22/23 END OF SESSION:  PT End of Session - 10/06/23 1228     Visit Number 22    Number of Visits 36    Date for PT Re-Evaluation 11/17/23    Authorization Type Medicare    Authorization - Visit Number 22    Progress Note Due on Visit 30    PT Start Time 1145    PT Stop Time 1228    PT Time Calculation (min) 43 min    Activity Tolerance Patient tolerated treatment well    Behavior During Therapy [facility] for tasks assessed/performed             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   CKD (chronic kidney disease)    Complication of anesthesia    patient had dificulty waking up   Coronary artery disease    Diabetes mellitus without complication ([name])    Gout    Hyperlipidemia    Hypertension    Myocardial infarction ([name])    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   colonic pseudo-obstruction, s/p colectomy with ileostomy 12/2013   Overactive bladder    Sleep apnea    Stroke [facility])    Past Surgical History:  Procedure Laterality Date   CHOLECYSTECTOMY     COLOSTOMY     POSTERIOR CERVICAL LAMINECTOMY N/A 02/09/2023   Procedure: Laminectomy  Cervical two - Cervical seven;  Surgeon: Louis Shove, MD;  Location: [facility] OR;  Service: Neurosurgery;  Laterality: N/A;   PROSTATE SURGERY     Patient Active Problem List   Diagnosis Date Noted   Cervical spondylosis with myelopathy and radiculopathy 02/09/2023   Atopic dermatitis 10/28/2022   Throat clearing 09/16/2022   Dyspnea 07/07/2022   Urinary retention 06/04/2022   Cognitive change 06/04/2022   Insomnia 03/17/2022    Callus of foot, right heel 12/11/2021   Cervical cord myelomalacia ([name]) 10/30/2021   AV block, 1st degree 08/14/2021   Sinus bradycardia on ECG 08/14/2021   OSA (obstructive sleep apnea) 08/01/2021   History of MI (myocardial infarction) 10/21/2019   Diversion colitis 10/07/2019   Gastroesophageal reflux disease without esophagitis 10/07/2019   PAF (paroxysmal atrial fibrillation) ([name]) 09/16/2019   Atrial flutter ([name]) 09/15/2019   NSTEMI (non-ST elevated myocardial infarction) ([name]) 09/15/2019   Recurrent major depressive disorder, in partial remission ([name]) 09/15/2019   Coronary artery disease of native artery of native heart with stable angina pectoris ([name]) 09/15/2019   Severe episode of recurrent major depressive disorder, without psychotic features ([name]) 09/15/2019   Asymptomatic PVCs 07/27/2018   CKD (chronic kidney disease) stage 3, GFR 30-59 ml/min ([name]) 04/27/2018   Mild cognitive impairment 04/20/2018   Microcytic anemia 04/20/2018   Primary osteoarthritis of both knees 02/17/2018   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   Type 2 diabetes mellitus with chronic kidney disease, without long-term current use of insulin  ([name]) 01/06/2018   Dyslipidemia associated with type 2 diabetes mellitus ([name]) 01/06/2018   OAB (overactive bladder) 01/06/2018   History of prostate cancer 01/06/2018   Lumbar spinal stenosis 12/15/2017   Colostomy status ([name]) 12/15/2017   Hypertension associated with diabetes ([name]) 12/15/2017  Gout 12/15/2017   History of CVA (cerebrovascular accident) 12/15/2017   Ileostomy in place [facility]) 12/15/2017    PCP: Alvia MART PROVIDER: Victory Gunnels  REFERRING DIAG: cervical spinal stenosis with myelopathy  THERAPY DIAG:  Muscle weakness (generalized)  Other symptoms and signs involving the musculoskeletal system  Rationale for Evaluation and Treatment: Rehabilitation  ONSET DATE: 02/09/24  SUBJECTIVE:                                                                                                                                                                                                          SUBJECTIVE STATEMENT: Pt with no new complaints. He states he has been exercising at home   PERTINENT HISTORY:  Lumbar spinal stenosis CVA CKD NSTEMI  Pt is s/p cervical laminectomy C2-C7 on 02/09/24. Since the surgery he continues with pain in his neck and with Lt UE weakness and bilat UE numbness/tingling. Prior to surgery he used a PFRW for ambulation but he has had a few recent falls and is now using a power w/c for mobility in and out of the house. Pt reports difficulty using Lt UE for functional activities. He reports he drops items often due to numbness in hands. Numbness does not get better or worse, it is just always there.  Hand dominance: Right  PAIN:  Are you having pain? Yes: NPRS scale: 8/10 currently, 9/10 at worst Pain location: neck, LT UE Pain description: sore, ache Aggravating factors: increased use of Lt UE Relieving factors: meds  PRECAUTIONS: Fall  RED FLAGS: None     WEIGHT BEARING RESTRICTIONS: No  FALLS:  Has patient fallen in last 6 months? Yes. Number of falls 2-3  LIVING ENVIRONMENT: Lives with: lives with their family Lives in: House/apartment Stairs: No Has following equipment at home: Environmental consultant - 2 wheeled and Wheelchair (power)  OCCUPATION: retired  PLOF: Independent with basic ADLs, Independent with household mobility with device, and Requires assistive device for independence  PATIENT GOALS: decrease pain, get Lt UE to work, decrease numbness  NEXT MD VISIT: 09/05/23 [place])  OBJECTIVE:  Note: Objective measures were completed at Evaluation unless otherwise noted.  DIAGNOSTIC FINDINGS:  None on file since surgery  PATIENT SURVEYS:  NDI 26/50 - 52%  COGNITION: Overall cognitive status: History of cognitive impairments - at baseline  SENSATION: Light touch: Impaired   POSTURE:  forward head  PALPATION: Increased mm spasticity Rt > Lt cervical paraspinals Increased tenderness to light touch throughout cervical musculature, UT bilat Some decreased symptoms with manual traction   CERVICAL ROM:  Active ROM A/PROM (deg) eval AROM 07/28/23  Flexion 34 30  Extension 14 pain 10  Right lateral flexion 35 28  Left lateral flexion 25 33  Right rotation 38 55  Left rotation 40 57   (Blank rows = not tested)  UPPER EXTREMITY MMT:  MMT Right eval Left eval Right 07/14/23 Left 07/14/23 Rt/Lt 07/28/23 Rt/Lt 09/22/23  Shoulder flexion 4 3- 4 4- 4-/4- 4/4-  Shoulder extension        Shoulder abduction 4 3- 4 4- 4/4- 4/4-  Shoulder adduction        Shoulder extension        Shoulder internal rotation        Shoulder external rotation        Elbow flexion 4+ 3+ 4+ 4- 4+/4- 4+/4  Elbow extension 4+ 3 4+ 4+ 4+/4- 4+/4  Wrist flexion        Wrist extension        Wrist ulnar deviation        Wrist radial deviation        Wrist pronation        GRIP STRENGTH 50 19 60 53  65/45   (Blank rows = not tested)  UPPER EXTREMITY ROM:  Active ROM Right eval Left eval  Shoulder flexion WFL 60  Shoulder extension    Shoulder abduction WFL 85 pain  Shoulder adduction    Shoulder extension    Shoulder internal rotation    Shoulder external rotation    Elbow flexion    Elbow extension    Wrist flexion    Wrist extension    Wrist ulnar deviation    Wrist radial deviation    Wrist pronation    Wrist supination     (Blank rows = not tested)  LOWER EXTREMITY MMT:    MMT Right eval Left eval Rt/Lt  09/22/23  Hip flexion 4- 4- 4/3+  Hip extension     Hip abduction     Hip adduction     Hip internal rotation     Hip external rotation     Knee flexion 4 4 4/4-  Knee extension 4- 4- 4/4-  Ankle dorsiflexion 4- 4- 4-/3+  Ankle plantarflexion     Ankle inversion     Ankle eversion      (Blank rows = not tested)  CERVICAL SPECIAL TESTS:  Spurling's test:  Positive  5 x STS with UE support = 18.15 seconds (07/28/23), 22.31 seconds (09/22/23) TUG (no AD): 21.84 seconds (07/28/23) 23.21 seconds (09/22/23)   [facility] Adult PT Treatment:                                                DATE: 10/06/23 Therapeutic Exercise/Activity: Standing UBE x 2 min alt fwd/bkwd L1 Resisted walking red power band fwd/bkwd Sidestep at counter Mini squat 10 x 3 sec hold March 10 x 3 sec hold Heel raise 10 x 3 sec hold Sit <> stand 2 x 5 Seated Overhead press with dowel 10 x 3 sec hold Bent arm shoulder abd 1# 10 x 3 sec hold Backward shoulder rolls LAQ red TB 10 x 3 sec hold  [facility] Adult PT Treatment:  DATE: 09/29/23 Therapeutic Exercise/Activity: Standing Heel raise 12 x 3 sec hold HS curl 10 x 3 sec hold Hip abd 10 x 3 sec hold Hip ext 10 x 3 sec hold March 10 x 3 sec hold With back against wall: pec stretch, cervical retraction UBE x 2 min level 1 Seated Overhead press 10 x 3 sec hold Bent arm shoulder abd 10 x 3 sec hold Pec stretch   [facility] Adult PT Treatment:                                                DATE: 09/22/23 Therapeutic Exercise: Re-Evaluation (see above) 5 x STS = 22.31 seconds TUG (no AD) = 23.21 seconds  Therapeutic Activity: Standing shoulder Wall slide x 10 with 3 sec hold Standing Hip abd x 10 with 3 sec hold Standing HS curl x 10 with 3 sec hold Hip flexion seated x 10 3 sec hold  Self Care: Education on continued PT POC and goals, HEP  [facility] Adult PT Treatment:                                                DATE: 08/18/23 Therapeutic Exercise/Activity/NMR: Gait without AD 80' x 3 with CGA - min A to correct LOB Seated Bicep curl to overhead press 1# 2 x 8 Bent arm shoulder abd 1# 2 x 8 Pec stretch with yoga mat behind spine Cervical ROM all directions Standing Hip abd HS curl Heel raises Toe raises Squat Staggered stance squat Sit <> stand focus on eccentric  control  Self Care: NDI 20/50    PATIENT EDUCATION:  Education details: Updated HEP Person educated: Patient Education method: Explanation, Demonstration, and Handouts Education comprehension: verbalized understanding and returned demonstration  HOME EXERCISE PROGRAM: Access Code: [email or web address] URL: [email or web address] Date: 09/22/2023 Prepared by: Darice Conine  Exercises - Dead Bug  - 1 x daily - 7 x weekly - 3 sets - 10 reps - Shoulder Flexion Wall Slide with Towel  - 1 x daily - 7 x weekly - 1 sets - 10 reps - 5-10 seconds hold - Sit to Stand with Armchair  - 1 x daily - 7 x weekly - 2 sets - 10 reps - Standing Gastroc Stretch  - 2 x daily - 7 x weekly - 1 sets - 5-3 reps - 10-30 sec hold - Heel Toe Raises with Counter Support  - 1 x daily - 7 x weekly - 1 sets - 10 reps - 3-5 seconds hold - Seated March with Resistance  - 1 x daily - 7 x weekly - 1 sets - 10 reps - 3-5 seconds hold - Standing Hip Abduction with Counter Support  - 1 x daily - 7 x weekly - 1 sets - 10 reps - 3-5 seconds hold - Standing Hip Extension with Counter Support  - 1 x daily - 7 x weekly - 1 sets - 10 reps - 3-5 seconds hold - Standing Hamstring Curl with Chair Support  - 1 x daily - 7 x weekly - 1 sets - 10 reps - 3-5 seconds hold - Mini Squat with Counter Support  - 1 x daily - 7 x weekly - 1 sets - 10 reps -  3-5 seconds hold  ASSESSMENT:  CLINICAL IMPRESSION: Pt with good tolerance to addition of resisted walking today. He continues with decreased muscle endurance in UEs > LEs. He is making progress with standing tolerance but continues with neck pain   OBJECTIVE IMPAIRMENTS: decreased activity tolerance, decreased mobility, decreased ROM, decreased strength, increased muscle spasms, impaired sensation, impaired UE functional use, postural dysfunction, and pain.     GOALS: Goals reviewed with patient? Yes  SHORT TERM GOALS: Target date: 07/01/2023  Pt will be independent in  initial HEP Baseline:  Goal status: MET  2.  Pt will improve Lt UE AROM to 90 degrees flexion and 100 degrees abduction Baseline: see above 06/30/23: flexion 108 deg, abd 76 deg Goal status: IN PROGRESS   LONG TERM GOALS: Target date: 11/17/2023    Pt will be independent with advanced HEP Baseline:  Goal status: IN PROGRESS  2.  NDI will improve to <= 16/50 to demo improved functional mobility Baseline: 26/50 07/14/23: 22/50 = 44.0% 08/18/23: 20/50 40% Goal status: IN PROGRESS  3.  Pt will improve Lt UE strength to 4+/5 to improve functional activity tolerance Baseline: see above 07/14/23: see above Goal status: UPDATED  4.  Pt will report 50% reduction in numbness in bilat UEs Baseline:  07/07/23: no change 07/14/23: no change 07/28/23: 10% better 08/18/23: 25% better Goal status: NOT MET  5.  Pt will demo improved LE strength with 5 x STS with UE support <= 15 seconds Baseline:  Goal status: INITIAL  6.  Pt will improve TUG to <= 16 seconds to demo improved gait and balance Baseline:  Goal status: INITIAL   PLAN:  PT FREQUENCY: 2x/week  PT DURATION: 8 weeks  PLANNED INTERVENTIONS: [id number]- PT Re-evaluation, 97110-Therapeutic exercises, 97530- Therapeutic activity, 97112- Neuromuscular re-education, 97535- Self Care, [id number]- Manual therapy, [id number]- Aquatic Therapy, [id number]- Electrical stimulation (unattended), Patient/Family education, Taping, Dry Needling, Cryotherapy, and Moist heat  PLAN FOR NEXT SESSION: Standing LE strength and balance, postural strength   Cornie Mccomber, PT 10/06/2023, 12:29 PM

## 2023-10-13 ENCOUNTER — Encounter: Payer: Self-pay | Admitting: Physical Therapy

## 2023-10-13 ENCOUNTER — Encounter: Payer: Self-pay | Admitting: Sports Medicine

## 2023-10-13 ENCOUNTER — Encounter: Payer: Self-pay | Admitting: Family Medicine

## 2023-10-13 ENCOUNTER — Ambulatory Visit: Attending: Neurosurgery | Admitting: Physical Therapy

## 2023-10-13 ENCOUNTER — Ambulatory Visit: Admitting: Family Medicine

## 2023-10-13 VITALS — BP 112/63 | HR 87 | Ht 69.0 in | Wt 172.0 lb

## 2023-10-13 DIAGNOSIS — E1169 Type 2 diabetes mellitus with other specified complication: Secondary | ICD-10-CM | POA: Diagnosis not present

## 2023-10-13 DIAGNOSIS — R29898 Other symptoms and signs involving the musculoskeletal system: Secondary | ICD-10-CM | POA: Diagnosis not present

## 2023-10-13 DIAGNOSIS — D649 Anemia, unspecified: Secondary | ICD-10-CM

## 2023-10-13 DIAGNOSIS — E785 Hyperlipidemia, unspecified: Secondary | ICD-10-CM | POA: Diagnosis not present

## 2023-10-13 DIAGNOSIS — E1159 Type 2 diabetes mellitus with other circulatory complications: Secondary | ICD-10-CM | POA: Diagnosis not present

## 2023-10-13 DIAGNOSIS — I152 Hypertension secondary to endocrine disorders: Secondary | ICD-10-CM

## 2023-10-13 DIAGNOSIS — R0600 Dyspnea, unspecified: Secondary | ICD-10-CM

## 2023-10-13 DIAGNOSIS — F332 Major depressive disorder, recurrent severe without psychotic features: Secondary | ICD-10-CM | POA: Diagnosis not present

## 2023-10-13 DIAGNOSIS — Z8739 Personal history of other diseases of the musculoskeletal system and connective tissue: Secondary | ICD-10-CM | POA: Diagnosis not present

## 2023-10-13 DIAGNOSIS — M6281 Muscle weakness (generalized): Secondary | ICD-10-CM | POA: Diagnosis not present

## 2023-10-13 DIAGNOSIS — I480 Paroxysmal atrial fibrillation: Secondary | ICD-10-CM

## 2023-10-13 DIAGNOSIS — E1122 Type 2 diabetes mellitus with diabetic chronic kidney disease: Secondary | ICD-10-CM | POA: Diagnosis not present

## 2023-10-13 DIAGNOSIS — N1832 Chronic kidney disease, stage 3b: Secondary | ICD-10-CM

## 2023-10-13 DIAGNOSIS — Z8673 Personal history of transient ischemic attack (TIA), and cerebral infarction without residual deficits: Secondary | ICD-10-CM | POA: Diagnosis not present

## 2023-10-13 MED ORDER — ATORVASTATIN CALCIUM 20 MG PO TABS: 20.0000 mg | ORAL_TABLET | Freq: Every day | ORAL | 3 refills | Status: AC

## 2023-10-13 MED ORDER — LINAGLIPTIN 5 MG PO TABS: 5.0000 mg | ORAL_TABLET | Freq: Every day | ORAL | 1 refills | Status: AC

## 2023-10-13 MED ORDER — METHOCARBAMOL 500 MG PO TABS: 500.0000 mg | ORAL_TABLET | Freq: Three times a day (TID) | ORAL | 0 refills | Status: DC

## 2023-10-13 MED ORDER — LOSARTAN POTASSIUM 25 MG PO TABS: 25.0000 mg | ORAL_TABLET | Freq: Every day | ORAL | 3 refills | Status: AC

## 2023-10-13 MED ORDER — METOPROLOL SUCCINATE ER 25 MG PO TB24: 12.5000 mg | ORAL_TABLET | Freq: Every day | ORAL | 3 refills | Status: AC

## 2023-10-13 MED ORDER — ALLOPURINOL 100 MG PO TABS: 100.0000 mg | ORAL_TABLET | Freq: Every day | ORAL | 3 refills | Status: AC

## 2023-10-13 MED ORDER — ESCITALOPRAM OXALATE 10 MG PO TABS
10.0000 mg | ORAL_TABLET | Freq: Every day | ORAL | 0 refills | Status: DC
Start: 2023-10-13 — End: 2023-10-27

## 2023-10-13 NOTE — Patient Instructions (Signed)
 Try robaxin  500mg  up to three times per day for muscle spasm.  Try heat to neck.

## 2023-10-13 NOTE — Therapy (Signed)
 OUTPATIENT PHYSICAL THERAPY CERVICAL TREATMENT  Patient Name: Damon Acosta MRN: [id number] DOB:Oct 31, 1940, 83 y.o., male Today's Date: 10/13/2023  END OF SESSION:  PT End of Session - 10/13/23 1356     Visit Number 23    Number of Visits 36    Date for PT Re-Evaluation 11/17/23    Authorization Type Medicare    Authorization - Visit Number 23    Progress Note Due on Visit 30    PT Start Time 1315    PT Stop Time 1355    PT Time Calculation (min) 40 min    Activity Tolerance Patient tolerated treatment well    Behavior During Therapy [facility] for tasks assessed/performed              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   CKD (chronic kidney disease)    Complication of anesthesia    patient had dificulty waking up   Coronary artery disease    Diabetes mellitus without complication ([name])    Gout    Hyperlipidemia    Hypertension    Myocardial infarction ([name])    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   colonic pseudo-obstruction, s/p colectomy with ileostomy 12/2013   Overactive bladder    Sleep apnea    Stroke [facility])    Past Surgical History:  Procedure Laterality Date   CHOLECYSTECTOMY     COLOSTOMY     POSTERIOR CERVICAL LAMINECTOMY N/A 02/09/2023   Procedure: Laminectomy  Cervical two - Cervical seven;  Surgeon: Louis Shove, MD;  Location: [facility] OR;  Service: Neurosurgery;  Laterality: N/A;   PROSTATE SURGERY     Patient Active Problem List   Diagnosis Date Noted   Cervical spondylosis with myelopathy and radiculopathy 02/09/2023   Atopic dermatitis 10/28/2022   Throat clearing 09/16/2022   Dyspnea 07/07/2022   Urinary retention 06/04/2022   Cognitive change 06/04/2022   Insomnia 03/17/2022   Callus of foot, right heel  12/11/2021   Cervical cord myelomalacia ([name]) 10/30/2021   AV block, 1st degree 08/14/2021   Sinus bradycardia on ECG 08/14/2021   OSA (obstructive sleep apnea) 08/01/2021   History of MI (myocardial infarction) 10/21/2019   Diversion colitis 10/07/2019   Gastroesophageal reflux disease without esophagitis 10/07/2019   PAF (paroxysmal atrial fibrillation) ([name]) 09/16/2019   Atrial flutter ([name]) 09/15/2019   NSTEMI (non-ST elevated myocardial infarction) ([name]) 09/15/2019   Recurrent major depressive disorder, in partial remission ([name]) 09/15/2019   Coronary artery disease of native artery of native heart with stable angina pectoris ([name]) 09/15/2019   Severe episode of recurrent major depressive disorder, without psychotic features ([name]) 09/15/2019   Asymptomatic PVCs 07/27/2018   CKD (chronic kidney disease) stage 3, GFR 30-59 ml/min ([name]) 04/27/2018   Mild cognitive impairment 04/20/2018   Microcytic anemia 04/20/2018   Primary osteoarthritis of both knees 02/17/2018   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) 12/15/2017    PCP: Alvia MART PROVIDER: Victory Gunnels  REFERRING DIAG: cervical spinal stenosis with myelopathy  THERAPY DIAG:  Muscle weakness (generalized)  Other symptoms and signs involving the musculoskeletal system  Rationale for Evaluation and Treatment: Rehabilitation  ONSET DATE: 02/09/24  SUBJECTIVE:                                                                                                                                                                                                          SUBJECTIVE STATEMENT: Pt states my neck still really hurts. He states he has been exercising at home but that he is still limited by pain. He bought a Chiropractor from [employer] but it was too intense and he was sore for 2 days after using it   PERTINENT HISTORY:  Lumbar spinal stenosis CVA CKD NSTEMI  Pt is s/p cervical laminectomy C2-C7 on 02/09/24. Since the surgery he continues with pain in his neck and with Lt UE weakness and bilat UE numbness/tingling. Prior to surgery he used a PFRW for ambulation but he has had a few recent falls and is now using a power w/c for mobility in and out of the house. Pt reports difficulty using Lt UE for functional activities. He reports he drops items often due to numbness in hands. Numbness does not get better or worse, it is just always there.  Hand dominance: Right  PAIN:  Are you having pain? Yes: NPRS scale: 8/10 currently, 9/10 at worst Pain location: neck, LT UE Pain description: sore, ache Aggravating factors: increased use of Lt UE Relieving factors: meds  PRECAUTIONS: Fall  RED FLAGS: None     WEIGHT BEARING RESTRICTIONS: No  FALLS:  Has patient fallen in last 6 months? Yes. Number of falls 2-3  LIVING ENVIRONMENT: Lives with: lives with their family Lives in: House/apartment Stairs: No Has following equipment at home: Environmental consultant - 2 wheeled and Wheelchair (power)  OCCUPATION: retired  PLOF: Independent with basic ADLs, Independent with household mobility with device, and Requires assistive device for independence  PATIENT GOALS: decrease pain, get Lt UE to work, decrease numbness  NEXT MD VISIT: 09/05/23 [facility])  OBJECTIVE:  Note: Objective measures were completed at Evaluation unless otherwise noted.  DIAGNOSTIC FINDINGS:  None on file since surgery  PATIENT SURVEYS:  NDI 26/50 -  52%  COGNITION: Overall cognitive status: History of cognitive impairments - at baseline  SENSATION: Light touch: Impaired   POSTURE: forward head  PALPATION: Increased mm spasticity Rt > Lt cervical paraspinals Increased tenderness to light touch throughout cervical musculature, UT bilat Some decreased symptoms with manual traction   CERVICAL ROM:   Active ROM A/PROM (deg) eval AROM 07/28/23  Flexion 34 30  Extension 14 pain 10  Right lateral flexion 35 28  Left lateral flexion 25 33  Right rotation 38 55  Left rotation 40 57   (Blank rows = not tested)  UPPER EXTREMITY MMT:  MMT Right eval Left eval Right 07/14/23 Left 07/14/23 Rt/Lt 07/28/23 Rt/Lt 09/22/23  Shoulder flexion 4 3- 4 4- 4-/4- 4/4-  Shoulder extension        Shoulder abduction 4 3- 4 4- 4/4- 4/4-  Shoulder adduction        Shoulder extension        Shoulder internal rotation        Shoulder external rotation        Elbow flexion 4+ 3+ 4+ 4- 4+/4- 4+/4  Elbow extension 4+ 3 4+ 4+ 4+/4- 4+/4  Wrist flexion        Wrist extension        Wrist ulnar deviation        Wrist radial deviation        Wrist pronation        GRIP STRENGTH 50 19 60 53  65/45   (Blank rows = not tested)  UPPER EXTREMITY ROM:  Active ROM Right eval Left eval  Shoulder flexion WFL 60  Shoulder extension    Shoulder abduction WFL 85 pain  Shoulder adduction    Shoulder extension    Shoulder internal rotation    Shoulder external rotation    Elbow flexion    Elbow extension    Wrist flexion    Wrist extension    Wrist ulnar deviation    Wrist radial deviation    Wrist pronation    Wrist supination     (Blank rows = not tested)  LOWER EXTREMITY MMT:    MMT Right eval Left eval Rt/Lt  09/22/23  Hip flexion 4- 4- 4/3+  Hip extension     Hip abduction     Hip adduction     Hip internal rotation     Hip external rotation     Knee flexion 4 4 4/4-  Knee extension 4- 4- 4/4-  Ankle dorsiflexion 4- 4- 4-/3+   Ankle plantarflexion     Ankle inversion     Ankle eversion      (Blank rows = not tested)  CERVICAL SPECIAL TESTS:  Spurling's test: Positive  5 x STS with UE support = 18.15 seconds (07/28/23), 22.31 seconds (09/22/23) TUG (no AD): 21.84 seconds (07/28/23) 23.21 seconds (09/22/23)   [facility] Adult PT Treatment:                                                DATE: 10/13/23 Therapeutic Exercise/Activity: Standing UBE L1 x 3 min alt fwd/bkwd Mini squat x 15 Heel raise x 15 Hip abd x 15 Hip ext x 15 Shoulder wall slides Standing with pillow and towel behind head to improve posture - difficult today! Seated Shoulder flexion with dowel Shoulder circles bkwd/fwd    [facility] Adult PT Treatment:  DATE: 10/06/23 Therapeutic Exercise/Activity: Standing UBE x 2 min alt fwd/bkwd L1 Resisted walking red power band fwd/bkwd Sidestep at counter Mini squat 10 x 3 sec hold March 10 x 3 sec hold Heel raise 10 x 3 sec hold Sit <> stand 2 x 5 Seated Overhead press with dowel 10 x 3 sec hold Bent arm shoulder abd 1# 10 x 3 sec hold Backward shoulder rolls LAQ red TB 10 x 3 sec hold  [facility] Adult PT Treatment:                                                DATE: 09/29/23 Therapeutic Exercise/Activity: Standing Heel raise 12 x 3 sec hold HS curl 10 x 3 sec hold Hip abd 10 x 3 sec hold Hip ext 10 x 3 sec hold March 10 x 3 sec hold With back against wall: pec stretch, cervical retraction UBE x 2 min level 1 Seated Overhead press 10 x 3 sec hold Bent arm shoulder abd 10 x 3 sec hold Pec stretch   [facility] Adult PT Treatment:                                                DATE: 09/22/23 Therapeutic Exercise: Re-Evaluation (see above) 5 x STS = 22.31 seconds TUG (no AD) = 23.21 seconds  Therapeutic Activity: Standing shoulder Wall slide x 10 with 3 sec hold Standing Hip abd x 10 with 3 sec hold Standing HS curl x 10 with 3 sec hold Hip flexion seated  x 10 3 sec hold  Self Care: Education on continued PT POC and goals, HEP   PATIENT EDUCATION:  Education details: Updated HEP Person educated: Patient Education method: Explanation, Demonstration, and Handouts Education comprehension: verbalized understanding and returned demonstration  HOME EXERCISE PROGRAM: Access Code: [email or web address] URL: [email or web address] Date: 09/22/2023 Prepared by: Darice Conine  Exercises - Dead Bug  - 1 x daily - 7 x weekly - 3 sets - 10 reps - Shoulder Flexion Wall Slide with Towel  - 1 x daily - 7 x weekly - 1 sets - 10 reps - 5-10 seconds hold - Sit to Stand with Armchair  - 1 x daily - 7 x weekly - 2 sets - 10 reps - Standing Gastroc Stretch  - 2 x daily - 7 x weekly - 1 sets - 5-3 reps - 10-30 sec hold - Heel Toe Raises with Counter Support  - 1 x daily - 7 x weekly - 1 sets - 10 reps - 3-5 seconds hold - Seated March with Resistance  - 1 x daily - 7 x weekly - 1 sets - 10 reps - 3-5 seconds hold - Standing Hip Abduction with Counter Support  - 1 x daily - 7 x weekly - 1 sets - 10 reps - 3-5 seconds hold - Standing Hip Extension with Counter Support  - 1 x daily - 7 x weekly - 1 sets - 10 reps - 3-5 seconds hold - Standing Hamstring Curl with Chair Support  - 1 x daily - 7 x weekly - 1 sets - 10 reps - 3-5 seconds hold - Mini Squat with Counter Support  - 1 x daily - 7 x weekly - 1  sets - 10 reps - 3-5 seconds hold  ASSESSMENT:  CLINICAL IMPRESSION: Continued to progress endurance by increasing reps of standing exercises. Pt very fatigued after standing and requires prolonged seated rest break to recover. He states he is working hard at home to improve posture and continue progressing strength   OBJECTIVE IMPAIRMENTS: decreased activity tolerance, decreased mobility, decreased ROM, decreased strength, increased muscle spasms, impaired sensation, impaired UE functional use, postural dysfunction, and pain.     GOALS: Goals reviewed  with patient? Yes  SHORT TERM GOALS: Target date: 07/01/2023  Pt will be independent in initial HEP Baseline:  Goal status: MET  2.  Pt will improve Lt UE AROM to 90 degrees flexion and 100 degrees abduction Baseline: see above 06/30/23: flexion 108 deg, abd 76 deg Goal status: IN PROGRESS   LONG TERM GOALS: Target date: 11/17/2023    Pt will be independent with advanced HEP Baseline:  Goal status: IN PROGRESS  2.  NDI will improve to <= 16/50 to demo improved functional mobility Baseline: 26/50 07/14/23: 22/50 = 44.0% 08/18/23: 20/50 40% Goal status: IN PROGRESS  3.  Pt will improve Lt UE strength to 4+/5 to improve functional activity tolerance Baseline: see above 07/14/23: see above Goal status: UPDATED  4.  Pt will report 50% reduction in numbness in bilat UEs Baseline:  07/07/23: no change 07/14/23: no change 07/28/23: 10% better 08/18/23: 25% better Goal status: NOT MET  5.  Pt will demo improved LE strength with 5 x STS with UE support <= 15 seconds Baseline:  Goal status: INITIAL  6.  Pt will improve TUG to <= 16 seconds to demo improved gait and balance Baseline:  Goal status: INITIAL   PLAN:  PT FREQUENCY: 2x/week  PT DURATION: 8 weeks  PLANNED INTERVENTIONS: 97164- PT Re-evaluation, 97110-Therapeutic exercises, 97530- Therapeutic activity, 97112- Neuromuscular re-education, 97535- Self Care, [id number]- Manual therapy, [id number]- Aquatic Therapy, [id number]- Electrical stimulation (unattended), Patient/Family education, Taping, Dry Needling, Cryotherapy, and Moist heat  PLAN FOR NEXT SESSION: Standing LE strength and balance, postural strength   Lyra Alaimo, PT 10/13/2023, 1:57 PM

## 2023-10-14 LAB — B12 AND FOLATE PANEL
Folate: >20 ng/mL (ref >3.0)
Vitamin B-12: 707 pg/mL (ref 232–1245)

## 2023-10-14 LAB — HEMOGLOBIN A1C
Est. average glucose Bld gHb Est-mCnc: 128 mg/dL
Hgb A1c MFr Bld: 6.1 % — ABNORMAL HIGH (ref 4.8–5.6)

## 2023-10-15 DIAGNOSIS — E1122 Type 2 diabetes mellitus with diabetic chronic kidney disease: Secondary | ICD-10-CM | POA: Diagnosis not present

## 2023-10-15 DIAGNOSIS — N1832 Chronic kidney disease, stage 3b: Secondary | ICD-10-CM | POA: Diagnosis not present

## 2023-10-15 NOTE — Progress Notes (Addendum)
 Damon Acosta - 83 y.o. male MRN [id number]  Date of birth: [birth year]  Subjective Chief Complaint  Patient presents with   Follow-up    HPI Damon Acosta is a 83 y.o. male here today for follow-up visit.  He reports he is doing okay at this time.  Recently seen by one of my colleagues here in clinic for dyspnea.  Chest x-ray unremarkable.  EKG without significant changes.  Mildly elevated BNP.  He does report that his shortness of breath does seem to be improved at this time.  He continues with physical therapy.  He denies chest pain or tightness.    Neck pain improved but still present.  Continues with physical therapy.  Blood pressure remains well-controlled.  Doing well with current medications.  Denies side effects at this time.   Blood sugars have been pretty well-controlled with current medications.  No side effects with current medications.  Due for updated A1c.  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   CKD (chronic kidney disease)    Complication of anesthesia    patient had dificulty waking up   Coronary artery disease    Diabetes mellitus without complication ([name])    Gout    Hyperlipidemia    Hypertension    Myocardial infarction ([name])    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   colonic pseudo-obstruction, s/p colectomy with ileostomy 12/2013   Overactive bladder    Sleep apnea    Stroke [facility])     Past Surgical History:  Procedure Laterality Date    CHOLECYSTECTOMY     COLOSTOMY     POSTERIOR CERVICAL LAMINECTOMY N/A 02/09/2023   Procedure: Laminectomy  Cervical two - Cervical seven;  Surgeon: Louis Shove, MD;  Location: [facility] OR;  Service: Neurosurgery;  Laterality: N/A;   PROSTATE SURGERY      Social History   Socioeconomic History   Marital status: Married    Spouse name: Not on file   Number of children: Not on file   Years of education: Not on file   Highest education level: Not on file  Occupational History   Not on file  Tobacco Use   Smoking status: Former   Smokeless tobacco: Never  Vaping Use   Vaping status: Never Used  Substance and Sexual Activity   Alcohol use: Not Currently   Drug use: Never   Sexual activity: Not Currently  Other Topics Concern   Not on file  Social History Narrative   Not on file   Social Drivers of Health   Financial Resource Strain: Low Risk  (07/31/2021)   Received from [facility]   Overall Financial Resource Strain (CARDIA)    Difficulty of Paying Living Expenses: Not hard at all  Food Insecurity: No Food Insecurity (05/10/2023)   Received from [facility]   Hunger Vital Sign    Within the past 12 months, you worried  that your food would run out before you got the money to buy more.: Never true    Within the past 12 months, the food you bought just didn't last and you didn't have money to get more.: Never true  Transportation Needs: No Transportation Needs (05/12/2023)   Received from [facility]   PRAPARE - Transportation    Lack of Transportation (Medical): No    Lack of Transportation (Non-Medical): No  Physical Activity: Inactive (05/07/2023)   Exercise Vital Sign    Days of Exercise per Week: 0 days    Minutes of Exercise per Session: 0 min  Stress: No Stress Concern Present (05/10/2023)   Received from [facility] of Occupational Health - Occupational Stress Questionnaire    Feeling of Stress : Not at all  Recent Concern: Stress - Stress  Concern Present (05/07/2023)   [employer] of Occupational Health - Occupational Stress Questionnaire    Feeling of Stress : To some extent  Social Connections: Socially Integrated (05/07/2023)   Social Connection and Isolation Panel    Frequency of Communication with Friends and Family: More than three times a week    Frequency of Social Gatherings with Friends and Family: Once a week    Attends Religious Services: More than 4 times per year    Active Member of Clubs or Organizations: Yes    Attends Banker Meetings: 1 to 4 times per year    Marital Status: Married    Family History  Problem Relation Age of Onset   Diabetes Sister     Health Maintenance  Topic Date Due   Medicare Annual Wellness (AWV)  Never done   DTaP/Tdap/Td (1 - Tdap) Never done   Zoster Vaccines- Shingrix (2 of 2) 05/02/2021   OPHTHALMOLOGY EXAM  02/27/2022   FOOT EXAM  06/12/2023   Influenza Vaccine  09/11/2023   COVID-19 Vaccine (6 - Moderna risk 2024-25 season) 10/12/2023   HEMOGLOBIN A1C  04/11/2024   Diabetic kidney evaluation - eGFR measurement  09/22/2024   Diabetic kidney evaluation - Urine ACR  10/14/2024   Pneumococcal Vaccine: 50+ Years  Completed   HPV VACCINES  Aged Out   Meningococcal B Vaccine  Aged Out     ----------------------------------------------------------------------------------------------------------------------------------------------------------------------------------------------------------------- Physical Exam BP 112/63 (BP Location: Left Arm, Patient Position: Sitting, Cuff Size: Normal)   Pulse 87   Ht 5' 9 (1.753 m)   Wt 172 lb (78 kg)   SpO2 98%   BMI 25.40 kg/m   Physical Exam Constitutional:      Appearance: Normal appearance.  HENT:     Head: Normocephalic and atraumatic.  Eyes:     General: No scleral icterus. Cardiovascular:     Rate and Rhythm: Normal rate and regular rhythm.  Pulmonary:     Effort: Pulmonary effort is normal.      Breath sounds: Normal breath sounds.  Musculoskeletal:     Cervical back: Neck supple.  Neurological:     Mental Status: He is alert.  Psychiatric:        Mood and Affect: Mood normal.        Behavior: Behavior normal.     ------------------------------------------------------------------------------------------------------------------------------------------------------------------------------------------------------------------- Assessment and Plan  Type 2 diabetes mellitus with chronic kidney disease, without long-term current use of insulin  ([name]) Diabetes remains well controlled.  Recommend continuation of Tradjenta  at current strength.  Continue losartan  at current strength for associated CKD  Dyslipidemia associated with type 2 diabetes mellitus ([name]) Continue atorvastatin  at current  strength.   Severe episode of recurrent major depressive disorder, without psychotic features ([name]) Continue lexapro  at current strength.   Hypertension associated with diabetes ([name]) Blood pressure is well-controlled at this time.  Will plan to continue current medications.  PAF (paroxysmal atrial fibrillation) ([name]) Continue metoprolol  for rate control. He will remain anticoagulated with eliquis .   Dyspnea Dyspnea seems improved at this time.  They will let me know if worsening again.  He did have some mild anemia on lab work.  Checking B12 and folate.   Meds ordered this encounter  Medications   methocarbamol  (ROBAXIN ) 500 MG tablet    Sig: Take 1 tablet (500 mg total) by mouth 3 (three) times daily.    Dispense:  90 tablet    Refill:  0   allopurinol  (ZYLOPRIM ) 100 MG tablet    Sig: Take 1 tablet (100 mg total) by mouth daily.    Dispense:  90 tablet    Refill:  3   atorvastatin  (LIPITOR) 20 MG tablet    Sig: Take 1 tablet (20 mg total) by mouth daily.    Dispense:  90 tablet    Refill:  3   DISCONTD: escitalopram  (LEXAPRO ) 10 MG tablet    Sig: Take 1 tablet (10 mg total) by  mouth daily.    Dispense:  30 tablet    Refill:  0   linagliptin  (TRADJENTA ) 5 MG TABS tablet    Sig: Take 1 tablet (5 mg total) by mouth daily.    Dispense:  90 tablet    Refill:  1   losartan  (COZAAR ) 25 MG tablet    Sig: Take 1 tablet (25 mg total) by mouth daily.    Dispense:  90 tablet    Refill:  3   metoprolol  succinate (TOPROL -XL) 25 MG 24 hr tablet    Sig: Take 0.5 tablets (12.5 mg total) by mouth daily.    Dispense:  45 tablet    Refill:  3    Return in about 4 months (around 02/12/2024) for Type 2 Diabetes, Hypertension.

## 2023-10-16 LAB — MICROALBUMIN / CREATININE URINE RATIO
Creatinine, Urine: 131.6 mg/dL
Microalb/Creat Ratio: 4 mg/g{creat} (ref 0–29)
Microalbumin, Urine: 4.8 ug/mL

## 2023-10-18 NOTE — Assessment & Plan Note (Signed)
 Dyspnea seems improved at this time.  They will let me know if worsening again.  He did have some mild anemia on lab work.  Checking B12 and folate.

## 2023-10-18 NOTE — Assessment & Plan Note (Signed)
 Continue lexapro at current strength.

## 2023-10-18 NOTE — Assessment & Plan Note (Signed)
 Blood pressure is well-controlled at this time.  Will plan to continue current medications.

## 2023-10-18 NOTE — Assessment & Plan Note (Signed)
 Continue atorvastatin at current strength.

## 2023-10-18 NOTE — Assessment & Plan Note (Addendum)
 Diabetes remains well controlled.  Recommend continuation of Tradjenta  at current strength.  Continue losartan  at current strength for associated CKD

## 2023-10-18 NOTE — Assessment & Plan Note (Signed)
Continue metoprolol for rate control. He will remain anticoagulated with eliquis.

## 2023-10-20 ENCOUNTER — Encounter: Payer: Self-pay | Admitting: Physical Therapy

## 2023-10-20 ENCOUNTER — Ambulatory Visit: Admitting: Physical Therapy

## 2023-10-20 DIAGNOSIS — M6281 Muscle weakness (generalized): Secondary | ICD-10-CM

## 2023-10-20 DIAGNOSIS — R29898 Other symptoms and signs involving the musculoskeletal system: Secondary | ICD-10-CM

## 2023-10-20 NOTE — Therapy (Signed)
 OUTPATIENT PHYSICAL THERAPY CERVICAL TREATMENT  Patient Name: Damon Acosta MRN: [id number] DOB:1940-09-21, 83 y.o., male Today's Date: 10/20/2023  END OF SESSION:  PT End of Session - 10/20/23 1454     Visit Number 24    Number of Visits 36    Date for PT Re-Evaluation 11/17/23    Authorization Type Medicare    Authorization - Visit Number 24    Authorization - Number of Visits 26    Progress Note Due on Visit 30    PT Start Time 1415    PT Stop Time 1453    PT Time Calculation (min) 38 min    Activity Tolerance Patient tolerated treatment well    Behavior During Therapy [facility] for tasks assessed/performed               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   CKD (chronic kidney disease)    Complication of anesthesia    patient had dificulty waking up   Coronary artery disease    Diabetes mellitus without complication ([name])    Gout    Hyperlipidemia    Hypertension    Myocardial infarction ([name])    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   colonic pseudo-obstruction, s/p colectomy with ileostomy 12/2013   Overactive bladder    Sleep apnea    Stroke [facility])    Past Surgical History:  Procedure Laterality Date   CHOLECYSTECTOMY     COLOSTOMY     POSTERIOR CERVICAL LAMINECTOMY N/A 02/09/2023   Procedure: Laminectomy  Cervical two - Cervical seven;  Surgeon: Louis Shove, MD;  Location: [facility] OR;  Service: Neurosurgery;  Laterality: N/A;   PROSTATE SURGERY     Patient Active Problem List   Diagnosis Date Noted   Cervical spondylosis with myelopathy and radiculopathy 02/09/2023   Atopic dermatitis 10/28/2022   Throat clearing 09/16/2022   Dyspnea 07/07/2022   Insomnia 03/17/2022   Callus of foot, right heel 12/11/2021   Cervical cord  myelomalacia ([name]) 10/30/2021   AV block, 1st degree 08/14/2021   Sinus bradycardia on ECG 08/14/2021   OSA (obstructive sleep apnea) 08/01/2021   History of MI (myocardial infarction) 10/21/2019   Diversion colitis 10/07/2019   Gastroesophageal reflux disease without esophagitis 10/07/2019   PAF (paroxysmal atrial fibrillation) ([name]) 09/16/2019   Atrial flutter ([name]) 09/15/2019   NSTEMI (non-ST elevated myocardial infarction) ([name]) 09/15/2019   Recurrent major depressive disorder, in partial remission ([name]) 09/15/2019   Coronary artery disease of native artery of native heart with stable angina pectoris ([name]) 09/15/2019   Severe episode of recurrent major depressive disorder, without psychotic features ([name]) 09/15/2019   Asymptomatic PVCs 07/27/2018   CKD (chronic kidney disease) stage 3, GFR 30-59 ml/min ([name]) 04/27/2018   Mild cognitive impairment 04/20/2018   Microcytic anemia 04/20/2018   Primary osteoarthritis of both knees 02/17/2018   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   Type 2 diabetes mellitus with chronic kidney disease, without long-term current use of insulin  ([name]) 01/06/2018   Dyslipidemia associated with type 2 diabetes mellitus ([name]) 01/06/2018   OAB (overactive bladder) 01/06/2018   History of prostate cancer 01/06/2018   Lumbar spinal stenosis 12/15/2017   Colostomy status ([name]) 12/15/2017   Hypertension associated with diabetes ([name]) 12/15/2017   Gout  12/15/2017   History of CVA (cerebrovascular accident) 12/15/2017   Ileostomy in place [facility]) 12/15/2017    PCP: Alvia MART PROVIDER: Victory Gunnels  REFERRING DIAG: cervical spinal stenosis with myelopathy  THERAPY DIAG:  Muscle weakness (generalized)  Other symptoms and signs involving the musculoskeletal system  Rationale for Evaluation and Treatment: Rehabilitation  ONSET DATE: 02/09/24  SUBJECTIVE:                                                                                                                                                                                                          SUBJECTIVE STATEMENT: Pt states my neck still really hurts. He states he has been exercising at home but that he is still limited by pain. He bought a Chiropractor from [employer] but it was too intense and he was sore for 2 days after using it   PERTINENT HISTORY:  Lumbar spinal stenosis CVA CKD NSTEMI  Pt is s/p cervical laminectomy C2-C7 on 02/09/24. Since the surgery he continues with pain in his neck and with Lt UE weakness and bilat UE numbness/tingling. Prior to surgery he used a PFRW for ambulation but he has had a few recent falls and is now using a power w/c for mobility in and out of the house. Pt reports difficulty using Lt UE for functional activities. He reports he drops items often due to numbness in hands. Numbness does not get better or worse, it is just always there.  Hand dominance: Right  PAIN:  Are you having pain? Yes: NPRS scale: 8/10 currently, 9/10 at worst Pain location: neck, LT UE Pain description: sore, ache Aggravating factors: increased use of Lt UE Relieving factors: meds  PRECAUTIONS: Fall  RED FLAGS: None     WEIGHT BEARING RESTRICTIONS: No  FALLS:  Has patient fallen in last 6 months? Yes. Number of falls 2-3  LIVING ENVIRONMENT: Lives with: lives with their family Lives in: House/apartment Stairs: No Has following equipment at home: Environmental consultant - 2 wheeled and Wheelchair (power)  OCCUPATION: retired  PLOF: Independent with basic ADLs, Independent with household mobility with device, and Requires assistive device for independence  PATIENT GOALS: decrease pain, get Lt UE to work, decrease numbness  NEXT MD VISIT: 09/05/23 [facility])  OBJECTIVE:  Note: Objective measures were completed at Evaluation unless otherwise noted.  DIAGNOSTIC FINDINGS:  None on file since surgery  PATIENT SURVEYS:  NDI 26/50 - 52%  COGNITION: Overall cognitive  status: History of cognitive impairments - at baseline  SENSATION: Light touch: Impaired   POSTURE: forward head  PALPATION: Increased mm spasticity Rt > Lt cervical paraspinals Increased tenderness to light touch throughout cervical musculature, UT bilat Some decreased symptoms with manual traction   CERVICAL ROM:   Active ROM A/PROM (deg) eval AROM 07/28/23  Flexion 34 30  Extension 14 pain 10  Right lateral flexion 35 28  Left lateral flexion 25 33  Right rotation 38 55  Left rotation 40 57   (Blank rows = not tested)  UPPER EXTREMITY MMT:  MMT Right eval Left eval Right 07/14/23 Left 07/14/23 Rt/Lt 07/28/23 Rt/Lt 09/22/23  Shoulder flexion 4 3- 4 4- 4-/4- 4/4-  Shoulder extension        Shoulder abduction 4 3- 4 4- 4/4- 4/4-  Shoulder adduction        Shoulder extension        Shoulder internal rotation        Shoulder external rotation        Elbow flexion 4+ 3+ 4+ 4- 4+/4- 4+/4  Elbow extension 4+ 3 4+ 4+ 4+/4- 4+/4  Wrist flexion        Wrist extension        Wrist ulnar deviation        Wrist radial deviation        Wrist pronation        GRIP STRENGTH 50 19 60 53  65/45   (Blank rows = not tested)  UPPER EXTREMITY ROM:  Active ROM Right eval Left eval  Shoulder flexion WFL 60  Shoulder extension    Shoulder abduction WFL 85 pain  Shoulder adduction    Shoulder extension    Shoulder internal rotation    Shoulder external rotation    Elbow flexion    Elbow extension    Wrist flexion    Wrist extension    Wrist ulnar deviation    Wrist radial deviation    Wrist pronation    Wrist supination     (Blank rows = not tested)  LOWER EXTREMITY MMT:    MMT Right eval Left eval Rt/Lt  09/22/23  Hip flexion 4- 4- 4/3+  Hip extension     Hip abduction     Hip adduction     Hip internal rotation     Hip external rotation     Knee flexion 4 4 4/4-  Knee extension 4- 4- 4/4-  Ankle dorsiflexion 4- 4- 4-/3+  Ankle plantarflexion     Ankle inversion      Ankle eversion      (Blank rows = not tested)  CERVICAL SPECIAL TESTS:  Spurling's test: Positive  5 x STS with UE support = 18.15 seconds (07/28/23), 22.31 seconds (09/22/23) TUG (no AD): 21.84 seconds (07/28/23) 23.21 seconds (09/22/23)   [facility] Adult PT Treatment:                                                DATE: 10/20/23 Therapeutic Exercise/Activity: Standing UBE L1 x 3 min alt fwd/bkwd Heel raise 15 x 3 sec hold Mini squat 10 x 3 sec hold Slow march x 15 Side step at counter Bkwd/fwd walking at counter Standing with pillow behind head for posture Seated Scap squeeze x 10 Trunk ext with hands behind head Overhead press 1# x 10 Bent arm shoulder abd 1# x 10 Punching 1# x 10   [facility] Adult PT Treatment:  DATE: 10/13/23 Therapeutic Exercise/Activity: Standing UBE L1 x 3 min alt fwd/bkwd Mini squat x 15 Heel raise x 15 Hip abd x 15 Hip ext x 15 Shoulder wall slides Standing with pillow and towel behind head to improve posture - difficult today! Seated Shoulder flexion with dowel Shoulder circles bkwd/fwd    [facility] Adult PT Treatment:                                                DATE: 10/06/23 Therapeutic Exercise/Activity: Standing UBE x 2 min alt fwd/bkwd L1 Resisted walking red power band fwd/bkwd Sidestep at counter Mini squat 10 x 3 sec hold March 10 x 3 sec hold Heel raise 10 x 3 sec hold Sit <> stand 2 x 5 Seated Overhead press with dowel 10 x 3 sec hold Bent arm shoulder abd 1# 10 x 3 sec hold Backward shoulder rolls LAQ red TB 10 x 3 sec hold  [facility] Adult PT Treatment:                                                DATE: 09/29/23 Therapeutic Exercise/Activity: Standing Heel raise 12 x 3 sec hold HS curl 10 x 3 sec hold Hip abd 10 x 3 sec hold Hip ext 10 x 3 sec hold March 10 x 3 sec hold With back against wall: pec stretch, cervical retraction UBE x 2 min level 1 Seated Overhead press 10 x 3 sec  hold Bent arm shoulder abd 10 x 3 sec hold Pec stretch     PATIENT EDUCATION:  Education details: Updated HEP Person educated: Patient Education method: Explanation, Demonstration, and Handouts Education comprehension: verbalized understanding and returned demonstration  HOME EXERCISE PROGRAM: Access Code: [email or web address] URL: [email or web address] Date: 09/22/2023 Prepared by: Darice Conine  Exercises - Dead Bug  - 1 x daily - 7 x weekly - 3 sets - 10 reps - Shoulder Flexion Wall Slide with Towel  - 1 x daily - 7 x weekly - 1 sets - 10 reps - 5-10 seconds hold - Sit to Stand with Armchair  - 1 x daily - 7 x weekly - 2 sets - 10 reps - Standing Gastroc Stretch  - 2 x daily - 7 x weekly - 1 sets - 5-3 reps - 10-30 sec hold - Heel Toe Raises with Counter Support  - 1 x daily - 7 x weekly - 1 sets - 10 reps - 3-5 seconds hold - Seated March with Resistance  - 1 x daily - 7 x weekly - 1 sets - 10 reps - 3-5 seconds hold - Standing Hip Abduction with Counter Support  - 1 x daily - 7 x weekly - 1 sets - 10 reps - 3-5 seconds hold - Standing Hip Extension with Counter Support  - 1 x daily - 7 x weekly - 1 sets - 10 reps - 3-5 seconds hold - Standing Hamstring Curl with Chair Support  - 1 x daily - 7 x weekly - 1 sets - 10 reps - 3-5 seconds hold - Mini Squat with Counter Support  - 1 x daily - 7 x weekly - 1 sets - 10 reps - 3-5 seconds hold  ASSESSMENT:  CLINICAL IMPRESSION: Pt  is progressing with standing tolerance and LE endurance. He continues with decreased postural strength and endurance especially in standing. He is progressing towards goals   OBJECTIVE IMPAIRMENTS: decreased activity tolerance, decreased mobility, decreased ROM, decreased strength, increased muscle spasms, impaired sensation, impaired UE functional use, postural dysfunction, and pain.     GOALS: Goals reviewed with patient? Yes  SHORT TERM GOALS: Target date: 07/01/2023  Pt will be independent  in initial HEP Baseline:  Goal status: MET  2.  Pt will improve Lt UE AROM to 90 degrees flexion and 100 degrees abduction Baseline: see above 06/30/23: flexion 108 deg, abd 76 deg Goal status: IN PROGRESS   LONG TERM GOALS: Target date: 11/17/2023    Pt will be independent with advanced HEP Baseline:  Goal status: IN PROGRESS  2.  NDI will improve to <= 16/50 to demo improved functional mobility Baseline: 26/50 07/14/23: 22/50 = 44.0% 08/18/23: 20/50 40% Goal status: IN PROGRESS  3.  Pt will improve Lt UE strength to 4+/5 to improve functional activity tolerance Baseline: see above 07/14/23: see above Goal status: UPDATED  4.  Pt will report 50% reduction in numbness in bilat UEs Baseline:  07/07/23: no change 07/14/23: no change 07/28/23: 10% better 08/18/23: 25% better Goal status: NOT MET  5.  Pt will demo improved LE strength with 5 x STS with UE support <= 15 seconds Baseline:  Goal status: INITIAL  6.  Pt will improve TUG to <= 16 seconds to demo improved gait and balance Baseline:  Goal status: INITIAL   PLAN:  PT FREQUENCY: 2x/week  PT DURATION: 8 weeks  PLANNED INTERVENTIONS: [id number]- PT Re-evaluation, 97110-Therapeutic exercises, 97530- Therapeutic activity, [id number]- Neuromuscular re-education, 97535- Self Care, [id number]- Manual therapy, [id number]- Aquatic Therapy, [id number]- Electrical stimulation (unattended), Patient/Family education, Taping, Dry Needling, Cryotherapy, and Moist heat  PLAN FOR NEXT SESSION: Standing LE strength and balance, postural strength   Renette Hsu, PT 10/20/2023, 2:56 PM

## 2023-10-25 ENCOUNTER — Ambulatory Visit: Payer: Self-pay | Admitting: Family Medicine

## 2023-10-27 ENCOUNTER — Other Ambulatory Visit: Payer: Self-pay | Admitting: Family Medicine

## 2023-10-27 ENCOUNTER — Ambulatory Visit: Admitting: Physical Therapy

## 2023-10-27 ENCOUNTER — Encounter: Payer: Self-pay | Admitting: Physical Therapy

## 2023-10-27 DIAGNOSIS — R29898 Other symptoms and signs involving the musculoskeletal system: Secondary | ICD-10-CM | POA: Diagnosis not present

## 2023-10-27 DIAGNOSIS — F332 Major depressive disorder, recurrent severe without psychotic features: Secondary | ICD-10-CM

## 2023-10-27 DIAGNOSIS — M6281 Muscle weakness (generalized): Secondary | ICD-10-CM

## 2023-10-27 NOTE — Therapy (Signed)
 OUTPATIENT PHYSICAL THERAPY CERVICAL TREATMENT  Patient Name: Damon Acosta MRN: [id number] DOB:1940/09/18, 83 y.o., male Today's Date: 10/27/2023  END OF SESSION:  PT End of Session - 10/27/23 1358     Visit Number 25    Number of Visits 36    Date for PT Re-Evaluation 11/17/23    Authorization Type Medicare    Authorization - Visit Number 25    Progress Note Due on Visit 30    PT Start Time 1315    PT Stop Time 1356    PT Time Calculation (min) 41 min    Activity Tolerance Patient tolerated treatment well    Behavior During Therapy [facility] for tasks assessed/performed                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   CKD (chronic kidney disease)    Complication of anesthesia    patient had dificulty waking up   Coronary artery disease    Diabetes mellitus without complication ([name])    Gout    Hyperlipidemia    Hypertension    Myocardial infarction ([name])    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   colonic pseudo-obstruction, s/p colectomy with ileostomy 12/2013   Overactive bladder    Sleep apnea    Stroke [facility])    Past Surgical History:  Procedure Laterality Date   CHOLECYSTECTOMY     COLOSTOMY     POSTERIOR CERVICAL LAMINECTOMY N/A 02/09/2023   Procedure: Laminectomy  Cervical two - Cervical seven;  Surgeon: Louis Shove, MD;  Location: [facility] OR;  Service: Neurosurgery;  Laterality: N/A;   PROSTATE SURGERY     Patient Active Problem List   Diagnosis Date Noted   Cervical spondylosis with myelopathy and radiculopathy 02/09/2023   Atopic dermatitis 10/28/2022   Throat clearing 09/16/2022   Dyspnea 07/07/2022   Insomnia 03/17/2022   Callus of foot, right heel 12/11/2021   Cervical cord myelomalacia ([name]) 10/30/2021   AV  block, 1st degree 08/14/2021   Sinus bradycardia on ECG 08/14/2021   OSA (obstructive sleep apnea) 08/01/2021   History of MI (myocardial infarction) 10/21/2019   Diversion colitis 10/07/2019   Gastroesophageal reflux disease without esophagitis 10/07/2019   PAF (paroxysmal atrial fibrillation) ([name]) 09/16/2019   Atrial flutter ([name]) 09/15/2019   NSTEMI (non-ST elevated myocardial infarction) ([name]) 09/15/2019   Recurrent major depressive disorder, in partial remission ([name]) 09/15/2019   Coronary artery disease of native artery of native heart with stable angina pectoris ([name]) 09/15/2019   Severe episode of recurrent major depressive disorder, without psychotic features ([name]) 09/15/2019   Asymptomatic PVCs 07/27/2018   CKD (chronic kidney disease) stage 3, GFR 30-59 ml/min ([name]) 04/27/2018   Mild cognitive impairment 04/20/2018   Microcytic anemia 04/20/2018   Primary osteoarthritis of both knees 02/17/2018   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   Type 2 diabetes mellitus with chronic kidney disease, without long-term current use of insulin  ([name]) 01/06/2018   Dyslipidemia associated with type 2 diabetes mellitus ([name]) 01/06/2018   OAB (overactive bladder) 01/06/2018   History of prostate cancer 01/06/2018   Lumbar spinal stenosis 12/15/2017   Colostomy status ([name]) 12/15/2017   Hypertension associated with diabetes ([name]) 12/15/2017   Gout 12/15/2017   History of CVA (cerebrovascular accident)  12/15/2017   Ileostomy in place [facility]) 12/15/2017    PCP: Alvia MART PROVIDER: Victory Gunnels  REFERRING DIAG: cervical spinal stenosis with myelopathy  THERAPY DIAG:  Muscle weakness (generalized)  Other symptoms and signs involving the musculoskeletal system  Rationale for Evaluation and Treatment: Rehabilitation  ONSET DATE: 02/09/24  SUBJECTIVE:                                                                                                                                                                                                          SUBJECTIVE STATEMENT: Pt states his pain is not as bad. He states he still has numbness in his Lt UE but that the neck pain is a lot better   PERTINENT HISTORY:  Lumbar spinal stenosis CVA CKD NSTEMI  Pt is s/p cervical laminectomy C2-C7 on 02/09/24. Since the surgery he continues with pain in his neck and with Lt UE weakness and bilat UE numbness/tingling. Prior to surgery he used a PFRW for ambulation but he has had a few recent falls and is now using a power w/c for mobility in and out of the house. Pt reports difficulty using Lt UE for functional activities. He reports he drops items often due to numbness in hands. Numbness does not get better or worse, it is just always there.  Hand dominance: Right  PAIN:  Are you having pain? Yes: NPRS scale: 6/10 currently, 9/10 at worst Pain location: neck, LT UE Pain description: sore, ache Aggravating factors: increased use of Lt UE Relieving factors: meds  PRECAUTIONS: Fall  RED FLAGS: None     WEIGHT BEARING RESTRICTIONS: No  FALLS:  Has patient fallen in last 6 months? Yes. Number of falls 2-3  LIVING ENVIRONMENT: Lives with: lives with their family Lives in: House/apartment Stairs: No Has following equipment at home: Environmental consultant - 2 wheeled and Wheelchair (power)  OCCUPATION: retired  PLOF: Independent with basic ADLs, Independent with household mobility with device, and Requires assistive device for independence  PATIENT GOALS: decrease pain, get Lt UE to work, decrease numbness  NEXT MD VISIT: 09/05/23 [place])  OBJECTIVE:  Note: Objective measures were completed at Evaluation unless otherwise noted.  DIAGNOSTIC FINDINGS:  None on file since surgery  PATIENT SURVEYS:  NDI 26/50 - 52%  COGNITION: Overall cognitive status: History of cognitive impairments - at baseline  SENSATION: Light touch: Impaired   POSTURE: forward head  PALPATION: Increased mm  spasticity Rt > Lt cervical paraspinals Increased tenderness to light touch throughout cervical musculature, UT bilat Some decreased symptoms with manual traction  CERVICAL ROM:   Active ROM A/PROM (deg) eval AROM 07/28/23  Flexion 34 30  Extension 14 pain 10  Right lateral flexion 35 28  Left lateral flexion 25 33  Right rotation 38 55  Left rotation 40 57   (Blank rows = not tested)  UPPER EXTREMITY MMT:  MMT Right eval Left eval Right 07/14/23 Left 07/14/23 Rt/Lt 07/28/23 Rt/Lt 09/22/23  Shoulder flexion 4 3- 4 4- 4-/4- 4/4-  Shoulder extension        Shoulder abduction 4 3- 4 4- 4/4- 4/4-  Shoulder adduction        Shoulder extension        Shoulder internal rotation        Shoulder external rotation        Elbow flexion 4+ 3+ 4+ 4- 4+/4- 4+/4  Elbow extension 4+ 3 4+ 4+ 4+/4- 4+/4  Wrist flexion        Wrist extension        Wrist ulnar deviation        Wrist radial deviation        Wrist pronation        GRIP STRENGTH 50 19 60 53  65/45   (Blank rows = not tested)  UPPER EXTREMITY ROM:  Active ROM Right eval Left eval Left 10/27/23  Shoulder flexion WFL 60 90  Shoulder extension     Shoulder abduction WFL 85 pain   Shoulder adduction     Shoulder extension     Shoulder internal rotation     Shoulder external rotation     Elbow flexion     Elbow extension     Wrist flexion     Wrist extension     Wrist ulnar deviation     Wrist radial deviation     Wrist pronation     Wrist supination      (Blank rows = not tested)  LOWER EXTREMITY MMT:    MMT Right eval Left eval Rt/Lt  09/22/23  Hip flexion 4- 4- 4/3+  Hip extension     Hip abduction     Hip adduction     Hip internal rotation     Hip external rotation     Knee flexion 4 4 4/4-  Knee extension 4- 4- 4/4-  Ankle dorsiflexion 4- 4- 4-/3+  Ankle plantarflexion     Ankle inversion     Ankle eversion      (Blank rows = not tested)  CERVICAL SPECIAL TESTS:  Spurling's test: Positive  5 x  STS with UE support = 18.15 seconds (07/28/23), 22.31 seconds (09/22/23) TUG (no AD): 21.84 seconds (07/28/23) 23.21 seconds (09/22/23)   [facility] Adult PT Treatment:                                                DATE: 10/27/23 Therapeutic Exercise/Activity: Nustep L6 x 5 min Standing Side step at counter Step fwd/bkwd over object Step up with 1 UE support 6'' step 2 x 5 bilat Heel/toe raise x 15 Mini squat x 15 Shoulder wall slides 3 x 5 Seated Punches 1# x 10 Overhead press 1# x 10 on Rt, x 4 on Lt Bicep curl 1# x 20 Backward shoulder rolls Overhead press with dowel + trunk ext Trunk ext with hands behind head Bent arm abduction x 10   [facility] Adult PT  Treatment:                                                DATE: 10/20/23 Therapeutic Exercise/Activity: Standing UBE L1 x 3 min alt fwd/bkwd Heel raise 15 x 3 sec hold Mini squat 10 x 3 sec hold Slow march x 15 Side step at counter Bkwd/fwd walking at counter Standing with pillow behind head for posture Seated Scap squeeze x 10 Trunk ext with hands behind head Overhead press 1# x 10 Bent arm shoulder abd 1# x 10 Punching 1# x 10   [facility] Adult PT Treatment:                                                DATE: 10/13/23 Therapeutic Exercise/Activity: Standing UBE L1 x 3 min alt fwd/bkwd Mini squat x 15 Heel raise x 15 Hip abd x 15 Hip ext x 15 Shoulder wall slides Standing with pillow and towel behind head to improve posture - difficult today! Seated Shoulder flexion with dowel Shoulder circles bkwd/fwd    [facility] Adult PT Treatment:                                                DATE: 10/06/23 Therapeutic Exercise/Activity: Standing UBE x 2 min alt fwd/bkwd L1 Resisted walking red power band fwd/bkwd Sidestep at counter Mini squat 10 x 3 sec hold March 10 x 3 sec hold Heel raise 10 x 3 sec hold Sit <> stand 2 x 5 Seated Overhead press with dowel 10 x 3 sec hold Bent arm shoulder abd 1# 10 x 3 sec hold Backward shoulder  rolls LAQ red TB 10 x 3 sec hold   PATIENT EDUCATION:  Education details: Updated HEP Person educated: Patient Education method: Explanation, Demonstration, and Handouts Education comprehension: verbalized understanding and returned demonstration  HOME EXERCISE PROGRAM: Access Code: [email or web address] URL: [email or web address] Date: 09/22/2023 Prepared by: Darice Conine  Exercises - Dead Bug  - 1 x daily - 7 x weekly - 3 sets - 10 reps - Shoulder Flexion Wall Slide with Towel  - 1 x daily - 7 x weekly - 1 sets - 10 reps - 5-10 seconds hold - Sit to Stand with Armchair  - 1 x daily - 7 x weekly - 2 sets - 10 reps - Standing Gastroc Stretch  - 2 x daily - 7 x weekly - 1 sets - 5-3 reps - 10-30 sec hold - Heel Toe Raises with Counter Support  - 1 x daily - 7 x weekly - 1 sets - 10 reps - 3-5 seconds hold - Seated March with Resistance  - 1 x daily - 7 x weekly - 1 sets - 10 reps - 3-5 seconds hold - Standing Hip Abduction with Counter Support  - 1 x daily - 7 x weekly - 1 sets - 10 reps - 3-5 seconds hold - Standing Hip Extension with Counter Support  - 1 x daily - 7 x weekly - 1 sets - 10 reps - 3-5 seconds hold - Standing Hamstring Curl with Chair Support  - 1  x daily - 7 x weekly - 1 sets - 10 reps - 3-5 seconds hold - Mini Squat with Counter Support  - 1 x daily - 7 x weekly - 1 sets - 10 reps - 3-5 seconds hold  ASSESSMENT:  CLINICAL IMPRESSION: Pt able to increase overall standing time in clinic. He is able to progress to step ups today, showing overall improvement in LE strength. He continues to fatigue quickly with Lt UE activities, especially with reaching overhead   OBJECTIVE IMPAIRMENTS: decreased activity tolerance, decreased mobility, decreased ROM, decreased strength, increased muscle spasms, impaired sensation, impaired UE functional use, postural dysfunction, and pain.     GOALS: Goals reviewed with patient? Yes  SHORT TERM GOALS: Target date:  07/01/2023  Pt will be independent in initial HEP Baseline:  Goal status: MET  2.  Pt will improve Lt UE AROM to 90 degrees flexion and 100 degrees abduction Baseline: see above 06/30/23: flexion 108 deg, abd 76 deg Goal status: IN PROGRESS   LONG TERM GOALS: Target date: 11/17/2023    Pt will be independent with advanced HEP Baseline:  Goal status: IN PROGRESS  2.  NDI will improve to <= 16/50 to demo improved functional mobility Baseline: 26/50 07/14/23: 22/50 = 44.0% 08/18/23: 20/50 40% Goal status: IN PROGRESS  3.  Pt will improve Lt UE strength to 4+/5 to improve functional activity tolerance Baseline: see above 07/14/23: see above Goal status: UPDATED  4.  Pt will report 50% reduction in numbness in bilat UEs Baseline:  07/07/23: no change 07/14/23: no change 07/28/23: 10% better 08/18/23: 25% better Goal status: NOT MET  5.  Pt will demo improved LE strength with 5 x STS with UE support <= 15 seconds Baseline:  Goal status: INITIAL  6.  Pt will improve TUG to <= 16 seconds to demo improved gait and balance Baseline:  Goal status: INITIAL   PLAN:  PT FREQUENCY: 2x/week  PT DURATION: 8 weeks  PLANNED INTERVENTIONS: [id number]- PT Re-evaluation, 97110-Therapeutic exercises, 97530- Therapeutic activity, 97112- Neuromuscular re-education, 97535- Self Care, [id number]- Manual therapy, [id number]- Aquatic Therapy, [id number]- Electrical stimulation (unattended), Patient/Family education, Taping, Dry Needling, Cryotherapy, and Moist heat  PLAN FOR NEXT SESSION: Standing LE strength and balance, postural strength   Jerryl Holzhauer, PT 10/27/2023, 1:59 PM

## 2023-11-03 ENCOUNTER — Encounter: Payer: Self-pay | Admitting: Physical Therapy

## 2023-11-03 ENCOUNTER — Ambulatory Visit: Admitting: Physical Therapy

## 2023-11-03 DIAGNOSIS — R29898 Other symptoms and signs involving the musculoskeletal system: Secondary | ICD-10-CM | POA: Diagnosis not present

## 2023-11-03 DIAGNOSIS — M6281 Muscle weakness (generalized): Secondary | ICD-10-CM

## 2023-11-03 NOTE — Therapy (Signed)
 OUTPATIENT PHYSICAL THERAPY CERVICAL TREATMENT  Patient Name: Damon Acosta MRN: [id number] DOB:30-Dec-1940, 83 y.o., male Today's Date: 11/03/2023  END OF SESSION:  PT End of Session - 11/03/23 1232     Visit Number 26    Number of Visits 36    Date for Recertification  11/17/23    Authorization Type Medicare    Authorization - Visit Number 26    Authorization - Number of Visits 36    Progress Note Due on Visit 30    PT Start Time 1145    PT Stop Time 1228    PT Time Calculation (min) 43 min    Activity Tolerance Patient tolerated treatment well    Behavior During Therapy [facility] for tasks assessed/performed                 Past Medical History:  Diagnosis Date   Anxiety    Arthritis    Atrial flutter ([name])    Cancer ([name])    Carotid artery disease 05/25/2022   50-69% RICA stenosis, LICA occlusion justpast the carotid bulb by 05/25/22 CTA   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   CKD (chronic kidney disease)    Complication of anesthesia    patient had dificulty waking up   Coronary artery disease    Diabetes mellitus without complication ([name])    Gout    Hyperlipidemia    Hypertension    Myocardial infarction ([name])    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   colonic pseudo-obstruction, s/p colectomy with ileostomy 12/2013   Overactive bladder    Sleep apnea    Stroke [facility])    Past Surgical History:  Procedure Laterality Date   CHOLECYSTECTOMY     COLOSTOMY     POSTERIOR CERVICAL LAMINECTOMY N/A 02/09/2023   Procedure: Laminectomy  Cervical two - Cervical seven;  Surgeon: Louis Shove, MD;  Location[facility];  Service: Neurosurgery;  Laterality: N/A;   PROSTATE SURGERY     Patient Active Problem List   Diagnosis Date Noted   Cervical spondylosis with myelopathy and radiculopathy 02/09/2023   Atopic dermatitis 10/28/2022   Throat clearing 09/16/2022   Dyspnea 07/07/2022   Insomnia 03/17/2022   Callus of foot, right heel 12/11/2021   Cervical cord  myelomalacia ([name]) 10/30/2021   AV block, 1st degree 08/14/2021   Sinus bradycardia on ECG 08/14/2021   OSA (obstructive sleep apnea) 08/01/2021   History of MI (myocardial infarction) 10/21/2019   Diversion colitis 10/07/2019   Gastroesophageal reflux disease without esophagitis 10/07/2019   PAF (paroxysmal atrial fibrillation) ([name]) 09/16/2019   Atrial flutter ([name]) 09/15/2019   NSTEMI (non-ST elevated myocardial infarction) ([name]) 09/15/2019   Recurrent major depressive disorder, in partial remission 09/15/2019   Coronary artery disease of native artery of native heart with stable angina pectoris 09/15/2019   Severe episode of recurrent major depressive disorder, without psychotic features ([name]) 09/15/2019   Asymptomatic PVCs 07/27/2018   CKD (chronic kidney disease) stage 3, GFR 30-59 ml/min ([name]) 04/27/2018   Mild cognitive impairment 04/20/2018   Microcytic anemia 04/20/2018   Primary osteoarthritis of both knees 02/17/2018   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   Type 2 diabetes mellitus with chronic kidney disease, without long-term current use of insulin  ([name]) 01/06/2018   Dyslipidemia associated with type 2 diabetes mellitus ([name]) 01/06/2018   OAB (overactive bladder) 01/06/2018   History of prostate cancer 01/06/2018   Lumbar spinal stenosis 12/15/2017   Colostomy status ([name]) 12/15/2017   Hypertension associated with diabetes ([name]) 12/15/2017   Gout 12/15/2017  History of CVA (cerebrovascular accident) 12/15/2017   Ileostomy in place [facility]) 12/15/2017    PCP: Alvia MART PROVIDER: Victory Gunnels  REFERRING DIAG: cervical spinal stenosis with myelopathy  THERAPY DIAG:  Muscle weakness (generalized)  Other symptoms and signs involving the musculoskeletal system  Rationale for Evaluation and Treatment: Rehabilitation  ONSET DATE: 02/09/24  SUBJECTIVE:                                                                                                                                                                                                          SUBJECTIVE STATEMENT: Pt states I feel a little better   PERTINENT HISTORY:  Lumbar spinal stenosis CVA CKD NSTEMI  Pt is s/p cervical laminectomy C2-C7 on 02/09/24. Since the surgery he continues with pain in his neck and with Lt UE weakness and bilat UE numbness/tingling. Prior to surgery he used a PFRW for ambulation but he has had a few recent falls and is now using a power w/c for mobility in and out of the house. Pt reports difficulty using Lt UE for functional activities. He reports he drops items often due to numbness in hands. Numbness does not get better or worse, it is just always there.  Hand dominance: Right  PAIN:  Are you having pain? Yes: NPRS scale: 5/10 currently, 9/10 at worst Pain location: neck, LT UE Pain description: sore, ache Aggravating factors: increased use of Lt UE Relieving factors: meds  PRECAUTIONS: Fall  RED FLAGS: None     WEIGHT BEARING RESTRICTIONS: No  FALLS:  Has patient fallen in last 6 months? Yes. Number of falls 2-3  LIVING ENVIRONMENT: Lives with: lives with their family Lives in: House/apartment Stairs: No Has following equipment at home: Environmental consultant - 2 wheeled and Wheelchair (power)  OCCUPATION: retired  PLOF: Independent with basic ADLs, Independent with household mobility with device, and Requires assistive device for independence  PATIENT GOALS: decrease pain, get Lt UE to work, decrease numbness  NEXT MD VISIT: 09/05/23 [place])  OBJECTIVE:  Note: Objective measures were completed at Evaluation unless otherwise noted.  DIAGNOSTIC FINDINGS:  None on file since surgery  PATIENT SURVEYS:  NDI 26/50 - 52%  COGNITION: Overall cognitive status: History of cognitive impairments - at baseline  SENSATION: Light touch: Impaired   POSTURE: forward head  PALPATION: Increased mm spasticity Rt > Lt cervical paraspinals Increased  tenderness to light touch throughout cervical musculature, UT bilat Some decreased symptoms with manual traction   CERVICAL ROM:   Active ROM A/PROM (deg) eval AROM 07/28/23  Flexion  34 30  Extension 14 pain 10  Right lateral flexion 35 28  Left lateral flexion 25 33  Right rotation 38 55  Left rotation 40 57   (Blank rows = not tested)  UPPER EXTREMITY MMT:  MMT Right eval Left eval Right 07/14/23 Left 07/14/23 Rt/Lt 07/28/23 Rt/Lt 09/22/23  Shoulder flexion 4 3- 4 4- 4-/4- 4/4-  Shoulder extension        Shoulder abduction 4 3- 4 4- 4/4- 4/4-  Shoulder adduction        Shoulder extension        Shoulder internal rotation        Shoulder external rotation        Elbow flexion 4+ 3+ 4+ 4- 4+/4- 4+/4  Elbow extension 4+ 3 4+ 4+ 4+/4- 4+/4  Wrist flexion        Wrist extension        Wrist ulnar deviation        Wrist radial deviation        Wrist pronation        GRIP STRENGTH 50 19 60 53  65/45   (Blank rows = not tested)  UPPER EXTREMITY ROM:  Active ROM Right eval Left eval Left 10/27/23  Shoulder flexion WFL 60 90  Shoulder extension     Shoulder abduction WFL 85 pain   Shoulder adduction     Shoulder extension     Shoulder internal rotation     Shoulder external rotation     Elbow flexion     Elbow extension     Wrist flexion     Wrist extension     Wrist ulnar deviation     Wrist radial deviation     Wrist pronation     Wrist supination      (Blank rows = not tested)  LOWER EXTREMITY MMT:    MMT Right eval Left eval Rt/Lt  09/22/23  Hip flexion 4- 4- 4/3+  Hip extension     Hip abduction     Hip adduction     Hip internal rotation     Hip external rotation     Knee flexion 4 4 4/4-  Knee extension 4- 4- 4/4-  Ankle dorsiflexion 4- 4- 4-/3+  Ankle plantarflexion     Ankle inversion     Ankle eversion      (Blank rows = not tested)  CERVICAL SPECIAL TESTS:  Spurling's test: Positive  5 x STS with UE support = 18.15 seconds (07/28/23), 22.31  seconds (09/22/23) TUG (no AD): 21.84 seconds (07/28/23) 23.21 seconds (09/22/23)   [facility] Adult PT Treatment:                                                DATE: 11/03/23 Therapeutic Exercise/Activity: Nustep L6 x 5 min Standing Shoulder extension green TB x 20  Rows green TB x 20 Single arm rows green TB x 10 bilat Step ups 6'' step 1 UE support x 10 bilat Mini squat 15 x 3 sec hold Heel raise 15 x 3 sec hold Slow march x 15 Standing with back against wall: shld flexion with dowel, backward shoulder rolls, thoracic extension Seated Backward shoulder rolls Thoracic extension Overhead press with dowel Trunk ext with hands behind head Punches x 1 min 1# Bent arm abduction 2 x 10 1#   [facility] Adult  PT Treatment:                                                DATE: 10/27/23 Therapeutic Exercise/Activity: Nustep L6 x 5 min Standing Side step at counter Step fwd/bkwd over object Step up with 1 UE support 6'' step 2 x 5 bilat Heel/toe raise x 15 Mini squat x 15 Shoulder wall slides 3 x 5 Seated Punches 1# x 10 Overhead press 1# x 10 on Rt, x 4 on Lt Bicep curl 1# x 20 Backward shoulder rolls Overhead press with dowel + trunk ext Trunk ext with hands behind head Bent arm abduction x 10   [facility] Adult PT Treatment:                                                DATE: 10/20/23 Therapeutic Exercise/Activity: Standing UBE L1 x 3 min alt fwd/bkwd Heel raise 15 x 3 sec hold Mini squat 10 x 3 sec hold Slow march x 15 Side step at counter Bkwd/fwd walking at counter Standing with pillow behind head for posture Seated Scap squeeze x 10 Trunk ext with hands behind head Overhead press 1# x 10 Bent arm shoulder abd 1# x 10 Punching 1# x 10   [facility] Adult PT Treatment:                                                DATE: 10/13/23 Therapeutic Exercise/Activity: Standing UBE L1 x 3 min alt fwd/bkwd Mini squat x 15 Heel raise x 15 Hip abd x 15 Hip ext x 15 Shoulder wall  slides Standing with pillow and towel behind head to improve posture - difficult today! Seated Shoulder flexion with dowel Shoulder circles bkwd/fwd   PATIENT EDUCATION:  Education details: Updated HEP Person educated: Patient Education method: Explanation, Demonstration, and Handouts Education comprehension: verbalized understanding and returned demonstration  HOME EXERCISE PROGRAM: Access Code: [email or web address] URL: [email or web address] Date: 09/22/2023 Prepared by: Darice Conine  Exercises - Dead Bug  - 1 x daily - 7 x weekly - 3 sets - 10 reps - Shoulder Flexion Wall Slide with Towel  - 1 x daily - 7 x weekly - 1 sets - 10 reps - 5-10 seconds hold - Sit to Stand with Armchair  - 1 x daily - 7 x weekly - 2 sets - 10 reps - Standing Gastroc Stretch  - 2 x daily - 7 x weekly - 1 sets - 5-3 reps - 10-30 sec hold - Heel Toe Raises with Counter Support  - 1 x daily - 7 x weekly - 1 sets - 10 reps - 3-5 seconds hold - Seated March with Resistance  - 1 x daily - 7 x weekly - 1 sets - 10 reps - 3-5 seconds hold - Standing Hip Abduction with Counter Support  - 1 x daily - 7 x weekly - 1 sets - 10 reps - 3-5 seconds hold - Standing Hip Extension with Counter Support  - 1 x daily - 7 x weekly - 1 sets - 10 reps - 3-5 seconds hold - Standing  Hamstring Curl with Chair Support  - 1 x daily - 7 x weekly - 1 sets - 10 reps - 3-5 seconds hold - Mini Squat with Counter Support  - 1 x daily - 7 x weekly - 1 sets - 10 reps - 3-5 seconds hold  ASSESSMENT:  CLINICAL IMPRESSION: Pt continues to progress standing tolerance during session. Able to add standing postural strength today with light guarding for balance. Pt continues to fatigue quickly with trunk extension and upright posture requiring frequent cues for posture throughout session. He is progressing well and plans to d/c to HEP at end of this POC   OBJECTIVE IMPAIRMENTS: decreased activity tolerance, decreased mobility, decreased  ROM, decreased strength, increased muscle spasms, impaired sensation, impaired UE functional use, postural dysfunction, and pain.     GOALS: Goals reviewed with patient? Yes  SHORT TERM GOALS: Target date: 07/01/2023  Pt will be independent in initial HEP Baseline:  Goal status: MET  2.  Pt will improve Lt UE AROM to 90 degrees flexion and 100 degrees abduction Baseline: see above 06/30/23: flexion 108 deg, abd 76 deg Goal status: IN PROGRESS   LONG TERM GOALS: Target date: 11/17/2023    Pt will be independent with advanced HEP Baseline:  Goal status: IN PROGRESS  2.  NDI will improve to <= 16/50 to demo improved functional mobility Baseline: 26/50 07/14/23: 22/50 = 44.0% 08/18/23: 20/50 40% Goal status: IN PROGRESS  3.  Pt will improve Lt UE strength to 4+/5 to improve functional activity tolerance Baseline: see above 07/14/23: see above Goal status: UPDATED  4.  Pt will report 50% reduction in numbness in bilat UEs Baseline:  07/07/23: no change 07/14/23: no change 07/28/23: 10% better 08/18/23: 25% better Goal status: NOT MET  5.  Pt will demo improved LE strength with 5 x STS with UE support <= 15 seconds Baseline:  Goal status: INITIAL  6.  Pt will improve TUG to <= 16 seconds to demo improved gait and balance Baseline:  Goal status: INITIAL   PLAN:  PT FREQUENCY: 2x/week  PT DURATION: 8 weeks  PLANNED INTERVENTIONS: [id number]- PT Re-evaluation, 97110-Therapeutic exercises, 97530- Therapeutic activity, 97112- Neuromuscular re-education, 97535- Self Care, [id number]- Manual therapy, [id number]- Aquatic Therapy, [id number]- Electrical stimulation (unattended), Patient/Family education, Taping, Dry Needling, Cryotherapy, and Moist heat  PLAN FOR NEXT SESSION: Standing LE strength and balance, postural strength   Daegen Berrocal, PT 11/03/2023, 12:33 PM

## 2023-11-10 ENCOUNTER — Ambulatory Visit: Admitting: Physical Therapy

## 2023-11-10 ENCOUNTER — Encounter: Payer: Self-pay | Admitting: Physical Therapy

## 2023-11-10 DIAGNOSIS — R29898 Other symptoms and signs involving the musculoskeletal system: Secondary | ICD-10-CM

## 2023-11-10 DIAGNOSIS — M6281 Muscle weakness (generalized): Secondary | ICD-10-CM

## 2023-11-10 NOTE — Therapy (Signed)
 OUTPATIENT PHYSICAL THERAPY CERVICAL TREATMENT  Patient Name: Damon Acosta MRN: [id number] DOB:02/05/1941, 83 y.o., male Today's Date: 11/10/2023  END OF SESSION:  PT End of Session - 11/10/23 1229     Visit Number 27    Number of Visits 36    Date for Recertification  11/17/23    Authorization Type Medicare    Authorization - Visit Number 27    Authorization - Number of Visits 36    PT Start Time 1145    PT Stop Time 1228    PT Time Calculation (min) 43 min    Activity Tolerance Patient tolerated treatment well    Behavior During Therapy [facility] for tasks assessed/performed                  Past Medical History:  Diagnosis Date   Anxiety    Arthritis    Atrial flutter ([name])    Cancer ([name])    Carotid artery disease 05/25/2022   50-69% RICA stenosis, LICA occlusion justpast the carotid bulb by 05/25/22 CTA   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   CKD (chronic kidney disease)    Complication of anesthesia    patient had dificulty waking up   Coronary artery disease    Diabetes mellitus without complication ([name])    Gout    Hyperlipidemia    Hypertension    Myocardial infarction ([name])    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   colonic pseudo-obstruction, s/p colectomy with ileostomy 12/2013   Overactive bladder    Sleep apnea    Stroke [facility])    Past Surgical History:  Procedure Laterality Date   CHOLECYSTECTOMY     COLOSTOMY     POSTERIOR CERVICAL LAMINECTOMY N/A 02/09/2023   Procedure: Laminectomy  Cervical two - Cervical seven;  Surgeon: Louis Shove, MD;  Location: [facility] OR;  Service: Neurosurgery;  Laterality: N/A;   PROSTATE SURGERY     Patient Active Problem List   Diagnosis Date Noted   Cervical spondylosis with myelopathy and radiculopathy 02/09/2023   Atopic dermatitis 10/28/2022   Throat clearing 09/16/2022   Dyspnea 07/07/2022   Insomnia 03/17/2022   Callus of foot, right heel 12/11/2021   Cervical cord myelomalacia ([name]) 10/30/2021   AV  block, 1st degree 08/14/2021   Sinus bradycardia on ECG 08/14/2021   OSA (obstructive sleep apnea) 08/01/2021   History of MI (myocardial infarction) 10/21/2019   Diversion colitis 10/07/2019   Gastroesophageal reflux disease without esophagitis 10/07/2019   PAF (paroxysmal atrial fibrillation) ([name]) 09/16/2019   Atrial flutter ([name]) 09/15/2019   NSTEMI (non-ST elevated myocardial infarction) ([name]) 09/15/2019   Recurrent major depressive disorder, in partial remission 09/15/2019   Coronary artery disease of native artery of native heart with stable angina pectoris 09/15/2019   Severe episode of recurrent major depressive disorder, without psychotic features ([name]) 09/15/2019   Asymptomatic PVCs 07/27/2018   CKD (chronic kidney disease) stage 3, GFR 30-59 ml/min ([name]) 04/27/2018   Mild cognitive impairment 04/20/2018   Microcytic anemia 04/20/2018   Primary osteoarthritis of both knees 02/17/2018   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   Type 2 diabetes mellitus with chronic kidney disease, without long-term current use of insulin  ([name]) 01/06/2018   Dyslipidemia associated with type 2 diabetes mellitus ([name]) 01/06/2018   OAB (overactive bladder) 01/06/2018   History of prostate cancer 01/06/2018   Lumbar spinal stenosis 12/15/2017   Colostomy status ([name]) 12/15/2017   Hypertension associated with diabetes ([name]) 12/15/2017   Gout 12/15/2017   History of CVA (cerebrovascular accident) 12/15/2017  Ileostomy in place [facility]) 12/15/2017    PCP: Alvia MART PROVIDER: Victory Gunnels  REFERRING DIAG: cervical spinal stenosis with myelopathy  THERAPY DIAG:  Muscle weakness (generalized)  Other symptoms and signs involving the musculoskeletal system  Rationale for Evaluation and Treatment: Rehabilitation  ONSET DATE: 02/09/24  SUBJECTIVE:                                                                                                                                                                                                          SUBJECTIVE STATEMENT: Pt continues to state I feel a little better   PERTINENT HISTORY:  Lumbar spinal stenosis CVA CKD NSTEMI  Pt is s/p cervical laminectomy C2-C7 on 02/09/24. Since the surgery he continues with pain in his neck and with Lt UE weakness and bilat UE numbness/tingling. Prior to surgery he used a PFRW for ambulation but he has had a few recent falls and is now using a power w/c for mobility in and out of the house. Pt reports difficulty using Lt UE for functional activities. He reports he drops items often due to numbness in hands. Numbness does not get better or worse, it is just always there.  Hand dominance: Right  PAIN:  Are you having pain? Yes: NPRS scale: 5/10 currently, 9/10 at worst Pain location: neck, LT UE Pain description: sore, ache Aggravating factors: increased use of Lt UE Relieving factors: meds  PRECAUTIONS: Fall  RED FLAGS: None     WEIGHT BEARING RESTRICTIONS: No  FALLS:  Has patient fallen in last 6 months? Yes. Number of falls 2-3  LIVING ENVIRONMENT: Lives with: lives with their family Lives in: House/apartment Stairs: No Has following equipment at home: Environmental consultant - 2 wheeled and Wheelchair (power)  OCCUPATION: retired  PLOF: Independent with basic ADLs, Independent with household mobility with device, and Requires assistive device for independence  PATIENT GOALS: decrease pain, get Lt UE to work, decrease numbness  NEXT MD VISIT: 09/05/23 [place])  OBJECTIVE:  Note: Objective measures were completed at Evaluation unless otherwise noted.  DIAGNOSTIC FINDINGS:  None on file since surgery  PATIENT SURVEYS:  NDI 26/50 - 52%  COGNITION: Overall cognitive status: History of cognitive impairments - at baseline  SENSATION: Light touch: Impaired   POSTURE: forward head  PALPATION: Increased mm spasticity Rt > Lt cervical paraspinals Increased tenderness to light touch  throughout cervical musculature, UT bilat Some decreased symptoms with manual traction   CERVICAL ROM:   Active ROM A/PROM (deg) eval AROM 07/28/23  Flexion 34 30  Extension 14 pain  10  Right lateral flexion 35 28  Left lateral flexion 25 33  Right rotation 38 55  Left rotation 40 57   (Blank rows = not tested)  UPPER EXTREMITY MMT:  MMT Right eval Left eval Right 07/14/23 Left 07/14/23 Rt/Lt 07/28/23 Rt/Lt 09/22/23  Shoulder flexion 4 3- 4 4- 4-/4- 4/4-  Shoulder extension        Shoulder abduction 4 3- 4 4- 4/4- 4/4-  Shoulder adduction        Shoulder extension        Shoulder internal rotation        Shoulder external rotation        Elbow flexion 4+ 3+ 4+ 4- 4+/4- 4+/4  Elbow extension 4+ 3 4+ 4+ 4+/4- 4+/4  Wrist flexion        Wrist extension        Wrist ulnar deviation        Wrist radial deviation        Wrist pronation        GRIP STRENGTH 50 19 60 53  65/45   (Blank rows = not tested)  UPPER EXTREMITY ROM:  Active ROM Right eval Left eval Left 10/27/23  Shoulder flexion WFL 60 90  Shoulder extension     Shoulder abduction WFL 85 pain   Shoulder adduction     Shoulder extension     Shoulder internal rotation     Shoulder external rotation     Elbow flexion     Elbow extension     Wrist flexion     Wrist extension     Wrist ulnar deviation     Wrist radial deviation     Wrist pronation     Wrist supination      (Blank rows = not tested)  LOWER EXTREMITY MMT:    MMT Right eval Left eval Rt/Lt  09/22/23  Hip flexion 4- 4- 4/3+  Hip extension     Hip abduction     Hip adduction     Hip internal rotation     Hip external rotation     Knee flexion 4 4 4/4-  Knee extension 4- 4- 4/4-  Ankle dorsiflexion 4- 4- 4-/3+  Ankle plantarflexion     Ankle inversion     Ankle eversion      (Blank rows = not tested)  CERVICAL SPECIAL TESTS:  Spurling's test: Positive  5 x STS with UE support = 18.15 seconds (07/28/23), 22.31 seconds (09/22/23) TUG (no  AD): 21.84 seconds (07/28/23) 23.21 seconds (09/22/23)   [facility] Adult PT Treatment:                                                DATE: 11/10/23 Therapeutic Exercise/Activity: Nustep L6 x 5 min Standing Row 10# x 20 Shoulder ext 10# x 20 1 arm row 7.5# x12 Standing with back against wall, towel against wall behind head to cue cervical and trunk extension: shoulder flexion with dowel, backward shoulder rolls Side stepping at counter red TB Fwd/bkwd walking at counter red TB Step ups 6'' step 1 UE support 2 x 5 bilat Mini squat 10 x 3 sec hold Seated Backward shoulder rolls Thoracic extension Scap squeeze   [facility] Adult PT Treatment:  DATE: 11/03/23 Therapeutic Exercise/Activity: Nustep L6 x 5 min Standing Shoulder extension green TB x 20  Rows green TB x 20 Single arm rows green TB x 10 bilat Step ups 6'' step 1 UE support x 10 bilat Mini squat 15 x 3 sec hold Heel raise 15 x 3 sec hold Slow march x 15 Standing with back against wall: shld flexion with dowel, backward shoulder rolls, thoracic extension Seated Backward shoulder rolls Thoracic extension Overhead press with dowel Trunk ext with hands behind head Punches x 1 min 1# Bent arm abduction 2 x 10 1#   [facility] Adult PT Treatment:                                                DATE: 10/27/23 Therapeutic Exercise/Activity: Nustep L6 x 5 min Standing Side step at counter Step fwd/bkwd over object Step up with 1 UE support 6'' step 2 x 5 bilat Heel/toe raise x 15 Mini squat x 15 Shoulder wall slides 3 x 5 Seated Punches 1# x 10 Overhead press 1# x 10 on Rt, x 4 on Lt Bicep curl 1# x 20 Backward shoulder rolls Overhead press with dowel + trunk ext Trunk ext with hands behind head Bent arm abduction x 10   [facility] Adult PT Treatment:                                                DATE: 10/20/23 Therapeutic Exercise/Activity: Standing UBE L1 x 3 min alt fwd/bkwd Heel raise  15 x 3 sec hold Mini squat 10 x 3 sec hold Slow march x 15 Side step at counter Bkwd/fwd walking at counter Standing with pillow behind head for posture Seated Scap squeeze x 10 Trunk ext with hands behind head Overhead press 1# x 10 Bent arm shoulder abd 1# x 10 Punching 1# x 10   PATIENT EDUCATION:  Education details: Updated HEP Person educated: Patient Education method: Explanation, Demonstration, and Handouts Education comprehension: verbalized understanding and returned demonstration  HOME EXERCISE PROGRAM: Access Code: [email or web address] URL: [email or web address] Date: 09/22/2023 Prepared by: Darice Conine  Exercises - Dead Bug  - 1 x daily - 7 x weekly - 3 sets - 10 reps - Shoulder Flexion Wall Slide with Towel  - 1 x daily - 7 x weekly - 1 sets - 10 reps - 5-10 seconds hold - Sit to Stand with Armchair  - 1 x daily - 7 x weekly - 2 sets - 10 reps - Standing Gastroc Stretch  - 2 x daily - 7 x weekly - 1 sets - 5-3 reps - 10-30 sec hold - Heel Toe Raises with Counter Support  - 1 x daily - 7 x weekly - 1 sets - 10 reps - 3-5 seconds hold - Seated March with Resistance  - 1 x daily - 7 x weekly - 1 sets - 10 reps - 3-5 seconds hold - Standing Hip Abduction with Counter Support  - 1 x daily - 7 x weekly - 1 sets - 10 reps - 3-5 seconds hold - Standing Hip Extension with Counter Support  - 1 x daily - 7 x weekly - 1 sets - 10 reps - 3-5 seconds hold - Standing Hamstring  Curl with Chair Support  - 1 x daily - 7 x weekly - 1 sets - 10 reps - 3-5 seconds hold - Mini Squat with Counter Support  - 1 x daily - 7 x weekly - 1 sets - 10 reps - 3-5 seconds hold  ASSESSMENT:  CLINICAL IMPRESSION: Pt requires more frequent seated rest breaks today due to c/o LE weakness. He continues to improve strength and is able to progress to use of matrix machine for exercising. Still limited by decreased muscular endurance. Plan to d/c to HEP after next visit   OBJECTIVE IMPAIRMENTS:  decreased activity tolerance, decreased mobility, decreased ROM, decreased strength, increased muscle spasms, impaired sensation, impaired UE functional use, postural dysfunction, and pain.     GOALS: Goals reviewed with patient? Yes  SHORT TERM GOALS: Target date: 07/01/2023  Pt will be independent in initial HEP Baseline:  Goal status: MET  2.  Pt will improve Lt UE AROM to 90 degrees flexion and 100 degrees abduction Baseline: see above 06/30/23: flexion 108 deg, abd 76 deg Goal status: IN PROGRESS   LONG TERM GOALS: Target date: 11/17/2023    Pt will be independent with advanced HEP Baseline:  Goal status: IN PROGRESS  2.  NDI will improve to <= 16/50 to demo improved functional mobility Baseline: 26/50 07/14/23: 22/50 = 44.0% 08/18/23: 20/50 40% Goal status: IN PROGRESS  3.  Pt will improve Lt UE strength to 4+/5 to improve functional activity tolerance Baseline: see above 07/14/23: see above Goal status: UPDATED  4.  Pt will report 50% reduction in numbness in bilat UEs Baseline:  07/07/23: no change 07/14/23: no change 07/28/23: 10% better 08/18/23: 25% better Goal status: NOT MET  5.  Pt will demo improved LE strength with 5 x STS with UE support <= 15 seconds Baseline:  Goal status: INITIAL  6.  Pt will improve TUG to <= 16 seconds to demo improved gait and balance Baseline:  Goal status: INITIAL   PLAN:  PT FREQUENCY: 2x/week  PT DURATION: 8 weeks  PLANNED INTERVENTIONS: [id number]- PT Re-evaluation, 97110-Therapeutic exercises, 97530- Therapeutic activity, [id number]- Neuromuscular re-education, 97535- Self Care, [id number]- Manual therapy, [id number]- Aquatic Therapy, [id number]- Electrical stimulation (unattended), Patient/Family education, Taping, Dry Needling, Cryotherapy, and Moist heat  PLAN FOR NEXT SESSION: finalize HEP, d/c   Shereta Crothers, PT 11/10/2023, 12:31 PM

## 2023-11-17 ENCOUNTER — Ambulatory Visit: Attending: Neurosurgery | Admitting: Physical Therapy

## 2023-11-17 DIAGNOSIS — R29898 Other symptoms and signs involving the musculoskeletal system: Secondary | ICD-10-CM | POA: Insufficient documentation

## 2023-11-17 DIAGNOSIS — M6281 Muscle weakness (generalized): Secondary | ICD-10-CM | POA: Insufficient documentation

## 2023-11-20 DIAGNOSIS — I10 Essential (primary) hypertension: Secondary | ICD-10-CM | POA: Diagnosis not present

## 2023-11-20 DIAGNOSIS — W19XXXA Unspecified fall, initial encounter: Secondary | ICD-10-CM | POA: Diagnosis not present

## 2023-11-20 DIAGNOSIS — S0990XA Unspecified injury of head, initial encounter: Secondary | ICD-10-CM | POA: Diagnosis not present

## 2023-11-20 DIAGNOSIS — R531 Weakness: Secondary | ICD-10-CM | POA: Diagnosis not present

## 2023-11-20 DIAGNOSIS — G4489 Other headache syndrome: Secondary | ICD-10-CM | POA: Diagnosis not present

## 2023-11-20 DIAGNOSIS — M542 Cervicalgia: Secondary | ICD-10-CM | POA: Diagnosis not present

## 2023-11-20 DIAGNOSIS — S161XXA Strain of muscle, fascia and tendon at neck level, initial encounter: Secondary | ICD-10-CM | POA: Diagnosis not present

## 2023-11-22 ENCOUNTER — Other Ambulatory Visit: Payer: Self-pay | Admitting: Family Medicine

## 2023-11-24 ENCOUNTER — Ambulatory Visit: Payer: Self-pay | Admitting: Physical Therapy

## 2023-11-24 ENCOUNTER — Encounter: Payer: Self-pay | Admitting: Physical Therapy

## 2023-11-24 DIAGNOSIS — M6281 Muscle weakness (generalized): Secondary | ICD-10-CM | POA: Diagnosis not present

## 2023-11-24 DIAGNOSIS — Z23 Encounter for immunization: Secondary | ICD-10-CM | POA: Diagnosis not present

## 2023-11-24 DIAGNOSIS — R29898 Other symptoms and signs involving the musculoskeletal system: Secondary | ICD-10-CM

## 2023-11-24 NOTE — Therapy (Signed)
 OUTPATIENT PHYSICAL THERAPY CERVICAL TREATMENT DISCHARGE  Patient Name: Damon Acosta MRN: [id number] DOB:Oct 11, 1940, 83 y.o., male Today's Date: 11/24/2023  END OF SESSION:  PT End of Session - 11/24/23 1221     Visit Number 28    Number of Visits 36    Date for Recertification  11/17/23    Authorization Type Medicare    Authorization - Visit Number 28    PT Start Time 1145    PT Stop Time 1219    PT Time Calculation (min) 34 min    Activity Tolerance Patient tolerated treatment well    Behavior During Therapy [facility] for tasks assessed/performed                   Past Medical History:  Diagnosis Date   Anxiety    Arthritis    Atrial flutter ([name])    Cancer ([name])    Carotid artery disease 05/25/2022   50-69% RICA stenosis, LICA occlusion justpast the carotid bulb by 05/25/22 CTA   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   CKD (chronic kidney disease)    Complication of anesthesia    patient had dificulty waking up   Coronary artery disease    Diabetes mellitus without complication ([name])    Gout    Hyperlipidemia    Hypertension    Myocardial infarction ([name])    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   colonic pseudo-obstruction, s/p colectomy with ileostomy 12/2013   Overactive bladder    Sleep apnea    Stroke [facility])    Past Surgical History:  Procedure Laterality Date   CHOLECYSTECTOMY     COLOSTOMY     POSTERIOR CERVICAL LAMINECTOMY N/A 02/09/2023   Procedure: Laminectomy  Cervical two - Cervical seven;  Surgeon: Louis Shove, MD;  Location: [facility] OR;  Service: Neurosurgery;  Laterality: N/A;   PROSTATE SURGERY     Patient Active Problem List   Diagnosis Date Noted   Cervical spondylosis with myelopathy and radiculopathy 02/09/2023   Atopic dermatitis 10/28/2022   Throat clearing 09/16/2022   Dyspnea 07/07/2022   Insomnia 03/17/2022   Callus of foot, right heel 12/11/2021   Cervical cord myelomalacia ([name]) 10/30/2021   AV block, 1st degree  08/14/2021   Sinus bradycardia on ECG 08/14/2021   OSA (obstructive sleep apnea) 08/01/2021   History of MI (myocardial infarction) 10/21/2019   Diversion colitis 10/07/2019   Gastroesophageal reflux disease without esophagitis 10/07/2019   PAF (paroxysmal atrial fibrillation) ([name]) 09/16/2019   Atrial flutter ([name]) 09/15/2019   NSTEMI (non-ST elevated myocardial infarction) ([name]) 09/15/2019   Recurrent major depressive disorder, in partial remission 09/15/2019   Coronary artery disease of native artery of native heart with stable angina pectoris 09/15/2019   Severe episode of recurrent major depressive disorder, without psychotic features ([name]) 09/15/2019   Asymptomatic PVCs 07/27/2018   CKD (chronic kidney disease) stage 3, GFR 30-59 ml/min ([name]) 04/27/2018   Mild cognitive impairment 04/20/2018   Microcytic anemia 04/20/2018   Primary osteoarthritis of both knees 02/17/2018   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   Type 2 diabetes mellitus with chronic kidney disease, without long-term current use of insulin  ([name]) 01/06/2018   Dyslipidemia associated with type 2 diabetes mellitus ([name]) 01/06/2018   OAB (overactive bladder) 01/06/2018   History of prostate cancer 01/06/2018   Lumbar spinal stenosis 12/15/2017   Colostomy status ([name]) 12/15/2017   Hypertension associated with diabetes ([name]) 12/15/2017   Gout 12/15/2017   History of CVA (cerebrovascular accident) 12/15/2017   Ileostomy in place [facility]) 12/15/2017  PCP: Alvia MART PROVIDER: Victory Gunnels  REFERRING DIAG: cervical spinal stenosis with myelopathy  THERAPY DIAG:  Muscle weakness (generalized)  Other symptoms and signs involving the musculoskeletal system  Rationale for Evaluation and Treatment: Rehabilitation  ONSET DATE: 02/09/24  SUBJECTIVE:                                                                                                                                                                                                          SUBJECTIVE STATEMENT: Pt states My neck is a little better   PERTINENT HISTORY:  Lumbar spinal stenosis CVA CKD NSTEMI  Pt is s/p cervical laminectomy C2-C7 on 02/09/24. Since the surgery he continues with pain in his neck and with Lt UE weakness and bilat UE numbness/tingling. Prior to surgery he used a PFRW for ambulation but he has had a few recent falls and is now using a power w/c for mobility in and out of the house. Pt reports difficulty using Lt UE for functional activities. He reports he drops items often due to numbness in hands. Numbness does not get better or worse, it is just always there.  Hand dominance: Right  PAIN:  Are you having pain? Yes: NPRS scale: 4/10 currently, 9/10 at worst Pain location: neck, LT UE Pain description: sore, ache Aggravating factors: increased use of Lt UE Relieving factors: meds  PRECAUTIONS: Fall  RED FLAGS: None     WEIGHT BEARING RESTRICTIONS: No  FALLS:  Has patient fallen in last 6 months? Yes. Number of falls 2-3  LIVING ENVIRONMENT: Lives with: lives with their family Lives in: House/apartment Stairs: No Has following equipment at home: Environmental consultant - 2 wheeled and Wheelchair (power)  OCCUPATION: retired  PLOF: Independent with basic ADLs, Independent with household mobility with device, and Requires assistive device for independence  PATIENT GOALS: decrease pain, get Lt UE to work, decrease numbness  NEXT MD VISIT: 09/05/23 [place])  OBJECTIVE:  Note: Objective measures were completed at Evaluation unless otherwise noted.  DIAGNOSTIC FINDINGS:  None on file since surgery  PATIENT SURVEYS:  NDI 26/50 - 52%  COGNITION: Overall cognitive status: History of cognitive impairments - at baseline  SENSATION: Light touch: Impaired   POSTURE: forward head  PALPATION: Increased mm spasticity Rt > Lt cervical paraspinals Increased tenderness to light touch throughout cervical musculature,  UT bilat Some decreased symptoms with manual traction   CERVICAL ROM:   Active ROM A/PROM (deg) eval AROM 07/28/23  Flexion 34 30  Extension 14 pain 10  Right lateral flexion 35 28  Left  lateral flexion 25 33  Right rotation 38 55  Left rotation 40 57   (Blank rows = not tested)  UPPER EXTREMITY MMT:  MMT Right eval Left eval Right 07/14/23 Left 07/14/23 Rt/Lt 07/28/23 Rt/Lt 09/22/23 Rt/Lt 11/24/23  Shoulder flexion 4 3- 4 4- 4-/4- 4/4- 4+/4-  Shoulder extension         Shoulder abduction 4 3- 4 4- 4/4- 4/4- 4+/4-  Shoulder adduction         Shoulder extension         Shoulder internal rotation         Shoulder external rotation         Elbow flexion 4+ 3+ 4+ 4- 4+/4- 4+/4 4+/4  Elbow extension 4+ 3 4+ 4+ 4+/4- 4+/4 4+/4  Wrist flexion         Wrist extension         Wrist ulnar deviation         Wrist radial deviation         Wrist pronation         GRIP STRENGTH 50 19 60 53  65/45    (Blank rows = not tested)  UPPER EXTREMITY ROM:  Active ROM Right eval Left eval Left 10/27/23  Shoulder flexion WFL 60 90  Shoulder extension     Shoulder abduction WFL 85 pain   Shoulder adduction     Shoulder extension     Shoulder internal rotation     Shoulder external rotation     Elbow flexion     Elbow extension     Wrist flexion     Wrist extension     Wrist ulnar deviation     Wrist radial deviation     Wrist pronation     Wrist supination      (Blank rows = not tested)  LOWER EXTREMITY MMT:    MMT Right eval Left eval Rt/Lt  09/22/23  Hip flexion 4- 4- 4/3+  Hip extension     Hip abduction     Hip adduction     Hip internal rotation     Hip external rotation     Knee flexion 4 4 4/4-  Knee extension 4- 4- 4/4-  Ankle dorsiflexion 4- 4- 4-/3+  Ankle plantarflexion     Ankle inversion     Ankle eversion      (Blank rows = not tested)  CERVICAL SPECIAL TESTS:  Spurling's test: Positive  5 x STS with UE support = 18.15 seconds (07/28/23), 22.31 seconds  (09/22/23), 15.88 seconds (11/24/23) TUG (no AD): 21.84 seconds (07/28/23) 23.21 seconds (09/22/23), 15.38 seconds (11/24/23)   [facility] Adult PT Treatment:                                                DATE: 11/24/23 Therapeutic Exercise/Activity: MMT (see above) 5 x STS = 15.88 seconds TUG = 15.38 seconds Mini squat at counter 10 x 3 sec hold Heel/toe raise at counter x 10 Side step at counter red TB Fwd/bkwd walking at counter Discussion of finalized HEP and recommendations for d/c  [facility] Adult PT Treatment:  DATE: 11/10/23 Therapeutic Exercise/Activity: Nustep L6 x 5 min Standing Row 10# x 20 Shoulder ext 10# x 20 1 arm row 7.5# x12 Standing with back against wall, towel against wall behind head to cue cervical and trunk extension: shoulder flexion with dowel, backward shoulder rolls Side stepping at counter red TB Fwd/bkwd walking at counter red TB Step ups 6'' step 1 UE support 2 x 5 bilat Mini squat 10 x 3 sec hold Seated Backward shoulder rolls Thoracic extension Scap squeeze   [facility] Adult PT Treatment:                                                DATE: 11/03/23 Therapeutic Exercise/Activity: Nustep L6 x 5 min Standing Shoulder extension green TB x 20  Rows green TB x 20 Single arm rows green TB x 10 bilat Step ups 6'' step 1 UE support x 10 bilat Mini squat 15 x 3 sec hold Heel raise 15 x 3 sec hold Slow march x 15 Standing with back against wall: shld flexion with dowel, backward shoulder rolls, thoracic extension Seated Backward shoulder rolls Thoracic extension Overhead press with dowel Trunk ext with hands behind head Punches x 1 min 1# Bent arm abduction 2 x 10 1#   [facility] Adult PT Treatment:                                                DATE: 10/27/23 Therapeutic Exercise/Activity: Nustep L6 x 5 min Standing Side step at counter Step fwd/bkwd over object Step up with 1 UE support 6'' step 2 x 5  bilat Heel/toe raise x 15 Mini squat x 15 Shoulder wall slides 3 x 5 Seated Punches 1# x 10 Overhead press 1# x 10 on Rt, x 4 on Lt Bicep curl 1# x 20 Backward shoulder rolls Overhead press with dowel + trunk ext Trunk ext with hands behind head Bent arm abduction x 10   [facility] Adult PT Treatment:                                                DATE: 10/20/23 Therapeutic Exercise/Activity: Standing UBE L1 x 3 min alt fwd/bkwd Heel raise 15 x 3 sec hold Mini squat 10 x 3 sec hold Slow march x 15 Side step at counter Bkwd/fwd walking at counter Standing with pillow behind head for posture Seated Scap squeeze x 10 Trunk ext with hands behind head Overhead press 1# x 10 Bent arm shoulder abd 1# x 10 Punching 1# x 10   PATIENT EDUCATION:  Education details: Updated HEP Person educated: Patient Education method: Explanation, Demonstration, and Handouts Education comprehension: verbalized understanding and returned demonstration  HOME EXERCISE PROGRAM: Access Code: [email or web address] URL: [email or web address] Date: 09/22/2023 Prepared by: Darice Conine  Exercises - Dead Bug  - 1 x daily - 7 x weekly - 3 sets - 10 reps - Shoulder Flexion Wall Slide with Towel  - 1 x daily - 7 x weekly - 1 sets - 10 reps - 5-10 seconds hold - Sit to Stand with Armchair  - 1 x daily -  7 x weekly - 2 sets - 10 reps - Standing Gastroc Stretch  - 2 x daily - 7 x weekly - 1 sets - 5-3 reps - 10-30 sec hold - Heel Toe Raises with Counter Support  - 1 x daily - 7 x weekly - 1 sets - 10 reps - 3-5 seconds hold - Seated March with Resistance  - 1 x daily - 7 x weekly - 1 sets - 10 reps - 3-5 seconds hold - Standing Hip Abduction with Counter Support  - 1 x daily - 7 x weekly - 1 sets - 10 reps - 3-5 seconds hold - Standing Hip Extension with Counter Support  - 1 x daily - 7 x weekly - 1 sets - 10 reps - 3-5 seconds hold - Standing Hamstring Curl with Chair Support  - 1 x daily - 7 x weekly - 1  sets - 10 reps - 3-5 seconds hold - Mini Squat with Counter Support  - 1 x daily - 7 x weekly - 1 sets - 10 reps - 3-5 seconds hold  ASSESSMENT:  CLINICAL IMPRESSION: Pt with good improvements with 5 x STS and TUG demonstrating improved LE strength and functional mobility. He continues with decreased UE strength and neck pain but feels comfortable with HEP. He is ready to d/c.   OBJECTIVE IMPAIRMENTS: decreased activity tolerance, decreased mobility, decreased ROM, decreased strength, increased muscle spasms, impaired sensation, impaired UE functional use, postural dysfunction, and pain.     GOALS: Goals reviewed with patient? Yes  SHORT TERM GOALS: Target date: 07/01/2023  Pt will be independent in initial HEP Baseline:  Goal status: MET  2.  Pt will improve Lt UE AROM to 90 degrees flexion and 100 degrees abduction Baseline: see above 06/30/23: flexion 108 deg, abd 76 deg Goal status: IN PROGRESS   LONG TERM GOALS: Target date: 11/17/2023    Pt will be independent with advanced HEP Baseline:  Goal status: MET  2.  NDI will improve to <= 16/50 to demo improved functional mobility Baseline: 26/50 07/14/23: 22/50 = 44.0% 08/18/23: 20/50 40% Goal status: NOT MET  3.  Pt will improve Lt UE strength to 4+/5 to improve functional activity tolerance Baseline: see above 07/14/23: see above Goal status: NOT MET  4.  Pt will report 50% reduction in numbness in bilat UEs Baseline:  07/07/23: no change 07/14/23: no change 07/28/23: 10% better 08/18/23: 25% better Goal status: NOT MET  5.  Pt will demo improved LE strength with 5 x STS with UE support <= 15 seconds Baseline:  Goal status: NOT MET  6.  Pt will improve TUG to <= 16 seconds to demo improved gait and balance Baseline:  Goal status: MET   PLAN:  PT FREQUENCY: 2x/week  PT DURATION: 8 weeks  PLANNED INTERVENTIONS: 97164- PT Re-evaluation, 97110-Therapeutic exercises, 97530- Therapeutic activity, 97112-  Neuromuscular re-education, 97535- Self Care, [id number]- Manual therapy, [phone]- Aquatic Therapy, [id number]- Electrical stimulation (unattended), Patient/Family education, Taping, Dry Needling, Cryotherapy, and Moist heat  PLAN FOR NEXT SESSION: d/c   Tyreke Kaeser, PT 11/24/2023, 12:22 PM  PHYSICAL THERAPY DISCHARGE SUMMARY  Visits from Start of Care: 28  Current functional level related to goals / functional outcomes: Improving strength and functional moblity   Remaining deficits: See above   Education / Equipment: HEP   Patient agrees to discharge. Patient goals were partially met. Patient is being discharged due to being pleased with the current functional level.

## 2023-12-02 ENCOUNTER — Other Ambulatory Visit: Payer: Self-pay | Admitting: Family Medicine

## 2023-12-03 DIAGNOSIS — G992 Myelopathy in diseases classified elsewhere: Secondary | ICD-10-CM | POA: Diagnosis not present

## 2023-12-10 ENCOUNTER — Other Ambulatory Visit: Payer: Self-pay | Admitting: Family Medicine

## 2023-12-10 NOTE — Telephone Encounter (Signed)
 Copied from CRM #[id number]. Topic: Clinical - Medication Refill >> Dec 10, 2023 12:20 PM Dedra B wrote: Medication: traZODone  (DESYREL ) 50 MG tablet   Has the patient contacted their pharmacy? Yes, contact office for new prescription  This is the patient's preferred pharmacy:  [phone] - [place], [place] - [address] [place] [address] Phone: [phone] Fax: [phone]  Is this the correct pharmacy for this prescription? Yes   Has the prescription been filled recently? No  Is the patient out of the medication? Yes  Has the patient been seen for an appointment in the last year OR does the patient have an upcoming appointment? Yes  Can we respond through MyChart? Yes  Agent: Please be advised that Rx refills may take up to 3 business days. We ask that you follow-up with your pharmacy.

## 2023-12-14 MED ORDER — TRAZODONE HCL 50 MG PO TABS: 50.0000 mg | ORAL_TABLET | Freq: Every evening | ORAL | 2 refills | Status: DC | PRN

## 2023-12-16 ENCOUNTER — Other Ambulatory Visit: Payer: Self-pay | Admitting: Neurosurgery

## 2023-12-16 ENCOUNTER — Telehealth: Payer: Self-pay

## 2023-12-16 DIAGNOSIS — G992 Myelopathy in diseases classified elsewhere: Secondary | ICD-10-CM

## 2023-12-16 NOTE — Telephone Encounter (Signed)
 Copied from CRM [id number]. Topic: Clinical - Medication Question >> Dec 16, 2023  3:48 PM Myrick T wrote: Reason for CRM: patient is having an MRI done on 11/9 and need something called into his pharmacy [facility] [address] to calm his nerves. Please f/u with patient

## 2023-12-17 NOTE — Telephone Encounter (Signed)
 Patient informed and will reach out to Damon Acosta to make this request. He will give  Us  a call with anything further we can assist with.

## 2023-12-18 ENCOUNTER — Other Ambulatory Visit: Payer: Self-pay | Admitting: Family Medicine

## 2023-12-18 DIAGNOSIS — M48062 Spinal stenosis, lumbar region with neurogenic claudication: Secondary | ICD-10-CM

## 2023-12-18 MED ORDER — DIAZEPAM 5 MG PO TABS: ORAL_TABLET | ORAL | 0 refills | Status: DC

## 2023-12-18 NOTE — Progress Notes (Signed)
 Received message from radiology that patient was unable to get in touch with neurosurgeon and has MRI scheduled for Sunday.  Sending diazepam  1-2 tablets prior to procedure.

## 2023-12-20 ENCOUNTER — Ambulatory Visit

## 2023-12-20 DIAGNOSIS — M5441 Lumbago with sciatica, right side: Secondary | ICD-10-CM | POA: Diagnosis not present

## 2023-12-20 DIAGNOSIS — M5442 Lumbago with sciatica, left side: Secondary | ICD-10-CM

## 2023-12-20 DIAGNOSIS — M47817 Spondylosis without myelopathy or radiculopathy, lumbosacral region: Secondary | ICD-10-CM | POA: Diagnosis not present

## 2023-12-20 DIAGNOSIS — M4807 Spinal stenosis, lumbosacral region: Secondary | ICD-10-CM | POA: Diagnosis not present

## 2023-12-20 DIAGNOSIS — M5137 Other intervertebral disc degeneration, lumbosacral region with discogenic back pain only: Secondary | ICD-10-CM | POA: Diagnosis not present

## 2023-12-20 DIAGNOSIS — M47816 Spondylosis without myelopathy or radiculopathy, lumbar region: Secondary | ICD-10-CM | POA: Diagnosis not present

## 2023-12-20 DIAGNOSIS — G992 Myelopathy in diseases classified elsewhere: Secondary | ICD-10-CM

## 2023-12-24 ENCOUNTER — Other Ambulatory Visit: Payer: Self-pay | Admitting: Neurosurgery

## 2023-12-24 DIAGNOSIS — G992 Myelopathy in diseases classified elsewhere: Secondary | ICD-10-CM

## 2023-12-25 ENCOUNTER — Other Ambulatory Visit: Payer: Self-pay

## 2023-12-25 ENCOUNTER — Telehealth: Payer: Self-pay | Admitting: Family Medicine

## 2023-12-25 DIAGNOSIS — M48062 Spinal stenosis, lumbar region with neurogenic claudication: Secondary | ICD-10-CM

## 2023-12-25 MED ORDER — DIAZEPAM 5 MG PO TABS: ORAL_TABLET | ORAL | 0 refills | Status: DC

## 2023-12-25 NOTE — Telephone Encounter (Signed)
 Damon Acosta stopped by. [name] is having an MRI on Monday is requesting med to relax him during this test.

## 2023-12-25 NOTE — Progress Notes (Unsigned)
 Needed for upcoming MRI

## 2023-12-28 ENCOUNTER — Ambulatory Visit

## 2023-12-28 DIAGNOSIS — R29898 Other symptoms and signs involving the musculoskeletal system: Secondary | ICD-10-CM | POA: Diagnosis not present

## 2023-12-28 DIAGNOSIS — M79602 Pain in left arm: Secondary | ICD-10-CM | POA: Diagnosis not present

## 2023-12-28 DIAGNOSIS — G992 Myelopathy in diseases classified elsewhere: Secondary | ICD-10-CM | POA: Diagnosis not present

## 2023-12-28 DIAGNOSIS — M549 Dorsalgia, unspecified: Secondary | ICD-10-CM | POA: Diagnosis not present

## 2024-01-04 DIAGNOSIS — M48062 Spinal stenosis, lumbar region with neurogenic claudication: Secondary | ICD-10-CM | POA: Diagnosis not present

## 2024-01-04 DIAGNOSIS — G992 Myelopathy in diseases classified elsewhere: Secondary | ICD-10-CM | POA: Diagnosis not present

## 2024-01-04 DIAGNOSIS — Z6827 Body mass index (BMI) 27.0-27.9, adult: Secondary | ICD-10-CM | POA: Diagnosis not present

## 2024-01-25 ENCOUNTER — Other Ambulatory Visit: Payer: Self-pay | Admitting: Family Medicine

## 2024-02-15 ENCOUNTER — Encounter: Payer: Self-pay | Admitting: Family Medicine

## 2024-02-15 ENCOUNTER — Ambulatory Visit: Admitting: Family Medicine

## 2024-02-15 VITALS — BP 107/48 | HR 77 | Ht 69.0 in | Wt 169.0 lb

## 2024-02-15 DIAGNOSIS — E785 Hyperlipidemia, unspecified: Secondary | ICD-10-CM

## 2024-02-15 DIAGNOSIS — Z8673 Personal history of transient ischemic attack (TIA), and cerebral infarction without residual deficits: Secondary | ICD-10-CM | POA: Diagnosis not present

## 2024-02-15 DIAGNOSIS — I4891 Unspecified atrial fibrillation: Secondary | ICD-10-CM | POA: Diagnosis not present

## 2024-02-15 DIAGNOSIS — E1122 Type 2 diabetes mellitus with diabetic chronic kidney disease: Secondary | ICD-10-CM

## 2024-02-15 DIAGNOSIS — N1832 Chronic kidney disease, stage 3b: Secondary | ICD-10-CM | POA: Diagnosis not present

## 2024-02-15 DIAGNOSIS — I152 Hypertension secondary to endocrine disorders: Secondary | ICD-10-CM | POA: Diagnosis not present

## 2024-02-15 DIAGNOSIS — M48062 Spinal stenosis, lumbar region with neurogenic claudication: Secondary | ICD-10-CM | POA: Diagnosis not present

## 2024-02-15 DIAGNOSIS — E1159 Type 2 diabetes mellitus with other circulatory complications: Secondary | ICD-10-CM

## 2024-02-15 DIAGNOSIS — E1169 Type 2 diabetes mellitus with other specified complication: Secondary | ICD-10-CM | POA: Diagnosis not present

## 2024-02-15 LAB — POCT GLYCOSYLATED HEMOGLOBIN (HGB A1C): HbA1c, POC (controlled diabetic range): 5.6 % (ref 0.0–7.0)

## 2024-02-15 MED ORDER — PREGABALIN 75 MG PO CAPS: 75.0000 mg | ORAL_CAPSULE | Freq: Two times a day (BID) | ORAL | 3 refills | Status: AC

## 2024-02-15 MED ORDER — METHOCARBAMOL 500 MG PO TABS: 500.0000 mg | ORAL_TABLET | Freq: Three times a day (TID) | ORAL | 0 refills | Status: AC

## 2024-02-15 NOTE — Patient Instructions (Addendum)
 Let's try adding pregabalin  (lyrica ) to help with pain.  Robaxin  as needed for muscle spasm.  Follow up in 6-8 weeks.    Have Tdap (tetanus) shot completed at your pharmacy.   Schedule updated appt with Podiatry.

## 2024-02-15 NOTE — Assessment & Plan Note (Signed)
 Diabetes remains well controlled.  Recommend continuation of Tradjenta  at current strength.  Continue losartan  at current strength for associated CKD

## 2024-02-15 NOTE — Progress Notes (Signed)
 " Damon Acosta - 84 y.o. male MRN [id number]  Date of birth: 1940/08/07  Subjective Chief Complaint  Patient presents with   Diabetes   Hypertension    HPI Damon Acosta is a 84 y.o. male here today for follow up visit.   He reports that he is doing ok.SABRA   He continues to have some neck pain.  S/p surgical decompression.  Continues to have pain and spasm of the neck.  Has tried PT.  Some relief with heat and muscle relaxer.  He has had increased pain and weakness along with numbness in his lower extremities as well.  He did f/u with Dr. Malcolm and had MRI of thoracic and lumbar spine.  Recommended to have surgery on lumbar spine due to spinal stenosis noted on lumbar MRI.  He does not want to have this done.   Diabetes is managed with tradjenta .  His A1c today is 5.6%.  He denies side effects from Tradjenta  at current strength.    BP has been well controlled.  Remains on losartan  and metoprolol .  A. Fib remains well controlled.  He remains anticoagulated with eliquis . No signs or symptoms of bleeding.    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   CKD (chronic kidney disease)    Complication of anesthesia    patient had dificulty waking up   Coronary artery disease    Diabetes mellitus without complication ([name])    Gout    Hyperlipidemia    Hypertension    Myocardial infarction ([name])    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   colonic pseudo-obstruction, s/p colectomy with ileostomy 12/2013   Overactive bladder    Sleep apnea    Stroke [facility])     Past Surgical History:  Procedure Laterality Date   CHOLECYSTECTOMY     COLOSTOMY     POSTERIOR CERVICAL LAMINECTOMY  N/A 02/09/2023   Procedure: Laminectomy  Cervical two - Cervical seven;  Surgeon: Louis Shove, MD;  Location: [facility] OR;  Service: Neurosurgery;  Laterality: N/A;   PROSTATE SURGERY      Social History   Socioeconomic History   Marital status: Married    Spouse name: Not on file   Number of children: Not on file   Years of education: Not on file   Highest education level: Not on file  Occupational History   Not on file  Tobacco Use   Smoking status: Former   Smokeless tobacco: Never  Vaping Use   Vaping status: Never Used  Substance and Sexual Activity   Alcohol use: Not Currently   Drug use: Never   Sexual activity: Not Currently  Other Topics Concern   Not on file  Social History Narrative   Not on file   Social Drivers of Health   Tobacco Use: Medium Risk (02/15/2024)   Patient History    Smoking Tobacco Use: Former    Smokeless Tobacco Use: Never    Passive Exposure: Not on file  Financial Resource Strain: Low Risk (07/31/2021)   Received from [facility]   Overall Financial Resource Strain (CARDIA)    Difficulty of Paying Living Expenses: Not hard at  all  Food Insecurity: No Food Insecurity (05/10/2023)   Received from [facility]   Epic    Within the past 12 months, you worried that your food would run out before you got the money to buy more.: Never true    Within the past 12 months, the food you bought just didn't last and you didn't have money to get more.: Never true  Transportation Needs: No Transportation Needs (05/12/2023)   Received from [facility]   PRAPARE - Transportation    Lack of Transportation (Medical): No    Lack of Transportation (Non-Medical): No  Physical Activity: Inactive (05/07/2023)   Exercise Vital Sign    Days of Exercise per Week: 0 days    Minutes of Exercise per Session: 0 min  Stress: No Stress Concern Present (05/10/2023)   Received from [facility] - Occupational Stress Questionnaire     Feeling of Stress : Not at all  Recent Concern: Stress - Stress Concern Present (05/07/2023)   [employer] of Occupational Health - Occupational Stress Questionnaire    Feeling of Stress : To some extent  Social Connections: Socially Integrated (05/07/2023)   Social Connection and Isolation Panel    Frequency of Communication with Friends and Family: More than three times a week    Frequency of Social Gatherings with Friends and Family: Once a week    Attends Religious Services: More than 4 times per year    Active Member of Clubs or Organizations: Yes    Attends Banker Meetings: 1 to 4 times per year    Marital Status: Married  Depression (PHQ2-9): Low Risk (02/15/2024)   Depression (PHQ2-9)    PHQ-2 Score: 1  Alcohol Screen: Low Risk (05/07/2023)   Alcohol Screen    Last Alcohol Screening Score (AUDIT): 0  Housing: Low Risk (05/10/2023)   Received from [facility]    In the last 12 months, was there a time when you were not able to pay the mortgage or rent on time?: No    In the past 12 months, how many times have you moved where you were living?: 0    At any time in the past 12 months, were you homeless or living in a shelter (including now)?: No  Utilities: Not At Risk (05/10/2023)   Received from [facility] Utilities    Threatened with loss of utilities: No  Health Literacy: Adequate Health Literacy (05/07/2023)   B1300 Health Literacy    Frequency of need for help with medical instructions: Rarely    Family History  Problem Relation Age of Onset   Diabetes Sister     Health Maintenance  Topic Date Due   Medicare Annual Wellness (AWV)  Never done   DTaP/Tdap/Td (1 - Tdap) Never done   Zoster Vaccines- Shingrix (2 of 2) 05/02/2021   OPHTHALMOLOGY EXAM  02/27/2022   COVID-19 Vaccine (7 - Moderna risk 2025-26 season) 05/24/2024   HEMOGLOBIN A1C  08/14/2024   Diabetic kidney evaluation - eGFR measurement  09/22/2024   Diabetic kidney  evaluation - Urine ACR  10/14/2024   FOOT EXAM  02/14/2025   Pneumococcal Vaccine: 50+ Years  Completed   Influenza Vaccine  Completed   Meningococcal B Vaccine  Aged Out     ----------------------------------------------------------------------------------------------------------------------------------------------------------------------------------------------------------------- Physical Exam BP (!) 107/48   Pulse 77   Ht 5' 9 (1.753 m)   Wt 169 lb (76.7 kg)  SpO2 97%   BMI 24.96 kg/m   Physical Exam Constitutional:      Appearance: Normal appearance.  HENT:     Head: Normocephalic and atraumatic.  Eyes:     General: No scleral icterus. Cardiovascular:     Rate and Rhythm: Normal rate and regular rhythm.  Pulmonary:     Effort: Pulmonary effort is normal.     Breath sounds: Normal breath sounds.  Musculoskeletal:     Cervical back: Neck supple.  Neurological:     Mental Status: He is alert.  Psychiatric:        Mood and Affect: Mood normal.        Behavior: Behavior normal.     ------------------------------------------------------------------------------------------------------------------------------------------------------------------------------------------------------------------- Assessment and Plan  Type 2 diabetes mellitus with chronic kidney disease, without long-term current use of insulin  ([name]) Diabetes remains well controlled.  Recommend continuation of Tradjenta  at current strength.  Continue losartan  at current strength for associated CKD  Spinal stenosis of lumbar region with neurogenic claudication Continued low back pain and neurogenic claudication symptoms. Neurosurgery has recommended surgery but he doesn't feel that he wants to have surgery at this time. Adding lyrica  to help with pain control.   Hypertension associated with diabetes ([name]) Blood pressure is well-controlled at this time.  Will plan to continue current medications.  History  of CVA (cerebrovascular accident) Continue secondary prevention with statin.  He is anticoagulated with Eliquis  as he has history of a. Flutter/fib.   Dyslipidemia associated with type 2 diabetes mellitus ([name]) Continue atorvastatin  at current strength.    Meds ordered this encounter  Medications   pregabalin  (LYRICA ) 75 MG capsule    Sig: Take 1 capsule (75 mg total) by mouth 2 (two) times daily.    Dispense:  60 capsule    Refill:  3   methocarbamol  (ROBAXIN ) 500 MG tablet    Sig: Take 1 tablet (500 mg total) by mouth 3 (three) times daily.    Dispense:  90 tablet    Refill:  0    Return in about 6 weeks (around 03/28/2024) for Neck.        [1]  Allergies Allergen Reactions   Aspirin Swelling   Doxepin  Other (See Comments)    Dry mouth    Iodine    Shellfish Allergy Swelling   Dog Epithelium (Canis Lupus Familiaris) Rash   Sertraline  Diarrhea   "

## 2024-02-15 NOTE — Assessment & Plan Note (Signed)
 Continue atorvastatin at current strength.

## 2024-02-15 NOTE — Assessment & Plan Note (Signed)
 Continued low back pain and neurogenic claudication symptoms. Neurosurgery has recommended surgery but he doesn't feel that he wants to have surgery at this time. Adding lyrica  to help with pain control.

## 2024-02-15 NOTE — Assessment & Plan Note (Signed)
 Blood pressure is well-controlled at this time.  Will plan to continue current medications.

## 2024-02-15 NOTE — Assessment & Plan Note (Signed)
 Continue secondary prevention with statin.  He is anticoagulated with Eliquis  as he has history of a. Flutter/fib.

## 2024-02-29 ENCOUNTER — Ambulatory Visit: Payer: Self-pay | Admitting: Podiatry

## 2024-02-29 DIAGNOSIS — Z91199 Patient's noncompliance with other medical treatment and regimen due to unspecified reason: Secondary | ICD-10-CM

## 2024-02-29 NOTE — Progress Notes (Signed)
 No show

## 2024-03-02 ENCOUNTER — Inpatient Hospital Stay: Admitting: Family Medicine

## 2024-03-14 ENCOUNTER — Other Ambulatory Visit: Payer: Self-pay | Admitting: Family Medicine

## 2024-03-18 ENCOUNTER — Other Ambulatory Visit: Payer: Self-pay | Admitting: Family Medicine

## 2024-03-18 DIAGNOSIS — F332 Major depressive disorder, recurrent severe without psychotic features: Secondary | ICD-10-CM

## 2024-03-21 ENCOUNTER — Inpatient Hospital Stay: Admitting: Family Medicine

## 2024-03-22 ENCOUNTER — Ambulatory Visit: Admitting: Physical Therapy

## 2024-03-28 ENCOUNTER — Ambulatory Visit: Admitting: Family Medicine
